# Patient Record
Sex: Male | Born: 1945 | Race: Black or African American | Hispanic: No | Marital: Single | Smoking: Never smoker
Health system: Southern US, Community
[De-identification: ages and names within clinical notes are randomized; demographics above are authoritative.]

## PROBLEM LIST (undated history)

## (undated) DIAGNOSIS — R1310 Dysphagia, unspecified: Secondary | ICD-10-CM

## (undated) DIAGNOSIS — N289 Disorder of kidney and ureter, unspecified: Secondary | ICD-10-CM

## (undated) DIAGNOSIS — F209 Schizophrenia, unspecified: Secondary | ICD-10-CM

## (undated) DIAGNOSIS — H269 Unspecified cataract: Secondary | ICD-10-CM

## (undated) DIAGNOSIS — J189 Pneumonia, unspecified organism: Secondary | ICD-10-CM

## (undated) DIAGNOSIS — R001 Bradycardia, unspecified: Secondary | ICD-10-CM

## (undated) DIAGNOSIS — G8250 Quadriplegia, unspecified: Secondary | ICD-10-CM

## (undated) DIAGNOSIS — I10 Essential (primary) hypertension: Secondary | ICD-10-CM

## (undated) HISTORY — PX: OTHER SURGICAL HISTORY: SHX169

---

## 2001-03-15 ENCOUNTER — Ambulatory Visit (HOSPITAL_COMMUNITY): Admission: RE | Admit: 2001-03-15 | Discharge: 2001-03-15 | Payer: Self-pay | Admitting: Internal Medicine

## 2001-03-15 ENCOUNTER — Encounter: Payer: Self-pay | Admitting: Internal Medicine

## 2008-05-09 ENCOUNTER — Ambulatory Visit (HOSPITAL_COMMUNITY): Admission: RE | Admit: 2008-05-09 | Discharge: 2008-05-09 | Payer: Self-pay | Admitting: Gastroenterology

## 2008-05-09 ENCOUNTER — Ambulatory Visit: Payer: Self-pay | Admitting: Gastroenterology

## 2011-02-02 NOTE — Op Note (Signed)
NAME:  COMPTON, BRIGANCE NO.:  [id number]   MEDICAL RECORD NO.:  [id number]          PATIENT TYPE:  AMB   LOCATION:  DAY                           FACILITY:  [facility]   PHYSICIAN:  Kassie Mends, M.D.      DATE OF BIRTH:  1946/02/15   DATE OF PROCEDURE:  05/09/2008  DATE OF DISCHARGE:                               OPERATIVE REPORT   REFERRING PHYSICIAN:  Tesfaye D. Felecia Shelling, MD.   PROCEDURE:  Colonoscopy.   INDICATION FOR EXAM:  Dean Oliver is a 65 year old male who presents for  average risk colon cancer screening.   FINDINGS:  Extremely tortuous colon requiring multiple changes in  position and pressure to achieve successful intubation of the cecum.  Otherwise no polyps, masses, inflammatory changes, diverticula, or AVMs  seen.  Normal retroflexed view of the rectum.   RECOMMENDATIONS:  1. Screening colonoscopy in 10 years.  2. He should follow a high-fiber diet.  He is given a handout on high-      fiber diet.   MEDICATIONS:  1. Demerol 75 mg IV.  2. Versed 4 mg IV.   PROCEDURE TECHNIQUE:  Physical exam was performed.  Informed consent was  obtained from the patient.  I explained the benefits, risks, and  alternatives to the procedure.  The patient was connected to the monitor  and placed in the left lateral position.  Continuous oxygen was provided  by nasal cannula, and IV medicine administered through an indwelling  cannula.  After administration of sedation and rectal exam, the  patient's rectum was intubated  and the scope was advanced under direct visualization to the cecum.  The  scope was removed slowly by carefully examining the color, texture,  anatomy, and integrity of the mucosa on the way out.  The patient was  recovered in endoscopy and discharged home in satisfactory condition.      Kassie Mends, M.D.  Electronically Signed     SM/MEDQ  D:  05/09/2008  T:  05/09/2008  Job:  [id number]   cc:   Tesfaye D. Felecia Shelling, MD  Fax: [phone]

## 2015-03-31 DIAGNOSIS — F209 Schizophrenia, unspecified: Secondary | ICD-10-CM | POA: Diagnosis not present

## 2015-03-31 DIAGNOSIS — M549 Dorsalgia, unspecified: Secondary | ICD-10-CM | POA: Diagnosis not present

## 2015-03-31 DIAGNOSIS — R569 Unspecified convulsions: Secondary | ICD-10-CM | POA: Diagnosis not present

## 2015-03-31 DIAGNOSIS — I10 Essential (primary) hypertension: Secondary | ICD-10-CM | POA: Diagnosis not present

## 2015-04-08 DIAGNOSIS — H4011X3 Primary open-angle glaucoma, severe stage: Secondary | ICD-10-CM | POA: Diagnosis not present

## 2015-04-08 DIAGNOSIS — H2523 Age-related cataract, morgagnian type, bilateral: Secondary | ICD-10-CM | POA: Diagnosis not present

## 2015-06-30 DIAGNOSIS — Z23 Encounter for immunization: Secondary | ICD-10-CM | POA: Diagnosis not present

## 2015-06-30 DIAGNOSIS — F209 Schizophrenia, unspecified: Secondary | ICD-10-CM | POA: Diagnosis not present

## 2015-06-30 DIAGNOSIS — E785 Hyperlipidemia, unspecified: Secondary | ICD-10-CM | POA: Diagnosis not present

## 2015-06-30 DIAGNOSIS — I10 Essential (primary) hypertension: Secondary | ICD-10-CM | POA: Diagnosis not present

## 2015-06-30 DIAGNOSIS — R569 Unspecified convulsions: Secondary | ICD-10-CM | POA: Diagnosis not present

## 2015-07-15 DIAGNOSIS — H401133 Primary open-angle glaucoma, bilateral, severe stage: Secondary | ICD-10-CM | POA: Diagnosis not present

## 2015-10-07 DIAGNOSIS — I10 Essential (primary) hypertension: Secondary | ICD-10-CM | POA: Diagnosis not present

## 2015-10-07 DIAGNOSIS — F209 Schizophrenia, unspecified: Secondary | ICD-10-CM | POA: Diagnosis not present

## 2015-10-07 DIAGNOSIS — R569 Unspecified convulsions: Secondary | ICD-10-CM | POA: Diagnosis not present

## 2016-01-19 DIAGNOSIS — R634 Abnormal weight loss: Secondary | ICD-10-CM | POA: Diagnosis not present

## 2016-01-19 DIAGNOSIS — F209 Schizophrenia, unspecified: Secondary | ICD-10-CM | POA: Diagnosis not present

## 2016-01-19 DIAGNOSIS — E785 Hyperlipidemia, unspecified: Secondary | ICD-10-CM | POA: Diagnosis not present

## 2016-01-19 DIAGNOSIS — Z0000 Encounter for general adult medical examination without abnormal findings: Secondary | ICD-10-CM | POA: Diagnosis not present

## 2016-01-19 DIAGNOSIS — R569 Unspecified convulsions: Secondary | ICD-10-CM | POA: Diagnosis not present

## 2016-01-19 DIAGNOSIS — I10 Essential (primary) hypertension: Secondary | ICD-10-CM | POA: Diagnosis not present

## 2016-02-03 DIAGNOSIS — H401133 Primary open-angle glaucoma, bilateral, severe stage: Secondary | ICD-10-CM | POA: Diagnosis not present

## 2016-04-22 DIAGNOSIS — F209 Schizophrenia, unspecified: Secondary | ICD-10-CM | POA: Diagnosis not present

## 2016-04-22 DIAGNOSIS — R569 Unspecified convulsions: Secondary | ICD-10-CM | POA: Diagnosis not present

## 2016-04-22 DIAGNOSIS — I10 Essential (primary) hypertension: Secondary | ICD-10-CM | POA: Diagnosis not present

## 2016-05-11 DIAGNOSIS — H401133 Primary open-angle glaucoma, bilateral, severe stage: Secondary | ICD-10-CM | POA: Diagnosis not present

## 2016-08-04 DIAGNOSIS — Z23 Encounter for immunization: Secondary | ICD-10-CM | POA: Diagnosis not present

## 2016-08-10 DIAGNOSIS — H401133 Primary open-angle glaucoma, bilateral, severe stage: Secondary | ICD-10-CM | POA: Diagnosis not present

## 2016-08-16 DIAGNOSIS — F209 Schizophrenia, unspecified: Secondary | ICD-10-CM | POA: Diagnosis not present

## 2016-08-16 DIAGNOSIS — I10 Essential (primary) hypertension: Secondary | ICD-10-CM | POA: Diagnosis not present

## 2016-08-16 DIAGNOSIS — R569 Unspecified convulsions: Secondary | ICD-10-CM | POA: Diagnosis not present

## 2016-09-22 DIAGNOSIS — H2513 Age-related nuclear cataract, bilateral: Secondary | ICD-10-CM | POA: Diagnosis not present

## 2016-09-22 DIAGNOSIS — H401133 Primary open-angle glaucoma, bilateral, severe stage: Secondary | ICD-10-CM | POA: Diagnosis not present

## 2016-10-14 DIAGNOSIS — H2513 Age-related nuclear cataract, bilateral: Secondary | ICD-10-CM | POA: Diagnosis not present

## 2016-10-14 DIAGNOSIS — H401133 Primary open-angle glaucoma, bilateral, severe stage: Secondary | ICD-10-CM | POA: Diagnosis not present

## 2016-10-25 DIAGNOSIS — F209 Schizophrenia, unspecified: Secondary | ICD-10-CM | POA: Diagnosis not present

## 2016-10-25 DIAGNOSIS — I10 Essential (primary) hypertension: Secondary | ICD-10-CM | POA: Diagnosis not present

## 2016-10-25 DIAGNOSIS — R569 Unspecified convulsions: Secondary | ICD-10-CM | POA: Diagnosis not present

## 2016-11-11 DIAGNOSIS — H2511 Age-related nuclear cataract, right eye: Secondary | ICD-10-CM | POA: Diagnosis not present

## 2016-11-11 DIAGNOSIS — H401113 Primary open-angle glaucoma, right eye, severe stage: Secondary | ICD-10-CM | POA: Diagnosis not present

## 2017-01-20 DIAGNOSIS — F70 Mild intellectual disabilities: Secondary | ICD-10-CM | POA: Diagnosis not present

## 2017-01-20 DIAGNOSIS — F99 Mental disorder, not otherwise specified: Secondary | ICD-10-CM | POA: Diagnosis not present

## 2017-01-21 DIAGNOSIS — R739 Hyperglycemia, unspecified: Secondary | ICD-10-CM | POA: Diagnosis not present

## 2017-01-21 DIAGNOSIS — I10 Essential (primary) hypertension: Secondary | ICD-10-CM | POA: Diagnosis not present

## 2017-01-21 DIAGNOSIS — Z0000 Encounter for general adult medical examination without abnormal findings: Secondary | ICD-10-CM | POA: Diagnosis not present

## 2017-01-21 DIAGNOSIS — R569 Unspecified convulsions: Secondary | ICD-10-CM | POA: Diagnosis not present

## 2017-01-21 DIAGNOSIS — F0390 Unspecified dementia without behavioral disturbance: Secondary | ICD-10-CM | POA: Diagnosis not present

## 2017-01-21 DIAGNOSIS — F209 Schizophrenia, unspecified: Secondary | ICD-10-CM | POA: Diagnosis not present

## 2017-01-21 DIAGNOSIS — E785 Hyperlipidemia, unspecified: Secondary | ICD-10-CM | POA: Diagnosis not present

## 2017-05-12 DIAGNOSIS — F70 Mild intellectual disabilities: Secondary | ICD-10-CM | POA: Diagnosis not present

## 2017-06-08 DIAGNOSIS — Z961 Presence of intraocular lens: Secondary | ICD-10-CM | POA: Diagnosis not present

## 2017-06-08 DIAGNOSIS — H401133 Primary open-angle glaucoma, bilateral, severe stage: Secondary | ICD-10-CM | POA: Diagnosis not present

## 2017-06-08 DIAGNOSIS — H2512 Age-related nuclear cataract, left eye: Secondary | ICD-10-CM | POA: Diagnosis not present

## 2017-06-10 DIAGNOSIS — G40901 Epilepsy, unspecified, not intractable, with status epilepticus: Secondary | ICD-10-CM | POA: Diagnosis not present

## 2017-06-10 DIAGNOSIS — I10 Essential (primary) hypertension: Secondary | ICD-10-CM | POA: Diagnosis not present

## 2017-06-10 DIAGNOSIS — Z23 Encounter for immunization: Secondary | ICD-10-CM | POA: Diagnosis not present

## 2017-06-10 DIAGNOSIS — F209 Schizophrenia, unspecified: Secondary | ICD-10-CM | POA: Diagnosis not present

## 2017-06-23 DIAGNOSIS — H401133 Primary open-angle glaucoma, bilateral, severe stage: Secondary | ICD-10-CM | POA: Diagnosis not present

## 2017-06-30 DIAGNOSIS — H2512 Age-related nuclear cataract, left eye: Secondary | ICD-10-CM | POA: Diagnosis not present

## 2017-07-28 DIAGNOSIS — F70 Mild intellectual disabilities: Secondary | ICD-10-CM | POA: Diagnosis not present

## 2017-08-12 ENCOUNTER — Emergency Department (HOSPITAL_COMMUNITY): Payer: Medicare Other

## 2017-08-12 ENCOUNTER — Encounter (HOSPITAL_COMMUNITY)
Admission: EM | Disposition: A | Discharge status: Rehabilitation Facility | Payer: Self-pay | Source: Home / Self Care | Attending: Internal Medicine

## 2017-08-12 ENCOUNTER — Encounter (HOSPITAL_COMMUNITY): Payer: Self-pay | Admitting: Emergency Medicine

## 2017-08-12 ENCOUNTER — Inpatient Hospital Stay (HOSPITAL_COMMUNITY)
Admission: EM | Admit: 2017-08-12 | Discharge: 2017-08-17 | DRG: 052 | Disposition: A | Discharge status: Rehabilitation Facility | Payer: Medicare Other | Attending: Internal Medicine | Admitting: Internal Medicine

## 2017-08-12 ENCOUNTER — Inpatient Hospital Stay (HOSPITAL_COMMUNITY): Payer: Medicare Other

## 2017-08-12 DIAGNOSIS — I10 Essential (primary) hypertension: Secondary | ICD-10-CM | POA: Diagnosis not present

## 2017-08-12 DIAGNOSIS — M25552 Pain in left hip: Secondary | ICD-10-CM | POA: Diagnosis not present

## 2017-08-12 DIAGNOSIS — G8250 Quadriplegia, unspecified: Secondary | ICD-10-CM | POA: Diagnosis present

## 2017-08-12 DIAGNOSIS — R001 Bradycardia, unspecified: Secondary | ICD-10-CM | POA: Diagnosis not present

## 2017-08-12 DIAGNOSIS — M25512 Pain in left shoulder: Secondary | ICD-10-CM | POA: Diagnosis not present

## 2017-08-12 DIAGNOSIS — D62 Acute posthemorrhagic anemia: Secondary | ICD-10-CM

## 2017-08-12 DIAGNOSIS — H269 Unspecified cataract: Secondary | ICD-10-CM | POA: Diagnosis present

## 2017-08-12 DIAGNOSIS — N319 Neuromuscular dysfunction of bladder, unspecified: Secondary | ICD-10-CM | POA: Diagnosis present

## 2017-08-12 DIAGNOSIS — S14122A Central cord syndrome at C2 level of cervical spinal cord, initial encounter: Secondary | ICD-10-CM | POA: Diagnosis not present

## 2017-08-12 DIAGNOSIS — E871 Hypo-osmolality and hyponatremia: Secondary | ICD-10-CM | POA: Diagnosis not present

## 2017-08-12 DIAGNOSIS — R279 Unspecified lack of coordination: Secondary | ICD-10-CM | POA: Diagnosis not present

## 2017-08-12 DIAGNOSIS — I959 Hypotension, unspecified: Secondary | ICD-10-CM | POA: Diagnosis not present

## 2017-08-12 DIAGNOSIS — M4802 Spinal stenosis, cervical region: Secondary | ICD-10-CM | POA: Diagnosis present

## 2017-08-12 DIAGNOSIS — Z7401 Bed confinement status: Secondary | ICD-10-CM | POA: Diagnosis not present

## 2017-08-12 DIAGNOSIS — R531 Weakness: Secondary | ICD-10-CM | POA: Diagnosis not present

## 2017-08-12 DIAGNOSIS — M5000 Cervical disc disorder with myelopathy, unspecified cervical region: Secondary | ICD-10-CM | POA: Diagnosis present

## 2017-08-12 DIAGNOSIS — R404 Transient alteration of awareness: Secondary | ICD-10-CM | POA: Diagnosis not present

## 2017-08-12 DIAGNOSIS — F209 Schizophrenia, unspecified: Secondary | ICD-10-CM | POA: Insufficient documentation

## 2017-08-12 DIAGNOSIS — R339 Retention of urine, unspecified: Secondary | ICD-10-CM | POA: Diagnosis not present

## 2017-08-12 DIAGNOSIS — F203 Undifferentiated schizophrenia: Secondary | ICD-10-CM | POA: Diagnosis not present

## 2017-08-12 DIAGNOSIS — W19XXXA Unspecified fall, initial encounter: Secondary | ICD-10-CM | POA: Diagnosis present

## 2017-08-12 DIAGNOSIS — R55 Syncope and collapse: Secondary | ICD-10-CM

## 2017-08-12 DIAGNOSIS — R1310 Dysphagia, unspecified: Secondary | ICD-10-CM

## 2017-08-12 HISTORY — DX: Schizophrenia, unspecified: F20.9

## 2017-08-12 HISTORY — DX: Unspecified cataract: H26.9

## 2017-08-12 HISTORY — DX: Essential (primary) hypertension: I10

## 2017-08-12 LAB — I-STAT TROPONIN, ED: Troponin i, poc: 0 ng/mL (ref 0.00–0.08)

## 2017-08-12 LAB — CBC WITH DIFFERENTIAL/PLATELET
Basophils Absolute: 0 10*3/uL (ref 0.0–0.1)
Basophils Relative: 0 %
EOS ABS: 0 10*3/uL (ref 0.0–0.7)
EOS PCT: 1 %
HCT: 35 % — ABNORMAL LOW (ref 39.0–52.0)
HEMOGLOBIN: 10.8 g/dL — AB (ref 13.0–17.0)
LYMPHS ABS: 1.5 10*3/uL (ref 0.7–4.0)
Lymphocytes Relative: 37 %
MCH: 27.8 pg (ref 26.0–34.0)
MCHC: 30.9 g/dL (ref 30.0–36.0)
MCV: 90 fL (ref 78.0–100.0)
MONO ABS: 0.3 10*3/uL (ref 0.1–1.0)
MONOS PCT: 7 %
NEUTROS PCT: 55 %
Neutro Abs: 2.3 10*3/uL (ref 1.7–7.7)
Platelets: 220 10*3/uL (ref 150–400)
RBC: 3.89 MIL/uL — ABNORMAL LOW (ref 4.22–5.81)
RDW: 13.2 % (ref 11.5–15.5)
WBC: 4.1 10*3/uL (ref 4.0–10.5)

## 2017-08-12 LAB — COMPREHENSIVE METABOLIC PANEL
ALK PHOS: 73 U/L (ref 38–126)
ALT: 13 U/L — AB (ref 17–63)
AST: 20 U/L (ref 15–41)
Albumin: 3.8 g/dL (ref 3.5–5.0)
Anion gap: 5 (ref 5–15)
BUN: 17 mg/dL (ref 6–20)
CALCIUM: 8.9 mg/dL (ref 8.9–10.3)
CO2: 21 mmol/L — AB (ref 22–32)
Chloride: 111 mmol/L (ref 101–111)
Creatinine, Ser: 1.07 mg/dL (ref 0.61–1.24)
GFR calc Af Amer: >60 mL/min (ref >60)
GFR calc non Af Amer: >60 mL/min (ref >60)
GLUCOSE: 116 mg/dL — AB (ref 65–99)
Potassium: 3.3 mmol/L — ABNORMAL LOW (ref 3.5–5.1)
SODIUM: 137 mmol/L (ref 135–145)
Total Bilirubin: 0.6 mg/dL (ref 0.3–1.2)
Total Protein: 6.7 g/dL (ref 6.5–8.1)

## 2017-08-12 LAB — TROPONIN I: Troponin I: <0.03 ng/mL (ref <0.03)

## 2017-08-12 LAB — I-STAT CHEM 8, ED
BUN: 15 mg/dL (ref 6–20)
CALCIUM ION: 1.04 mmol/L — AB (ref 1.15–1.40)
CHLORIDE: 113 mmol/L — AB (ref 101–111)
Creatinine, Ser: 1 mg/dL (ref 0.61–1.24)
GLUCOSE: 125 mg/dL — AB (ref 65–99)
HCT: 29 % — ABNORMAL LOW (ref 39.0–52.0)
Hemoglobin: 9.9 g/dL — ABNORMAL LOW (ref 13.0–17.0)
Potassium: 3.8 mmol/L (ref 3.5–5.1)
Sodium: 139 mmol/L (ref 135–145)
TCO2: 17 mmol/L — ABNORMAL LOW (ref 22–32)

## 2017-08-12 LAB — I-STAT CG4 LACTIC ACID, ED: LACTIC ACID, VENOUS: 1.42 mmol/L (ref 0.5–1.9)

## 2017-08-12 LAB — CORTISOL: CORTISOL PLASMA: 25.5 ug/dL

## 2017-08-12 LAB — CBC
HCT: 33.1 % — ABNORMAL LOW (ref 39.0–52.0)
Hemoglobin: 10.6 g/dL — ABNORMAL LOW (ref 13.0–17.0)
MCH: 27.6 pg (ref 26.0–34.0)
MCHC: 32 g/dL (ref 30.0–36.0)
MCV: 86.2 fL (ref 78.0–100.0)
PLATELETS: 224 10*3/uL (ref 150–400)
RBC: 3.84 MIL/uL — ABNORMAL LOW (ref 4.22–5.81)
RDW: 13 % (ref 11.5–15.5)
WBC: 7.1 10*3/uL (ref 4.0–10.5)

## 2017-08-12 LAB — TSH: TSH: 4.926 u[IU]/mL — AB (ref 0.350–4.500)

## 2017-08-12 LAB — CREATININE, SERUM
CREATININE: 0.94 mg/dL (ref 0.61–1.24)
GFR calc Af Amer: >60 mL/min (ref >60)
GFR calc non Af Amer: >60 mL/min (ref >60)

## 2017-08-12 LAB — MRSA PCR SCREENING: MRSA by PCR: NEGATIVE

## 2017-08-12 SURGERY — TEMPORARY PACEMAKER
Anesthesia: LOCAL

## 2017-08-12 MED ORDER — NOREPINEPHRINE BITARTRATE 1 MG/ML IV SOLN
0.0000 ug/min | INTRAVENOUS | Status: DC
  Administered 2017-08-12 (×2): 9 ug/min via INTRAVENOUS
  Administered 2017-08-13: 10 ug/min via INTRAVENOUS
  Filled 2017-08-12 (×2): qty 4

## 2017-08-12 MED ORDER — HEPARIN SODIUM (PORCINE) 5000 UNIT/ML IJ SOLN
5000.0000 [IU] | Freq: Three times a day (TID) | INTRAMUSCULAR | Status: DC
  Administered 2017-08-12 – 2017-08-17 (×15): 5000 [IU] via SUBCUTANEOUS
  Filled 2017-08-12 (×15): qty 1

## 2017-08-12 MED ORDER — ONDANSETRON HCL 4 MG/2ML IJ SOLN: 4.0000 mg | Freq: Four times a day (QID) | INTRAMUSCULAR | Status: DC | PRN

## 2017-08-12 MED ORDER — ASPIRIN 81 MG PO CHEW
324.0000 mg | CHEWABLE_TABLET | ORAL | Status: AC
  Administered 2017-08-12: 324 mg via ORAL
  Filled 2017-08-12: qty 4

## 2017-08-12 MED ORDER — ASPIRIN EC 81 MG PO TBEC
81.0000 mg | DELAYED_RELEASE_TABLET | Freq: Every day | ORAL | Status: DC
  Administered 2017-08-13 – 2017-08-17 (×5): 81 mg via ORAL
  Filled 2017-08-12 (×5): qty 1

## 2017-08-12 MED ORDER — ATROPINE SULFATE 1 MG/ML IJ SOLN
INTRAMUSCULAR | Status: AC | PRN
  Administered 2017-08-12: 1 mg via INTRAVENOUS

## 2017-08-12 MED ORDER — NITROGLYCERIN 0.4 MG SL SUBL: 0.4000 mg | SUBLINGUAL_TABLET | SUBLINGUAL | Status: DC | PRN

## 2017-08-12 MED ORDER — ATROPINE SULFATE 1 MG/10ML IJ SOSY
PREFILLED_SYRINGE | INTRAMUSCULAR | Status: AC
  Administered 2017-08-12: 1 mg
  Filled 2017-08-12: qty 10

## 2017-08-12 MED ORDER — ACETAMINOPHEN 325 MG PO TABS
650.0000 mg | ORAL_TABLET | ORAL | Status: DC | PRN
  Administered 2017-08-12 – 2017-08-17 (×6): 650 mg via ORAL
  Filled 2017-08-12 (×6): qty 2

## 2017-08-12 MED ORDER — ALPRAZOLAM 0.25 MG PO TABS: 0.2500 mg | ORAL_TABLET | Freq: Two times a day (BID) | ORAL | Status: DC | PRN

## 2017-08-12 MED ORDER — ZOLPIDEM TARTRATE 5 MG PO TABS: 5.0000 mg | ORAL_TABLET | Freq: Every evening | ORAL | Status: DC | PRN

## 2017-08-12 MED ORDER — ASPIRIN 300 MG RE SUPP: 300.0000 mg | RECTAL | Status: AC

## 2017-08-12 MED ORDER — SODIUM CHLORIDE 0.9 % IV BOLUS (SEPSIS)
1000.0000 mL | Freq: Once | INTRAVENOUS | Status: AC
  Administered 2017-08-12: 1000 mL via INTRAVENOUS

## 2017-08-12 NOTE — Progress Notes (Signed)
   08/12/17 1800  Clinical Encounter Type  Visited With Family  Visit Type Initial  Consult/Referral To Chaplain  Spiritual Encounters  Spiritual Needs Prayer  Stress Factors  Patient Stress Factors None identified  Family Stress Factors Health changes  Chaplain met the family in the ED as they were making their way back to trauma C prior to PT being moved.  Chaplain spoke with doctor and informed family there presence in the room was ok.  Chaplain prayed for the PT and Family

## 2017-08-12 NOTE — ED Notes (Signed)
Levophed started at 10mcg/min

## 2017-08-12 NOTE — ED Notes (Signed)
Levophed rate changed from 49mcg/min to 81mcg/min per Dr Harrington Challenger at bedside

## 2017-08-12 NOTE — ED Notes (Signed)
Pt unable to sign for self at this time.

## 2017-08-12 NOTE — ED Notes (Signed)
ICU RN Evelena Peat riding with pt to [facility].  Good capture maintained.

## 2017-08-12 NOTE — ED Notes (Signed)
Pacing stopped with Barrett, Rhonda at bedside to obtain intrinsic EKG; HR maintained at 73 bpm; pt remains alert and oriented; EKG obtained

## 2017-08-12 NOTE — H&P (Signed)
Cardiology Consultation:   Patient ID: Dean Oliver; [id number]; 1945-09-27   Admit date: 08/12/2017 Date of Consult: 08/12/2017  Primary Care Provider: Dr Legrand Rams Primary Cardiologist: New Primary Electrophysiologist:  New   Patient Profile:   Dean Oliver is a 71 y.o. male with a hx of schizophrenia, HTN, cataracts, who is being seen today for the evaluation of bradycardia at the request of Dr .Gilford Raid.  History of Present Illness:   Dean. Oliver is a 71 yo who lives in a group home  Over the past couple days he says he has been dizzy  Passed out  Details not clear  Patient is a difficult historian  He deneis CP  No N/V  Occasional loose stool  Slightly feverish at times  Family member is here  She says that pt got picked up yesterday at about 10 am for Thanksgiving dinner  Stayed until about 10 pm  One family member says he wasn't himself    Past Medical History:  Diagnosis Date  . Cataracts, bilateral   . HTN (hypertension)   . Schizophrenia [facility])     Past Surgical History:  Procedure Laterality Date  . None       Prior to Admission medications   Not on File    Inpatient Medications: Scheduled Meds:  Continuous Infusions:  PRN Meds:   Allergies:   No Known Allergies  Social History:   Social History   Socioeconomic History  . Marital status: Single    Spouse name: Not on file  . Number of children: Not on file  . Years of education: Not on file  . Highest education level: Not on file  Social Needs  . Financial resource strain: Not on file  . Food insecurity - worry: Not on file  . Food insecurity - inability: Not on file  . Transportation needs - medical: Not on file  . Transportation needs - non-medical: Not on file  Occupational History  . Occupation: Disabled  Tobacco Use  . Smoking status: Unknown If Ever Smoked  Substance and Sexual Activity  . Alcohol use: Not on file  . Drug use: Not on file  . Sexual activity: Not on file  Other  Topics Concern  . Not on file  Social History Narrative   Dean Dean Oliver (uncle) is his POA.    Pt lives in [facility], [phone].    Family History:   No family history on file. Family Status:  Family Status  Relation Name Status  . Mother  Deceased  . Father  Deceased  . PGM  Deceased  . PGF  Deceased    ROS:  Please see the history of present illness.  All other ROS reviewed and negative.     Physical Exam/Data:   Vitals:   08/12/17 1748 08/12/17 1752 08/12/17 1755 08/12/17 1800  BP: 101/66 110/68 105/67 98/67  Pulse: 72 71 73 74  Resp: 18 16 16 14   SpO2: 100% 100% 100% 99%    Intake/Output Summary (Last 24 hours) at 08/12/2017 1810 Last data filed at 08/12/2017 1623 Gross per 24 hour  Intake 1000 ml  Output -  Net 1000 ml   There were no vitals filed for this visit. There is no height or weight on file to calculate BMI.   BP 110/70  Pulse 72  SR   General:  Well nourished, well developed, in no acute distress HEENT: normal  Adentulous  Lymph: no adenopathy Neck: no  JVD  No bruits   Endocrine:  No thryomegaly Vascular: No carotid bruits; 4/4 extremity pulses 2+, without bruits  Cardiac:  normal S1, S2; RRR; no murmur  Lungs:  clear to auscultation bilaterally, no wheezing, rhonchi or rales  Abd: soft, nontender, no hepatomegaly  Ext: no edema  Feet warm   Musculoskeletal:  No deformities, BUE and BLE strength normal and equal Skin: warm and dry  Neuro:  CNs 2-12 intact, no focal abnormalities noted Psych:  Normal affect   EKG:  The EKG was personally reviewed and demonstrates:  S Initial EKG  SB 38 bpm  PR interval 200 msec  QRS 102 msec  Q waves V1, Second EKG (on Levophed)  SR 73 bpm   Telemetry:  Telemetry was personally reviewed and demonstrates:  SR       Laboratory Data:  Chemistry Recent Labs  Lab 08/12/17 1547 08/12/17 1621  NA 137 139  K 3.3* 3.8  CL 111 113*  CO2 21*  --   GLUCOSE 116* 125*  BUN 17 15   CREATININE 1.07 1.00  CALCIUM 8.9  --   GFRNONAA >60  --   GFRAA >60  --   ANIONGAP 5  --     Total Protein  Date Value Ref Range Status  08/12/2017 6.7 6.5 - 8.1 g/dL Final   Albumin  Date Value Ref Range Status  08/12/2017 3.8 3.5 - 5.0 g/dL Final   AST  Date Value Ref Range Status  08/12/2017 20 15 - 41 U/L Final   ALT  Date Value Ref Range Status  08/12/2017 13 (L) 17 - 63 U/L Final   Alkaline Phosphatase  Date Value Ref Range Status  08/12/2017 73 38 - 126 U/L Final   Total Bilirubin  Date Value Ref Range Status  08/12/2017 0.6 0.3 - 1.2 mg/dL Final   Hematology Recent Labs  Lab 08/12/17 1547 08/12/17 1621  WBC 4.1  --   RBC 3.89*  --   HGB 10.8* 9.9*  HCT 35.0* 29.0*  MCV 90.0  --   MCH 27.8  --   MCHC 30.9  --   RDW 13.2  --   PLT 220  --    Cardiac Enzymes Recent Labs  Lab 08/12/17 1547  TROPONINI <0.03    Recent Labs  Lab 08/12/17 1619  TROPIPOC 0.00    BNPNo results for input(s): BNP, PROBNP in the last 168 hours.  DDimer No results for input(s): DDIMER in the last 168 hours. TSH:  Lab Results  Component Value Date   TSH 4.926 (H) 08/12/2017   Lipids:No results found for: CHOL, HDL, LDLCALC, LDLDIRECT, TRIG, CHOLHDL HgbA1c:No results found for: HGBA1C  Radiology/Studies:  Dg Chest Portable 1 View  Result Date: 08/12/2017 CLINICAL DATA:  Hypotension. EXAM: PORTABLE CHEST 1 VIEW COMPARISON:  None. FINDINGS: Mild cardiomegaly is noted. No pneumothorax or pleural effusion is noted. Both lungs are clear. The visualized skeletal structures are unremarkable. IMPRESSION: No acute cardiopulmonary abnormality seen. Electronically Signed   By: Marijo Conception, M.D.   On: 08/12/2017 16:44    Assessment and Plan:   1.  Bradycardia  Pt with narrow complex QRS  Upper normal PR interval  Hypotension appears out of proportion to initial HR  AND hypotension and HR improved with Levophed  (would expect some lowering of HR, not an  increase) COnfusing    No history of any drug use   Called facility  They denied any mixup   UA pending  Will check blood cults  Check cortisol   TSH minimally elevated  Will plan to admit to ICU   Continue current IV pressors and try to wean  If bradycardia returns with hypotension consider PPM   2  Hypotension  As above    3  Hx hypertension  Hold meds    4  Hx schizophrenia  Review meds    Active Problems:   Symptomatic bradycardia     For questions or updates, please contact [facility] Please consult [email or web address] for contact info under Cardiology/STEMI.   Signed, Dorris Carnes, MD  08/12/2017 6:10 PM

## 2017-08-12 NOTE — ED Notes (Signed)
Attempted to locate next of kin to notify with no success.  Pt is alert and questionably oriented at this time.  Sometimes pt answers questions correctly and sometimes not.  Pt is very difficult to understand at times.

## 2017-08-12 NOTE — ED Notes (Signed)
[facility] has no available truck.  RN and ems will transport pt ED to ED.

## 2017-08-12 NOTE — ED Triage Notes (Signed)
Patient from [facility]. Per ems patient is lethargic, synus brady, hypotension. [facility] states that patient had syncopal episode prior to ems arrival.

## 2017-08-12 NOTE — ED Provider Notes (Signed)
[facility] EMERGENCY DEPARTMENT Provider Note   CSN: [id number] Arrival date & time:        History   Chief Complaint Chief Complaint  Patient presents with  . Bradycardia  . Hypotension    HPI Dean Oliver is a 71 y.o. male.  Pt presents to the ED today from [facility].  The pt had a syncopal episode pta.  When EMS arrived, his BP was in the 50s and his HR was in the 30s.  EMS started an IV and came straight here.  The facility is close, so no other interventions were done.  The pt is unable to give any hx.      History reviewed. No pertinent past medical history.  There are no active problems to display for this patient.   History reviewed. No pertinent surgical history.     Home Medications    Prior to Admission medications   Not on File    Family History No family history on file.  Social History Social History   Tobacco Use  . Smoking status: Unknown If Ever Smoked  Substance Use Topics  . Alcohol use: Not on file  . Drug use: Not on file     Allergies   Patient has no known allergies.   Review of Systems Review of Systems  All other systems reviewed and are negative.    Physical Exam Updated Vital Signs BP 110/69   Pulse 72   Resp 19   SpO2 (!) 86%   Physical Exam  Constitutional: He appears well-developed. He appears distressed.  HENT:  Head: Normocephalic and atraumatic.  Right Ear: External ear normal.  Left Ear: External ear normal.  Nose: Nose normal.  Mouth/Throat: Oropharynx is clear and moist.  Eyes: Conjunctivae and EOM are normal. Pupils are equal, round, and reactive to light.  Neck: Normal range of motion. Neck supple.  Cardiovascular: Normal heart sounds. Bradycardia present.  Pulmonary/Chest: Effort normal and breath sounds normal.  Abdominal: Soft. Bowel sounds are normal.  Musculoskeletal: Normal range of motion.  Neurological:  Pt is awake, but somewhat lethargic.  Skin: Skin is warm.  Nursing  note and vitals reviewed.    ED Treatments / Results  Labs (all labs ordered are listed, but only abnormal results are displayed) Labs Reviewed  COMPREHENSIVE METABOLIC PANEL - Abnormal; Notable for the following components:      Result Value   Potassium 3.3 (*)    CO2 21 (*)    Glucose, Bld 116 (*)    ALT 13 (*)    All other components within normal limits  CBC WITH DIFFERENTIAL/PLATELET - Abnormal; Notable for the following components:   RBC 3.89 (*)    Hemoglobin 10.8 (*)    HCT 35.0 (*)    All other components within normal limits  TSH - Abnormal; Notable for the following components:   TSH 4.926 (*)    All other components within normal limits  I-STAT CHEM 8, ED - Abnormal; Notable for the following components:   Chloride 113 (*)    Glucose, Bld 125 (*)    Calcium, Ion 1.04 (*)    TCO2 17 (*)    Hemoglobin 9.9 (*)    HCT 29.0 (*)    All other components within normal limits  TROPONIN I  URINALYSIS, ROUTINE W REFLEX MICROSCOPIC  I-STAT CG4 LACTIC ACID, ED  I-STAT TROPONIN, ED    EKG  EKG Interpretation  Date/Time:  Friday August 12 2017 15:37:52 EST  Ventricular Rate:  38 PR Interval:    QRS Duration: 102 QT Interval:  606 QTC Calculation: 482 R Axis:   47 Text Interpretation:  Sinus bradycardia Borderline ST elevation, anterior leads Borderline prolonged QT interval Confirmed by Isla Pence [phone]) on 08/12/2017 4:01:49 PM       Radiology Dg Chest Portable 1 View  Result Date: 08/12/2017 CLINICAL DATA:  Hypotension. EXAM: PORTABLE CHEST 1 VIEW COMPARISON:  None. FINDINGS: Mild cardiomegaly is noted. No pneumothorax or pleural effusion is noted. Both lungs are clear. The visualized skeletal structures are unremarkable. IMPRESSION: No acute cardiopulmonary abnormality seen. Electronically Signed   By: Marijo Conception, M.D.   On: 08/12/2017 16:44    Procedures Procedures (including critical care time)  Medications Ordered in ED Medications    atropine 1 MG/10ML injection (1 mg  Given 08/12/17 1539)  sodium chloride 0.9 % bolus 1,000 mL (0 mLs Intravenous Stopped 08/12/17 1623)  atropine injection (1 mg Intravenous Given 08/12/17 1534)     Initial Impression / Assessment and Plan / ED Course  I have reviewed the triage vital signs and the nursing notes.  Pertinent labs & imaging results that were available during my care of the patient were reviewed by me and considered in my medical decision making (see chart for details).  Pt does not take any medications that lower BP or HR.    Pt immediately given 1 amp atropine without any change in HR or BP.  Pt placed on transdermal pacer with good capture verified by radial pulse.  BP is slowly coming up.  The pt is becoming more alert.  Unfortunately, there is no way to get a pacemaker here at [facility].  Pt d/w Dr. Harrington Challenger (cardiology) at [facility].  She accepted him for transfer.  I attempted to put a bed request in for patient, but the computer would not allow it.  Another provider also tried, and the same message appeared to her.  Due to the need for patient to get to [facility], she was d/w Dr. Eulis Foster in the ED who accepted him for transfer there until he can be seen by the cardiologist and pacemaker placed.  CRITICAL CARE Performed by: Isla Pence   Total critical care time: 30 minutes  Critical care time was exclusive of separately billable procedures and treating other patients.  Critical care was necessary to treat or prevent imminent or life-threatening deterioration.  Critical care was time spent personally by me on the following activities: development of treatment plan with patient and/or surrogate as well as nursing, discussions with consultants, evaluation of patient's response to treatment, examination of patient, obtaining history from patient or surrogate, ordering and performing treatments and interventions, ordering and review of laboratory studies, ordering and review of  radiographic studies, pulse oximetry and re-evaluation of patient's condition.  Final Clinical Impressions(s) / ED Diagnoses   Final diagnoses:  Sinus bradycardia  Symptomatic bradycardia  Hypotension, unspecified hypotension type  Syncope, unspecified syncope type    ED Discharge Orders    None       Isla Pence, MD 08/12/17 1650

## 2017-08-12 NOTE — ED Notes (Signed)
External pacer applied with capture obtained at 68mA.  Pt tolerating well.  Verified by radial pulse.

## 2017-08-12 NOTE — Progress Notes (Signed)
On assessment, RN noted pt has unequal pupils, the R is larger (4-25mm) and irregularly shaped, non-reactive, L is 2-89mm and reacts appropriately to light, pt does have noted history of cataracts bilaterally. Pt is also complaining of left hip and shoulder/arm soreness and states he has fallen twice in the past two days, family at bedside confirms this. Pt has pronounced weakness, bilaterally, but weaker on the left side, see flowsheets for assessment. Dr. Lamona Curl paged and made aware of findings, orders for xrays at this time. Per MD, think weakness is fall and HR related, so no CT orders at this time. Will continue to monitor closely.

## 2017-08-12 NOTE — ED Notes (Signed)
Radial pulse remains present and strong.

## 2017-08-12 NOTE — ED Notes (Addendum)
Pt arrived to [facility] ED via [place] with [facility] ED RN being ex paced at 70 bpm and capture at 76 mA; pt alert and oriented; 911 was called for dizziness and possible syncopal episode at nursing home; pt found to be bradycardic with HR in 30s and hypotensive; EMS began pacing and [facility] ED started patient on Levophed (currently at 20mcg/min); cardiology PA at bedside

## 2017-08-13 ENCOUNTER — Encounter (HOSPITAL_COMMUNITY): Payer: Self-pay | Admitting: Neurological Surgery

## 2017-08-13 ENCOUNTER — Inpatient Hospital Stay (HOSPITAL_COMMUNITY): Payer: Medicare Other

## 2017-08-13 DIAGNOSIS — G8250 Quadriplegia, unspecified: Secondary | ICD-10-CM | POA: Diagnosis not present

## 2017-08-13 DIAGNOSIS — R29818 Other symptoms and signs involving the nervous system: Secondary | ICD-10-CM | POA: Diagnosis not present

## 2017-08-13 DIAGNOSIS — R001 Bradycardia, unspecified: Secondary | ICD-10-CM | POA: Diagnosis not present

## 2017-08-13 DIAGNOSIS — I959 Hypotension, unspecified: Secondary | ICD-10-CM

## 2017-08-13 DIAGNOSIS — S14129A Central cord syndrome at unspecified level of cervical spinal cord, initial encounter: Secondary | ICD-10-CM | POA: Diagnosis not present

## 2017-08-13 DIAGNOSIS — E871 Hypo-osmolality and hyponatremia: Secondary | ICD-10-CM | POA: Diagnosis not present

## 2017-08-13 DIAGNOSIS — S0990XA Unspecified injury of head, initial encounter: Secondary | ICD-10-CM | POA: Diagnosis not present

## 2017-08-13 DIAGNOSIS — D62 Acute posthemorrhagic anemia: Secondary | ICD-10-CM | POA: Diagnosis not present

## 2017-08-13 DIAGNOSIS — M5000 Cervical disc disorder with myelopathy, unspecified cervical region: Secondary | ICD-10-CM | POA: Diagnosis not present

## 2017-08-13 DIAGNOSIS — S14109A Unspecified injury at unspecified level of cervical spinal cord, initial encounter: Secondary | ICD-10-CM | POA: Diagnosis not present

## 2017-08-13 DIAGNOSIS — S199XXA Unspecified injury of neck, initial encounter: Secondary | ICD-10-CM | POA: Diagnosis not present

## 2017-08-13 DIAGNOSIS — S14122A Central cord syndrome at C2 level of cervical spinal cord, initial encounter: Secondary | ICD-10-CM | POA: Diagnosis not present

## 2017-08-13 LAB — URINALYSIS, ROUTINE W REFLEX MICROSCOPIC
BACTERIA UA: NONE SEEN
Bilirubin Urine: NEGATIVE
GLUCOSE, UA: NEGATIVE mg/dL
Hgb urine dipstick: NEGATIVE
Ketones, ur: NEGATIVE mg/dL
Leukocytes, UA: NEGATIVE
Nitrite: NEGATIVE
PH: 5 (ref 5.0–8.0)
PROTEIN: NEGATIVE mg/dL
SQUAMOUS EPITHELIAL / LPF: NONE SEEN
Specific Gravity, Urine: 1.014 (ref 1.005–1.030)

## 2017-08-13 LAB — CBC
HEMATOCRIT: 32.4 % — AB (ref 39.0–52.0)
HEMOGLOBIN: 10.7 g/dL — AB (ref 13.0–17.0)
MCH: 28.2 pg (ref 26.0–34.0)
MCHC: 33 g/dL (ref 30.0–36.0)
MCV: 85.5 fL (ref 78.0–100.0)
Platelets: 205 10*3/uL (ref 150–400)
RBC: 3.79 MIL/uL — AB (ref 4.22–5.81)
RDW: 13.2 % (ref 11.5–15.5)
WBC: 6.4 10*3/uL (ref 4.0–10.5)

## 2017-08-13 LAB — BASIC METABOLIC PANEL
Anion gap: 6 (ref 5–15)
BUN: 12 mg/dL (ref 6–20)
CALCIUM: 8.1 mg/dL — AB (ref 8.9–10.3)
CO2: 18 mmol/L — ABNORMAL LOW (ref 22–32)
Chloride: 113 mmol/L — ABNORMAL HIGH (ref 101–111)
Creatinine, Ser: 0.76 mg/dL (ref 0.61–1.24)
GFR calc Af Amer: >60 mL/min (ref >60)
GLUCOSE: 112 mg/dL — AB (ref 65–99)
Potassium: 3.1 mmol/L — ABNORMAL LOW (ref 3.5–5.1)
Sodium: 137 mmol/L (ref 135–145)

## 2017-08-13 LAB — PROTIME-INR
INR: 1.27
Prothrombin Time: 15.7 seconds — ABNORMAL HIGH (ref 11.4–15.2)

## 2017-08-13 LAB — TROPONIN I: Troponin I: <0.03 ng/mL (ref <0.03)

## 2017-08-13 LAB — APTT: aPTT: 34 seconds (ref 24–36)

## 2017-08-13 LAB — GLUCOSE, CAPILLARY: GLUCOSE-CAPILLARY: 115 mg/dL — AB (ref 65–99)

## 2017-08-13 LAB — ECHOCARDIOGRAM COMPLETE

## 2017-08-13 MED ORDER — DEXAMETHASONE 4 MG PO TABS
4.0000 mg | ORAL_TABLET | Freq: Four times a day (QID) | ORAL | Status: DC
  Filled 2017-08-13: qty 1

## 2017-08-13 MED ORDER — SODIUM CHLORIDE 0.9 % IV BOLUS (SEPSIS)
1000.0000 mL | INTRAVENOUS | Status: AC
  Administered 2017-08-13: 1000 mL via INTRAVENOUS

## 2017-08-13 MED ORDER — DEXAMETHASONE SODIUM PHOSPHATE 10 MG/ML IJ SOLN
10.0000 mg | Freq: Once | INTRAMUSCULAR | Status: AC
  Administered 2017-08-13: 10 mg via INTRAVENOUS
  Filled 2017-08-13: qty 1

## 2017-08-13 MED ORDER — SODIUM CHLORIDE 0.9 % IV SOLN
INTRAVENOUS | Status: DC
  Administered 2017-08-13 – 2017-08-17 (×5): via INTRAVENOUS

## 2017-08-13 MED ORDER — NOREPINEPHRINE BITARTRATE 1 MG/ML IV SOLN
0.0000 ug/min | INTRAVENOUS | Status: DC
  Administered 2017-08-13: 9 ug/min via INTRAVENOUS
  Filled 2017-08-13 (×2): qty 16

## 2017-08-13 MED ORDER — POTASSIUM CHLORIDE CRYS ER 20 MEQ PO TBCR
40.0000 meq | EXTENDED_RELEASE_TABLET | Freq: Two times a day (BID) | ORAL | Status: DC
  Administered 2017-08-13 – 2017-08-17 (×9): 40 meq via ORAL
  Filled 2017-08-13 (×9): qty 2

## 2017-08-13 NOTE — Progress Notes (Addendum)
S/O: No further improvement of arm or leg strength. CT c-spine completed.  BP 91/60   Pulse (!) 54   Temp 98.3 F (36.8 C)   Resp 18   SpO2 98%   Mental Status: Awake and alert. Answers questions and follows commands.  Cranial Nerves: No facial droop. Mildly hypophonic speech. Head at [place]. Mild dysarthria in context of missing dentition.Weak shoulder shrug at 4-/5 bilaterally.  Motor: RUE: Flaccid tone. 2/5 low amplitude adduction at shoulder. 0/5 shoulder abduction and biceps. Trace triceps. Wrist extension/flexion 0/5. Finger flexors/extensors/intrinsics 0/5.  LUE: Flaccid tone. 2/5 low amplitude adduction at shoulder. 0/5 shoulder abduction. Trace biceps and triceps. Wrist extension/flexion 0/5. Finger flexors/extensors/intrinsics 0/5.  RLE: 0/5 hip flexion, 3/5 knee extension, 0/5 ankle plantar/dorsiflexion LLE: 0/5 hip flexion, 3/5 knee extension, 0/5 ankle plantar/dorsiflexion Sensory: Absent FT sensation to forearms bilaterally; trace FT sensation to hands bilaterally. Pain sensation intact to forearms bilaterally. Absent proprioception to arms bilaterally. Intact FT/pressure to lower extremities bilaterally.  Deep Tendon Reflexes:  Trace right biceps, 0 right brachioradialis. 0 right biceps and brachioradialis. 1+ patellae bilaterally. 0 achlles bilaterally.   CT cervical spine:  -No CT evidence of acute fracture malalignment of the cervical spine. -Pronounced posterior disc osteophyte complex with central posterior disc protrusion at the C2-C3 level (level of cord injury on prior MR) contacts the ventral thecal sac/cord, contributing to acquired stenosis at this level. -Posterior disc osteophyte complex at additional levels of C3-C4, C4-C5, C5-C6 with associated central/ paracentral disc protrusion, as above, also resulting in acquired stenosis. -Mild edema in the retropharyngeal region corresponds to findings on prior MR, favored to represent soft tissue injury in the  absence of fracture. -Carotid vascular disease.  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 MRI reveals acute C2-3 spinal cord contusion 1. On follow up interview, patient answers affirmatively that weakness began acutely after the fall and that it was not present prior to the fall; this is reaffirmed after asking the same questions again after a delay. He was using arms/hands normally at home on Thursday at Thanksgiving dinner per family.  2. Neurosurgery does not feel that he is a candidate for early surgical intervention. Per their note, risks of steroid treatment outweigh potential benefits.  3. Exam findings most consistent with a central cord syndrome 4. Cervical collar 5. PT/OT as tolerated 6. Frequent neuro checks 7. Avoid hypotension.  8. Neurogenic bladder. Continue Foley.   Electronically signed: Dr. Kerney Elbe

## 2017-08-13 NOTE — Progress Notes (Signed)
Patient has had no urine output since admission to [facility] around 1800. Bladder scan shows >700cc urine retained. Pt has no "urge to go." Text page to Dr. Lamona Curl, orders received. Will implement and continue to monitor.

## 2017-08-13 NOTE — Progress Notes (Addendum)
Subjective:  Nurse indicates inability to move arms this am. Has had CT/MRI neurology Seeing cardiac status stable no symptoms and HR/BP ok  Objective:  Vitals:   08/13/17 1015 08/13/17 1030 08/13/17 1045 08/13/17 1100  BP: (!) 116/58 (!) 105/53 (!) 103/55   Pulse: (!) 52 (!) 50 (!) 52 (!) 53  Resp: 15 13 13 15   Temp:      TempSrc:      SpO2: 99% 99% 99% 98%    Intake/Output from previous day:  Intake/Output Summary (Last 24 hours) at 08/13/2017 1155 Last data filed at 08/13/2017 1000 Gross per 24 hour  Intake 2406.1 ml  Output 1000 ml  Net 1406.1 ml    Physical Exam: Affect appropriate Thin black male  HEENT: normal Neck supple with no adenopathy JVP normal no bruits no thyromegaly Lungs clear with no wheezing and good diaphragmatic motion Heart:  S1/S2 no murmur, no rub, gallop or click PMI normal Abdomen: benighn, BS positve, no tenderness, no AAA no bruit.  No HSM or HJR Distal pulses intact with no bruits No edema Neuro non-focal Skin warm and dry Motor deficits in UE;s sensory ok    Lab Results: Basic Metabolic Panel: Recent Labs    08/12/17 1547 08/12/17 1621 08/12/17 1920 08/13/17 0824  NA 137 139  --  137  K 3.3* 3.8  --  3.1*  CL 111 113*  --  113*  CO2 21*  --   --  18*  GLUCOSE 116* 125*  --  112*  BUN 17 15  --  12  CREATININE 1.07 1.00 0.94 0.76  CALCIUM 8.9  --   --  8.1*   Liver Function Tests: Recent Labs    08/12/17 1547  AST 20  ALT 13*  ALKPHOS 73  BILITOT 0.6  PROT 6.7  ALBUMIN 3.8   No results for input(s): LIPASE, AMYLASE in the last 72 hours. CBC: Recent Labs    08/12/17 1547  08/12/17 1920 08/13/17 0824  WBC 4.1  --  7.1 6.4  NEUTROABS 2.3  --   --   --   HGB 10.8*   < > 10.6* 10.7*  HCT 35.0*   < > 33.1* 32.4*  MCV 90.0  --  86.2 85.5  PLT 220  --  224 205   < > = values in this interval not displayed.   Cardiac Enzymes: Recent Labs    08/12/17 1920 08/13/17 0547 08/13/17 0824  TROPONINI <0.03  <0.03 <0.03   BNP: Invalid input(s): POCBNP D-Dimer: No results for input(s): DDIMER in the last 72 hours. Hemoglobin A1C: No results for input(s): HGBA1C in the last 72 hours. Fasting Lipid Panel: No results for input(s): CHOL, HDL, LDLCALC, TRIG, CHOLHDL, LDLDIRECT in the last 72 hours. Thyroid Function Tests: Recent Labs    08/12/17 1547  TSH 4.926*   Anemia Panel: No results for input(s): VITAMINB12, FOLATE, FERRITIN, TIBC, IRON, RETICCTPCT in the last 72 hours.  Imaging: Mr Brain Wo Contrast  Result Date: 08/13/2017 CLINICAL DATA:  Weakness in all 4 extremities. Symptomatic bradycardia with hypotension. Syncope. Recent falls. EXAM: MRI HEAD WITHOUT CONTRAST MRI CERVICAL SPINE WITHOUT CONTRAST TECHNIQUE: Multiplanar, multiecho pulse sequences of the brain and surrounding structures, and cervical spine, to include the craniocervical junction and cervicothoracic junction, were obtained without intravenous contrast. COMPARISON:  Head CT 08/13/2017 FINDINGS: MRI HEAD FINDINGS At the request of the stroke neurologist, only axial and coronal diffusion and axial FLAIR imaging was performed. There is no  restricted diffusion to indicate acute infarct. The ventricles and sulci are normal. Small T2 hyperintense focus in the left lentiform nucleus is without evidence of significant associated gliosis on FLAIR imaging, favoring a dilated perivascular space over chronic lacunar infarct. No significant cerebral white matter disease is seen for age. No intracranial mass effect or extra-axial fluid collection is identified. MRI CERVICAL SPINE FINDINGS The study is mildly motion degraded. Alignment: Trace retrolisthesis of C4 on C5. Vertebrae: There is abnormal marrow edema throughout much of the C4 and C5 vertebral bodies as well as along the C6 superior endplate. There is mild anterior wedging of the C5 greater than C6 vertebral bodies, and there is also mild central depression of the C4 inferior  endplate. There is some T2/STIR signal hyperintensity in the C5-6 disc space which may be traumatic or degenerative, without endplate erosion to specifically suggest infection. Cord: Extensive T2 hyperintensity involving nearly the entire cross-section of the cord at C2-3. Milder T2 hyperintensity is suspected in the cord at C3-4. There is increased trace diffusion signal in the cord at C2-3, however this is favored to reflect T2 shine through rather than acute infarct given the lack of reduced ADC and the configuration/extensiveness of the cord T2 signal abnormality without preferential gray matter involvement. Posterior Fossa, vertebral arteries, paraspinal tissues: Grossly preserved vertebral artery flow voids. Mild prevertebral edema/fluid from C2-C4. No significant edema suggestive of of posterior ligamentous injury. No discrete anterior or posterior longitudinal ligament disruption is identified. 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 C2-3: Small right central disc protrusion and infolding of the ligamentum flavum result in moderate spinal stenosis with mild cord flattening and mild-to-moderate bilateral neural foraminal stenosis. C3-4: Disc bulging, ankle vertebral spurring, and infolding of the ligamentum flavum result in moderate spinal stenosis and moderate right and mild left neural foraminal stenosis. C4-5: Disc bulging, uncovertebral spurring, and mild infolding of the ligamentum flavum result in moderate spinal stenosis and severe right and mild-to-moderate left neural foraminal stenosis. C5-6: Mild disc bulging and uncovertebral spurring result in moderate to severe right and moderate left neural foraminal stenosis and borderline to mild spinal stenosis. C6-7: Small central disc protrusion without spinal stenosis. Mild bilateral neural foraminal narrowing due to uncovertebral spurring. C7-T1:  Negative. IMPRESSION: 1. Spinal cord edema at C2-3, suspicious  for traumatic contusion in the setting of recent falls and congenital and acquired spinal stenosis at this level. This is not felt to be consistent with infarct, although there could be a component of underlying chronic myelopathy secondary to degenerative stenosis. 2. Mild prevertebral edema from C2-C4 also likely related to recent trauma. Vertebral marrow edema from C4-C6 with mild vertebral body wedging at these levels may also be reflective of acute fractures versus degenerative edema. Cervical spine CT is recommended to further assess for fracture. 3. Moderate congenital and acquired spinal stenosis from C2-3 to C4-5. Moderate to severe multilevel neural foraminal stenosis as above. These results were called by telephone at the time of interpretation on 08/13/2017 at 9:23 am to Dr. Cheral Marker, who verbally acknowledged these results. Electronically Signed   By: Logan Bores M.D.   On: 08/13/2017 09:33   Mr Cervical Spine Wo Contrast  Result Date: 08/13/2017 CLINICAL DATA:  Weakness in all 4 extremities. Symptomatic bradycardia with hypotension. Syncope. Recent falls. EXAM: MRI HEAD WITHOUT CONTRAST MRI CERVICAL SPINE WITHOUT CONTRAST TECHNIQUE: Multiplanar, multiecho pulse sequences of the brain and surrounding structures, and cervical spine, to include the craniocervical junction and cervicothoracic  junction, were obtained without intravenous contrast. COMPARISON:  Head CT 08/13/2017 FINDINGS: MRI HEAD FINDINGS At the request of the stroke neurologist, only axial and coronal diffusion and axial FLAIR imaging was performed. There is no restricted diffusion to indicate acute infarct. The ventricles and sulci are normal. Small T2 hyperintense focus in the left lentiform nucleus is without evidence of significant associated gliosis on FLAIR imaging, favoring a dilated perivascular space over chronic lacunar infarct. No significant cerebral white matter disease is seen for age. No intracranial mass effect or  extra-axial fluid collection is identified. MRI CERVICAL SPINE FINDINGS The study is mildly motion degraded. Alignment: Trace retrolisthesis of C4 on C5. Vertebrae: There is abnormal marrow edema throughout much of the C4 and C5 vertebral bodies as well as along the C6 superior endplate. There is mild anterior wedging of the C5 greater than C6 vertebral bodies, and there is also mild central depression of the C4 inferior endplate. There is some T2/STIR signal hyperintensity in the C5-6 disc space which may be traumatic or degenerative, without endplate erosion to specifically suggest infection. Cord: Extensive T2 hyperintensity involving nearly the entire cross-section of the cord at C2-3. Milder T2 hyperintensity is suspected in the cord at C3-4. There is increased trace diffusion signal in the cord at C2-3, however this is favored to reflect T2 shine through rather than acute infarct given the lack of reduced ADC and the configuration/extensiveness of the cord T2 signal abnormality without preferential gray matter involvement. Posterior Fossa, vertebral arteries, paraspinal tissues: Grossly preserved vertebral artery flow voids. Mild prevertebral edema/fluid from C2-C4. No significant edema suggestive of of posterior ligamentous injury. No discrete anterior or posterior longitudinal ligament disruption is identified. 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 C2-3: Small right central disc protrusion and infolding of the ligamentum flavum result in moderate spinal stenosis with mild cord flattening and mild-to-moderate bilateral neural foraminal stenosis. C3-4: Disc bulging, ankle vertebral spurring, and infolding of the ligamentum flavum result in moderate spinal stenosis and moderate right and mild left neural foraminal stenosis. C4-5: Disc bulging, uncovertebral spurring, and mild infolding of the ligamentum flavum result in moderate spinal stenosis and severe right and  mild-to-moderate left neural foraminal stenosis. C5-6: Mild disc bulging and uncovertebral spurring result in moderate to severe right and moderate left neural foraminal stenosis and borderline to mild spinal stenosis. C6-7: Small central disc protrusion without spinal stenosis. Mild bilateral neural foraminal narrowing due to uncovertebral spurring. C7-T1:  Negative. IMPRESSION: 1. Spinal cord edema at C2-3, suspicious for traumatic contusion in the setting of recent falls and congenital and acquired spinal stenosis at this level. This is not felt to be consistent with infarct, although there could be a component of underlying chronic myelopathy secondary to degenerative stenosis. 2. Mild prevertebral edema from C2-C4 also likely related to recent trauma. Vertebral marrow edema from C4-C6 with mild vertebral body wedging at these levels may also be reflective of acute fractures versus degenerative edema. Cervical spine CT is recommended to further assess for fracture. 3. Moderate congenital and acquired spinal stenosis from C2-3 to C4-5. Moderate to severe multilevel neural foraminal stenosis as above. These results were called by telephone at the time of interpretation on 08/13/2017 at 9:23 am to Dr. Cheral Marker, who verbally acknowledged these results. Electronically Signed   By: Logan Bores M.D.   On: 08/13/2017 09:33   Dg Chest Portable 1 View  Result Date: 08/12/2017 CLINICAL DATA:  Hypotension. EXAM: PORTABLE CHEST 1 VIEW COMPARISON:  None. FINDINGS: Mild cardiomegaly is noted. No pneumothorax or pleural effusion is noted. Both lungs are clear. The visualized skeletal structures are unremarkable. IMPRESSION: No acute cardiopulmonary abnormality seen. Electronically Signed   By: Marijo Conception, M.D.   On: 08/12/2017 16:44   Dg Shoulder Left Port  Result Date: 08/12/2017 CLINICAL DATA:  71 year old male with multiple recent falls. Left shoulder and hip pain and limited range of motion. EXAM: LEFT  SHOULDER - 1 VIEW COMPARISON:  None. FINDINGS: There is no evidence of fracture or dislocation. There is no evidence of arthropathy or other focal bone abnormality. Soft tissues are unremarkable. IMPRESSION: Negative. Electronically Signed   By: Kristopher Oppenheim M.D.   On: 08/12/2017 20:37   Dg Hip Port Unilat With Pelvis 1v Left  Result Date: 08/12/2017 CLINICAL DATA:  71 year old male with multiple falls recently. Pain to left shoulder and hip with limited range of motion. EXAM: DG HIP (WITH OR WITHOUT PELVIS) 1V PORT LEFT COMPARISON:  None. FINDINGS: There is no evidence of hip fracture or dislocation. There is no evidence of arthropathy or other focal bone abnormality. IMPRESSION: No definite radiographic evidence of acute fracture or dislocation. Electronically Signed   By: Kristopher Oppenheim M.D.   On: 08/12/2017 20:36   Ct Head Code Stroke Wo Contrast`  Result Date: 08/13/2017 CLINICAL DATA:  Code stroke. Bilateral upper and lower extremity weakness. EXAM: CT HEAD WITHOUT CONTRAST TECHNIQUE: Contiguous axial images were obtained from the base of the skull through the vertex without intravenous contrast. COMPARISON:  None. FINDINGS: Brain: There is an 8 mm focus of mild hypoattenuation in the left ventrolateral pons (series 3, images 8 and 9). There is no evidence of acute cortically based supratentorial infarct, intracranial hemorrhage, mass, midline shift, or extra-axial fluid collection. The ventricles and sulci are normal. Vascular: Calcified atherosclerosis at the skullbase. No hyperdense vessel. Skull: No fracture or focal osseous lesion. Sinuses/Orbits: Visualized paranasal sinuses are clear. Minimal chronic appearing left mastoid air cell opacification. Unremarkable orbits. Other: None. ASPECTS [facility] Stroke Program Early CT Score) Not scored due to non-localizing symptoms. IMPRESSION: 1. 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 2. No evidence of acute supratentorial infarct or  hemorrhage. Results sent via text page to Dr. Lorraine Lax on 08/13/2017 at 6:52 a.m. Electronically Signed   By: Logan Bores M.D.   On: 08/13/2017 06:53    Cardiac Studies:  ECG: SR rate 73 ICLBBB    Telemetry: sinus bradycardia rates low 50's no heart block or long pauses   Echo:   Medications:   . aspirin EC  81 mg Oral Daily  . dexamethasone  10 mg Intravenous Once  . dexamethasone  4 mg Oral Q6H  . heparin  5,000 Units Subcutaneous Q8H     . norepinephrine (LEVOPHED) Adult infusion 2 mcg/min (08/13/17 1000)    Assessment/Plan:  Bradycardia:  Asymptomatic no heart block no indication for pacer Baseline ECG ok  Neuro:  Has been seen by neurosurgery decadron given not clear if this is a surgical Issue but ok to proceed if needed from cardiac perspective  Hypotension : improved on low dose levophed echo pending hydrate and replete K  Jenkins Rouge 08/13/2017, 11:55 AM

## 2017-08-13 NOTE — Progress Notes (Signed)
At 0550 RN went to give tylenol and noted patient was unable to move either arm. Upon further assessment, pt not responding to painful stimulus on BUEs. BLE weakness is worse as well, but pt is able to raise both legs with some resistance against gravity. Pt remains A&Ox4, see detailed neuro assessment in flowsheets. Code Stroke called, pt transported to CT and then to MRI, see neurologists note. Patients guardian, [name] called and updated on pt condition and new plan of care.

## 2017-08-13 NOTE — Progress Notes (Signed)
Foley catheter inserted for spinal cord injury per MD verbal order, peri care provided prior to insertion and after along with foley care.  Rowe Pavy, RN

## 2017-08-13 NOTE — Progress Notes (Signed)
  Echocardiogram 2D Echocardiogram has been performed.  Dean Oliver M 08/13/2017, 12:50 PM

## 2017-08-13 NOTE — Plan of Care (Signed)
  Not Progressing Elimination: Will not experience complications related to urinary retention 08/13/2017 0214 - Not Progressing by Alonna Buckler, RN Note Patient required in and out straight cath this evening for retention.

## 2017-08-13 NOTE — Consult Note (Signed)
Reason for Consult: Cord injury Referring Physician: [name] is an 71 y.o. male.   HPI:  71 year old gentleman with a history of schizophrenia who was admitted with multiple recent falls and attention and bradycardia, started on levo fed. Details of the history are difficult to obtain, and patient cannot give me full details. Patient was documented in the history and physical to have normal and equal strength in the upper and lower extremities. Sometime this morning the nurses noted him to have significant weakness of the extremities. Code stroke was initiated an MRI of the brain and cervical spine was ordered. MRI of the cervical spine shows signal change in the spinal cord at C2-3. Patient complains of weakness in the arms but no numbness or tingling.  Past Medical History:  Diagnosis Date  . Cataracts, bilateral   . HTN (hypertension)   . Schizophrenia [facility])     Past Surgical History:  Procedure Laterality Date  . None      No Known Allergies  Social History   Tobacco Use  . Smoking status: Unknown If Ever Smoked  Substance Use Topics  . Alcohol use: Not on file    History reviewed. No pertinent family history.   Review of Systems  Positive ROS: Unable to get full detailed review of systems  All other systems have been reviewed and were otherwise negative with the exception of those mentioned in the HPI and as above.  Objective: Vital signs in last 24 hours: Temp:  [97.9 F (36.6 C)-100.9 F (38.3 C)] 98.9 F (37.2 C) (11/24 0900) Pulse Rate:  [46-78] 50 (11/24 1030) Resp:  [9-29] 13 (11/24 1030) BP: (65-152)/(40-80) 105/53 (11/24 1030) SpO2:  [86 %-100 %] 99 % (11/24 1030)  General Appearance: Alert, cooperative, no distress, appears stated age Head: Normocephalic, without obvious abnormality, atraumatic, edentulous Eyes: PERRL, conjunctiva/corneas clear, EOM's intact   Neck: Supple Lungs:  respirations unlabored Extremities: Extremities normal,  atraumatic, no cyanosis or edema  NEUROLOGIC:   Mental status: A&O x4, no aphasia, good attention span, Memory and fund of knowledge are difficult to assess Motor Exam - he can shrug his shoulders but has no movement in the upper extremities otherwise, he can move his feet and can partially draw his legs up with his hip flexors Sensory Exam - grossly normal as best I can tell Reflexes: Areflexic, no Hoffmann Coordination - unable to test Gait - unable to test Balance - unable to test Cranial Nerves: I: smell Not tested  II: visual acuity  OS: na    OD: na  II: visual fields Full to confrontation  II: pupils Equal, round, reactive to light  III,VII: ptosis None  III,IV,VI: extraocular muscles  Full ROM  V: mastication Normal  V: facial light touch sensation  Normal  V,VII: corneal reflex  Present  VII: facial muscle function - upper  Normal  VII: facial muscle function - lower Normal  VIII: hearing Not tested  IX: soft palate elevation  Normal  IX,X: gag reflex Present  XI: trapezius strength  5/5  XI: sternocleidomastoid strength 5/5  XI: neck flexion strength  5/5  XII: tongue strength  Normal    Data Review Lab Results  Component Value Date   WBC 6.4 08/13/2017   HGB 10.7 (L) 08/13/2017   HCT 32.4 (L) 08/13/2017   MCV 85.5 08/13/2017   PLT 205 08/13/2017   Lab Results  Component Value Date   NA 137 08/13/2017   K 3.1 (L) 08/13/2017  CL 113 (H) 08/13/2017   CO2 18 (L) 08/13/2017   BUN 12 08/13/2017   CREATININE 0.76 08/13/2017   GLUCOSE 112 (H) 08/13/2017   Lab Results  Component Value Date   INR 1.27 08/13/2017    Radiology: Mr Brain Wo Contrast  Result Date: 08/13/2017 CLINICAL DATA:  Weakness in all 4 extremities. Symptomatic bradycardia with hypotension. Syncope. Recent falls. EXAM: MRI HEAD WITHOUT CONTRAST MRI CERVICAL SPINE WITHOUT CONTRAST TECHNIQUE: Multiplanar, multiecho pulse sequences of the brain and surrounding structures, and cervical spine,  to include the craniocervical junction and cervicothoracic junction, were obtained without intravenous contrast. COMPARISON:  Head CT 08/13/2017 FINDINGS: MRI HEAD FINDINGS At the request of the stroke neurologist, only axial and coronal diffusion and axial FLAIR imaging was performed. There is no restricted diffusion to indicate acute infarct. The ventricles and sulci are normal. Small T2 hyperintense focus in the left lentiform nucleus is without evidence of significant associated gliosis on FLAIR imaging, favoring a dilated perivascular space over chronic lacunar infarct. No significant cerebral white matter disease is seen for age. No intracranial mass effect or extra-axial fluid collection is identified. MRI CERVICAL SPINE FINDINGS The study is mildly motion degraded. Alignment: Trace retrolisthesis of C4 on C5. Vertebrae: There is abnormal marrow edema throughout much of the C4 and C5 vertebral bodies as well as along the C6 superior endplate. There is mild anterior wedging of the C5 greater than C6 vertebral bodies, and there is also mild central depression of the C4 inferior endplate. There is some T2/STIR signal hyperintensity in the C5-6 disc space which may be traumatic or degenerative, without endplate erosion to specifically suggest infection. Cord: Extensive T2 hyperintensity involving nearly the entire cross-section of the cord at C2-3. Milder T2 hyperintensity is suspected in the cord at C3-4. There is increased trace diffusion signal in the cord at C2-3, however this is favored to reflect T2 shine through rather than acute infarct given the lack of reduced ADC and the configuration/extensiveness of the cord T2 signal abnormality without preferential gray matter involvement. Posterior Fossa, vertebral arteries, paraspinal tissues: Grossly preserved vertebral artery flow voids. Mild prevertebral edema/fluid from C2-C4. No significant edema suggestive of of posterior ligamentous injury. No discrete  anterior or posterior longitudinal ligament disruption is identified. 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 C2-3: Small right central disc protrusion and infolding of the ligamentum flavum result in moderate spinal stenosis with mild cord flattening and mild-to-moderate bilateral neural foraminal stenosis. C3-4: Disc bulging, ankle vertebral spurring, and infolding of the ligamentum flavum result in moderate spinal stenosis and moderate right and mild left neural foraminal stenosis. C4-5: Disc bulging, uncovertebral spurring, and mild infolding of the ligamentum flavum result in moderate spinal stenosis and severe right and mild-to-moderate left neural foraminal stenosis. C5-6: Mild disc bulging and uncovertebral spurring result in moderate to severe right and moderate left neural foraminal stenosis and borderline to mild spinal stenosis. C6-7: Small central disc protrusion without spinal stenosis. Mild bilateral neural foraminal narrowing due to uncovertebral spurring. C7-T1:  Negative. IMPRESSION: 1. Spinal cord edema at C2-3, suspicious for traumatic contusion in the setting of recent falls and congenital and acquired spinal stenosis at this level. This is not felt to be consistent with infarct, although there could be a component of underlying chronic myelopathy secondary to degenerative stenosis. 2. Mild prevertebral edema from C2-C4 also likely related to recent trauma. Vertebral marrow edema from C4-C6 with mild vertebral body wedging at these levels may also  be reflective of acute fractures versus degenerative edema. Cervical spine CT is recommended to further assess for fracture. 3. Moderate congenital and acquired spinal stenosis from C2-3 to C4-5. Moderate to severe multilevel neural foraminal stenosis as above. These results were called by telephone at the time of interpretation on 08/13/2017 at 9:23 am to Dr. Cheral Marker, who verbally acknowledged these results.  Electronically Signed   By: Logan Bores M.D.   On: 08/13/2017 09:33   Mr Cervical Spine Wo Contrast  Result Date: 08/13/2017 CLINICAL DATA:  Weakness in all 4 extremities. Symptomatic bradycardia with hypotension. Syncope. Recent falls. EXAM: MRI HEAD WITHOUT CONTRAST MRI CERVICAL SPINE WITHOUT CONTRAST TECHNIQUE: Multiplanar, multiecho pulse sequences of the brain and surrounding structures, and cervical spine, to include the craniocervical junction and cervicothoracic junction, were obtained without intravenous contrast. COMPARISON:  Head CT 08/13/2017 FINDINGS: MRI HEAD FINDINGS At the request of the stroke neurologist, only axial and coronal diffusion and axial FLAIR imaging was performed. There is no restricted diffusion to indicate acute infarct. The ventricles and sulci are normal. Small T2 hyperintense focus in the left lentiform nucleus is without evidence of significant associated gliosis on FLAIR imaging, favoring a dilated perivascular space over chronic lacunar infarct. No significant cerebral white matter disease is seen for age. No intracranial mass effect or extra-axial fluid collection is identified. MRI CERVICAL SPINE FINDINGS The study is mildly motion degraded. Alignment: Trace retrolisthesis of C4 on C5. Vertebrae: There is abnormal marrow edema throughout much of the C4 and C5 vertebral bodies as well as along the C6 superior endplate. There is mild anterior wedging of the C5 greater than C6 vertebral bodies, and there is also mild central depression of the C4 inferior endplate. There is some T2/STIR signal hyperintensity in the C5-6 disc space which may be traumatic or degenerative, without endplate erosion to specifically suggest infection. Cord: Extensive T2 hyperintensity involving nearly the entire cross-section of the cord at C2-3. Milder T2 hyperintensity is suspected in the cord at C3-4. There is increased trace diffusion signal in the cord at C2-3, however this is favored to  reflect T2 shine through rather than acute infarct given the lack of reduced ADC and the configuration/extensiveness of the cord T2 signal abnormality without preferential gray matter involvement. Posterior Fossa, vertebral arteries, paraspinal tissues: Grossly preserved vertebral artery flow voids. Mild prevertebral edema/fluid from C2-C4. No significant edema suggestive of of posterior ligamentous injury. No discrete anterior or posterior longitudinal ligament disruption is identified. 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 C2-3: Small right central disc protrusion and infolding of the ligamentum flavum result in moderate spinal stenosis with mild cord flattening and mild-to-moderate bilateral neural foraminal stenosis. C3-4: Disc bulging, ankle vertebral spurring, and infolding of the ligamentum flavum result in moderate spinal stenosis and moderate right and mild left neural foraminal stenosis. C4-5: Disc bulging, uncovertebral spurring, and mild infolding of the ligamentum flavum result in moderate spinal stenosis and severe right and mild-to-moderate left neural foraminal stenosis. C5-6: Mild disc bulging and uncovertebral spurring result in moderate to severe right and moderate left neural foraminal stenosis and borderline to mild spinal stenosis. C6-7: Small central disc protrusion without spinal stenosis. Mild bilateral neural foraminal narrowing due to uncovertebral spurring. C7-T1:  Negative. IMPRESSION: 1. Spinal cord edema at C2-3, suspicious for traumatic contusion in the setting of recent falls and congenital and acquired spinal stenosis at this level. This is not felt to be consistent with infarct, although there could be a  component of underlying chronic myelopathy secondary to degenerative stenosis. 2. Mild prevertebral edema from C2-C4 also likely related to recent trauma. Vertebral marrow edema from C4-C6 with mild vertebral body wedging at these levels  may also be reflective of acute fractures versus degenerative edema. Cervical spine CT is recommended to further assess for fracture. 3. Moderate congenital and acquired spinal stenosis from C2-3 to C4-5. Moderate to severe multilevel neural foraminal stenosis as above. These results were called by telephone at the time of interpretation on 08/13/2017 at 9:23 am to Dr. Cheral Marker, who verbally acknowledged these results. Electronically Signed   By: Logan Bores M.D.   On: 08/13/2017 09:33   Dg Chest Portable 1 View  Result Date: 08/12/2017 CLINICAL DATA:  Hypotension. EXAM: PORTABLE CHEST 1 VIEW COMPARISON:  None. FINDINGS: Mild cardiomegaly is noted. No pneumothorax or pleural effusion is noted. Both lungs are clear. The visualized skeletal structures are unremarkable. IMPRESSION: No acute cardiopulmonary abnormality seen. Electronically Signed   By: Marijo Conception, M.D.   On: 08/12/2017 16:44   Dg Shoulder Left Port  Result Date: 08/12/2017 CLINICAL DATA:  71 year old male with multiple recent falls. Left shoulder and hip pain and limited range of motion. EXAM: LEFT SHOULDER - 1 VIEW COMPARISON:  None. FINDINGS: There is no evidence of fracture or dislocation. There is no evidence of arthropathy or other focal bone abnormality. Soft tissues are unremarkable. IMPRESSION: Negative. Electronically Signed   By: Kristopher Oppenheim M.D.   On: 08/12/2017 20:37   Dg Hip Port Unilat With Pelvis 1v Left  Result Date: 08/12/2017 CLINICAL DATA:  71 year old male with multiple falls recently. Pain to left shoulder and hip with limited range of motion. EXAM: DG HIP (WITH OR WITHOUT PELVIS) 1V PORT LEFT COMPARISON:  None. FINDINGS: There is no evidence of hip fracture or dislocation. There is no evidence of arthropathy or other focal bone abnormality. IMPRESSION: No definite radiographic evidence of acute fracture or dislocation. Electronically Signed   By: Kristopher Oppenheim M.D.   On: 08/12/2017 20:36   Ct Head Code  Stroke Wo Contrast`  Result Date: 08/13/2017 CLINICAL DATA:  Code stroke. Bilateral upper and lower extremity weakness. EXAM: CT HEAD WITHOUT CONTRAST TECHNIQUE: Contiguous axial images were obtained from the base of the skull through the vertex without intravenous contrast. COMPARISON:  None. FINDINGS: Brain: There is an 8 mm focus of mild hypoattenuation in the left ventrolateral pons (series 3, images 8 and 9). There is no evidence of acute cortically based supratentorial infarct, intracranial hemorrhage, mass, midline shift, or extra-axial fluid collection. The ventricles and sulci are normal. Vascular: Calcified atherosclerosis at the skullbase. No hyperdense vessel. Skull: No fracture or focal osseous lesion. Sinuses/Orbits: Visualized paranasal sinuses are clear. Minimal chronic appearing left mastoid air cell opacification. Unremarkable orbits. Other: None. ASPECTS [facility] Stroke Program Early CT Score) Not scored due to non-localizing symptoms. IMPRESSION: 1. 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 2. No evidence of acute supratentorial infarct or hemorrhage. Results sent via text page to Dr. Lorraine Lax on 08/13/2017 at 6:52 a.m. Electronically Signed   By: Logan Bores M.D.   On: 08/13/2017 06:53     Assessment/Plan: 71 year old gentleman with acute weakness and signal change in his cord at C2-3. Given the imaging findings, I would suspect he has a central cord syndrome from moderate spinal stenosis and a fall, however I would expect him to have acute weakness at the time of the fall and not develop this in a delayed fashion.  Given his hypotension and use of Levophed I  guess it is possible he could have a spinal cord infarct leading to acute weakness. 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  He has no ongoing cord compression and only moderate spinal stenosis. I would not recommend early surgical  intervention. He may or may not need surgical intervention in the future. If he had ongoing severe stenosis then I would recommend a delayed cervical decompression in the future, but this is not the case here. Obviously, if this represents a spinal cord infarct then no surgery is indicated.  I would not recommend Solu-Medrol protocol as most experts agree now that the risks outweigh the potential benefits, especially given the fact that we do not know that this was a traumatic situation and this could represent cord infarct.  I would recommend a cervical collar to be worn when out of bed. We will do this just in case this was a traumatic injury. CT scan is ordered.  Physical and occupational therapy will be needed.   Dean Oliver S 08/13/2017 10:40 AM

## 2017-08-13 NOTE — Consult Note (Addendum)
Requesting Physician: Dr.     Laurel Dimmer Complaint:   History obtained from:   Patient and Chart     HPI:                                                                                                                                       Dean Oliver is a 71 y.o. male African-American with past medical history of cataracts, hypertension, schizophrenia admitted for symptomatic bradycardia resulting in hypotension. Is admitted in the ICU and on pressors.  According to his nurse the patient seemed weak in all 4 extremities when she first assessed him at 7 PM last night. 6 AM he seems significantly worse and was no longer able to lift his arms against gravity. Stroke alert was called.  On reviewing his records, patient was last seen by his family at 62 PM although one family member said he was not himself. Yesterday afternoon he had a syncopal event at his group home and when EMS arrived the patients heart rate was in the 30s and blood pressure in the 50 systolic. Neurological assessment in the ER documented ( at 15.59) that he was lethargic, there was no mention of his upper and lower extremity strength in the notes.  8 pm nurse notes that he is weak bilateral arms, also he was found to urinary retention   Date last known well: 11.23.18 Time last known well: Around 2-3 pm  tPA Given: outside window Baseline MRS 2   Past Medical History:  Diagnosis Date  . Cataracts, bilateral   . HTN (hypertension)   . Schizophrenia [facility])     Past Surgical History:  Procedure Laterality Date  . None      No family history on file. Social History:  has no tobacco, alcohol, and drug history on file.  Allergies: No Known Allergies  Medications:                                                                                                                        I reviewed home medications   ROS:  14 systems reviewed and negative except above    Examination:                                                                                                      General: Appears well-developed and well-nourished.  Psych: Affect appropriate to situation Eyes: No scleral injection HENT: No OP obstrucion Head: Normocephalic.  Cardiovascular: Normal rate and regular rhythm.  Respiratory: Effort normal and breath sounds normal to anterior ascultation GI: Soft.  No distension. There is no tenderness.  Skin: WDI   Neurological Examination Mental Status: Alert, oriented, thought content appropriate.  Speech fluent without evidence of aphasia.  Able to follow 3 step commands without difficulty. Cranial Nerves: II : Pupils R 4-5 mm, irregular, L 2-3 mm, VF: Impaired in all 4 quadrants  VAunable to finger count in both eyes, VF impaired  III,IV, VI: ptosis not present, extra-ocular motions intact bilaterally, pupils equal, round, reactive to light and accommodation V,VII: smile symmetric, facial light touch sensation normal bilaterally VIII: hearing normal bilaterally IX,X: uvula rises symmetrically XI: bilateral shoulder shrug XII: midline tongue extension Motor: Right : Upper extremity   2/5    Left:     Upper extremity   2/5  Lower extremity   3/5     Lower extremity   3/5 Tone and bulk:normal tone throughout; no atrophy noted Sensory: Reduced sensation to pain, sensation in both arms and legs, face spared.  Proprioception, vibration appears to be intact. Deep Tendon Reflexes: 2+ and symmetric throughout Plantars: Right: downgoing   Left: downgoing Cerebellar: normal finger-to-nose, normal rapid alternating movements and normal heel-to-shin test Gait: normal gait and station     Lab Results: Basic Metabolic Panel: Recent Labs  Lab 08/12/17 1547 08/12/17 1621 08/12/17 1920  NA 137 139  --   K 3.3* 3.8  --   CL 111 113*  --   CO2 21*  --   --    GLUCOSE 116* 125*  --   BUN 17 15  --   CREATININE 1.07 1.00 0.94  CALCIUM 8.9  --   --     CBC: Recent Labs  Lab 08/12/17 1547 08/12/17 1621 08/12/17 1920  WBC 4.1  --  7.1  NEUTROABS 2.3  --   --   HGB 10.8* 9.9* 10.6*  HCT 35.0* 29.0* 33.1*  MCV 90.0  --  86.2  PLT 220  --  224    Coagulation Studies: No results for input(s): LABPROT, INR in the last 72 hours.  Imaging: Dg Chest Portable 1 View  Result Date: 08/12/2017 CLINICAL DATA:  Hypotension. EXAM: PORTABLE CHEST 1 VIEW COMPARISON:  None. FINDINGS: Mild cardiomegaly is noted. No pneumothorax or pleural effusion is noted. Both lungs are clear. The visualized skeletal structures are unremarkable. IMPRESSION: No acute cardiopulmonary abnormality seen. Electronically Signed   By: Marijo Conception, M.D.   On: 08/12/2017 16:44   Dg Shoulder Left Port  Result Date: 08/12/2017 CLINICAL DATA:  71 year old male with multiple recent falls. Left shoulder and hip pain and limited range of motion. EXAM: LEFT  SHOULDER - 1 VIEW COMPARISON:  None. FINDINGS: There is no evidence of fracture or dislocation. There is no evidence of arthropathy or other focal bone abnormality. Soft tissues are unremarkable. IMPRESSION: Negative. Electronically Signed   By: Kristopher Oppenheim M.D.   On: 08/12/2017 20:37   Dg Hip Port Unilat With Pelvis 1v Left  Result Date: 08/12/2017 CLINICAL DATA:  71 year old male with multiple falls recently. Pain to left shoulder and hip with limited range of motion. EXAM: DG HIP (WITH OR WITHOUT PELVIS) 1V PORT LEFT COMPARISON:  None. FINDINGS: There is no evidence of hip fracture or dislocation. There is no evidence of arthropathy or other focal bone abnormality. IMPRESSION: No definite radiographic evidence of acute fracture or dislocation. Electronically Signed   By: Kristopher Oppenheim M.D.   On: 08/12/2017 20:36   Ct Head Code Stroke Wo Contrast`  Result Date: 08/13/2017 CLINICAL DATA:  Code stroke. Bilateral upper and  lower extremity weakness. EXAM: CT HEAD WITHOUT CONTRAST TECHNIQUE: Contiguous axial images were obtained from the base of the skull through the vertex without intravenous contrast. COMPARISON:  None. FINDINGS: Brain: There is an 8 mm focus of mild hypoattenuation in the left ventrolateral pons (series 3, images 8 and 9). There is no evidence of acute cortically based supratentorial infarct, intracranial hemorrhage, mass, midline shift, or extra-axial fluid collection. The ventricles and sulci are normal. Vascular: Calcified atherosclerosis at the skullbase. No hyperdense vessel. Skull: No fracture or focal osseous lesion. Sinuses/Orbits: Visualized paranasal sinuses are clear. Minimal chronic appearing left mastoid air cell opacification. Unremarkable orbits. Other: None. ASPECTS [facility] Stroke Program Early CT Score) Not scored due to non-localizing symptoms. IMPRESSION: 1. 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 2. No evidence of acute supratentorial infarct or hemorrhage. Results sent via text page to Dr. Lorraine Lax on 08/13/2017 at 6:52 a.m. Electronically Signed   By: Logan Bores M.D.   On: 08/13/2017 06:53     ASSESSMENT AND PLAN   Quadriparesis  Acute to subacute in onset in the setting of hypotension is concerning for medullary vs spinal cord infarct. Also the patient has had 2 falls, and therefore traumatic injury to the spinal cord needs to be ruled out.  Other differentials include myelitis though less likely given patient is afebrile.  Plan CT head - negative for hemorrhage  MRI Brain, C spine with DWI  IV fluid Goal Bp > 140/90    Chimamanda Siegfried [facility] Pager Number [phone]

## 2017-08-13 NOTE — Progress Notes (Signed)
Neurosurgery has seen the patient. They suspect that he has a central cord syndrome from moderate spinal stenosis and a fall, however the patient would have been expected to have acute weakness at the time of the fall and not develop this in a delayed fashion. Given his hypotension and use of Levophed it is also felt possible that he could have a spinal cord infarct leading to acute weakness. 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  Neurosurgery does not feel that he is a candidate for early surgical intervention. Per their note, opinion of Neurosurgery is that risks of steroid protocol would outweigh potential benefits. Dexamethasone has therefore been discontinued. Will monitor to see if the single 10 mg dose of Dexamethasone that the patient received results in improvement. If so, will call Neurosurgery to discuss further. Neurosurgery has also recommended a cervical collar when the patient is OOB.   A/R: 1. Frequent neuro checks 2. Avoid hypotension.  3. CT of neck has been ordered.   Electronically signed: Dr. Kerney Elbe

## 2017-08-13 NOTE — Code Documentation (Signed)
Responded to Code Stroke called at [place].  Pt with progessive weakness t/o night, now with inabilty to move B UE.  [id number], [id number], CBG-115.  Dr Aroor to bedside, cancelled code stroke but wanted CT head.  Pt transported to CT and then to MRI for c-spine scan.

## 2017-08-13 NOTE — Progress Notes (Signed)
Family at bedside, updated by Dr. Cheral Marker.  Rowe Pavy, RN

## 2017-08-14 DIAGNOSIS — S14129A Central cord syndrome at unspecified level of cervical spinal cord, initial encounter: Secondary | ICD-10-CM | POA: Diagnosis not present

## 2017-08-14 DIAGNOSIS — R001 Bradycardia, unspecified: Secondary | ICD-10-CM | POA: Diagnosis not present

## 2017-08-14 DIAGNOSIS — S14109A Unspecified injury at unspecified level of cervical spinal cord, initial encounter: Secondary | ICD-10-CM | POA: Diagnosis not present

## 2017-08-14 DIAGNOSIS — G8250 Quadriplegia, unspecified: Secondary | ICD-10-CM | POA: Diagnosis not present

## 2017-08-14 DIAGNOSIS — D62 Acute posthemorrhagic anemia: Secondary | ICD-10-CM | POA: Diagnosis not present

## 2017-08-14 DIAGNOSIS — S14122A Central cord syndrome at C2 level of cervical spinal cord, initial encounter: Secondary | ICD-10-CM | POA: Diagnosis not present

## 2017-08-14 DIAGNOSIS — M5000 Cervical disc disorder with myelopathy, unspecified cervical region: Secondary | ICD-10-CM | POA: Diagnosis not present

## 2017-08-14 DIAGNOSIS — E871 Hypo-osmolality and hyponatremia: Secondary | ICD-10-CM | POA: Diagnosis not present

## 2017-08-14 LAB — BASIC METABOLIC PANEL
Anion gap: 4 — ABNORMAL LOW (ref 5–15)
BUN: 12 mg/dL (ref 6–20)
CHLORIDE: 112 mmol/L — AB (ref 101–111)
CO2: 20 mmol/L — ABNORMAL LOW (ref 22–32)
Calcium: 8.3 mg/dL — ABNORMAL LOW (ref 8.9–10.3)
Creatinine, Ser: 0.71 mg/dL (ref 0.61–1.24)
GFR calc non Af Amer: >60 mL/min (ref >60)
Glucose, Bld: 103 mg/dL — ABNORMAL HIGH (ref 65–99)
POTASSIUM: 3.8 mmol/L (ref 3.5–5.1)
SODIUM: 136 mmol/L (ref 135–145)

## 2017-08-14 MED ORDER — MUSCLE RUB 10-15 % EX CREA
TOPICAL_CREAM | CUTANEOUS | Status: DC | PRN
  Administered 2017-08-14 – 2017-08-16 (×3): via TOPICAL
  Filled 2017-08-14: qty 85

## 2017-08-14 NOTE — Progress Notes (Signed)
At this time no further neurological recommendations. AS per Neurosurgery "do not recommend early surgical intervention as he does not have ongoing cord compression and therefore early surgery likely will not change the neurologic outcome, Neurosurgery will see him in 2 weeks with repeat MRI. " No further input from neurology.  S/O  Etta Quill PA-C [facility] [phone]  M-F  (8:30 am- 4 PM)  08/14/2017, 8:49 AM   ,

## 2017-08-14 NOTE — Progress Notes (Signed)
Patient ID: Dean Oliver, male   DOB: 01/26/1946, 71 y.o.   MRN: [id number] Subjective: Patient reports some neck soreness and arm soreness  Objective: Vital signs in last 24 hours: Temp:  [98.3 F (36.8 C)-98.9 F (37.2 C)] 98.8 F (37.1 C) (11/25 0319) Pulse Rate:  [39-67] 44 (11/25 0700) Resp:  [9-19] 13 (11/25 0700) BP: (91-152)/(52-69) 141/65 (11/25 0700) SpO2:  [96 %-100 %] 100 % (11/25 0700)  Intake/Output from previous day: 11/24 0701 - 11/25 0700 In: 2348.1 [P.O.:480; I.V.:1868.1] Out: 945 [Urine:945] Intake/Output this shift: No intake/output data recorded.  He is awake and alert, he is edentulous and his speech is difficult to discern, he has no movement of the upper extremities except for shoulder shrug, he does have some movement in the lower extremities with some hip flexion and ankle movement, he reports gross sensation in the upper and lower extremities  Lab Results: Lab Results  Component Value Date   WBC 6.4 08/13/2017   HGB 10.7 (L) 08/13/2017   HCT 32.4 (L) 08/13/2017   MCV 85.5 08/13/2017   PLT 205 08/13/2017   Lab Results  Component Value Date   INR 1.27 08/13/2017   BMET Lab Results  Component Value Date   NA 136 08/14/2017   K 3.8 08/14/2017   CL 112 (H) 08/14/2017   CO2 20 (L) 08/14/2017   GLUCOSE 103 (H) 08/14/2017   BUN 12 08/14/2017   CREATININE 0.71 08/14/2017   CALCIUM 8.3 (L) 08/14/2017    Studies/Results: Ct Cervical Spine Wo Contrast  Result Date: 08/13/2017 CLINICAL DATA:  71 year old male with a history of fall and cervical cord edema on MRI EXAM: CT CERVICAL SPINE WITHOUT CONTRAST TECHNIQUE: Multidetector CT imaging of the cervical spine was performed without intravenous contrast. Multiplanar CT image reconstructions were also generated. COMPARISON:  None. FINDINGS: Alignment: Cervical vertebral bodies maintain alignment with no subluxation, retrolisthesis, anterolisthesis. Skull base and vertebrae: Craniocervical junction  aligned. No skullbase fracture identified. Similar configuration of vertebral bodies compared to the MR with mild body height loss of C4 and C5. No fracture line identified. Soft tissues and spinal canal: No hyperdense material within the spinal canal. Calcifications of the bilateral carotid system. No focal fluid collection. Questionable trace low-density edema in the retropharyngeal soft tissues, corresponds to findings on prior MRI. Disc levels: C2-C3: Posterior disc osteophyte complex with central posterior disc protrusion appearing to contact the ventral aspect of the cord. Uncovertebral joint disease contributes to minimal bilateral neural foraminal narrowing. C3-C4: Posterior disc osteophyte complex with right eccentric disc bulge/protrusion appears to contact the ventral thecal sac. Bilateral uncovertebral joint disease contributes to minimal neural foraminal narrowing. C4-C5: Posterior disc osteophyte complex with central disc bulge/protrusion appears to contact the ventral thecal sac/cord. Uncovertebral joint disease contributes to minimal bilateral neural foraminal narrowing. C5-C6: Posterior disc osteophyte complex with left eccentric disc bulge/protrusion appears to contact the left aspect of the thecal sac. No significant neural foraminal narrowing. C6-C7: Mild degenerative disc disease with posterior disc osteophyte complex and no significant compression on the ventral thecal sac. Upper chest: Unremarkable Other: None IMPRESSION: No CT evidence of acute fracture malalignment of the cervical spine. Pronounced posterior disc osteophyte complex with central posterior disc protrusion at the C2-C3 level (level of cord injury on prior MR) contacts the ventral thecal sac/cord, contributing to acquired stenosis at this level. Posterior disc osteophyte complex at additional levels of C3-C4, C4-C5, C5-C6 with associated central/ paracentral disc protrusion, as above, also resulting in acquired stenosis. Mild  edema in the retropharyngeal region corresponds to findings on prior MR, favored to represent soft tissue injury in the absence of fracture. Carotid vascular disease. Electronically Signed   By: Corrie Mckusick D.O.   On: 08/13/2017 12:22   Mr Brain Wo Contrast  Result Date: 08/13/2017 CLINICAL DATA:  Weakness in all 4 extremities. Symptomatic bradycardia with hypotension. Syncope. Recent falls. EXAM: MRI HEAD WITHOUT CONTRAST MRI CERVICAL SPINE WITHOUT CONTRAST TECHNIQUE: Multiplanar, multiecho pulse sequences of the brain and surrounding structures, and cervical spine, to include the craniocervical junction and cervicothoracic junction, were obtained without intravenous contrast. COMPARISON:  Head CT 08/13/2017 FINDINGS: MRI HEAD FINDINGS At the request of the stroke neurologist, only axial and coronal diffusion and axial FLAIR imaging was performed. There is no restricted diffusion to indicate acute infarct. The ventricles and sulci are normal. Small T2 hyperintense focus in the left lentiform nucleus is without evidence of significant associated gliosis on FLAIR imaging, favoring a dilated perivascular space over chronic lacunar infarct. No significant cerebral white matter disease is seen for age. No intracranial mass effect or extra-axial fluid collection is identified. MRI CERVICAL SPINE FINDINGS The study is mildly motion degraded. Alignment: Trace retrolisthesis of C4 on C5. Vertebrae: There is abnormal marrow edema throughout much of the C4 and C5 vertebral bodies as well as along the C6 superior endplate. There is mild anterior wedging of the C5 greater than C6 vertebral bodies, and there is also mild central depression of the C4 inferior endplate. There is some T2/STIR signal hyperintensity in the C5-6 disc space which may be traumatic or degenerative, without endplate erosion to specifically suggest infection. Cord: Extensive T2 hyperintensity involving nearly the entire cross-section of the cord at  C2-3. Milder T2 hyperintensity is suspected in the cord at C3-4. There is increased trace diffusion signal in the cord at C2-3, however this is favored to reflect T2 shine through rather than acute infarct given the lack of reduced ADC and the configuration/extensiveness of the cord T2 signal abnormality without preferential gray matter involvement. Posterior Fossa, vertebral arteries, paraspinal tissues: Grossly preserved vertebral artery flow voids. Mild prevertebral edema/fluid from C2-C4. No significant edema suggestive of of posterior ligamentous injury. No discrete anterior or posterior longitudinal ligament disruption is identified. 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 C2-3: Small right central disc protrusion and infolding of the ligamentum flavum result in moderate spinal stenosis with mild cord flattening and mild-to-moderate bilateral neural foraminal stenosis. C3-4: Disc bulging, ankle vertebral spurring, and infolding of the ligamentum flavum result in moderate spinal stenosis and moderate right and mild left neural foraminal stenosis. C4-5: Disc bulging, uncovertebral spurring, and mild infolding of the ligamentum flavum result in moderate spinal stenosis and severe right and mild-to-moderate left neural foraminal stenosis. C5-6: Mild disc bulging and uncovertebral spurring result in moderate to severe right and moderate left neural foraminal stenosis and borderline to mild spinal stenosis. C6-7: Small central disc protrusion without spinal stenosis. Mild bilateral neural foraminal narrowing due to uncovertebral spurring. C7-T1:  Negative. IMPRESSION: 1. Spinal cord edema at C2-3, suspicious for traumatic contusion in the setting of recent falls and congenital and acquired spinal stenosis at this level. This is not felt to be consistent with infarct, although there could be a component of underlying chronic myelopathy secondary to degenerative stenosis. 2.  Mild prevertebral edema from C2-C4 also likely related to recent trauma. Vertebral marrow edema from C4-C6 with mild vertebral body wedging at these levels may also be reflective  of acute fractures versus degenerative edema. Cervical spine CT is recommended to further assess for fracture. 3. Moderate congenital and acquired spinal stenosis from C2-3 to C4-5. Moderate to severe multilevel neural foraminal stenosis as above. These results were called by telephone at the time of interpretation on 08/13/2017 at 9:23 am to Dr. Cheral Marker, who verbally acknowledged these results. Electronically Signed   By: Logan Bores M.D.   On: 08/13/2017 09:33   Mr Cervical Spine Wo Contrast  Result Date: 08/13/2017 CLINICAL DATA:  Weakness in all 4 extremities. Symptomatic bradycardia with hypotension. Syncope. Recent falls. EXAM: MRI HEAD WITHOUT CONTRAST MRI CERVICAL SPINE WITHOUT CONTRAST TECHNIQUE: Multiplanar, multiecho pulse sequences of the brain and surrounding structures, and cervical spine, to include the craniocervical junction and cervicothoracic junction, were obtained without intravenous contrast. COMPARISON:  Head CT 08/13/2017 FINDINGS: MRI HEAD FINDINGS At the request of the stroke neurologist, only axial and coronal diffusion and axial FLAIR imaging was performed. There is no restricted diffusion to indicate acute infarct. The ventricles and sulci are normal. Small T2 hyperintense focus in the left lentiform nucleus is without evidence of significant associated gliosis on FLAIR imaging, favoring a dilated perivascular space over chronic lacunar infarct. No significant cerebral white matter disease is seen for age. No intracranial mass effect or extra-axial fluid collection is identified. MRI CERVICAL SPINE FINDINGS The study is mildly motion degraded. Alignment: Trace retrolisthesis of C4 on C5. Vertebrae: There is abnormal marrow edema throughout much of the C4 and C5 vertebral bodies as well as along the C6  superior endplate. There is mild anterior wedging of the C5 greater than C6 vertebral bodies, and there is also mild central depression of the C4 inferior endplate. There is some T2/STIR signal hyperintensity in the C5-6 disc space which may be traumatic or degenerative, without endplate erosion to specifically suggest infection. Cord: Extensive T2 hyperintensity involving nearly the entire cross-section of the cord at C2-3. Milder T2 hyperintensity is suspected in the cord at C3-4. There is increased trace diffusion signal in the cord at C2-3, however this is favored to reflect T2 shine through rather than acute infarct given the lack of reduced ADC and the configuration/extensiveness of the cord T2 signal abnormality without preferential gray matter involvement. Posterior Fossa, vertebral arteries, paraspinal tissues: Grossly preserved vertebral artery flow voids. Mild prevertebral edema/fluid from C2-C4. No significant edema suggestive of of posterior ligamentous injury. No discrete anterior or posterior longitudinal ligament disruption is identified. 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 C2-3: Small right central disc protrusion and infolding of the ligamentum flavum result in moderate spinal stenosis with mild cord flattening and mild-to-moderate bilateral neural foraminal stenosis. C3-4: Disc bulging, ankle vertebral spurring, and infolding of the ligamentum flavum result in moderate spinal stenosis and moderate right and mild left neural foraminal stenosis. C4-5: Disc bulging, uncovertebral spurring, and mild infolding of the ligamentum flavum result in moderate spinal stenosis and severe right and mild-to-moderate left neural foraminal stenosis. C5-6: Mild disc bulging and uncovertebral spurring result in moderate to severe right and moderate left neural foraminal stenosis and borderline to mild spinal stenosis. C6-7: Small central disc protrusion without spinal  stenosis. Mild bilateral neural foraminal narrowing due to uncovertebral spurring. C7-T1:  Negative. IMPRESSION: 1. Spinal cord edema at C2-3, suspicious for traumatic contusion in the setting of recent falls and congenital and acquired spinal stenosis at this level. This is not felt to be consistent with infarct, although there could be a component of  underlying chronic myelopathy secondary to degenerative stenosis. 2. Mild prevertebral edema from C2-C4 also likely related to recent trauma. Vertebral marrow edema from C4-C6 with mild vertebral body wedging at these levels may also be reflective of acute fractures versus degenerative edema. Cervical spine CT is recommended to further assess for fracture. 3. Moderate congenital and acquired spinal stenosis from C2-3 to C4-5. Moderate to severe multilevel neural foraminal stenosis as above. These results were called by telephone at the time of interpretation on 08/13/2017 at 9:23 am to Dr. Cheral Marker, who verbally acknowledged these results. Electronically Signed   By: Logan Bores M.D.   On: 08/13/2017 09:33   Dg Chest Portable 1 View  Result Date: 08/12/2017 CLINICAL DATA:  Hypotension. EXAM: PORTABLE CHEST 1 VIEW COMPARISON:  None. FINDINGS: Mild cardiomegaly is noted. No pneumothorax or pleural effusion is noted. Both lungs are clear. The visualized skeletal structures are unremarkable. IMPRESSION: No acute cardiopulmonary abnormality seen. Electronically Signed   By: Marijo Conception, M.D.   On: 08/12/2017 16:44   Dg Shoulder Left Port  Result Date: 08/12/2017 CLINICAL DATA:  71 year old male with multiple recent falls. Left shoulder and hip pain and limited range of motion. EXAM: LEFT SHOULDER - 1 VIEW COMPARISON:  None. FINDINGS: There is no evidence of fracture or dislocation. There is no evidence of arthropathy or other focal bone abnormality. Soft tissues are unremarkable. IMPRESSION: Negative. Electronically Signed   By: Kristopher Oppenheim M.D.   On:  08/12/2017 20:37   Dg Hip Port Unilat With Pelvis 1v Left  Result Date: 08/12/2017 CLINICAL DATA:  71 year old male with multiple falls recently. Pain to left shoulder and hip with limited range of motion. EXAM: DG HIP (WITH OR WITHOUT PELVIS) 1V PORT LEFT COMPARISON:  None. FINDINGS: There is no evidence of hip fracture or dislocation. There is no evidence of arthropathy or other focal bone abnormality. IMPRESSION: No definite radiographic evidence of acute fracture or dislocation. Electronically Signed   By: Kristopher Oppenheim M.D.   On: 08/12/2017 20:36   Ct Head Code Stroke Wo Contrast`  Result Date: 08/13/2017 CLINICAL DATA:  Code stroke. Bilateral upper and lower extremity weakness. EXAM: CT HEAD WITHOUT CONTRAST TECHNIQUE: Contiguous axial images were obtained from the base of the skull through the vertex without intravenous contrast. COMPARISON:  None. FINDINGS: Brain: There is an 8 mm focus of mild hypoattenuation in the left ventrolateral pons (series 3, images 8 and 9). There is no evidence of acute cortically based supratentorial infarct, intracranial hemorrhage, mass, midline shift, or extra-axial fluid collection. The ventricles and sulci are normal. Vascular: Calcified atherosclerosis at the skullbase. No hyperdense vessel. Skull: No fracture or focal osseous lesion. Sinuses/Orbits: Visualized paranasal sinuses are clear. Minimal chronic appearing left mastoid air cell opacification. Unremarkable orbits. Other: None. ASPECTS [facility] Stroke Program Early CT Score) Not scored due to non-localizing symptoms. IMPRESSION: 1. 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 2. No evidence of acute supratentorial infarct or hemorrhage. Results sent via text page to Dr. Lorraine Lax on 08/13/2017 at 6:52 a.m. Electronically Signed   By: Logan Bores M.D.   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 Of the spinal cord injuries, this one carries the best  prognosis for some functional recovery, but he is fairly densely plegic in the upper extremities  He will need therapy and rehabilitation/placement.   I do not recommend early surgical intervention as he does not have ongoing cord compression and therefore  early surgery likely will not change the neurologic outcome, and I would like to see him back in the office 2 weeks after discharge from the hospital. I will probably repeat the MRI of the cervical spine prior to considering any type of surgical intervention, and we would have to consider the risks and benefits of the surgery at that time based on the imaging and his prognosis for functional recovery.   LOS: 2 days    Aleera Gilcrease S 08/14/2017, 8:24 AM

## 2017-08-14 NOTE — Progress Notes (Signed)
Subjective:  Hips hurt can move lesgs but not arms   Objective:  Vitals:   08/14/17 0400 08/14/17 0500 08/14/17 0600 08/14/17 0700  BP: 130/60 126/61 136/69 (!) 141/65  Pulse: (!) 43 (!) 51 (!) 43 (!) 44  Resp: 16 15 15 13   Temp:      TempSrc:      SpO2: 99% 99% 100% 100%    Intake/Output from previous day:  Intake/Output Summary (Last 24 hours) at 08/14/2017 0800 Last data filed at 08/14/2017 0700 Gross per 24 hour  Intake 2348.13 ml  Output 945 ml  Net 1403.13 ml    Physical Exam: Schizo affective  Thin black male  HEENT: normal Neck supple with no adenopathy JVP normal no bruits no thyromegaly Lungs clear with no wheezing and good diaphragmatic motion Heart:  S1/S2 no murmur, no rub, gallop or click PMI normal Abdomen: benighn, BS positve, no tenderness, no AAA no bruit.  No HSM or HJR Distal pulses intact with no bruits No edema Neuro flaccid upper extremities foley for neurogenic bladder  Skin warm and dry No muscular weakness    Lab Results: Basic Metabolic Panel: Recent Labs    08/13/17 0824 08/14/17 0649  NA 137 136  K 3.1* 3.8  CL 113* 112*  CO2 18* 20*  GLUCOSE 112* 103*  BUN 12 12  CREATININE 0.76 0.71  CALCIUM 8.1* 8.3*   Liver Function Tests: Recent Labs    08/12/17 1547  AST 20  ALT 13*  ALKPHOS 73  BILITOT 0.6  PROT 6.7  ALBUMIN 3.8   No results for input(s): LIPASE, AMYLASE in the last 72 hours. CBC: Recent Labs    08/12/17 1547  08/12/17 1920 08/13/17 0824  WBC 4.1  --  7.1 6.4  NEUTROABS 2.3  --   --   --   HGB 10.8*   < > 10.6* 10.7*  HCT 35.0*   < > 33.1* 32.4*  MCV 90.0  --  86.2 85.5  PLT 220  --  224 205   < > = values in this interval not displayed.   Cardiac Enzymes: Recent Labs    08/12/17 1920 08/13/17 0547 08/13/17 0824  TROPONINI <0.03 <0.03 <0.03   BNP: Invalid input(s): POCBNP D-Dimer: No results for input(s): DDIMER in the last 72 hours. Hemoglobin A1C: No results for input(s):  HGBA1C in the last 72 hours. Fasting Lipid Panel: No results for input(s): CHOL, HDL, LDLCALC, TRIG, CHOLHDL, LDLDIRECT in the last 72 hours. Thyroid Function Tests: Recent Labs    08/12/17 1547  TSH 4.926*   Anemia Panel: No results for input(s): VITAMINB12, FOLATE, FERRITIN, TIBC, IRON, RETICCTPCT in the last 72 hours.  Imaging: Ct Cervical Spine Wo Contrast  Result Date: 08/13/2017 CLINICAL DATA:  71 year old male with a history of fall and cervical cord edema on MRI EXAM: CT CERVICAL SPINE WITHOUT CONTRAST TECHNIQUE: Multidetector CT imaging of the cervical spine was performed without intravenous contrast. Multiplanar CT image reconstructions were also generated. COMPARISON:  None. FINDINGS: Alignment: Cervical vertebral bodies maintain alignment with no subluxation, retrolisthesis, anterolisthesis. Skull base and vertebrae: Craniocervical junction aligned. No skullbase fracture identified. Similar configuration of vertebral bodies compared to the MR with mild body height loss of C4 and C5. No fracture line identified. Soft tissues and spinal canal: No hyperdense material within the spinal canal. Calcifications of the bilateral carotid system. No focal fluid collection. Questionable trace low-density edema in the retropharyngeal soft tissues, corresponds to findings on prior MRI. Disc  levels: C2-C3: Posterior disc osteophyte complex with central posterior disc protrusion appearing to contact the ventral aspect of the cord. Uncovertebral joint disease contributes to minimal bilateral neural foraminal narrowing. C3-C4: Posterior disc osteophyte complex with right eccentric disc bulge/protrusion appears to contact the ventral thecal sac. Bilateral uncovertebral joint disease contributes to minimal neural foraminal narrowing. C4-C5: Posterior disc osteophyte complex with central disc bulge/protrusion appears to contact the ventral thecal sac/cord. Uncovertebral joint disease contributes to minimal  bilateral neural foraminal narrowing. C5-C6: Posterior disc osteophyte complex with left eccentric disc bulge/protrusion appears to contact the left aspect of the thecal sac. No significant neural foraminal narrowing. C6-C7: Mild degenerative disc disease with posterior disc osteophyte complex and no significant compression on the ventral thecal sac. Upper chest: Unremarkable Other: None IMPRESSION: No CT evidence of acute fracture malalignment of the cervical spine. Pronounced posterior disc osteophyte complex with central posterior disc protrusion at the C2-C3 level (level of cord injury on prior MR) contacts the ventral thecal sac/cord, contributing to acquired stenosis at this level. Posterior disc osteophyte complex at additional levels of C3-C4, C4-C5, C5-C6 with associated central/ paracentral disc protrusion, as above, also resulting in acquired stenosis. Mild edema in the retropharyngeal region corresponds to findings on prior MR, favored to represent soft tissue injury in the absence of fracture. Carotid vascular disease. Electronically Signed   By: Corrie Mckusick D.O.   On: 08/13/2017 12:22   Mr Brain Wo Contrast  Result Date: 08/13/2017 CLINICAL DATA:  Weakness in all 4 extremities. Symptomatic bradycardia with hypotension. Syncope. Recent falls. EXAM: MRI HEAD WITHOUT CONTRAST MRI CERVICAL SPINE WITHOUT CONTRAST TECHNIQUE: Multiplanar, multiecho pulse sequences of the brain and surrounding structures, and cervical spine, to include the craniocervical junction and cervicothoracic junction, were obtained without intravenous contrast. COMPARISON:  Head CT 08/13/2017 FINDINGS: MRI HEAD FINDINGS At the request of the stroke neurologist, only axial and coronal diffusion and axial FLAIR imaging was performed. There is no restricted diffusion to indicate acute infarct. The ventricles and sulci are normal. Small T2 hyperintense focus in the left lentiform nucleus is without evidence of significant associated  gliosis on FLAIR imaging, favoring a dilated perivascular space over chronic lacunar infarct. No significant cerebral white matter disease is seen for age. No intracranial mass effect or extra-axial fluid collection is identified. MRI CERVICAL SPINE FINDINGS The study is mildly motion degraded. Alignment: Trace retrolisthesis of C4 on C5. Vertebrae: There is abnormal marrow edema throughout much of the C4 and C5 vertebral bodies as well as along the C6 superior endplate. There is mild anterior wedging of the C5 greater than C6 vertebral bodies, and there is also mild central depression of the C4 inferior endplate. There is some T2/STIR signal hyperintensity in the C5-6 disc space which may be traumatic or degenerative, without endplate erosion to specifically suggest infection. Cord: Extensive T2 hyperintensity involving nearly the entire cross-section of the cord at C2-3. Milder T2 hyperintensity is suspected in the cord at C3-4. There is increased trace diffusion signal in the cord at C2-3, however this is favored to reflect T2 shine through rather than acute infarct given the lack of reduced ADC and the configuration/extensiveness of the cord T2 signal abnormality without preferential gray matter involvement. Posterior Fossa, vertebral arteries, paraspinal tissues: Grossly preserved vertebral artery flow voids. Mild prevertebral edema/fluid from C2-C4. No significant edema suggestive of of posterior ligamentous injury. No discrete anterior or posterior longitudinal ligament disruption is identified. 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  C2-3: Small right central disc protrusion and infolding of the ligamentum flavum result in moderate spinal stenosis with mild cord flattening and mild-to-moderate bilateral neural foraminal stenosis. C3-4: Disc bulging, ankle vertebral spurring, and infolding of the ligamentum flavum result in moderate spinal stenosis and moderate right  and mild left neural foraminal stenosis. C4-5: Disc bulging, uncovertebral spurring, and mild infolding of the ligamentum flavum result in moderate spinal stenosis and severe right and mild-to-moderate left neural foraminal stenosis. C5-6: Mild disc bulging and uncovertebral spurring result in moderate to severe right and moderate left neural foraminal stenosis and borderline to mild spinal stenosis. C6-7: Small central disc protrusion without spinal stenosis. Mild bilateral neural foraminal narrowing due to uncovertebral spurring. C7-T1:  Negative. IMPRESSION: 1. Spinal cord edema at C2-3, suspicious for traumatic contusion in the setting of recent falls and congenital and acquired spinal stenosis at this level. This is not felt to be consistent with infarct, although there could be a component of underlying chronic myelopathy secondary to degenerative stenosis. 2. Mild prevertebral edema from C2-C4 also likely related to recent trauma. Vertebral marrow edema from C4-C6 with mild vertebral body wedging at these levels may also be reflective of acute fractures versus degenerative edema. Cervical spine CT is recommended to further assess for fracture. 3. Moderate congenital and acquired spinal stenosis from C2-3 to C4-5. Moderate to severe multilevel neural foraminal stenosis as above. These results were called by telephone at the time of interpretation on 08/13/2017 at 9:23 am to Dr. Cheral Marker, who verbally acknowledged these results. Electronically Signed   By: Logan Bores M.D.   On: 08/13/2017 09:33   Mr Cervical Spine Wo Contrast  Result Date: 08/13/2017 CLINICAL DATA:  Weakness in all 4 extremities. Symptomatic bradycardia with hypotension. Syncope. Recent falls. EXAM: MRI HEAD WITHOUT CONTRAST MRI CERVICAL SPINE WITHOUT CONTRAST TECHNIQUE: Multiplanar, multiecho pulse sequences of the brain and surrounding structures, and cervical spine, to include the craniocervical junction and cervicothoracic junction,  were obtained without intravenous contrast. COMPARISON:  Head CT 08/13/2017 FINDINGS: MRI HEAD FINDINGS At the request of the stroke neurologist, only axial and coronal diffusion and axial FLAIR imaging was performed. There is no restricted diffusion to indicate acute infarct. The ventricles and sulci are normal. Small T2 hyperintense focus in the left lentiform nucleus is without evidence of significant associated gliosis on FLAIR imaging, favoring a dilated perivascular space over chronic lacunar infarct. No significant cerebral white matter disease is seen for age. No intracranial mass effect or extra-axial fluid collection is identified. MRI CERVICAL SPINE FINDINGS The study is mildly motion degraded. Alignment: Trace retrolisthesis of C4 on C5. Vertebrae: There is abnormal marrow edema throughout much of the C4 and C5 vertebral bodies as well as along the C6 superior endplate. There is mild anterior wedging of the C5 greater than C6 vertebral bodies, and there is also mild central depression of the C4 inferior endplate. There is some T2/STIR signal hyperintensity in the C5-6 disc space which may be traumatic or degenerative, without endplate erosion to specifically suggest infection. Cord: Extensive T2 hyperintensity involving nearly the entire cross-section of the cord at C2-3. Milder T2 hyperintensity is suspected in the cord at C3-4. There is increased trace diffusion signal in the cord at C2-3, however this is favored to reflect T2 shine through rather than acute infarct given the lack of reduced ADC and the configuration/extensiveness of the cord T2 signal abnormality without preferential gray matter involvement. Posterior Fossa, vertebral arteries, paraspinal tissues: Grossly preserved vertebral artery flow voids.  Mild prevertebral edema/fluid from C2-C4. No significant edema suggestive of of posterior ligamentous injury. No discrete anterior or posterior longitudinal ligament disruption is identified.  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 C2-3: Small right central disc protrusion and infolding of the ligamentum flavum result in moderate spinal stenosis with mild cord flattening and mild-to-moderate bilateral neural foraminal stenosis. C3-4: Disc bulging, ankle vertebral spurring, and infolding of the ligamentum flavum result in moderate spinal stenosis and moderate right and mild left neural foraminal stenosis. C4-5: Disc bulging, uncovertebral spurring, and mild infolding of the ligamentum flavum result in moderate spinal stenosis and severe right and mild-to-moderate left neural foraminal stenosis. C5-6: Mild disc bulging and uncovertebral spurring result in moderate to severe right and moderate left neural foraminal stenosis and borderline to mild spinal stenosis. C6-7: Small central disc protrusion without spinal stenosis. Mild bilateral neural foraminal narrowing due to uncovertebral spurring. C7-T1:  Negative. IMPRESSION: 1. Spinal cord edema at C2-3, suspicious for traumatic contusion in the setting of recent falls and congenital and acquired spinal stenosis at this level. This is not felt to be consistent with infarct, although there could be a component of underlying chronic myelopathy secondary to degenerative stenosis. 2. Mild prevertebral edema from C2-C4 also likely related to recent trauma. Vertebral marrow edema from C4-C6 with mild vertebral body wedging at these levels may also be reflective of acute fractures versus degenerative edema. Cervical spine CT is recommended to further assess for fracture. 3. Moderate congenital and acquired spinal stenosis from C2-3 to C4-5. Moderate to severe multilevel neural foraminal stenosis as above. These results were called by telephone at the time of interpretation on 08/13/2017 at 9:23 am to Dr. Cheral Marker, who verbally acknowledged these results. Electronically Signed   By: Logan Bores M.D.   On: 08/13/2017 09:33     Dg Chest Portable 1 View  Result Date: 08/12/2017 CLINICAL DATA:  Hypotension. EXAM: PORTABLE CHEST 1 VIEW COMPARISON:  None. FINDINGS: Mild cardiomegaly is noted. No pneumothorax or pleural effusion is noted. Both lungs are clear. The visualized skeletal structures are unremarkable. IMPRESSION: No acute cardiopulmonary abnormality seen. Electronically Signed   By: Marijo Conception, M.D.   On: 08/12/2017 16:44   Dg Shoulder Left Port  Result Date: 08/12/2017 CLINICAL DATA:  71 year old male with multiple recent falls. Left shoulder and hip pain and limited range of motion. EXAM: LEFT SHOULDER - 1 VIEW COMPARISON:  None. FINDINGS: There is no evidence of fracture or dislocation. There is no evidence of arthropathy or other focal bone abnormality. Soft tissues are unremarkable. IMPRESSION: Negative. Electronically Signed   By: Kristopher Oppenheim M.D.   On: 08/12/2017 20:37   Dg Hip Port Unilat With Pelvis 1v Left  Result Date: 08/12/2017 CLINICAL DATA:  71 year old male with multiple falls recently. Pain to left shoulder and hip with limited range of motion. EXAM: DG HIP (WITH OR WITHOUT PELVIS) 1V PORT LEFT COMPARISON:  None. FINDINGS: There is no evidence of hip fracture or dislocation. There is no evidence of arthropathy or other focal bone abnormality. IMPRESSION: No definite radiographic evidence of acute fracture or dislocation. Electronically Signed   By: Kristopher Oppenheim M.D.   On: 08/12/2017 20:36   Ct Head Code Stroke Wo Contrast`  Result Date: 08/13/2017 CLINICAL DATA:  Code stroke. Bilateral upper and lower extremity weakness. EXAM: CT HEAD WITHOUT CONTRAST TECHNIQUE: Contiguous axial images were obtained from the base of the skull through the vertex without intravenous contrast. COMPARISON:  None.  FINDINGS: Brain: There is an 8 mm focus of mild hypoattenuation in the left ventrolateral pons (series 3, images 8 and 9). There is no evidence of acute cortically based supratentorial infarct,  intracranial hemorrhage, mass, midline shift, or extra-axial fluid collection. The ventricles and sulci are normal. Vascular: Calcified atherosclerosis at the skullbase. No hyperdense vessel. Skull: No fracture or focal osseous lesion. Sinuses/Orbits: Visualized paranasal sinuses are clear. Minimal chronic appearing left mastoid air cell opacification. Unremarkable orbits. Other: None. ASPECTS [facility] Stroke Program Early CT Score) Not scored due to non-localizing symptoms. IMPRESSION: 1. 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 2. No evidence of acute supratentorial infarct or hemorrhage. Results sent via text page to Dr. Lorraine Lax on 08/13/2017 at 6:52 a.m. Electronically Signed   By: Logan Bores M.D.   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 aspirin EC  81 mg Oral Daily  . heparin  5,000 Units Subcutaneous Q8H  . potassium chloride  40 mEq Oral BID     . sodium chloride 100 mL/hr at 08/13/17 1800  . 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

## 2017-08-14 NOTE — Progress Notes (Signed)
PT Cancellation Note  Patient Details Name: Dean Oliver MRN: [id number] DOB: 1945-11-08   Cancelled Treatment:    Reason Eval/Treat Not Completed: Patient not medically ready.   Pt with active bedrest orders. Will await increase in activity orders prior to initiating PT evaluation. Thank you for this referral!     Despina Pole 08/14/2017, 9:41 AM  Carita Pian. Sanjuana Kava, [place] Pager [phone]

## 2017-08-14 NOTE — Progress Notes (Signed)
OT Cancellation Note  Patient Details Name: Dean Oliver MRN: [id number] DOB: 04/08/1946   Cancelled Treatment:    Reason Eval/Treat Not Completed: Patient not medically ready. Pt with active bedrest orders. Will await increase in activity orders prior to initiating OT evaluation. Thank you for this referral!  Norman Herrlich, MS OTR/L  Pager: [place] A Ruston Fedora 08/14/2017, 6:48 AM

## 2017-08-15 DIAGNOSIS — G8220 Paraplegia, unspecified: Secondary | ICD-10-CM | POA: Diagnosis not present

## 2017-08-15 DIAGNOSIS — R001 Bradycardia, unspecified: Secondary | ICD-10-CM | POA: Diagnosis not present

## 2017-08-15 DIAGNOSIS — G8250 Quadriplegia, unspecified: Secondary | ICD-10-CM | POA: Diagnosis not present

## 2017-08-15 DIAGNOSIS — G903 Multi-system degeneration of the autonomic nervous system: Secondary | ICD-10-CM | POA: Diagnosis not present

## 2017-08-15 DIAGNOSIS — M5000 Cervical disc disorder with myelopathy, unspecified cervical region: Secondary | ICD-10-CM | POA: Diagnosis not present

## 2017-08-15 DIAGNOSIS — E871 Hypo-osmolality and hyponatremia: Secondary | ICD-10-CM | POA: Diagnosis not present

## 2017-08-15 DIAGNOSIS — D62 Acute posthemorrhagic anemia: Secondary | ICD-10-CM | POA: Diagnosis not present

## 2017-08-15 DIAGNOSIS — S14122A Central cord syndrome at C2 level of cervical spinal cord, initial encounter: Secondary | ICD-10-CM | POA: Diagnosis not present

## 2017-08-15 NOTE — Evaluation (Signed)
Physical Therapy Evaluation Patient Details Name: Dean Oliver MRN: [id number] DOB: March 28, 1946 Today's Date: 08/15/2017   History of Present Illness  Pt is a 71 y.o. male admitted on 08/12/17 with c/o increased weakness after a fall; pt resides at a group home with baseline cognitive impairments and is a difficult historian. Cervical CT showed no acute fx; MRI showed acute C2-3 spinal cord contusion. Neurology suspects central cord syndrome; neurosurgery determined pt is not a candidate for early surgical intervention. Pertinent PMH includes HTN, bilateral cataracts, schizophrenia.     Clinical Impression  Pt presents with increased weakness, decreased activity tolerance, and an overall decrease in functional mobility secondary to above. PTA, pt lives at group home; pt poor historian with baseline of cognitive impairment, but reports indep with ambulation and ADLs. Today, pt required max-totalA+2 for mobility. Able to sit EOB with intermittent min guard, requiring cues for trunk activation. Feel pt would benefit from continued acute PT services to maximize functional mobility and independence prior to d/c with CIR-level therapies specifically focused on SCI rehab.     Follow Up Recommendations CIR;Other (comment)(SCI rehab)    Equipment Recommendations  Other (comment)(TBD)    Recommendations for Other Services       Precautions / Restrictions Precautions Precautions: Fall;Cervical Required Braces or Orthoses: Cervical Brace Cervical Brace: Hard collar;Other (comment)(Ok to apply in sitting) Restrictions Weight Bearing Restrictions: No      Mobility  Bed Mobility Overal bed mobility: Needs Assistance Bed Mobility: Rolling;Supine to Sit;Sit to Supine Rolling: +2 for physical assistance;Max assist   Supine to sit: Total assist;+2 for physical assistance Sit to supine: Total assist;+2 for physical assistance   General bed mobility comments: MaxA to roll as pt able to assist with  some hip flexion; totalA for sit<>supine  Transfers Overall transfer level: Needs assistance Equipment used: None Transfers: Sit to/from Stand Sit to Stand: Total assist;+2 physical assistance         General transfer comment: Unable to achieve fully upright standing with totalA (+2); pt able to initiate some hip extension (grossly 2/5)  Ambulation/Gait                Stairs            Wheelchair Mobility    Modified Rankin (Stroke Patients Only)       Balance Overall balance assessment: Needs assistance Sitting-balance support: No upper extremity supported;Feet supported Sitting balance-Leahy Scale: Fair Sitting balance - Comments: Pt able to maintain balance for ~10sec at a time with cues for anterior/lateral lean; other times requiring maxA for balance (easily fatigued) Postural control: Posterior lean;Left lateral lean   Standing balance-Leahy Scale: Zero                               Pertinent Vitals/Pain Pain Assessment: Faces Faces Pain Scale: Hurts little more Pain Location: posterior neck, shoulders (R>L) Pain Descriptors / Indicators: Discomfort;Sore Pain Intervention(s): Monitored during session;Repositioned    Home Living Family/patient expects to be discharged to:: Group home                 Additional Comments: Pt poor historian; reports from group home, does not work    Prior Function Level of Independence: Needs assistance   Gait / Transfers Assistance Needed: Indep with ambulation  ADL's / Homemaking Assistance Needed: Indep for bathing and dressing; sounds like group home provides meals        Hand Dominance  Extremity/Trunk Assessment   Upper Extremity Assessment Upper Extremity Assessment: Defer to OT evaluation;RUE deficits/detail;LUE deficits/detail    Lower Extremity Assessment Lower Extremity Assessment: RLE deficits/detail;LLE deficits/detail RLE Deficits / Details: R hip flex grossly  2/5, knee ext 3/5, knee flex 3/5, ankle DF/PF 0/5; able to localize light touch sensation, but reports it feels numb RLE Sensation: decreased light touch RLE Coordination: decreased fine motor;decreased gross motor LLE Deficits / Details: L hip flex grossly 2/5, knee ext 3/5, knee flex 3/5, ankle DF/PF 0/5; able to localize light touch sensation, but reports it feels numb LLE Sensation: decreased light touch LLE Coordination: decreased fine motor;decreased gross motor    Cervical / Trunk Assessment Cervical / Trunk Assessment: Kyphotic(Increased lateral deviation of C-spine towards R-side (significant neck/scap tightness))  Communication   Communication: Expressive difficulties  Cognition Arousal/Alertness: Awake/alert Behavior During Therapy: WFL for tasks assessed/performed Overall Cognitive Status: History of cognitive impairments - at baseline                                 General Comments: Appropriately interactive with PT/OT. Able to answer questions regarding PLOF, but unsure of accuracy. Able to follow one step commands with intermittent cues. Increased difficulty with sensation testing, simply stating "feels good" when asked about BUE/LE sensation and asked to elaborate      General Comments General comments (skin integrity, edema, etc.): BP: rest 126/67, sitting 136/76, sitting ~5 min 143/77, return to supine 143/79. Pt c/o slight dizziness with position changes    Exercises     Assessment/Plan    PT Assessment Patient needs continued PT services  PT Problem List Decreased strength;Decreased range of motion;Decreased activity tolerance;Decreased balance;Decreased mobility;Decreased cognition;Decreased knowledge of use of DME;Impaired sensation;Decreased skin integrity       PT Treatment Interventions DME instruction;Gait training;Stair training;Functional mobility training;Therapeutic activities;Therapeutic exercise;Balance training;Patient/family  education;Wheelchair mobility training    PT Goals (Current goals can be found in the Care Plan section)  Acute Rehab PT Goals Patient Stated Goal: None stated PT Goal Formulation: With patient Time For Goal Achievement: 08/29/17    Frequency Min 3X/week   Barriers to discharge        Co-evaluation               AM-PAC PT "6 Clicks" Daily Activity  Outcome Measure Difficulty turning over in bed (including adjusting bedclothes, sheets and blankets)?: Unable Difficulty moving from lying on back to sitting on the side of the bed? : Unable Difficulty sitting down on and standing up from a chair with arms (e.g., wheelchair, bedside commode, etc,.)?: Unable Help needed moving to and from a bed to chair (including a wheelchair)?: Total Help needed walking in hospital room?: Total Help needed climbing 3-5 steps with a railing? : Total 6 Click Score: 6    End of Session Equipment Utilized During Treatment: Gait belt Activity Tolerance: Patient tolerated treatment well Patient left: in bed;with call bell/phone within reach;Other (comment)(pt unable to use call bell; RN aware) Nurse Communication: Mobility status PT Visit Diagnosis: Other abnormalities of gait and mobility (R26.89);Other symptoms and signs involving the nervous system (R29.898)    Time: 1191-4782 PT Time Calculation (min) (ACUTE ONLY): 40 min   Charges:   PT Evaluation $PT Eval Moderate Complexity: 1 Mod PT Treatments $Therapeutic Activity: 8-22 mins   PT G Codes:       [name], PT, DPT Acute Rehab Services  Pager: [phone]  Ellison Hughs  L Shoua Ulloa 08/15/2017, 3:57 PM

## 2017-08-15 NOTE — Plan of Care (Signed)
  Activity: Risk for activity intolerance will decrease 08/15/2017 1523 - Progressing by Jenne Campus, RN  Physical therapy working with pt today

## 2017-08-15 NOTE — Progress Notes (Addendum)
Progress Note  Patient Name: Dean Oliver Date of Encounter: 08/15/2017  Primary Cardiologist: Dr Johnsie Cancel  Subjective   Has some pain in the legs.   Inpatient Medications    Scheduled Meds: . aspirin EC  81 mg Oral Daily  . heparin  5,000 Units Subcutaneous Q8H  . potassium chloride  40 mEq Oral BID   Continuous Infusions: . sodium chloride 100 mL/hr at 08/14/17 1400  . norepinephrine (LEVOPHED) Adult infusion Stopped (08/13/17 1200)   PRN Meds: acetaminophen, ALPRAZolam, MUSCLE RUB, nitroGLYCERIN, ondansetron (ZOFRAN) IV, zolpidem   Vital Signs    Vitals:   08/15/17 0400 08/15/17 0700 08/15/17 0754 08/15/17 0800  BP: 140/67 (!) 145/74  (!) 148/73  Pulse: (!) 45 (!) 51  (!) 52  Resp: 10 19  14   Temp: 99 F (37.2 C)  99.5 F (37.5 C)   TempSrc: Oral  Oral   SpO2: 99% 99%  99%    Intake/Output Summary (Last 24 hours) at 08/15/2017 0916 Last data filed at 08/15/2017 0848 Gross per 24 hour  Intake 3760 ml  Output 650 ml  Net 3110 ml   There were no vitals filed for this visit.  Telemetry    SB to SR - Personally Reviewed  Physical Exam   GEN: No acute distress.  AAO x 3 Neck: No JVD Cardiac: RRR, no murmurs, rubs, or gallops.  Respiratory: Clear to auscultation bilaterally. GI: Soft, nontender, non-distended  MS: No edema; No deformity. Neuro:  unable to move arms, minimally moving legs Psych: Normal affect   Labs    Chemistry Recent Labs  Lab 08/12/17 1547 08/12/17 1621 08/12/17 1920 08/13/17 0824 08/14/17 0649  NA 137 139  --  137 136  K 3.3* 3.8  --  3.1* 3.8  CL 111 113*  --  113* 112*  CO2 21*  --   --  18* 20*  GLUCOSE 116* 125*  --  112* 103*  BUN 17 15  --  12 12  CREATININE 1.07 1.00 0.94 0.76 0.71  CALCIUM 8.9  --   --  8.1* 8.3*  PROT 6.7  --   --   --   --   ALBUMIN 3.8  --   --   --   --   AST 20  --   --   --   --   ALT 13*  --   --   --   --   ALKPHOS 73  --   --   --   --   BILITOT 0.6  --   --   --   --   GFRNONAA  >60  --  >60 >60 >60  GFRAA >60  --  >60 >60 >60  ANIONGAP 5  --   --  6 4*    Hematology Recent Labs  Lab 08/12/17 1547 08/12/17 1621 08/12/17 1920 08/13/17 0824  WBC 4.1  --  7.1 6.4  RBC 3.89*  --  3.84* 3.79*  HGB 10.8* 9.9* 10.6* 10.7*  HCT 35.0* 29.0* 33.1* 32.4*  MCV 90.0  --  86.2 85.5  MCH 27.8  --  27.6 28.2  MCHC 30.9  --  32.0 33.0  RDW 13.2  --  13.0 13.2  PLT 220  --  224 205   Cardiac Enzymes Recent Labs  Lab 08/12/17 1547 08/12/17 1920 08/13/17 0547 08/13/17 0824  TROPONINI <0.03 <0.03 <0.03 <0.03    Recent Labs  Lab 08/12/17 1619  TROPIPOC 0.00  BNPNo results for input(s): BNP, PROBNP in the last 168 hours.   DDimer No results for input(s): DDIMER in the last 168 hours.   Radiology    Ct Cervical Spine Wo Contrast  Result Date: 08/13/2017 CLINICAL DATA:  71 year old male with a history of fall and cervical cord edema on MRI EXAM: CT CERVICAL SPINE WITHOUT CONTRAST TECHNIQUE: Multidetector CT imaging of the cervical spine was performed without intravenous contrast. Multiplanar CT image reconstructions were also generated. COMPARISON:  None. FINDINGS: Alignment: Cervical vertebral bodies maintain alignment with no subluxation, retrolisthesis, anterolisthesis. Skull base and vertebrae: Craniocervical junction aligned. No skullbase fracture identified. Similar configuration of vertebral bodies compared to the MR with mild body height loss of C4 and C5. No fracture line identified. Soft tissues and spinal canal: No hyperdense material within the spinal canal. Calcifications of the bilateral carotid system. No focal fluid collection. Questionable trace low-density edema in the retropharyngeal soft tissues, corresponds to findings on prior MRI. Disc levels: C2-C3: Posterior disc osteophyte complex with central posterior disc protrusion appearing to contact the ventral aspect of the cord. Uncovertebral joint disease contributes to minimal bilateral neural  foraminal narrowing. C3-C4: Posterior disc osteophyte complex with right eccentric disc bulge/protrusion appears to contact the ventral thecal sac. Bilateral uncovertebral joint disease contributes to minimal neural foraminal narrowing. C4-C5: Posterior disc osteophyte complex with central disc bulge/protrusion appears to contact the ventral thecal sac/cord. Uncovertebral joint disease contributes to minimal bilateral neural foraminal narrowing. C5-C6: Posterior disc osteophyte complex with left eccentric disc bulge/protrusion appears to contact the left aspect of the thecal sac. No significant neural foraminal narrowing. C6-C7: Mild degenerative disc disease with posterior disc osteophyte complex and no significant compression on the ventral thecal sac. Upper chest: Unremarkable Other: None IMPRESSION: No CT evidence of acute fracture malalignment of the cervical spine. Pronounced posterior disc osteophyte complex with central posterior disc protrusion at the C2-C3 level (level of cord injury on prior MR) contacts the ventral thecal sac/cord, contributing to acquired stenosis at this level. Posterior disc osteophyte complex at additional levels of C3-C4, C4-C5, C5-C6 with associated central/ paracentral disc protrusion, as above, also resulting in acquired stenosis. Mild edema in the retropharyngeal region corresponds to findings on prior MR, favored to represent soft tissue injury in the absence of fracture. Carotid vascular disease. Electronically Signed   By: Corrie Mckusick D.O.   On: 08/13/2017 12:22    Cardiac Studies   TTE: 08/13/2017 - Left ventricle: The cavity size was normal. Wall thickness was at   the upper limits of normal. Systolic function was normal. The   estimated ejection fraction was in the range of 55% to 60%. Wall   motion was normal; there were no regional wall motion   abnormalities. Left ventricular diastolic function parameters   were normal. - Aortic valve: There was trivial  regurgitation. - Pulmonary arteries: PA peak pressure: 38 mm Hg (S).  Patient Profile     71 y.o. 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 No indication for pacer  Normal LVEF, no regional wall motion abnormalities. Neuro:  Has been seen by neurosurgery decadron given once but apparently no further Notes indicate "not a candidate for early surgery " for traumatic chord syndrome but UE;s Still flacid Plan per neuro and neurosurgery. Hypotension : resolved with hydration levophed off doubt contribution to neuro syndrome, now mildly hypertensive. Levophed held since  yesterday. Start physical therapy.  The patient has no cardiac etiology of his problems, we will transfer to medicine.   For questions or updates, please contact [place] Please consult [email or web address] for contact info under Cardiology/STEMI.      Signed, Ena Dawley, MD  08/15/2017, 9:16 AM

## 2017-08-15 NOTE — Progress Notes (Signed)
OT Cancellation Note  Patient Details Name: KEIANDRE CYGAN MRN: [id number] DOB: 1946-05-17   Cancelled Treatment:    Reason Eval/Treat Not Completed: Patient not medically ready. Pt with active bedrest orders. Discussed with RN and awaiting clearance from MD today prior to initiating activity. Will check back as appropriate.   Norman Herrlich, MS OTR/L  Pager: [phone]   Norman Herrlich 08/15/2017, 7:58 AM

## 2017-08-15 NOTE — Progress Notes (Signed)
PT Cancellation Note  Patient Details Name: Dean Oliver MRN: [id number] DOB: 10-15-1945   Cancelled Treatment:    Reason Eval/Treat Not Completed: Patient not medically ready. Pt with active bedrest orders. Per RN, waiting until neuro rounds this morning before taking bedrest orders off. Will follow-up for PT evaluation when medically appropriate.  [name], PT, DPT Acute Rehab Services  Pager: [place] 08/15/2017, 7:58 AM

## 2017-08-15 NOTE — Evaluation (Signed)
Occupational Therapy Evaluation Patient Details Name: Dean Oliver MRN: [id number] DOB: 24-Sep-1945 Today's Date: 08/15/2017    History of Present Illness Pt is a 71 y.o. male admitted on 08/12/17 with c/o increased weakness after a fall; pt resides at a group home with baseline cognitive impairments and is a difficult historian. Cervical CT showed no acute fx; MRI showed acute C2-3 spinal cord contusion. Neurology suspects central cord syndrome; neurosurgery determined pt is not a candidate for early surgical intervention. Pertinent PMH includes HTN, bilateral cataracts, schizophrenia.    Clinical Impression   PTA, pt was primarily independent with basic ADL and functional mobility. He lives in a group home and reports that he has assistance with his meals and supervision for tub transfers at times. Pt currently requires overall total assistance for ADL participation. Noted trace tricep strength and dull light touch sensation in B UE this session. He was able to tolerate activity seated at EOB with up to max assist for balance and stable vital signs. Pt eager and willing to participate. Feel pt would benefit from spinal cord injury specialized intensive rehabilitation post-acute D/C.     Follow Up Recommendations  CIR(Spinal cord injury rehabilitation)    Equipment Recommendations  Other (comment)(TBD at next venue)    Recommendations for Other Services       Precautions / Restrictions Precautions Precautions: Fall;Cervical Required Braces or Orthoses: Cervical Brace Cervical Brace: Hard collar;Other (comment)(OK to apply in sitting) Restrictions Weight Bearing Restrictions: No      Mobility Bed Mobility Overal bed mobility: Needs Assistance Bed Mobility: Rolling;Supine to Sit;Sit to Supine Rolling: +2 for physical assistance;Max assist   Supine to sit: Total assist;+2 for physical assistance Sit to supine: Total assist;+2 for physical assistance   General bed mobility  comments: MaxA to roll as pt able to assist with some hip flexion; totalA for sit<>supine  Transfers Overall transfer level: Needs assistance Equipment used: None Transfers: Sit to/from Stand Sit to Stand: Total assist;+2 physical assistance         General transfer comment: Unable to achieve fully upright standing with totalA (+2); pt able to initiate some hip extension (grossly 2/5)    Balance Overall balance assessment: Needs assistance Sitting-balance support: No upper extremity supported;Feet supported Sitting balance-Leahy Scale: Fair Sitting balance - Comments: Pt able to maintain balance for ~10sec at a time with cues for anterior/lateral lean; other times requiring maxA for balance (easily fatigued) Postural control: Posterior lean;Left lateral lean Standing balance support: Bilateral upper extremity supported Standing balance-Leahy Scale: Zero                             ADL either performed or assessed with clinical judgement   ADL Overall ADL's : Needs assistance/impaired                                       General ADL Comments: Total assistance at this time.      Vision Patient Visual Report: No change from baseline Vision Assessment?: Yes Eye Alignment: Impaired (comment)(B eyes laterally rotated) Ocular Range of Motion: Within Functional Limits Alignment/Gaze Preference: Head turned(to the R) Tracking/Visual Pursuits: Decreased smoothness of vertical tracking;Decreased smoothness of horizontal tracking Additional Comments: Difficulty tracking likely due to cognitive deficits.      Perception     Praxis      Pertinent Vitals/Pain Pain Assessment:  Faces Faces Pain Scale: Hurts little more Pain Location: posterior neck, shoulders (R>L) Pain Descriptors / Indicators: Discomfort;Sore Pain Intervention(s): Monitored during session;Repositioned     Hand Dominance     Extremity/Trunk Assessment Upper Extremity  Assessment Upper Extremity Assessment: RUE deficits/detail;LUE deficits/detail RUE Deficits / Details: Significant trap tightness with limited GH and scapulothoracic movement with passive shoulder AROM in flexion and abduction (abduction more limited than flexion). Trace triceps activation and otherwise 0/5 strength. Sensation dull but pt able to discriminate between locations.  LUE Deficits / Details: Trace triceps strength but otherwise 0/5 strength. Dull light touch sensation but able to discriminate location being touched.    Lower Extremity Assessment Lower Extremity Assessment: Defer to PT evaluation RLE Deficits / Details: R hip flex grossly 2/5, knee ext 3/5, knee flex 3/5, ankle DF/PF 0/5; able to localize light touch sensation, but reports it feels numb RLE Sensation: decreased light touch RLE Coordination: decreased fine motor;decreased gross motor LLE Deficits / Details: L hip flex grossly 2/5, knee ext 3/5, knee flex 3/5, ankle DF/PF 0/5; able to localize light touch sensation, but reports it feels numb LLE Sensation: decreased light touch LLE Coordination: decreased fine motor;decreased gross motor   Cervical / Trunk Assessment Cervical / Trunk Assessment: Kyphotic   Communication Communication Communication: Expressive difficulties   Cognition Arousal/Alertness: Awake/alert Behavior During Therapy: WFL for tasks assessed/performed Overall Cognitive Status: History of cognitive impairments - at baseline                                 General Comments: Appropriately interactive with PT/OT. Able to answer questions regarding PLOF, but unsure of accuracy. Able to follow one step commands with intermittent cues. Increased difficulty with sensation testing, simply stating "feels good" when asked about BUE/LE sensation and asked to elaborate. Poor ability to follow commands with visual tracking and initially difficulty reporting location of therapist's touch mixing up  upper and lower extremities but corrected with minimal cues.    General Comments  BP: rest 126/67, sitting 136/76, sitting ~5 min 143/77, return to supine 143/79. Pt c/o slight dizziness with position changes    Exercises     Shoulder Instructions      Home Living Family/patient expects to be discharged to:: Group home                                 Additional Comments: Pt with decreased ability to report; reports from group home, does not work      Prior Functioning/Environment Level of Independence: Needs assistance  Gait / Transfers Assistance Needed: Independent with ambulation ADL's / Homemaking Assistance Needed: Independent for bathing and dressing; sounds like group home provides meals            OT Problem List: Decreased strength;Decreased activity tolerance;Impaired balance (sitting and/or standing);Decreased safety awareness;Decreased knowledge of use of DME or AE;Decreased knowledge of precautions;Decreased range of motion;Decreased cognition;Impaired UE functional use      OT Treatment/Interventions: Self-care/ADL training;Therapeutic exercise;Energy conservation;DME and/or AE instruction;Therapeutic activities;Cognitive remediation/compensation;Patient/family education;Balance training;Visual/perceptual remediation/compensation    OT Goals(Current goals can be found in the care plan section) Acute Rehab OT Goals Patient Stated Goal: None stated OT Goal Formulation: With patient Time For Goal Achievement: 08/29/17 Potential to Achieve Goals: Good ADL Goals Pt/caregiver will Perform Home Exercise Program: Increased strength;Increased ROM;With Supervision;With written HEP provided;Both right and left upper  extremity(PROM/AAROM) Additional ADL Goal #1: Pt will complete bed mobility with overall max assist in preparation for ADL seated at EOB. Additional ADL Goal #2: Pt will successfully utilize tricep activation to press soft touch call button under  elbow to notify nursing of his needs. Additional ADL Goal #3: Pt will sit at EOB with overall mod assist for ADL participation.  OT Frequency: Min 3X/week   Barriers to D/C:            Co-evaluation              AM-PAC PT "6 Clicks" Daily Activity     Outcome Measure Help from another person eating meals?: Total Help from another person taking care of personal grooming?: Total Help from another person toileting, which includes using toliet, bedpan, or urinal?: Total Help from another person bathing (including washing, rinsing, drying)?: Total Help from another person to put on and taking off regular upper body clothing?: Total Help from another person to put on and taking off regular lower body clothing?: Total 6 Click Score: 6   End of Session Equipment Utilized During Treatment: Gait belt;Rolling walker Nurse Communication: Mobility status  Activity Tolerance: Patient tolerated treatment well Patient left: in bed;with call bell/phone within reach  OT Visit Diagnosis: Muscle weakness (generalized) (M62.81);Other abnormalities of gait and mobility (R26.89);Other symptoms and signs involving cognitive function                Time: 4166-0630 OT Time Calculation (min): 40 min Charges:  OT General Charges $OT Visit: 1 Visit OT Evaluation $OT Eval Moderate Complexity: 1 Mod G-Codes:     Norman Herrlich, MS OTR/L  Pager: [place] A Rodrecus Belsky 08/15/2017, 5:29 PM

## 2017-08-15 NOTE — Care Management Note (Addendum)
Case Management Note  Patient Details  Name: Dean Oliver MRN: [id number] Date of Birth: 1945/10/14  Subjective/Objective:  From [place] [phone].  Dean Oliver phone is [phone]- she is the Scientist, physiological for the [employer].  Dean Oliver is his legal [place], [phone]. Patient has given NCM permission to speak with [facility].  NCM called and could not leave message, vm full.    Patient presents with central cord syndrome, he has bradycardia, no indication for pacer, per Neurosurgery not a candidate for early surgery- he does not have ongoing cord compreession.  His UE is flaccid.  Per pt eval rec CIR.    11/27 [place], BSN - NCM spoke with [name], informed her that pt eval rec CIR and that will be the goal for patient.  Informed her we will keep her updated on progress.                  Action/Plan: NCM will follow for dc needs.   Expected Discharge Date:                  Expected Discharge Plan:  Group Home  In-House Referral:  Clinical Social Work  Discharge planning Services  CM Consult  Post Acute Care Choice:    Choice offered to:     DME Arranged:    DME Agency:     HH Arranged:    HH Agency:     Status of Service:  In process, will continue to follow  If discussed at Long Length of Stay Meetings, dates discussed:    Additional Comments:  Zenon Mayo, RN 08/15/2017, 4:29 PM

## 2017-08-16 DIAGNOSIS — S14129A Central cord syndrome at unspecified level of cervical spinal cord, initial encounter: Secondary | ICD-10-CM

## 2017-08-16 DIAGNOSIS — G8250 Quadriplegia, unspecified: Secondary | ICD-10-CM | POA: Diagnosis not present

## 2017-08-16 DIAGNOSIS — R001 Bradycardia, unspecified: Secondary | ICD-10-CM

## 2017-08-16 DIAGNOSIS — D62 Acute posthemorrhagic anemia: Secondary | ICD-10-CM

## 2017-08-16 DIAGNOSIS — E871 Hypo-osmolality and hyponatremia: Secondary | ICD-10-CM | POA: Diagnosis not present

## 2017-08-16 DIAGNOSIS — I10 Essential (primary) hypertension: Secondary | ICD-10-CM

## 2017-08-16 DIAGNOSIS — I959 Hypotension, unspecified: Secondary | ICD-10-CM

## 2017-08-16 DIAGNOSIS — W19XXXA Unspecified fall, initial encounter: Secondary | ICD-10-CM

## 2017-08-16 DIAGNOSIS — F203 Undifferentiated schizophrenia: Secondary | ICD-10-CM | POA: Diagnosis not present

## 2017-08-16 DIAGNOSIS — S14122A Central cord syndrome at C2 level of cervical spinal cord, initial encounter: Secondary | ICD-10-CM | POA: Diagnosis not present

## 2017-08-16 DIAGNOSIS — M5000 Cervical disc disorder with myelopathy, unspecified cervical region: Secondary | ICD-10-CM

## 2017-08-16 DIAGNOSIS — W19XXXD Unspecified fall, subsequent encounter: Secondary | ICD-10-CM | POA: Diagnosis not present

## 2017-08-16 DIAGNOSIS — N319 Neuromuscular dysfunction of bladder, unspecified: Secondary | ICD-10-CM

## 2017-08-16 DIAGNOSIS — F209 Schizophrenia, unspecified: Secondary | ICD-10-CM

## 2017-08-16 DIAGNOSIS — S14109A Unspecified injury at unspecified level of cervical spinal cord, initial encounter: Secondary | ICD-10-CM | POA: Diagnosis not present

## 2017-08-16 LAB — BASIC METABOLIC PANEL
ANION GAP: 5 (ref 5–15)
BUN: 8 mg/dL (ref 6–20)
CALCIUM: 8.1 mg/dL — AB (ref 8.9–10.3)
CO2: 23 mmol/L (ref 22–32)
Chloride: 105 mmol/L (ref 101–111)
Creatinine, Ser: 0.6 mg/dL — ABNORMAL LOW (ref 0.61–1.24)
GFR calc Af Amer: >60 mL/min (ref >60)
Glucose, Bld: 108 mg/dL — ABNORMAL HIGH (ref 65–99)
POTASSIUM: 4.4 mmol/L (ref 3.5–5.1)
SODIUM: 133 mmol/L — AB (ref 135–145)

## 2017-08-16 LAB — MAGNESIUM: MAGNESIUM: 1.6 mg/dL — AB (ref 1.7–2.4)

## 2017-08-16 MED ORDER — HYDROCODONE-ACETAMINOPHEN 5-325 MG PO TABS
1.0000 | ORAL_TABLET | Freq: Four times a day (QID) | ORAL | Status: DC | PRN
  Administered 2017-08-16 – 2017-08-17 (×4): 2 via ORAL
  Filled 2017-08-16 (×4): qty 2

## 2017-08-16 MED ORDER — TIZANIDINE HCL 4 MG PO TABS
4.0000 mg | ORAL_TABLET | Freq: Four times a day (QID) | ORAL | Status: DC | PRN
  Administered 2017-08-16: 4 mg via ORAL
  Filled 2017-08-16 (×3): qty 1

## 2017-08-16 MED FILL — Medication: Qty: 1 | Status: AC

## 2017-08-16 NOTE — Progress Notes (Signed)
Patient ID: Dean Oliver, male   DOB: 1945-12-18, 71 y.o.   MRN: [id number] Subjective: Patient reports pain in neck and arms  Objective: Vital signs in last 24 hours: Temp:  [97.7 F (36.5 C)-99.6 F (37.6 C)] 98.6 F (37 C) (11/27 0744) Pulse Rate:  [42-65] 49 (11/27 0744) Resp:  [10-21] 14 (11/27 0744) BP: (121-165)/(59-84) 157/70 (11/27 0744) SpO2:  [97 %-100 %] 98 % (11/27 0744)  Intake/Output from previous day: 11/26 0701 - 11/27 0700 In: 3140 [P.O.:740; I.V.:2400] Out: 2450 [Urine:2450] Intake/Output this shift: No intake/output data recorded.  Awake alert, no change neuro, LEs are anti gravity in bed, arms are plegic, he reports gross sensation in arms  Lab Results: Lab Results  Component Value Date   WBC 6.4 08/13/2017   HGB 10.7 (L) 08/13/2017   HCT 32.4 (L) 08/13/2017   MCV 85.5 08/13/2017   PLT 205 08/13/2017   Lab Results  Component Value Date   INR 1.27 08/13/2017   BMET Lab Results  Component Value Date   NA 136 08/14/2017   K 3.8 08/14/2017   CL 112 (H) 08/14/2017   CO2 20 (L) 08/14/2017   GLUCOSE 103 (H) 08/14/2017   BUN 12 08/14/2017   CREATININE 0.71 08/14/2017   CALCIUM 8.3 (L) 08/14/2017    Studies/Results: No results found.  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 CIR consulted.  I saw mention of steroids in the referring provider's note - solumedrol protocol is no longer the standard of care in SCI as the risks were proven to outweigh the benefits pts were receiving. In this particular case, since there was no mention of arm weakness (other than L shoulder pain with reduced mvmt felt secondary to the fall I assume) in any of the initial providers' notes and it would be almost impossible to imagine he fell and injured his cord and then became acutely weak several hrs later, it is difficult to be sure this is a  traumatic injury and there was consideration of spinal cord infarct as the etiology. Such a delayed presentation of acute spinal cord injury would not be considered typical, yet his clinical syndrome is certainly most c/w a traumatic central cord syndrome and not with cord infarct, and he has edema in the soft tissues. This is really only important in clinical decision making regarding surgical intervention and its timing.  Timing of surgery for decompression after central cord injury has long been a matter of debate among neurosurgeons, with older literature favoring delayed surgery (>30 days to allow cord edema to resolve prior to surgical manipulation) and newer literature suggesting early surgery (<48 hrs if cord is compressed) is safe. Again, here we have no on going cord compression on imaging and therefore I felt early surgery carried more risk than benefit since it would not relieve the cord of on going pressure and would serve to only stabilize the spine to reduce the risk of re-injury in the event he fell again at some point.  Hope all that helps. CIR consult entered. Pain meds and muscle relaxants ordered.   LOS: 4 days    Onesimo Lingard S 08/16/2017, 8:06 AM

## 2017-08-16 NOTE — Consult Note (Signed)
Physical Medicine and Rehabilitation Consult Reason for Consult: Decreased functional mobility Referring Physician: [facility]   HPI: Dean Oliver is a 71 y.o. right handed male with history of hypertension, schizophrenia. Per chart review patient resides in a group home with baseline cognitive impairments. Presented 08/12/2017 after a fall with complaints of weakness in the arms. CT of the head showed artifact versus subcentimeter infarct in the left pons. MRI cervical spine showed spinal cord edema at C2-3 as well as moderate spinal stenosis/central cord syndrome. Limited MRI brain reviewed, relatively unremarkable. Neurosurgery consulted not a surgical candidate. Maintained in an Aspen cervical collar when out of bed. Bouts of hypotension as well as bradycardia required Levophed as well as IV hydration. Cardiology service is consulted. Echocardiogram with ejection fraction of 60% no wall motion abnormalities. Bradycardia felt to be chronic no AV block noted. Cardiology services signed off 08/15/2017 no further recommendations. Subcutaneous heparin for DVT prophylaxis. Currently on dysphagia #2 thin liquid diet. Physical therapy evaluation completed with recommendations of physical medicine rehabilitation consult.  Review of Systems  Constitutional: Negative for chills and fever.  HENT: Negative for hearing loss.   Eyes: Negative for blurred vision and double vision.  Respiratory: Negative for cough and shortness of breath.   Cardiovascular: Positive for leg swelling. Negative for chest pain and palpitations.  Genitourinary: Positive for urgency. Negative for dysuria and hematuria.  Musculoskeletal: Positive for myalgias.  Skin: Negative for rash.  Neurological: Positive for sensory change and focal weakness. Negative for seizures.  Psychiatric/Behavioral:       Schizophrenia  All other systems reviewed and are negative.  Past Medical History:  Diagnosis Date  . Cataracts, bilateral     . HTN (hypertension)   . Schizophrenia [facility])    Past Surgical History:  Procedure Laterality Date  . None     No pertinent family history of trauma. Social History:  Denies tobacco, alcohol, and illicit drug history. Allergies: No Known Allergies Medications Prior to Admission  Medication Sig Dispense Refill  . acetaminophen (TYLENOL) 325 MG tablet Take 650 mg by mouth every 6 (six) hours as needed for headache (pain).    Marland Kitchen acetaZOLAMIDE (DIAMOX) 250 MG tablet Take 500 mg by mouth daily.    . benztropine (COGENTIN) 1 MG tablet Take 1 mg by mouth daily.    Marland Kitchen docusate sodium (COLACE) 100 MG capsule Take 200 mg by mouth at bedtime.    . dorzolamide-timolol (COSOPT) 22.3-6.8 MG/ML ophthalmic solution Place 1 drop into both eyes daily.    Marland Kitchen doxazosin (CARDURA) 2 MG tablet Take 1 mg by mouth daily.    Marland Kitchen latanoprost (XALATAN) 0.005 % ophthalmic solution Place 1 drop into both eyes at bedtime.    Marland Kitchen lisinopril-hydrochlorothiazide (PRINZIDE,ZESTORETIC) 20-12.5 MG tablet Take 1 tablet by mouth daily.    . Multiple Vitamins-Minerals (CERTAVITE SENIOR/ANTIOXIDANT) TABS Take 1 tablet by mouth daily.    . risperidone (RISPERDAL) 4 MG tablet Take 4 mg by mouth 2 (two) times daily.    . Tafluprost (ZIOPTAN) 0.0015 % SOLN Place 1 drop into both eyes daily.      Home: Home Living Family/patient expects to be discharged to:: Group home Additional Comments: Pt with decreased ability to report; reports from group home, does not work  Functional History: Prior Function Level of Independence: Needs assistance Gait / Transfers Assistance Needed: Independent with ambulation ADL's / Homemaking Assistance Needed: Independent for bathing and dressing; sounds like group home provides meals Functional Status:  Mobility: Bed  Mobility Overal bed mobility: Needs Assistance Bed Mobility: Rolling, Supine to Sit, Sit to Supine Rolling: +2 for physical assistance, Max assist Supine to sit: Total assist, +2 for  physical assistance Sit to supine: Total assist, +2 for physical assistance General bed mobility comments: MaxA to roll as pt able to assist with some hip flexion; totalA for sit<>supine Transfers Overall transfer level: Needs assistance Equipment used: None Transfers: Sit to/from Stand Sit to Stand: Total assist, +2 physical assistance General transfer comment: Unable to achieve fully upright standing with totalA (+2); pt able to initiate some hip extension (grossly 2/5)      ADL: ADL Overall ADL's : Needs assistance/impaired General ADL Comments: Total assistance at this time.   Cognition: Cognition Overall Cognitive Status: History of cognitive impairments - at baseline Orientation Level: Oriented X4 Cognition Arousal/Alertness: Awake/alert Behavior During Therapy: WFL for tasks assessed/performed Overall Cognitive Status: History of cognitive impairments - at baseline General Comments: Appropriately interactive with PT/OT. Able to answer questions regarding PLOF, but unsure of accuracy. Able to follow one step commands with intermittent cues. Increased difficulty with sensation testing, simply stating "feels good" when asked about BUE/LE sensation and asked to elaborate. Poor ability to follow commands with visual tracking and initially difficulty reporting location of therapist's touch mixing up upper and lower extremities but corrected with minimal cues.   Blood pressure (!) 145/72, pulse (!) 52, temperature 98.6 F (37 C), temperature source Oral, resp. rate 14, SpO2 98 %. Physical Exam  Vitals reviewed. Constitutional: He appears well-developed.  Frail  HENT:  Head: Normocephalic and atraumatic.  Eyes: Right eye exhibits no discharge. Left eye exhibits no discharge.  Right gaze preference with limited movement to left  Cardiovascular: Normal rate and regular rhythm.  Respiratory: Effort normal and breath sounds normal. No respiratory distress.  GI: Soft. Bowel sounds  are normal. He exhibits no distension.  Musculoskeletal: He exhibits no edema or tenderness.  Neurological: He is alert.  Patient provides his name and age.  Limited medical historian A&Ox3 Right lean Motor: B/l UE 0/5 proximal to distal B/l LE: HF 2/5, distally 0/5   Skin: Skin is warm and dry.  Psychiatric: His speech is slurred. He is slowed.    No results found for this or any previous visit (from the past 24 hour(s)). No results found.  Assessment/Plan: Diagnosis: Cervical myelopathy with disc herniation Labs and images independently reviewed.  Records reviewed and summated above.  1. Does the need for close, 24 hr/day medical supervision in concert with the patient's rehab needs make it unreasonable for this patient to be served in a less intensive setting? Yes  2. Co-Morbidities requiring supervision/potential complications: HTN (monitor and provide prns in accordance with increased physical exertion and pain), schizophrenia (monitor mood), hypotension (monitor with increased mobility), bradycardia (see previous), dysphagia (advance diet as tolerated), hyponatremia (cont to monitor, treat if necessary), ABLA (transfuse if necessary to ensure appropriate perfusion for increased activity tolerance) 3. Due to bladder management, bowel management, safety, skin/wound care, disease management, medication administration, pain management and patient education, does the patient require 24 hr/day rehab nursing? Yes 4. Does the patient require coordinated care of a physician, rehab nurse, PT (1-2 hrs/day, 5 days/week), OT (1-2 hrs/day, 5 days/week) and SLP (1-2 hrs/day, 5 days/week) to address physical and functional deficits in the context of the above medical diagnosis(es)? Yes Addressing deficits in the following areas: balance, endurance, locomotion, strength, transferring, bowel/bladder control, bathing, dressing, feeding, grooming, toileting, cognition, speech, language, swallowing and  psychosocial support 5. Can the patient actively participate in an intensive therapy program of at least 3 hrs of therapy per day at least 5 days per week? Potentially 6. The potential for patient to make measurable gains while on inpatient rehab is excellent and good 7. Anticipated functional outcomes upon discharge from inpatient rehab are mod assist and max assist  with PT, mod assist and max assist with OT, min assist and mod assist with SLP. 8. Estimated rehab length of stay to reach the above functional goals is: 30-35 days. 9. Anticipated D/C setting: SNF 10. Anticipated post D/C treatments: SNF 11. Overall Rehab/Functional Prognosis: good and fair  RECOMMENDATIONS: This patient's condition is appropriate for continued rehabilitative care in the following setting: CIR to decrease burden of care. Patient has agreed to participate in recommended program. Yes Note that insurance prior authorization may be required for reimbursement for recommended care.  Comment: Rehab Admissions Coordinator to follow up.  Delice Lesch, MD, ABPMR Lavon Paganini Angiulli, PA-C 08/16/2017

## 2017-08-16 NOTE — Progress Notes (Signed)
Report called to [facility]. Patient being transferred to 4E 10. Family present and aware of transfer.

## 2017-08-16 NOTE — Progress Notes (Signed)
Inpatient Rehabilitation  Please see consult by Dr. Posey Pronto for full details.  I have called patient's legal guardian to discuss post acute rehab options and await a return call.  Plan to update the team as I know.  Call if questions.   Carmelia Roller., CCC/SLP Admission Coordinator  [place]  Cell [phone]

## 2017-08-16 NOTE — Progress Notes (Addendum)
PROGRESS NOTE    Dean Oliver  NTI:[id number] DOB: [birth year] DOA: 08/12/2017 PCP: Patient, No Pcp Per   Brief Narrative:  71 y.o. BM  PMHx Schizophrenia, HTN, cataracts, who is being seen today for the evaluation of bradycardia at the request of Dr .Gilford Raid.  Over the past couple days he says he has been dizzy  Passed out  Details not clear  Patient is a difficult historian  He deneis CP  No N/V  Occasional loose stool  Slightly feverish at times  Family member is here  She says that pt got picked up yesterday at about 10 am for Thanksgiving dinner  Stayed until about 10 pm  One family member says he wasn't himself      Subjective: 11/27 A/O 4, negative CP, negative SOB, negative abdominal pain, negative N/V.   Assessment & Plan:   Active Problems:   Symptomatic bradycardia  Central cord syndrome -acute onset of weakness after a fall on Friday. - MRI reveals acute C2-3 spinal cord contusion -Received one dose steroids however per EMR neurosurgery felt risk of continued steroid treatment outweighed benefits.  -Per neurosurgery He will need therapy and rehabilitation/placement.    I do not recommend early surgical intervention as he does not have ongoing cord compression and therefore early surgery likely will not change the neurologic outcome, and I would like to see him back in the office 2 weeks after discharge from the hospital. I will probably repeat the MRI of the cervical spine prior to considering any type of surgical intervention, and we would have to consider the risks and benefits of the surgery at that time based on the imaging and his prognosis for functional recovery. -Awaiting recommendations from CIR  Neurogenic bladder -Continue Foley  Bradycardia:  -seems like this may be chronic no AV block, HR lowest in 50'. No indication for pacer  Normal LVEF, no regional wall motion abnormalities. Hypotension  -Resolved off levophed   Undifferentiated  Schizophrenia      DVT prophylaxis: Subcutaneous heparin Code Status: Full Family Communication: None Disposition Plan: CIR?   Consultants:  Neurosurgery Neurology Cardiology    Procedures/Significant Events:     I have personally reviewed and interpreted all radiology studies and my findings are as above.  VENTILATOR SETTINGS:    Cultures   Antimicrobials:    Devices    LINES / TUBES:      Continuous Infusions: . sodium chloride 100 mL/hr at 08/16/17 0216  . norepinephrine (LEVOPHED) Adult infusion Stopped (08/13/17 1200)     Objective: Vitals:   08/16/17 0000 08/16/17 0100 08/16/17 0200 08/16/17 0300  BP: (!) 154/79 (!) 161/76 (!) 147/73 (!) 165/78  Pulse: (!) 48 (!) 51 (!) 51 (!) 45  Resp: 10 14 14 10   Temp:    99.2 F (37.3 C)  TempSrc:    Oral  SpO2: 98% 98% 99% 99%    Intake/Output Summary (Last 24 hours) at 08/16/2017 0724 Last data filed at 08/16/2017 0300 Gross per 24 hour  Intake 2740 ml  Output 1725 ml  Net 1015 ml   There were no vitals filed for this visit.  Examination:  General:  A/O 4, No acute respiratory distress Neck:  Negative scars, masses, positive torticollis, positive pain to palpation cervical area, negative JVD Lungs: Clear to auscultation bilaterally without wheezes or crackles Cardiovascular: Regular rate and rhythm without murmur gallop or rub normal S1 and S2 Abdomen: negative abdominal pain, nondistended, positive soft, bowel sounds, no rebound, no ascites,  no appreciable mass Extremities: No significant cyanosis, clubbing, or edema bilateral lower extremities Skin: Negative rashes, lesions, ulcers Psychiatric:  Negative depression, negative anxiety, negative fatigue, negative mania  Central nervous system:  Cranial nerves II through XII intact, tongue/uvula midline, bilateral upper extremity strength 0/5 bilateral lower extremity strength 2/5. Bilateral lower extremity unable to dorsiflex or plantarflex.  sensation intact throughout, negative dysarthria, negative expressive aphasia, negative receptive aphasia.  .     Data Reviewed: Care during the described time interval was provided by me .  I have reviewed this patient's available data, including medical history, events of note, physical examination, and all test results as part of my evaluation.   CBC: Recent Labs  Lab 08/12/17 1547 08/12/17 1621 08/12/17 1920 08/13/17 0824  WBC 4.1  --  7.1 6.4  NEUTROABS 2.3  --   --   --   HGB 10.8* 9.9* 10.6* 10.7*  HCT 35.0* 29.0* 33.1* 32.4*  MCV 90.0  --  86.2 85.5  PLT 220  --  224 161   Basic Metabolic Panel: Recent Labs  Lab 08/12/17 1547 08/12/17 1621 08/12/17 1920 08/13/17 0824 08/14/17 0649  NA 137 139  --  137 136  K 3.3* 3.8  --  3.1* 3.8  CL 111 113*  --  113* 112*  CO2 21*  --   --  18* 20*  GLUCOSE 116* 125*  --  112* 103*  BUN 17 15  --  12 12  CREATININE 1.07 1.00 0.94 0.76 0.71  CALCIUM 8.9  --   --  8.1* 8.3*   GFR: CrCl cannot be calculated (Unknown ideal weight.). Liver Function Tests: Recent Labs  Lab 08/12/17 1547  AST 20  ALT 13*  ALKPHOS 73  BILITOT 0.6  PROT 6.7  ALBUMIN 3.8   No results for input(s): LIPASE, AMYLASE in the last 168 hours. No results for input(s): AMMONIA in the last 168 hours. Coagulation Profile: Recent Labs  Lab 08/13/17 0824  INR 1.27   Cardiac Enzymes: Recent Labs  Lab 08/12/17 1547 08/12/17 1920 08/13/17 0547 08/13/17 0824  TROPONINI <0.03 <0.03 <0.03 <0.03   BNP (last 3 results) No results for input(s): PROBNP in the last 8760 hours. HbA1C: No results for input(s): HGBA1C in the last 72 hours. CBG: Recent Labs  Lab 08/13/17 0612  GLUCAP 115*   Lipid Profile: No results for input(s): CHOL, HDL, LDLCALC, TRIG, CHOLHDL, LDLDIRECT in the last 72 hours. Thyroid Function Tests: No results for input(s): TSH, T4TOTAL, FREET4, T3FREE, THYROIDAB in the last 72 hours. Anemia Panel: No results for input(s):  VITAMINB12, FOLATE, FERRITIN, TIBC, IRON, RETICCTPCT in the last 72 hours. Urine analysis:    Component Value Date/Time   COLORURINE YELLOW 08/13/2017 0022   APPEARANCEUR CLEAR 08/13/2017 0022   LABSPEC 1.014 08/13/2017 0022   PHURINE 5.0 08/13/2017 0022   GLUCOSEU NEGATIVE 08/13/2017 0022   HGBUR NEGATIVE 08/13/2017 0022   BILIRUBINUR NEGATIVE 08/13/2017 0022   KETONESUR NEGATIVE 08/13/2017 0022   PROTEINUR NEGATIVE 08/13/2017 0022   NITRITE NEGATIVE 08/13/2017 0022   LEUKOCYTESUR NEGATIVE 08/13/2017 0022   Sepsis Labs: @LABRCNTIP (procalcitonin:4,lacticidven:4)  ) Recent Results (from the past 240 hour(s))  MRSA PCR Screening     Status: None   Collection Time: 08/12/17  6:31 PM  Result Value Ref Range Status   MRSA by PCR NEGATIVE NEGATIVE Final    Comment:        The GeneXpert MRSA Assay (FDA approved for NASAL specimens only), is one component of  a comprehensive MRSA colonization surveillance program. It is not intended to diagnose MRSA infection nor to guide or monitor treatment for MRSA infections.   Culture, blood (Routine X 2) w Reflex to ID Panel     Status: None (Preliminary result)   Collection Time: 08/12/17  6:59 PM  Result Value Ref Range Status   Specimen Description BLOOD BLOOD LEFT ARM  Final   Special Requests IN PEDIATRIC BOTTLE Blood Culture adequate volume  Final   Culture NO GROWTH 3 DAYS  Final   Report Status PENDING  Incomplete         Radiology Studies: No results found.      Scheduled Meds: . aspirin EC  81 mg Oral Daily  . heparin  5,000 Units Subcutaneous Q8H  . potassium chloride  40 mEq Oral BID   Continuous Infusions: . sodium chloride 100 mL/hr at 08/16/17 0216  . norepinephrine (LEVOPHED) Adult infusion Stopped (08/13/17 1200)     LOS: 4 days    Time spent: 40 minutes    Dean Oliver, Geraldo Docker, MD [facility] Pager [phone]   If 7PM-7AM, please contact night-coverage [email or web address] Password  [facility] 08/16/2017, 7:24 AM

## 2017-08-17 ENCOUNTER — Inpatient Hospital Stay (HOSPITAL_COMMUNITY)
Admission: RE | Admit: 2017-08-17 | Discharge: 2017-09-07 | DRG: 945 | Disposition: A | Discharge status: Skilled Nursing Facility | Payer: Medicare Other | Source: Intra-hospital | Attending: Physical Medicine & Rehabilitation | Admitting: Physical Medicine & Rehabilitation

## 2017-08-17 DIAGNOSIS — S14122D Central cord syndrome at C2 level of cervical spinal cord, subsequent encounter: Principal | ICD-10-CM

## 2017-08-17 DIAGNOSIS — I10 Essential (primary) hypertension: Secondary | ICD-10-CM | POA: Diagnosis present

## 2017-08-17 DIAGNOSIS — E871 Hypo-osmolality and hyponatremia: Secondary | ICD-10-CM | POA: Diagnosis present

## 2017-08-17 DIAGNOSIS — A499 Bacterial infection, unspecified: Secondary | ICD-10-CM | POA: Diagnosis not present

## 2017-08-17 DIAGNOSIS — B964 Proteus (mirabilis) (morganii) as the cause of diseases classified elsewhere: Secondary | ICD-10-CM | POA: Diagnosis present

## 2017-08-17 DIAGNOSIS — R001 Bradycardia, unspecified: Secondary | ICD-10-CM | POA: Diagnosis present

## 2017-08-17 DIAGNOSIS — B952 Enterococcus as the cause of diseases classified elsewhere: Secondary | ICD-10-CM | POA: Diagnosis present

## 2017-08-17 DIAGNOSIS — G8252 Quadriplegia, C1-C4 incomplete: Secondary | ICD-10-CM | POA: Diagnosis present

## 2017-08-17 DIAGNOSIS — F419 Anxiety disorder, unspecified: Secondary | ICD-10-CM | POA: Diagnosis present

## 2017-08-17 DIAGNOSIS — M5001 Cervical disc disorder with myelopathy,  high cervical region: Secondary | ICD-10-CM | POA: Diagnosis present

## 2017-08-17 DIAGNOSIS — D62 Acute posthemorrhagic anemia: Secondary | ICD-10-CM | POA: Diagnosis not present

## 2017-08-17 DIAGNOSIS — W19XXXD Unspecified fall, subsequent encounter: Secondary | ICD-10-CM | POA: Diagnosis present

## 2017-08-17 DIAGNOSIS — N390 Urinary tract infection, site not specified: Secondary | ICD-10-CM | POA: Diagnosis not present

## 2017-08-17 DIAGNOSIS — G8250 Quadriplegia, unspecified: Secondary | ICD-10-CM | POA: Diagnosis not present

## 2017-08-17 DIAGNOSIS — F209 Schizophrenia, unspecified: Secondary | ICD-10-CM | POA: Diagnosis present

## 2017-08-17 DIAGNOSIS — Y738 Miscellaneous gastroenterology and urology devices associated with adverse incidents, not elsewhere classified: Secondary | ICD-10-CM | POA: Diagnosis not present

## 2017-08-17 DIAGNOSIS — N319 Neuromuscular dysfunction of bladder, unspecified: Secondary | ICD-10-CM

## 2017-08-17 DIAGNOSIS — S14129D Central cord syndrome at unspecified level of cervical spinal cord, subsequent encounter: Secondary | ICD-10-CM | POA: Diagnosis not present

## 2017-08-17 DIAGNOSIS — K592 Neurogenic bowel, not elsewhere classified: Secondary | ICD-10-CM

## 2017-08-17 DIAGNOSIS — M4802 Spinal stenosis, cervical region: Secondary | ICD-10-CM | POA: Diagnosis present

## 2017-08-17 DIAGNOSIS — S14123D Central cord syndrome at C3 level of cervical spinal cord, subsequent encounter: Secondary | ICD-10-CM | POA: Diagnosis not present

## 2017-08-17 DIAGNOSIS — S14129A Central cord syndrome at unspecified level of cervical spinal cord, initial encounter: Secondary | ICD-10-CM | POA: Diagnosis not present

## 2017-08-17 DIAGNOSIS — R1310 Dysphagia, unspecified: Secondary | ICD-10-CM | POA: Diagnosis not present

## 2017-08-17 DIAGNOSIS — S142XXA Injury of nerve root of cervical spine, initial encounter: Secondary | ICD-10-CM | POA: Diagnosis not present

## 2017-08-17 DIAGNOSIS — S14129S Central cord syndrome at unspecified level of cervical spinal cord, sequela: Secondary | ICD-10-CM

## 2017-08-17 DIAGNOSIS — N472 Paraphimosis: Secondary | ICD-10-CM | POA: Diagnosis present

## 2017-08-17 DIAGNOSIS — Z7982 Long term (current) use of aspirin: Secondary | ICD-10-CM | POA: Diagnosis not present

## 2017-08-17 DIAGNOSIS — I959 Hypotension, unspecified: Secondary | ICD-10-CM | POA: Diagnosis not present

## 2017-08-17 DIAGNOSIS — M7989 Other specified soft tissue disorders: Secondary | ICD-10-CM | POA: Diagnosis not present

## 2017-08-17 DIAGNOSIS — M5000 Cervical disc disorder with myelopathy, unspecified cervical region: Secondary | ICD-10-CM | POA: Diagnosis not present

## 2017-08-17 DIAGNOSIS — S14122A Central cord syndrome at C2 level of cervical spinal cord, initial encounter: Secondary | ICD-10-CM | POA: Diagnosis not present

## 2017-08-17 DIAGNOSIS — F203 Undifferentiated schizophrenia: Secondary | ICD-10-CM | POA: Diagnosis not present

## 2017-08-17 DIAGNOSIS — T83091A Other mechanical complication of indwelling urethral catheter, initial encounter: Secondary | ICD-10-CM | POA: Diagnosis not present

## 2017-08-17 LAB — CBC
HCT: 31.5 % — ABNORMAL LOW (ref 39.0–52.0)
Hemoglobin: 10.3 g/dL — ABNORMAL LOW (ref 13.0–17.0)
MCH: 27.5 pg (ref 26.0–34.0)
MCHC: 32.7 g/dL (ref 30.0–36.0)
MCV: 84 fL (ref 78.0–100.0)
Platelets: 173 10*3/uL (ref 150–400)
RBC: 3.75 MIL/uL — ABNORMAL LOW (ref 4.22–5.81)
RDW: 12.5 % (ref 11.5–15.5)
WBC: 5.7 10*3/uL (ref 4.0–10.5)

## 2017-08-17 LAB — CREATININE, SERUM
CREATININE: 0.6 mg/dL — AB (ref 0.61–1.24)
GFR calc Af Amer: >60 mL/min (ref >60)

## 2017-08-17 LAB — CULTURE, BLOOD (ROUTINE X 2)
CULTURE: NO GROWTH
SPECIAL REQUESTS: ADEQUATE

## 2017-08-17 MED ORDER — HYDRALAZINE HCL 20 MG/ML IJ SOLN
10.0000 mg | Freq: Four times a day (QID) | INTRAMUSCULAR | Status: DC | PRN
  Administered 2017-08-17: 10 mg via INTRAVENOUS
  Filled 2017-08-17: qty 1

## 2017-08-17 MED ORDER — ONDANSETRON HCL 4 MG/2ML IJ SOLN: 4.0000 mg | Freq: Four times a day (QID) | INTRAMUSCULAR | Status: DC | PRN

## 2017-08-17 MED ORDER — POTASSIUM CHLORIDE CRYS ER 20 MEQ PO TBCR: 40.0000 meq | EXTENDED_RELEASE_TABLET | Freq: Two times a day (BID) | ORAL | 0 refills | Status: DC

## 2017-08-17 MED ORDER — TIZANIDINE HCL 4 MG PO TABS
4.0000 mg | ORAL_TABLET | Freq: Four times a day (QID) | ORAL | Status: DC | PRN
  Administered 2017-08-17 – 2017-08-19 (×2): 4 mg via ORAL
  Filled 2017-08-17 (×2): qty 1

## 2017-08-17 MED ORDER — NITROGLYCERIN 0.4 MG SL SUBL
0.4000 mg | SUBLINGUAL_TABLET | SUBLINGUAL | Status: DC | PRN
Start: 2017-08-17 — End: 2017-09-07

## 2017-08-17 MED ORDER — MUSCLE RUB 10-15 % EX CREA: 1.0000 "application " | TOPICAL_CREAM | CUTANEOUS | 0 refills | Status: DC | PRN

## 2017-08-17 MED ORDER — ONDANSETRON HCL 4 MG PO TABS: 4.0000 mg | ORAL_TABLET | Freq: Four times a day (QID) | ORAL | Status: DC | PRN

## 2017-08-17 MED ORDER — ZOLPIDEM TARTRATE 5 MG PO TABS: 5.0000 mg | ORAL_TABLET | Freq: Every evening | ORAL | 0 refills | Status: DC | PRN

## 2017-08-17 MED ORDER — ACETAMINOPHEN 325 MG PO TABS: 650.0000 mg | ORAL_TABLET | ORAL | 0 refills | Status: DC | PRN

## 2017-08-17 MED ORDER — ALPRAZOLAM 0.25 MG PO TABS: 0.2500 mg | ORAL_TABLET | Freq: Two times a day (BID) | ORAL | 0 refills | Status: DC | PRN

## 2017-08-17 MED ORDER — MUSCLE RUB 10-15 % EX CREA
TOPICAL_CREAM | CUTANEOUS | Status: DC | PRN
  Administered 2017-08-19 (×2): 1 via TOPICAL
  Administered 2017-08-19: 13:00:00 via TOPICAL
  Administered 2017-08-20: 1 via TOPICAL
  Administered 2017-08-21 – 2017-08-30 (×3): via TOPICAL
  Filled 2017-08-17 (×2): qty 85

## 2017-08-17 MED ORDER — HYDROCODONE-ACETAMINOPHEN 5-325 MG PO TABS
1.0000 | ORAL_TABLET | Freq: Four times a day (QID) | ORAL | Status: DC | PRN
  Administered 2017-08-17 – 2017-08-20 (×7): 1 via ORAL
  Filled 2017-08-17 (×7): qty 1

## 2017-08-17 MED ORDER — SORBITOL 70 % SOLN
30.0000 mL | Freq: Every day | Status: DC | PRN
  Administered 2017-08-20 – 2017-09-02 (×3): 30 mL via ORAL
  Filled 2017-08-17 (×3): qty 30

## 2017-08-17 MED ORDER — TIZANIDINE HCL 4 MG PO TABS: 4.0000 mg | ORAL_TABLET | Freq: Four times a day (QID) | ORAL | 0 refills | Status: DC | PRN

## 2017-08-17 MED ORDER — HEPARIN SODIUM (PORCINE) 5000 UNIT/ML IJ SOLN: 5000.0000 [IU] | Freq: Three times a day (TID) | INTRAMUSCULAR | Status: DC

## 2017-08-17 MED ORDER — HYDROCODONE-ACETAMINOPHEN 5-325 MG PO TABS: 1.0000 | ORAL_TABLET | Freq: Four times a day (QID) | ORAL | 0 refills | Status: DC | PRN

## 2017-08-17 MED ORDER — ASPIRIN 81 MG PO TBEC: 81.0000 mg | DELAYED_RELEASE_TABLET | Freq: Every day | ORAL | 0 refills | Status: DC

## 2017-08-17 MED ORDER — ASPIRIN EC 81 MG PO TBEC
81.0000 mg | DELAYED_RELEASE_TABLET | Freq: Every day | ORAL | Status: DC
  Administered 2017-08-18 – 2017-09-07 (×21): 81 mg via ORAL
  Filled 2017-08-17 (×21): qty 1

## 2017-08-17 MED ORDER — ALPRAZOLAM 0.25 MG PO TABS
0.2500 mg | ORAL_TABLET | Freq: Two times a day (BID) | ORAL | Status: DC | PRN
  Administered 2017-08-18 – 2017-08-27 (×3): 0.25 mg via ORAL
  Filled 2017-08-17 (×3): qty 1

## 2017-08-17 MED ORDER — HEPARIN SODIUM (PORCINE) 5000 UNIT/ML IJ SOLN
5000.0000 [IU] | Freq: Three times a day (TID) | INTRAMUSCULAR | Status: DC
  Administered 2017-08-17 – 2017-09-07 (×62): 5000 [IU] via SUBCUTANEOUS
  Filled 2017-08-17 (×63): qty 1

## 2017-08-17 MED ORDER — ACETAMINOPHEN 325 MG PO TABS
650.0000 mg | ORAL_TABLET | ORAL | Status: DC | PRN
  Administered 2017-08-22 – 2017-09-06 (×13): 650 mg via ORAL
  Filled 2017-08-17 (×14): qty 2

## 2017-08-17 NOTE — Progress Notes (Addendum)
Occupational Therapy Treatment Patient Details Name: Dean Oliver MRN: [id number] DOB: 1945/10/16 Today's Date: 08/17/2017    History of present illness Pt is a 71 y.o. male admitted on 08/12/17 with c/o increased weakness after a fall; pt resides at a group home with baseline cognitive impairments and is a difficult historian. Cervical CT showed no acute fx; MRI showed acute C2-3 spinal cord contusion. Neurology suspects central cord syndrome; neurosurgery determined pt is not a candidate for early surgical intervention. Pertinent PMH includes HTN, bilateral cataracts, schizophrenia.    OT comments  Pt demonstrating progress toward OT goals. He was able to tolerate participating in activities seated at EOB in preparation for ADL participation. Pt continues to demonstrate trace tricep activation of his R arm but unable to detect contraction on the L this session and noted compensatory upper trap hiking with movement. Pt demonstrating improved core stability and was able to sit with min guard assistance for up to 20-30 seconds at a time but required up to mod assist at times. Continue to recommend CIR level therapies post-acute D/C to maximize functional independence.     Follow Up Recommendations  CIR    Equipment Recommendations  Other (comment)(TBD at next venue)    Recommendations for Other Services      Precautions / Restrictions Precautions Precautions: Fall;Cervical Required Braces or Orthoses: Cervical Brace Cervical Brace: Hard collar;Other (comment)(OK to apply in sitting) Restrictions Weight Bearing Restrictions: No       Mobility Bed Mobility Overal bed mobility: Needs Assistance Bed Mobility: Rolling;Sidelying to Sit;Sit to Sidelying Rolling: +2 for physical assistance;Mod assist Sidelying to sit: +2 for physical assistance;Total assist     Sit to sidelying: Total assist;+2 for safety/equipment General bed mobility comments: Pt with improved core strength to  assist/initiate with bed mobility this date.   Transfers                 General transfer comment: Not attempted this session    Balance Overall balance assessment: Needs assistance Sitting-balance support: No upper extremity supported;Feet supported Sitting balance-Leahy Scale: Fair Sitting balance - Comments: Pt able to maintain balance for 20-30 seconds at a time with cues.                                    ADL either performed or assessed with clinical judgement   ADL Overall ADL's : Needs assistance/impaired                                       General ADL Comments: Total assistance at this time.      Vision   Additional Comments: Discussed with caregiver who reports pt with low vision (R worse than L) at baseline. Pt reports that he can see therapist's face but not details. Per caregiver, pt had cataract surgery but this was not successful.    Perception     Praxis      Cognition Arousal/Alertness: Awake/alert Behavior During Therapy: WFL for tasks assessed/performed Overall Cognitive Status: History of cognitive impairments - at baseline                                 General Comments: Interacting but focused on laying back down this session.  Exercises Exercises: Other exercises Other Exercises Other Exercises: Facilitated PROM in all planes for B UE.  Other Exercises: facilitated weight bearing through B elbows with lateral leans  Other Exercises: Facilitated improved core activation in preparation for ADL to return to midline positioning   Shoulder Instructions       General Comments VSS    Pertinent Vitals/ Pain       Pain Assessment: Faces Faces Pain Scale: Hurts little more Pain Location: posterior neck, shoulders (R>L) Pain Descriptors / Indicators: Discomfort;Sore Pain Intervention(s): Monitored during session;Repositioned  Home Living                                           Prior Functioning/Environment              Frequency  Min 3X/week        Progress Toward Goals  OT Goals(current goals can now be found in the care plan section)  Progress towards OT goals: Progressing toward goals  Acute Rehab OT Goals Patient Stated Goal: None stated OT Goal Formulation: With patient Time For Goal Achievement: 08/29/17 Potential to Achieve Goals: Good  Plan Discharge plan remains appropriate    Co-evaluation    PT/OT/SLP Co-Evaluation/Treatment: Yes Reason for Co-Treatment: For patient/therapist safety;Complexity of the patient's impairments (multi-system involvement)   OT goals addressed during session: ADL's and self-care;Strengthening/ROM      AM-PAC PT "6 Clicks" Daily Activity     Outcome Measure   Help from another person eating meals?: Total Help from another person taking care of personal grooming?: Total Help from another person toileting, which includes using toliet, bedpan, or urinal?: Total Help from another person bathing (including washing, rinsing, drying)?: Total Help from another person to put on and taking off regular upper body clothing?: Total Help from another person to put on and taking off regular lower body clothing?: Total 6 Click Score: 6    End of Session    OT Visit Diagnosis: Muscle weakness (generalized) (M62.81);Other abnormalities of gait and mobility (R26.89);Other symptoms and signs involving cognitive function   Activity Tolerance Patient tolerated treatment well   Patient Left in bed;with call bell/phone within reach(has soft touch but unsure if pt able to utilize at this time)   Nurse Communication Mobility status        Time: 5009-3818 OT Time Calculation (min): 30 min  Charges: OT General Charges $OT Visit: 1 Visit OT Treatments $Self Care/Home Management : 8-22 mins  Norman Herrlich, MS OTR/L  Pager: [place] 08/17/2017, 2:24 PM

## 2017-08-17 NOTE — Progress Notes (Signed)
Pt arrived to room 4W08; follows commands, oriented to hospital, "I fell."  VSS, brady at 56. Oriented to room and rehab process, will need reinforcement. C/O neck pain, 1 Norco effective.

## 2017-08-17 NOTE — Progress Notes (Signed)
Inpatient Rehabilitation  Received acute medical clearance and will proceed with admitting patient today.  Updated team; call if questions.   Carmelia Roller., CCC/SLP Admission Coordinator  [place]  Cell [phone]

## 2017-08-17 NOTE — Care Management Note (Signed)
Case Management Note Original Note Created Zenon Mayo, RN 08/15/2017, 4:29 PM  Patient Details  Name: Dean Oliver MRN: [id number] Date of Birth: 10-27-1945  Subjective/Objective:  From [place] [phone].  [name] phone is [phone]- she is the Scientist, physiological for the [place].  [name] is his legal [place], [phone]. Patient has given NCM permission to speak with [facility].  NCM called and could not leave message, vm full.    Patient presents with central cord syndrome, he has bradycardia, no indication for pacer, per Neurosurgery not a candidate for early surgery- he does not have ongoing cord compreession.  His UE is flaccid.  Per pt eval rec CIR.    11/27 [place], BSN - NCM spoke with [name], informed her that pt eval rec CIR and that will be the goal for patient.  Informed her we will keep her updated on progress.                  Action/Plan: NCM will follow for dc needs.   Expected Discharge Date:  08/17/17               Expected Discharge Plan:  [place]  In-House Referral:  Clinical Social Work  Discharge planning Services  CM Consult  Post Acute Care Choice:  NA Choice offered to:  NA  DME Arranged:    DME Agency:     HH Arranged:    [place] Agency:     Status of Service:  Completed, signed off  If discussed at [employer], dates discussed:    Discharge Disposition: IP rehab (CIR)  Additional Comments:  08/17/17- 1400- [name] RN, CM-- per Lenna Sciara with CIR- pt's guardian agreeable to CIR- and then if needed SNF after CIR - pt is stable for d/c today and has been cleared by MD to d/c to CIR later today- per Melissa bed is available and CIR will admit today.   Dahlia [place], RN 08/17/2017, 2:01 PM [phone]

## 2017-08-17 NOTE — Progress Notes (Signed)
Inpatient Rehabilitation  I met with patient and legal guardian, Pam to discuss team's recommendation for IP Rehab to reduce burden of care prior to discharge to the next level of care. They are in agreement plan and pending acute medical clearance will proceed with IP Rehab admission later today.  Discussed with nurse case manager.  Call if questions.   Carmelia Roller., CCC/SLP Admission Coordinator  [place]  Cell [phone]

## 2017-08-17 NOTE — H&P (Signed)
Physical Medicine and Rehabilitation Admission H&P    Chief Complaint  Patient presents with  . Bradycardia  . Hypotension  : HPI: Dean Oliver is a 71 y.o. right handed male with history of hypertension, schizophrenia. Per chart review patient resides in a group home with baseline cognitive impairments. Presented 08/12/2017 after a fall with complaints of weakness in the arms and legs. CT of the head showed artifact versus subcentimeter infarct in the left pons. MRI cervical spine showed spinal cord edema at C2-3 as well as moderate spinal stenosis/central cord syndrome. Limited MRI brain reviewed, relatively unremarkable. Neurosurgery consulted not a surgical candidate. Maintained in an Aspen cervical collar when out of bed. Bouts of hypotension as well as bradycardia required Levophed as well as IV hydration. Cardiology service was consulted. Troponin negative. Echocardiogram with ejection fraction of 60% no wall motion abnormalities. Bradycardia felt to be chronic no AV block noted. Cardiology services signed off 08/15/2017 no further recommendations. Subcutaneous heparin for DVT prophylaxis. Currently on dysphagia #2 thin liquid diet. Physical and occupational therapy evaluations completed with recommendations of physical medicine rehabilitation consult. Patient was admitted for a comprehensive rehabilitation program    Review of Systems  Constitutional: Negative for chills and fever.  HENT: Negative for hearing loss.   Eyes: Negative for blurred vision and double vision.  Respiratory: Negative for cough and shortness of breath.   Cardiovascular: Negative for chest pain and palpitations.  Gastrointestinal: Positive for constipation. Negative for nausea and vomiting.  Genitourinary: Positive for urgency.  Musculoskeletal: Positive for myalgias.  Skin: Negative for rash.  Neurological: Positive for sensory change and focal weakness. Negative for seizures.  Psychiatric/Behavioral:      Schizophrenia, cognitive impairments  All other systems reviewed and are negative.  Past Medical History:  Diagnosis Date  . Cataracts, bilateral   . HTN (hypertension)   . Schizophrenia [facility])    Past Surgical History:  Procedure Laterality Date  . None     History reviewed. No pertinent family history. Social History:  has no tobacco, alcohol, and drug history on file. Allergies: No Known Allergies Medications Prior to Admission  Medication Sig Dispense Refill  . acetaminophen (TYLENOL) 325 MG tablet Take 650 mg by mouth every 6 (six) hours as needed for headache (pain).    Marland Kitchen acetaZOLAMIDE (DIAMOX) 250 MG tablet Take 500 mg by mouth daily.    . benztropine (COGENTIN) 1 MG tablet Take 1 mg by mouth daily.    Marland Kitchen docusate sodium (COLACE) 100 MG capsule Take 200 mg by mouth at bedtime.    . dorzolamide-timolol (COSOPT) 22.3-6.8 MG/ML ophthalmic solution Place 1 drop into both eyes daily.    Marland Kitchen doxazosin (CARDURA) 2 MG tablet Take 1 mg by mouth daily.    Marland Kitchen latanoprost (XALATAN) 0.005 % ophthalmic solution Place 1 drop into both eyes at bedtime.    Marland Kitchen lisinopril-hydrochlorothiazide (PRINZIDE,ZESTORETIC) 20-12.5 MG tablet Take 1 tablet by mouth daily.    . Multiple Vitamins-Minerals (CERTAVITE SENIOR/ANTIOXIDANT) TABS Take 1 tablet by mouth daily.    . risperidone (RISPERDAL) 4 MG tablet Take 4 mg by mouth 2 (two) times daily.    . Tafluprost (ZIOPTAN) 0.0015 % SOLN Place 1 drop into both eyes daily.      Drug Regimen Review Drug regimen was reviewed and remains appropriate with no significant issues identified  Home: Home Living Family/patient expects to be discharged to:: Group home Additional Comments: Pt with decreased ability to report; reports from group home, does not work  Functional History: Prior Function Level of Independence: Needs assistance Gait / Transfers Assistance Needed: Independent with ambulation ADL's / Homemaking Assistance Needed: Independent for bathing  and dressing; sounds like group home provides meals  Functional Status:  Mobility: Bed Mobility Overal bed mobility: Needs Assistance Bed Mobility: Rolling, Sidelying to Sit, Sit to Sidelying Rolling: +2 for physical assistance, Mod assist Sidelying to sit: +2 for physical assistance, Total assist Supine to sit: Total assist, +2 for physical assistance Sit to supine: Total assist, +2 for physical assistance Sit to sidelying: Total assist, +2 for safety/equipment General bed mobility comments: Pt with improved core strength to assist/initiate with bed mobility this date. Pt assisting with hip/knee flexion. Transfers Overall transfer level: Needs assistance Equipment used: None Transfers: Sit to/from Stand Sit to Stand: Total assist, +2 physical assistance General transfer comment: Not attempted this session      ADL: ADL Overall ADL's : Needs assistance/impaired General ADL Comments: Total assistance at this time.   Cognition: Cognition Overall Cognitive Status: History of cognitive impairments - at baseline Orientation Level: Oriented X4 Cognition Arousal/Alertness: Awake/alert Behavior During Therapy: WFL for tasks assessed/performed Overall Cognitive Status: History of cognitive impairments - at baseline General Comments: Interacting but focused on laying back down this session.   Physical Exam: Blood pressure (!) 100/54, pulse (!) 110, temperature (!) 100.6 F (38.1 C), temperature source Rectal, resp. rate 11, SpO2 98 %. Physical Exam  Vitals reviewed. Constitutional: He appears well-developed.  Frail  HENT:  Head: Normocephalic and atraumatic.  Eyes: Right eye exhibits no discharge. Left eye exhibits no discharge. No scleral icterus.  Neck: Normal range of motion. Neck supple. No thyromegaly present.  Cardiovascular: Normal rate.  Bradycardia  Respiratory: Effort normal and breath sounds normal. No respiratory distress.  GI: Soft. Bowel sounds are normal. He  exhibits no distension.  Musculoskeletal: He exhibits no edema or tenderness.  Neurological: He is alert.  Patient provides his name and age.  Limited medical historian A&Ox3 Right lean Motor: B/l UE 0/5 proximal to distal B/l LE: HF 3-/5, KE 2+/5, distally 0/5  Skin: Skin is warm and dry.  Psychiatric: His behavior is normal. His affect is blunt.    Results for orders placed or performed during the hospital encounter of 08/12/17 (from the past 48 hour(s))  Basic metabolic panel     Status: Abnormal   Collection Time: 08/16/17  8:17 AM  Result Value Ref Range   Sodium 133 (L) 135 - 145 mmol/L   Potassium 4.4 3.5 - 5.1 mmol/L   Chloride 105 101 - 111 mmol/L   CO2 23 22 - 32 mmol/L   Glucose, Bld 108 (H) 65 - 99 mg/dL   BUN 8 6 - 20 mg/dL   Creatinine, Ser 0.60 (L) 0.61 - 1.24 mg/dL   Calcium 8.1 (L) 8.9 - 10.3 mg/dL   GFR calc non Af Amer >60 >60 mL/min   GFR calc Af Amer >60 >60 mL/min    Comment: (NOTE) The eGFR has been calculated using the CKD EPI equation. This calculation has not been validated in all clinical situations. eGFR's persistently <60 mL/min signify possible Chronic Kidney Disease.    Anion gap 5 5 - 15  Magnesium     Status: Abnormal   Collection Time: 08/16/17  8:17 AM  Result Value Ref Range   Magnesium 1.6 (L) 1.7 - 2.4 mg/dL   No results found.     Medical Problem List and Plan: 1.  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  DVT Prophylaxis/Anticoagulation: Subcutaneous heparin. Monitor for any bleeding episodes. Check vascular study 3. Pain Management: Hydrocodone as needed, Zanaflex 4 mg every 6 hours as needed 4. Mood/schizophrenia: Xanax 0.25 mg twice daily as needed 5. Neuropsych: This patient is capable of making decisions on his own behalf. 6. Skin/Wound Care: Routine skin checks 7. Fluids/Electrolytes/Nutrition: Routine I&O's with follow-up chemistries 8. Hypotension with  bradycardia with history of HTN. Bradycardia felt to be chronic. Follow-up cardiology services no further planned workup. Echocardiogram unremarkable. Troponin negative. Monitor with increased mobility 9. Hyponatremia. Follow-up chemistries 10. Dysphagia. Dysphagia #2 thin liquids. Follow-up speech therapy   Post Admission Physician Evaluation: 1. Preadmission assessment reviewed and changes made below. 2. Functional deficits secondary  to central cord injury/myelopathy with herniation. 3. Patient is admitted to receive collaborative, interdisciplinary care between the physiatrist, rehab nursing staff, and therapy team. 4. Patient's level of medical complexity and substantial therapy needs in context of that medical necessity cannot be provided at a lesser intensity of care such as a SNF. 5. Patient has experienced substantial functional loss from his/her baseline which was documented above under the "Functional History" and "Functional Status" headings.  Judging by the patient's diagnosis, physical exam, and functional history, the patient has potential for functional progress which will result in measurable gains while on inpatient rehab.  These gains will be of substantial and practical use upon discharge  in facilitating mobility and self-care at the household level. 41. Physiatrist will provide 24 hour management of medical needs as well as oversight of the therapy plan/treatment and provide guidance as appropriate regarding the interaction of the two. 7. 24 hour rehab nursing will assist with bladder management, bowel management, safety, skin/wound care, disease management, medication administration, pain management and patient education  and help integrate therapy concepts, techniques,education, etc. 8. PT will assess and treat for/with: Lower extremity strength, range of motion, stamina, balance, functional mobility, safety, adaptive techniques and equipment, woundcare, coping skills, pain  control, education.   Goals are: Mod A. 9. OT will assess and treat for/with: ADL's, functional mobility, safety, upper extremity strength, adaptive techniques and equipment, wound mgt, ego support, and community reintegration.   Goals are: Max A. Therapy may proceed with showering this patient. 10. SLP will assess and treat for/with: swallowing.  Goals are: Mod I. Therapy may proceed with showering this patient. 11. Case Management and Social Worker will assess and treat for psychological issues and discharge planning. 12. Team conference will be held weekly to assess progress toward goals and to determine barriers to discharge. 13. Patient will receive at least 3 hours of therapy per day at least 5 days per week. 14. ELOS: 30-35 days.       15. Prognosis:  good and fair  Delice Lesch, MD, ABPMR Lavon Paganini Angiulli, PA-C 08/17/2017

## 2017-08-17 NOTE — PMR Pre-admission (Signed)
PMR Admission Coordinator Pre-Admission Assessment  Patient: Dean Oliver is an 71 y.o., male MRN: [id number] DOB: Feb 15, 1946 Height:   Weight:                Insurance Information HMO:     PPO:      PCP:      IPA:      80/20:      OTHER:  PRIMARY: Medicaid       Policy#: [id number]      Subscriber: Self CM Name:       Phone#:      Fax#:  Pre-Cert#: Coverage Code: [id number]      Employer:  Benefits:  Phone #: [phone]     Name: Verified via an automated system  Eff. Date: Eligible as of 08/16/17     Deduct:       Out of Pocket Max:       Life Max:  CIR: Covered per Medicaid guidelines       SNF:  Outpatient:      Co-Pay:  Home Health:       Co-Pay:  DME:      Co-Pay:  Providers:   SECONDARY: Medicare B      Policy#: [id number]      Subscriber: Self CM Name:       Phone#:      Fax#:  Pre-Cert#:       Employer:  Benefits:  Phone #: Verified online     Name: [employer] Eff. Date: 11/19/14     Deduct:       Out of Pocket Max:       Life Max:  CIR:       SNF:  Outpatient:      Co-Pay:  Home Health:       Co-Pay:  DME:      Co-Pay:   Medicaid Application Date:       Case Manager:  Disability Application Date:       Case Worker:   Emergency Contact Information Contact Information    Name Relation Home Work Mobile   Dean Oliver  [phone]     Dean Oliver [phone]  [phone]   Dean Oliver Relative [phone]       Current Medical History  Patient Admitting Diagnosis: Cervical myelopathy with disc herniation  History of Present Illness: Dean Oliver Oliver 71 y.o.right handed malewith history of hypertension, schizophrenia. Per chart review patient resides in [facility] group home with baseline cognitive impairments. Presented 08/12/2017 after Oliver fall with complaints of weakness in the arms and legs. CT of the head showed artifact versus subcentimeter infarct in the left pons. MRI cervical spine showed spinal cord edema at C2-3 as well as  moderate spinal stenosis/central cord syndrome.Limited MRI brain reviewed, relatively unremarkable.Neurosurgery consulted not Oliver surgical candidate. Maintained in an Aspen cervical collarwhen out of bed. Bouts of hypotension as well as bradycardia required Levophed as well as IV hydration. Cardiology service was consulted. Troponin negative. Echocardiogram with ejection fraction of 60% no wall motion abnormalities. Bradycardia felt to be chronic no AV block noted. Cardiology services signed off 08/15/2017 no further recommendations. Subcutaneous heparin for DVT prophylaxis. Currently on dysphagia 2 textures and thin liquid diet. Physical and occupational therapy evaluations completed with recommendations of physical medicine rehabilitation consult. Patient was admitted for Oliver comprehensive rehabilitation program 08/17/17.  Past Medical History  Past Medical History:  Diagnosis Date  . Cataracts, bilateral   . HTN (hypertension)   .  Schizophrenia ([place])    Family History  family history is not on file.  Prior Rehab/Hospitalizations:  Has the patient had major surgery during 100 days prior to admission? No  Current Medications   Current Facility-Administered Medications:  .  0.9 %  sodium chloride infusion, , Intravenous, Continuous, Dean Hector, MD, Last Rate: 100 mL/hr at 08/17/17 0725 .  acetaminophen (TYLENOL) tablet 650 mg, 650 mg, Oral, Q4H PRN, Dean Records, MD, 650 mg at 08/17/17 0806 .  ALPRAZolam (XANAX) tablet 0.25 mg, 0.25 mg, Oral, BID PRN, Dean Records, MD .  aspirin EC tablet 81 mg, 81 mg, Oral, Daily, Dean Records, MD, 81 mg at 08/17/17 0816 .  heparin injection 5,000 Units, 5,000 Units, Subcutaneous, Q8H, Dean Records, MD, 5,000 Units at 08/17/17 0446 .  hydrALAZINE (APRESOLINE) injection 10 mg, 10 mg, Intravenous, Q6H PRN, Bodenheimer, Dean Oliver, 10 mg at 08/17/17 0446 .  HYDROcodone-acetaminophen (NORCO/VICODIN) 5-325 MG per tablet 1-2 tablet, 1-2 tablet, Oral,  Q6H PRN, Dean Moore, MD, 2 tablet at 08/17/17 0342 .  MUSCLE RUB CREA, , Topical, PRN, Dean Hector, MD .  nitroGLYCERIN (NITROSTAT) SL tablet 0.4 mg, 0.4 mg, Sublingual, Q5 Min x 3 PRN, Dean Records, MD .  ondansetron [facility]) injection 4 mg, 4 mg, Intravenous, Q6H PRN, Dean Records, MD .  potassium chloride SA (K-DUR,KLOR-CON) CR tablet 40 mEq, 40 mEq, Oral, BID, Dean Hector, MD, 40 mEq at 08/17/17 0815 .  tiZANidine (ZANAFLEX) tablet 4 mg, 4 mg, Oral, Q6H PRN, Dean Moore, MD, 4 mg at 08/16/17 0916 .  zolpidem (AMBIEN) tablet 5 mg, 5 mg, Oral, QHS PRN, Dean Records, MD  Patients Current Diet: DIET DYS 2 Room service appropriate? Yes; Fluid consistency: Thin  Precautions / Restrictions Precautions Precautions: Fall, Cervical Cervical Brace: Hard collar, Other (comment)(OK to apply in sitting) Restrictions Weight Bearing Restrictions: No   Has the patient had 2 or more falls or Oliver fall with injury in the past year?Yes  Prior Activity Level Community (5-7x/wk): Prior to admission patient lived in Oliver group home, but was fully independent with basic self care tasks and enjoyed going on outings.  He has family near by that also visit and enjoys joining them for the holidays.      Home Assistive Devices / Equipment    Prior Device Use: Indicate devices/aids used by the patient prior to current illness, exacerbation or injury? None of the above  Prior Functional Level Prior Function Level of Independence: Needs assistance Gait / Transfers Assistance Needed: Independent with ambulation ADL's / Homemaking Assistance Needed: Independent for bathing and dressing; sounds like group home provides meals  Self Care: Did the patient need help bathing, dressing, using the toilet or eating? Independent  Indoor Mobility: Did the patient need assistance with walking from room to room (with or without device)? Independent  Stairs: Did the patient need assistance with internal or  external stairs (with or without device)? Independent  Functional Cognition: Did the patient need help planning regular tasks such as shopping or remembering to take medications? Dependent  Current Functional Level Cognition  Overall Cognitive Status: History of cognitive impairments - at baseline Orientation Level: Oriented X4 General Comments: Interacting but focused on laying back down this session.     Extremity Assessment (includes Sensation/Coordination)  Upper Extremity Assessment: RUE deficits/detail, LUE deficits/detail RUE Deficits / Details: Significant trap tightness with limited GH and scapulothoracic movement with passive shoulder AROM in flexion  and abduction (abduction more limited than flexion). Trace triceps activation and otherwise 0/5 strength. Sensation dull but pt able to discriminate between locations.  LUE Deficits / Details: Trace triceps strength but otherwise 0/5 strength. Dull light touch sensation but able to discriminate location being touched.   Lower Extremity Assessment: Defer to PT evaluation RLE Deficits / Details: R hip flex grossly 2/5, knee ext 3/5, knee flex 3/5, ankle DF/PF 0/5; able to localize light touch sensation, but reports it feels numb RLE Sensation: decreased light touch RLE Coordination: decreased fine motor, decreased gross motor LLE Deficits / Details: L hip flex grossly 2/5, knee ext 3/5, knee flex 3/5, ankle DF/PF 0/5; able to localize light touch sensation, but reports it feels numb LLE Sensation: decreased light touch LLE Coordination: decreased fine motor, decreased gross motor    ADLs  Overall ADL's : Needs assistance/impaired General ADL Comments: Total assistance at this time.     Mobility  Overal bed mobility: Needs Assistance Bed Mobility: Rolling, Supine to Sit, Sit to Supine Rolling: +2 for physical assistance, Max assist Supine to sit: Total assist, +2 for physical assistance Sit to supine: Total assist, +2 for physical  assistance General bed mobility comments: MaxA to roll as pt able to assist with some hip flexion; totalA for sit<>supine    Transfers  Overall transfer level: Needs assistance Equipment used: None Transfers: Sit to/from Stand Sit to Stand: Total assist, +2 physical assistance General transfer comment: Unable to achieve fully upright standing with totalA (+2); pt able to initiate some hip extension (grossly 2/5)    Ambulation / Gait / Stairs / Wheelchair Mobility       Posture / Balance Dynamic Sitting Balance Sitting balance - Comments: Pt able to maintain balance for ~10sec at Oliver time with cues for anterior/lateral lean; other times requiring maxA for balance (easily fatigued) Balance Overall balance assessment: Needs assistance Sitting-balance support: No upper extremity supported, Feet supported Sitting balance-Leahy Scale: Fair Sitting balance - Comments: Pt able to maintain balance for ~10sec at Oliver time with cues for anterior/lateral lean; other times requiring maxA for balance (easily fatigued) Postural control: Posterior lean, Left lateral lean Standing balance support: Bilateral upper extremity supported Standing balance-Leahy Scale: Zero    Special needs/care consideration BiPAP/CPAP: No CPM: No Continuous Drip IV: No Dialysis: No         Life Vest: No Oxygen: No Special Bed: No Trach Size: No Wound Vac (area): No       Skin: WDL                               Bowel mgmt: Incontinent, last BM 08/17/17 Bladder mgmt: Incontinent, catheter due to spinal issues Diabetic mgmt: No     Previous [place] Name: [place] Additional Comments: Pt with decreased ability to report; reports from group home, does not work  Discharge Living Setting Plans for Discharge Living Setting: Other (Comment)(Likely SNF after reducing burden of care)  Social/Family/Support Systems Patient Roles: Other (Comment)(Family member ) Contact Information: Cytogeneticist, 2nd cousin: [name] Anticipated Caregiver: SNF after reducing [place] of care  Anticipated Caregiver's Contact Information: Pam home:[phone] [phone] Ability/Limitations of Caregiver: I have called group home director to clarify level of assist they cae provide, but voicemail was full and I was unable to leave Oliver message Discharge Plan Discussed with Primary Caregiver: Yes Is Caregiver In Agreement with Plan?: Yes Does Caregiver/Family have  Issues with Lodging/Transportation while Pt is in Rehab?: No  Goals/Additional Needs Patient/Family Goal for Rehab: PT/OT: Mod-Max Oliver; SLP: Mod I  Expected length of stay: ~30 days  Cultural Considerations: None Dietary Needs: Dys.2 textures and thin liquids  Equipment Needs: TBD Pt/Family Agrees to Admission and willing to participate: Yes Program Orientation Provided & Reviewed with Pt/Caregiver Including Roles  & Responsibilities: Yes Additional Information Needs: Clarify level of assist group home can provide with [name] [phone] Information Needs to be Provided By: CSW  Barriers to Discharge: Insurance for SNF coverage, Neurogenic Bowel & Bladder(SNF after reducing the burden of care )  Decrease burden of Care through IP rehab admission: Decrease number of caregivers and Bowel and bladder program  Possible need for SNF placement upon discharge: Yes, and family would prefer [facility] SNF in [place]; however, aware that if no beds are available or not accepting patient's they will need to expand the search.  Patient Condition: This patient's medical and functional status has changed since the consult dated 08/16/17 in which the Rehabilitation Physician determined and documented that the patient was potentially appropriate for intensive rehabilitative care in an inpatient rehabilitation facility. Issues have been addressed and update has been discussed with Dr. Posey Pronto and patient now appropriate for inpatient rehabilitation. Will  admit to inpatient rehab today.   Preadmission Screen Completed By:  Gunnar Fusi, 08/17/2017 1:47 PM ______________________________________________________________________   Discussed status with Dr. Posey Pronto on 08/17/17 at 1405 and received telephone approval for admission today.  Admission Coordinator:  Gunnar Fusi, time 1405/Date 08/17/17

## 2017-08-17 NOTE — Discharge Summary (Signed)
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 - MRI reveals acute C2-3 spinal cord contusion -Received one dose steroids however per EMR neurosurgery felt risk of continued steroid treatment outweighed benefits.  -Per neurosurgery He will need therapy and rehabilitation/placement. do not recommend early surgical intervention as he does not have ongoing cord compression and therefore early surgery likely will not change the neurologic outcome. -Neurosurgery would like patient to follow-up 2 weeks post discharge from hospital at which time will most likely repeat MRI of the C-spine prior to considering surgical intervention.   Neurogenic bladder -continue Foley   Bradycardia:  -per cardiology most likely chronic, negative AV block. -No indicated for pacer. 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  History of present illness:  71 y.o. BM  PMHx Schizophrenia, HTN, cataracts, who is being seen today for the evaluation of bradycardia at the request of Dr .Gilford Raid.   Over the past couple days he says he has been dizzy  Passed out  Details not clear  Patient is a difficult historian  He deneis CP  No N/V  Occasional loose stool   Slightly feverish at times  Family member is here  She says that pt got picked up yesterday at about 10 am for Thanksgiving dinner  Stayed until about 10 pm  One family member says he wasn't himself During his hospitalization patient was diagnosed with central cord syndrome.evaluated by neurosurgerywho stated currently nonoperative. Conservative management withphysical therapy preferred treatment. Possible future neurosurgery once patient has recovered Bradycardia evaluated by cardiology resolved once medication adjusted Stable for discharge   Procedures: 11/23 PCXR:-negative acute findings 11/23 DG ZDG:[id number] acute fracture/dislocation 11/23 DG left shoulder: Negativeacute fracture 11/24 CT head WO contrast:-Artifact versus subcentimeter infarct in the left pons. -negative evidence of acute supratentorial infarct or hemorrhage. 11/24 MRI brain WO contrast:-Spinal cord edema at C2-3, suspicious for traumatic contusion in the setting of recent falls and congenital and acquired spinal stenosis at this level.  - Mild prevertebral edema from C2-C4 also likely related to recent trauma. Vertebral marrow edema from C4-C6 with mild vertebral body wedging at these levels may also be reflective of acute fractures versus degenerative edema. Cervical spine CT is recommended to further assess for fracture. -Moderate congenital and acquired spinal stenosis from C2-3 to C4-5. Moderate to severe multilevel neural foraminal stenosis as above. 11/24 CT C-spine WO contrast:No CT evidence of acute fracture malalignment of the cervical spine.   Pronounced posterior disc osteophyte complex with central posterior disc protrusion at the C2-C3 level (level of cord injury on prior MR) contacts the ventral thecal sac/cord, contributing to acquired stenosis at this level.   Posterior disc osteophyte complex at additional levels of C3-C4, C4-C5, C5-C6 with associated central/ paracentral disc protrusion, as above,  also resulting in  acquired stenosis.   Mild edema in the retropharyngeal region corresponds to findings on prior MR, favored to represent soft tissue injury in the absence of fracture.     Consultations: Neurosurgery Neurology Cardiology     Discharge Exam: Vitals:   08/17/17 0440 08/17/17 0455 08/17/17 0724 08/17/17 1216  BP: (!) 177/83 (!) 168/79  (!) 100/54  Pulse: (!) 54 (!) 50  (!) 110  Resp: 17 11    Temp:   (!) 100.6 F (38.1 C)   TempSrc:   Rectal   SpO2: 98% 98%      General:  A/O 4, No acute respiratory distress Neck:  Negative scars, masses, positive torticollis, positive pain to palpation cervical area, negative JVD Lungs: Clear to auscultation bilaterally without wheezes or crackles Cardiovascular: Regular rate and rhythm without murmur gallop or rub normal S1 and S2    Discharge Instructions   Allergies as of 08/17/2017   No Known Allergies     Medication List    STOP taking these medications   acetaZOLAMIDE 250 MG tablet Commonly known as:  DIAMOX   benztropine 1 MG tablet Commonly known as:  COGENTIN   docusate sodium 100 MG capsule Commonly known as:  COLACE   doxazosin 2 MG tablet Commonly known as:  CARDURA   lisinopril-hydrochlorothiazide 20-12.5 MG tablet Commonly known as:  PRINZIDE,ZESTORETIC   ZIOPTAN 0.0015 % Soln Generic drug:  Tafluprost     TAKE these medications   acetaminophen 325 MG tablet Commonly known as:  TYLENOL Take 2 tablets (650 mg total) by mouth every 4 (four) hours as needed for headache or mild pain. What changed:    when to take this  reasons to take this   ALPRAZolam 0.25 MG tablet Commonly known as:  XANAX Take 1 tablet (0.25 mg total) by mouth 2 (two) times daily as needed for anxiety.   aspirin 81 MG EC tablet Take 1 tablet (81 mg total) by mouth daily. Start taking on:  96/29/5284   CERTAVITE SENIOR/ANTIOXIDANT Tabs Take 1 tablet by mouth daily.   dorzolamide-timolol 22.3-6.8 MG/ML  ophthalmic solution Commonly known as:  COSOPT Place 1 drop into both eyes daily.   HYDROcodone-acetaminophen 5-325 MG tablet Commonly known as:  NORCO/VICODIN Take 1-2 tablets by mouth every 6 (six) hours as needed for moderate pain.   latanoprost 0.005 % ophthalmic solution Commonly known as:  XALATAN Place 1 drop into both eyes at bedtime.   MUSCLE RUB 10-15 % Crea Apply 1 application topically as needed for muscle pain.   potassium chloride SA 20 MEQ tablet Commonly known as:  K-DUR,KLOR-CON Take 2 tablets (40 mEq total) by mouth 2 (two) times daily.   risperidone 4 MG tablet Commonly known as:  RISPERDAL Take 4 mg by mouth 2 (two) times daily.   tiZANidine 4 MG tablet Commonly known as:  ZANAFLEX Take 1 tablet (4 mg total) by mouth every 6 (six) hours as needed for muscle spasms.   zolpidem 5 MG tablet Commonly known as:  AMBIEN Take 1 tablet (5 mg total) by mouth at bedtime as needed for sleep.      No Known Allergies    The results of significant diagnostics from this hospitalization (including imaging, microbiology, ancillary and laboratory) are listed below for reference.    Significant Diagnostic Studies: Ct Cervical Spine Wo Contrast  Result Date: 08/13/2017 CLINICAL DATA:  71 year old male with a history of fall and cervical cord edema on MRI EXAM: CT CERVICAL SPINE WITHOUT CONTRAST TECHNIQUE: Multidetector  CT imaging of the cervical spine was performed without intravenous contrast. Multiplanar CT image reconstructions were also generated. COMPARISON:  None. FINDINGS: Alignment: Cervical vertebral bodies maintain alignment with no subluxation, retrolisthesis, anterolisthesis. Skull base and vertebrae: Craniocervical junction aligned. No skullbase fracture identified. Similar configuration of vertebral bodies compared to the MR with mild body height loss of C4 and C5. No fracture line identified. Soft tissues and spinal canal: No hyperdense material within the  spinal canal. Calcifications of the bilateral carotid system. No focal fluid collection. Questionable trace low-density edema in the retropharyngeal soft tissues, corresponds to findings on prior MRI. Disc levels: C2-C3: Posterior disc osteophyte complex with central posterior disc protrusion appearing to contact the ventral aspect of the cord. Uncovertebral joint disease contributes to minimal bilateral neural foraminal narrowing. C3-C4: Posterior disc osteophyte complex with right eccentric disc bulge/protrusion appears to contact the ventral thecal sac. Bilateral uncovertebral joint disease contributes to minimal neural foraminal narrowing. C4-C5: Posterior disc osteophyte complex with central disc bulge/protrusion appears to contact the ventral thecal sac/cord. Uncovertebral joint disease contributes to minimal bilateral neural foraminal narrowing. C5-C6: Posterior disc osteophyte complex with left eccentric disc bulge/protrusion appears to contact the left aspect of the thecal sac. No significant neural foraminal narrowing. C6-C7: Mild degenerative disc disease with posterior disc osteophyte complex and no significant compression on the ventral thecal sac. Upper chest: Unremarkable Other: None IMPRESSION: No CT evidence of acute fracture malalignment of the cervical spine. Pronounced posterior disc osteophyte complex with central posterior disc protrusion at the C2-C3 level (level of cord injury on prior MR) contacts the ventral thecal sac/cord, contributing to acquired stenosis at this level. Posterior disc osteophyte complex at additional levels of C3-C4, C4-C5, C5-C6 with associated central/ paracentral disc protrusion, as above, also resulting in acquired stenosis. Mild edema in the retropharyngeal region corresponds to findings on prior MR, favored to represent soft tissue injury in the absence of fracture. Carotid vascular disease. Electronically Signed   By: Corrie Mckusick D.O.   On: 08/13/2017 12:22    Mr Brain Wo Contrast  Result Date: 08/13/2017 CLINICAL DATA:  Weakness in all 4 extremities. Symptomatic bradycardia with hypotension. Syncope. Recent falls. EXAM: MRI HEAD WITHOUT CONTRAST MRI CERVICAL SPINE WITHOUT CONTRAST TECHNIQUE: Multiplanar, multiecho pulse sequences of the brain and surrounding structures, and cervical spine, to include the craniocervical junction and cervicothoracic junction, were obtained without intravenous contrast. COMPARISON:  Head CT 08/13/2017 FINDINGS: MRI HEAD FINDINGS At the request of the stroke neurologist, only axial and coronal diffusion and axial FLAIR imaging was performed. There is no restricted diffusion to indicate acute infarct. The ventricles and sulci are normal. Small T2 hyperintense focus in the left lentiform nucleus is without evidence of significant associated gliosis on FLAIR imaging, favoring a dilated perivascular space over chronic lacunar infarct. No significant cerebral white matter disease is seen for age. No intracranial mass effect or extra-axial fluid collection is identified. MRI CERVICAL SPINE FINDINGS The study is mildly motion degraded. Alignment: Trace retrolisthesis of C4 on C5. Vertebrae: There is abnormal marrow edema throughout much of the C4 and C5 vertebral bodies as well as along the C6 superior endplate. There is mild anterior wedging of the C5 greater than C6 vertebral bodies, and there is also mild central depression of the C4 inferior endplate. There is some T2/STIR signal hyperintensity in the C5-6 disc space which may be traumatic or degenerative, without endplate erosion to specifically suggest infection. Cord: Extensive T2 hyperintensity involving nearly the entire cross-section of the cord  at C2-3. Milder T2 hyperintensity is suspected in the cord at C3-4. There is increased trace diffusion signal in the cord at C2-3, however this is favored to reflect T2 shine through rather than acute infarct given the lack of reduced ADC  and the configuration/extensiveness of the cord T2 signal abnormality without preferential gray matter involvement. Posterior Fossa, vertebral arteries, paraspinal tissues: Grossly preserved vertebral artery flow voids. Mild prevertebral edema/fluid from C2-C4. No significant edema suggestive of of posterior ligamentous injury. No discrete anterior or posterior longitudinal ligament disruption is identified. 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 C2-3: Small right central disc protrusion and infolding of the ligamentum flavum result in moderate spinal stenosis with mild cord flattening and mild-to-moderate bilateral neural foraminal stenosis. C3-4: Disc bulging, ankle vertebral spurring, and infolding of the ligamentum flavum result in moderate spinal stenosis and moderate right and mild left neural foraminal stenosis. C4-5: Disc bulging, uncovertebral spurring, and mild infolding of the ligamentum flavum result in moderate spinal stenosis and severe right and mild-to-moderate left neural foraminal stenosis. C5-6: Mild disc bulging and uncovertebral spurring result in moderate to severe right and moderate left neural foraminal stenosis and borderline to mild spinal stenosis. C6-7: Small central disc protrusion without spinal stenosis. Mild bilateral neural foraminal narrowing due to uncovertebral spurring. C7-T1:  Negative. IMPRESSION: 1. Spinal cord edema at C2-3, suspicious for traumatic contusion in the setting of recent falls and congenital and acquired spinal stenosis at this level. This is not felt to be consistent with infarct, although there could be a component of underlying chronic myelopathy secondary to degenerative stenosis. 2. Mild prevertebral edema from C2-C4 also likely related to recent trauma. Vertebral marrow edema from C4-C6 with mild vertebral body wedging at these levels may also be reflective of acute fractures versus degenerative edema. Cervical  spine CT is recommended to further assess for fracture. 3. Moderate congenital and acquired spinal stenosis from C2-3 to C4-5. Moderate to severe multilevel neural foraminal stenosis as above. These results were called by telephone at the time of interpretation on 08/13/2017 at 9:23 am to Dr. Cheral Marker, who verbally acknowledged these results. Electronically Signed   By: Logan Bores M.D.   On: 08/13/2017 09:33   Mr Cervical Spine Wo Contrast  Result Date: 08/13/2017 CLINICAL DATA:  Weakness in all 4 extremities. Symptomatic bradycardia with hypotension. Syncope. Recent falls. EXAM: MRI HEAD WITHOUT CONTRAST MRI CERVICAL SPINE WITHOUT CONTRAST TECHNIQUE: Multiplanar, multiecho pulse sequences of the brain and surrounding structures, and cervical spine, to include the craniocervical junction and cervicothoracic junction, were obtained without intravenous contrast. COMPARISON:  Head CT 08/13/2017 FINDINGS: MRI HEAD FINDINGS At the request of the stroke neurologist, only axial and coronal diffusion and axial FLAIR imaging was performed. There is no restricted diffusion to indicate acute infarct. The ventricles and sulci are normal. Small T2 hyperintense focus in the left lentiform nucleus is without evidence of significant associated gliosis on FLAIR imaging, favoring a dilated perivascular space over chronic lacunar infarct. No significant cerebral white matter disease is seen for age. No intracranial mass effect or extra-axial fluid collection is identified. MRI CERVICAL SPINE FINDINGS The study is mildly motion degraded. Alignment: Trace retrolisthesis of C4 on C5. Vertebrae: There is abnormal marrow edema throughout much of the C4 and C5 vertebral bodies as well as along the C6 superior endplate. There is mild anterior wedging of the C5 greater than C6 vertebral bodies, and there is also mild central depression of the C4 inferior endplate.  There is some T2/STIR signal hyperintensity in the C5-6 disc space which  may be traumatic or degenerative, without endplate erosion to specifically suggest infection. Cord: Extensive T2 hyperintensity involving nearly the entire cross-section of the cord at C2-3. Milder T2 hyperintensity is suspected in the cord at C3-4. There is increased trace diffusion signal in the cord at C2-3, however this is favored to reflect T2 shine through rather than acute infarct given the lack of reduced ADC and the configuration/extensiveness of the cord T2 signal abnormality without preferential gray matter involvement. Posterior Fossa, vertebral arteries, paraspinal tissues: Grossly preserved vertebral artery flow voids. Mild prevertebral edema/fluid from C2-C4. No significant edema suggestive of of posterior ligamentous injury. No discrete anterior or posterior longitudinal ligament disruption is identified. 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 C2-3: Small right central disc protrusion and infolding of the ligamentum flavum result in moderate spinal stenosis with mild cord flattening and mild-to-moderate bilateral neural foraminal stenosis. C3-4: Disc bulging, ankle vertebral spurring, and infolding of the ligamentum flavum result in moderate spinal stenosis and moderate right and mild left neural foraminal stenosis. C4-5: Disc bulging, uncovertebral spurring, and mild infolding of the ligamentum flavum result in moderate spinal stenosis and severe right and mild-to-moderate left neural foraminal stenosis. C5-6: Mild disc bulging and uncovertebral spurring result in moderate to severe right and moderate left neural foraminal stenosis and borderline to mild spinal stenosis. C6-7: Small central disc protrusion without spinal stenosis. Mild bilateral neural foraminal narrowing due to uncovertebral spurring. C7-T1:  Negative. IMPRESSION: 1. Spinal cord edema at C2-3, suspicious for traumatic contusion in the setting of recent falls and congenital and acquired  spinal stenosis at this level. This is not felt to be consistent with infarct, although there could be a component of underlying chronic myelopathy secondary to degenerative stenosis. 2. Mild prevertebral edema from C2-C4 also likely related to recent trauma. Vertebral marrow edema from C4-C6 with mild vertebral body wedging at these levels may also be reflective of acute fractures versus degenerative edema. Cervical spine CT is recommended to further assess for fracture. 3. Moderate congenital and acquired spinal stenosis from C2-3 to C4-5. Moderate to severe multilevel neural foraminal stenosis as above. These results were called by telephone at the time of interpretation on 08/13/2017 at 9:23 am to Dr. Cheral Marker, who verbally acknowledged these results. Electronically Signed   By: Logan Bores M.D.   On: 08/13/2017 09:33   Dg Chest Portable 1 View  Result Date: 08/12/2017 CLINICAL DATA:  Hypotension. EXAM: PORTABLE CHEST 1 VIEW COMPARISON:  None. FINDINGS: Mild cardiomegaly is noted. No pneumothorax or pleural effusion is noted. Both lungs are clear. The visualized skeletal structures are unremarkable. IMPRESSION: No acute cardiopulmonary abnormality seen. Electronically Signed   By: Marijo Conception, M.D.   On: 08/12/2017 16:44   Dg Shoulder Left Port  Result Date: 08/12/2017 CLINICAL DATA:  71 year old male with multiple recent falls. Left shoulder and hip pain and limited range of motion. EXAM: LEFT SHOULDER - 1 VIEW COMPARISON:  None. FINDINGS: There is no evidence of fracture or dislocation. There is no evidence of arthropathy or other focal bone abnormality. Soft tissues are unremarkable. IMPRESSION: Negative. Electronically Signed   By: Kristopher Oppenheim M.D.   On: 08/12/2017 20:37   Dg Hip Port Unilat With Pelvis 1v Left  Result Date: 08/12/2017 CLINICAL DATA:  72 year old male with multiple falls recently. Pain to left shoulder and hip with limited range of motion. EXAM: DG HIP (WITH  OR WITHOUT  PELVIS) 1V PORT LEFT COMPARISON:  None. FINDINGS: There is no evidence of hip fracture or dislocation. There is no evidence of arthropathy or other focal bone abnormality. IMPRESSION: No definite radiographic evidence of acute fracture or dislocation. Electronically Signed   By: Kristopher Oppenheim M.D.   On: 08/12/2017 20:36   Ct Head Code Stroke Wo Contrast`  Result Date: 08/13/2017 CLINICAL DATA:  Code stroke. Bilateral upper and lower extremity weakness. EXAM: CT HEAD WITHOUT CONTRAST TECHNIQUE: Contiguous axial images were obtained from the base of the skull through the vertex without intravenous contrast. COMPARISON:  None. FINDINGS: Brain: There is an 8 mm focus of mild hypoattenuation in the left ventrolateral pons (series 3, images 8 and 9). There is no evidence of acute cortically based supratentorial infarct, intracranial hemorrhage, mass, midline shift, or extra-axial fluid collection. The ventricles and sulci are normal. Vascular: Calcified atherosclerosis at the skullbase. No hyperdense vessel. Skull: No fracture or focal osseous lesion. Sinuses/Orbits: Visualized paranasal sinuses are clear. Minimal chronic appearing left mastoid air cell opacification. Unremarkable orbits. Other: None. ASPECTS [facility] Stroke Program Early CT Score) Not scored due to non-localizing symptoms. IMPRESSION: 1. 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 2. No evidence of acute supratentorial infarct or hemorrhage. Results sent via text page to Dr. Lorraine Lax on 08/13/2017 at 6:52 a.m. Electronically Signed   By: Logan Bores M.D.   On: 08/13/2017 06:53    Microbiology: Recent Results (from the past 240 hour(s))  MRSA PCR Screening     Status: None   Collection Time: 08/12/17  6:31 PM  Result Value Ref Range Status   MRSA by PCR NEGATIVE NEGATIVE Final    Comment:        The GeneXpert MRSA Assay (FDA approved for NASAL specimens only), is one component of a comprehensive MRSA colonization surveillance  program. It is not intended to diagnose MRSA infection nor to guide or monitor treatment for MRSA infections.   Culture, blood (Routine X 2) w Reflex to ID Panel     Status: None (Preliminary result)   Collection Time: 08/12/17  6:59 PM  Result Value Ref Range Status   Specimen Description BLOOD BLOOD LEFT ARM  Final   Special Requests IN PEDIATRIC BOTTLE Blood Culture adequate volume  Final   Culture NO GROWTH 4 DAYS  Final   Report Status PENDING  Incomplete     Labs: Basic Metabolic Panel: Recent Labs  Lab 08/12/17 1547 08/12/17 1621 08/12/17 1920 08/13/17 0824 08/14/17 0649 08/16/17 0817  NA 137 139  --  137 136 133*  K 3.3* 3.8  --  3.1* 3.8 4.4  CL 111 113*  --  113* 112* 105  CO2 21*  --   --  18* 20* 23  GLUCOSE 116* 125*  --  112* 103* 108*  BUN 17 15  --  12 12 8   CREATININE 1.07 1.00 0.94 0.76 0.71 0.60*  CALCIUM 8.9  --   --  8.1* 8.3* 8.1*  MG  --   --   --   --   --  1.6*   Liver Function Tests: Recent Labs  Lab 08/12/17 1547  AST 20  ALT 13*  ALKPHOS 73  BILITOT 0.6  PROT 6.7  ALBUMIN 3.8   No results for input(s): LIPASE, AMYLASE in the last 168 hours. No results for input(s): AMMONIA in the last 168 hours. CBC: Recent Labs  Lab 08/12/17 1547 08/12/17 1621 08/12/17 1920 08/13/17 0824  WBC  4.1  --  7.1 6.4  NEUTROABS 2.3  --   --   --   HGB 10.8* 9.9* 10.6* 10.7*  HCT 35.0* 29.0* 33.1* 32.4*  MCV 90.0  --  86.2 85.5  PLT 220  --  224 205   Cardiac Enzymes: Recent Labs  Lab 08/12/17 1547 08/12/17 1920 08/13/17 0547 08/13/17 0824  TROPONINI <0.03 <0.03 <0.03 <0.03   BNP: BNP (last 3 results) No results for input(s): BNP in the last 8760 hours.  ProBNP (last 3 results) No results for input(s): PROBNP in the last 8760 hours.  CBG: Recent Labs  Lab 08/13/17 0612  GLUCAP 115*       Signed:  Dia Crawford, MD [facility] [phone] pager

## 2017-08-17 NOTE — Progress Notes (Signed)
Physical Therapy Treatment Patient Details Name: Dean Oliver MRN: [id number] DOB: Sep 13, 1946 Today's Date: 08/17/2017    History of Present Illness Pt is a 71 y.o. male admitted on 08/12/17 with c/o increased weakness after a fall; pt resides at a group home with baseline cognitive impairments and is a difficult historian. Cervical CT showed no acute fx; MRI showed acute C2-3 spinal cord contusion. Neurology suspects central cord syndrome; neurosurgery determined pt is not a candidate for early surgical intervention. Pertinent PMH includes HTN, bilateral cataracts, schizophrenia.     PT Comments    Pt making steady progress. Continue to recommend CIR for further rehab.   Follow Up Recommendations  CIR     Equipment Recommendations  Other (comment)(To be determined in next venue)    Recommendations for Other Services       Precautions / Restrictions Precautions Precautions: Fall;Cervical Required Braces or Orthoses: Cervical Brace Cervical Brace: Hard collar;Other (comment)(OK to apply in sitting) Restrictions Weight Bearing Restrictions: No    Mobility  Bed Mobility Overal bed mobility: Needs Assistance Bed Mobility: Rolling;Sidelying to Sit;Sit to Sidelying Rolling: +2 for physical assistance;Mod assist Sidelying to sit: +2 for physical assistance;Total assist     Sit to sidelying: Total assist;+2 for safety/equipment General bed mobility comments: Pt with improved core strength to assist/initiate with bed mobility this date. Pt assisting with hip/knee flexion.  Transfers                 General transfer comment: Not attempted this session  Ambulation/Gait                 Stairs            Wheelchair Mobility    Modified Rankin (Stroke Patients Only)       Balance Overall balance assessment: Needs assistance Sitting-balance support: No upper extremity supported;Feet supported Sitting balance-Leahy Scale: Poor Sitting balance -  Comments: Pt sat EOB x 15 minutes primarily with min assist. Pt able to sit with min guard for 20-30 seconds several times. Occasional mod assist when pt's trunk too far out of base of support to correct. Had pt prop on elbow to each side. Required total assist to return to upright. Postural control: Posterior lean;Left lateral lean;Other (comment)(anterior lean)                                  Cognition Arousal/Alertness: Awake/alert Behavior During Therapy: WFL for tasks assessed/performed Overall Cognitive Status: History of cognitive impairments - at baseline                                 General Comments: Interacting but focused on laying back down this session.       Exercises Other Exercises Other Exercises: Facilitated PROM in all planes for B UE.  Other Exercises: facilitated weight bearing through B elbows with lateral leans  Other Exercises: Facilitated improved core activation in preparation for ADL to return to midline positioning    General Comments General comments (skin integrity, edema, etc.): VSS      Pertinent Vitals/Pain Pain Assessment: Faces Faces Pain Scale: Hurts little more Pain Location: posterior neck, shoulders (R>L) Pain Descriptors / Indicators: Discomfort;Sore Pain Intervention(s): Monitored during session;Repositioned    Home Living  Prior Function            PT Goals (current goals can now be found in the care plan section) Acute Rehab PT Goals Patient Stated Goal: None stated Progress towards PT goals: Progressing toward goals    Frequency    Min 3X/week      PT Plan Current plan remains appropriate    Co-evaluation PT/OT/SLP Co-Evaluation/Treatment: Yes Reason for Co-Treatment: Complexity of the patient's impairments (multi-system involvement);For patient/therapist safety PT goals addressed during session: Mobility/safety with mobility;Balance OT goals addressed  during session: ADL's and self-care;Strengthening/ROM      AM-PAC PT "6 Clicks" Daily Activity  Outcome Measure  Difficulty turning over in bed (including adjusting bedclothes, sheets and blankets)?: Unable Difficulty moving from lying on back to sitting on the side of the bed? : Unable Difficulty sitting down on and standing up from a chair with arms (e.g., wheelchair, bedside commode, etc,.)?: Unable Help needed moving to and from a bed to chair (including a wheelchair)?: Total Help needed walking in hospital room?: Total Help needed climbing 3-5 steps with a railing? : Total 6 Click Score: 6    End of Session   Activity Tolerance: Patient tolerated treatment well Patient left: in bed;Other (comment)(Pt unable to use call bell. Nurse aware) Nurse Communication: Mobility status;Need for lift equipment PT Visit Diagnosis: Other abnormalities of gait and mobility (R26.89);Other symptoms and signs involving the nervous system (R29.898)     Time: 1610-9604 PT Time Calculation (min) (ACUTE ONLY): 29 min  Charges:  $Therapeutic Activity: 8-22 mins                    G Codes:       [facility] PT [place] 08/17/2017, 2:36 PM

## 2017-08-17 NOTE — Care Management Important Message (Signed)
Important Message  Patient Details  Name: Dean Oliver MRN: [id number] Date of Birth: March 18, 1946   Medicare Important Message Given:  Yes    Nathen May 08/17/2017, 12:28 PM

## 2017-08-18 ENCOUNTER — Encounter (HOSPITAL_COMMUNITY): Payer: Medicare Other

## 2017-08-18 ENCOUNTER — Inpatient Hospital Stay (HOSPITAL_COMMUNITY): Payer: Medicare Other | Admitting: Occupational Therapy

## 2017-08-18 ENCOUNTER — Inpatient Hospital Stay (HOSPITAL_COMMUNITY): Payer: Medicare Other | Admitting: Speech Pathology

## 2017-08-18 ENCOUNTER — Inpatient Hospital Stay (HOSPITAL_COMMUNITY): Payer: Medicare Other | Admitting: Physical Therapy

## 2017-08-18 DIAGNOSIS — A499 Bacterial infection, unspecified: Secondary | ICD-10-CM

## 2017-08-18 DIAGNOSIS — N319 Neuromuscular dysfunction of bladder, unspecified: Secondary | ICD-10-CM

## 2017-08-18 DIAGNOSIS — K592 Neurogenic bowel, not elsewhere classified: Secondary | ICD-10-CM

## 2017-08-18 DIAGNOSIS — S14129S Central cord syndrome at unspecified level of cervical spinal cord, sequela: Secondary | ICD-10-CM

## 2017-08-18 DIAGNOSIS — N390 Urinary tract infection, site not specified: Secondary | ICD-10-CM

## 2017-08-18 DIAGNOSIS — E871 Hypo-osmolality and hyponatremia: Secondary | ICD-10-CM

## 2017-08-18 LAB — CBC WITH DIFFERENTIAL/PLATELET
BASOS ABS: 0 10*3/uL (ref 0.0–0.1)
Basophils Relative: 0 %
Eosinophils Absolute: 0 10*3/uL (ref 0.0–0.7)
Eosinophils Relative: 0 %
HEMATOCRIT: 36 % — AB (ref 39.0–52.0)
Hemoglobin: 11.7 g/dL — ABNORMAL LOW (ref 13.0–17.0)
LYMPHS PCT: 12 %
Lymphs Abs: 1.1 10*3/uL (ref 0.7–4.0)
MCH: 27.1 pg (ref 26.0–34.0)
MCHC: 32.5 g/dL (ref 30.0–36.0)
MCV: 83.3 fL (ref 78.0–100.0)
Monocytes Absolute: 0.5 10*3/uL (ref 0.1–1.0)
Monocytes Relative: 6 %
NEUTROS ABS: 7.1 10*3/uL (ref 1.7–7.7)
Neutrophils Relative %: 82 %
PLATELETS: 226 10*3/uL (ref 150–400)
RBC: 4.32 MIL/uL (ref 4.22–5.81)
RDW: 12.4 % (ref 11.5–15.5)
WBC: 8.7 10*3/uL (ref 4.0–10.5)

## 2017-08-18 LAB — COMPREHENSIVE METABOLIC PANEL
ALBUMIN: 2.8 g/dL — AB (ref 3.5–5.0)
ALT: 28 U/L (ref 17–63)
AST: 39 U/L (ref 15–41)
Alkaline Phosphatase: 66 U/L (ref 38–126)
Anion gap: 8 (ref 5–15)
BILIRUBIN TOTAL: 1 mg/dL (ref 0.3–1.2)
BUN: 22 mg/dL — AB (ref 6–20)
CHLORIDE: 101 mmol/L (ref 101–111)
CO2: 20 mmol/L — ABNORMAL LOW (ref 22–32)
CREATININE: 1.09 mg/dL (ref 0.61–1.24)
Calcium: 8.4 mg/dL — ABNORMAL LOW (ref 8.9–10.3)
GFR calc Af Amer: >60 mL/min (ref >60)
GLUCOSE: 122 mg/dL — AB (ref 65–99)
Potassium: 5 mmol/L (ref 3.5–5.1)
Sodium: 129 mmol/L — ABNORMAL LOW (ref 135–145)
TOTAL PROTEIN: 5.8 g/dL — AB (ref 6.5–8.1)

## 2017-08-18 LAB — URINALYSIS, COMPLETE (UACMP) WITH MICROSCOPIC
Bilirubin Urine: NEGATIVE
Glucose, UA: NEGATIVE mg/dL
KETONES UR: NEGATIVE mg/dL
NITRITE: NEGATIVE
PH: 9 — AB (ref 5.0–8.0)
Protein, ur: 100 mg/dL — AB
SPECIFIC GRAVITY, URINE: 1.014 (ref 1.005–1.030)

## 2017-08-18 MED ORDER — CIPROFLOXACIN HCL 250 MG PO TABS
250.0000 mg | ORAL_TABLET | Freq: Two times a day (BID) | ORAL | Status: DC
  Administered 2017-08-18 – 2017-08-20 (×4): 250 mg via ORAL
  Filled 2017-08-18 (×4): qty 1

## 2017-08-18 NOTE — Progress Notes (Signed)
Bladder scan for greater than 999, penis, scrotum, and bil lower ext are edematous.  Removed foley, foley not draining, removed foley, and I/o cath with pressure relief noted by pt. Urine orange with green slime, sediment, and malodorous. Urine sent to lab. Foley not placed d/t being removed tomorrow. Notified PA

## 2017-08-18 NOTE — Evaluation (Signed)
Speech Language Pathology Assessment and Plan  Patient Details  Name: Dean Oliver MRN: [id number] Date of Birth: [birth year]  SLP Diagnosis: Dysphagia  Rehab Potential: Good ELOS: 1-2 additional sessions     Today's Date: 08/18/2017 SLP Individual Time: 8563-1497 SLP Individual Time Calculation (min): 55 min   Problem List:  Patient Active Problem List   Diagnosis Date Noted  . Neurogenic bladder 08/18/2017  . Neurogenic bowel 08/18/2017  . Bacterial UTI 08/18/2017  . Central cord syndrome ([place]) 08/17/2017  . Dysphagia   . Fall   . Hypotension   . Sinus bradycardia   . HNP (herniated nucleus pulposus) with myelopathy, cervical   . Benign essential HTN   . Hyponatremia   . Acute blood loss anemia   . Symptomatic bradycardia 08/12/2017  . Schizophrenia [facility])    Past Medical History:  Past Medical History:  Diagnosis Date  . Cataracts, bilateral   . HTN (hypertension)   . Schizophrenia [facility])    Past Surgical History:  Past Surgical History:  Procedure Laterality Date  . None      Assessment / Plan / Recommendation Clinical Impression   Dean Oliver [facility] a 71 y.o.right handed malewith history of hypertension, schizophrenia. Per chart review patient resides in a group home with baseline cognitive impairments. Presented 08/12/2017 after a fall with complaints of weakness in the arms and legs. CT of the head showed artifact versus subcentimeter infarct in the left pons. MRI cervical spine showed spinal cord edema at C2-3 as well as moderate spinal stenosis/central cord syndrome.Limited MRI brain reviewed, relatively unremarkable.Neurosurgery consulted not a surgical candidate. Maintained in an Aspen cervical collarwhen out of bed. Currently on dysphagia #2 thin liquid diet. Physical and occupational therapy evaluations completed with recommendations of physical medicine rehabilitation consult. Patient was admitted for a comprehensive rehabilitation program.  SLP  evaluation completed on 08/15/2017 with the following results:  Pt presents with what appears to be baseline dysphagia and can be attributed to absent dentition and dyskinesia.  Pt's oral phase is atypical and results in pocketing of solids which pt had to orally expectorate; however, suspect that with increased time pt would have been able to swallow solids and that pt likely had some extent of pocketing at baseline.  Pt reports it took him a "long time" to eat prior to admission.  Pt reports that he ate "anything" at his group home; as a result, will advance pt's diet to regular textures and thin liquids to allow pt greater variety in meal selection.  SLP will follow up for 1-2 additional sessions to assess for toleration prior to discharge from [place].     Skilled Therapeutic Interventions          Bedside swallow evaluation completed with results and recommendations reviewed with patient.  Pt needed min assist verbal cues to monitor and correct pocketing of solids.  No overt s/s of aspiration were evident with solids or liquids.  Pt left in bed with bed alarm set and call bell within reach.     SLP Assessment  Patient will need skilled Speech Lanaguage Pathology Services during CIR admission    Recommendations  SLP Diet Recommendations: Age appropriate regular solids;Thin Liquid Administration via: Cup;Straw Medication Administration: Whole meds with liquid Supervision: Full supervision/cueing for compensatory strategies;Trained caregiver to feed patient Compensations: Slow rate;Small sips/bites;Follow solids with liquid;Lingual sweep for clearance of pocketing Postural Changes and/or Swallow Maneuvers: Seated upright 90 degrees;Upright 30-60 min after meal Oral Care Recommendations: Oral care BID Patient  destination: [place] (SNF) Follow up Recommendations: None Equipment Recommended: None recommended by SLP    SLP Frequency 1 to 3 out of 7 days   SLP Duration  SLP  Intensity  SLP Treatment/Interventions 1-2 additional sessions   Minumum of 1-2 x/day, 30 to 90 minutes  Dysphagia/aspiration precaution training    Pain Pain Assessment Pain Assessment: No/denies pain  Prior Functioning    Function:  Eating Eating   Modified Consistency Diet: Yes Eating Assist Level: Helper feeds patient;Helper checks for pocketed food           Cognition Comprehension Comprehension assist level: Follows basic conversation/direction with extra time/assistive device  Expression   Expression assist level: Expresses basic 90% of the time/requires cueing < 10% of the time.  Social Interaction Social Interaction assist level: Interacts appropriately 90% of the time - Needs monitoring or encouragement for participation or interaction.  Problem Solving Problem solving assist level: Solves basic 25 - 49% of the time - needs direction more than half the time to initiate, plan or complete simple activities  Memory Memory assist level: Recognizes or recalls 50 - 74% of the time/requires cueing 25 - 49% of the time   Short Term Goals: Week 1: SLP Short Term Goal 1 (Week 1): STG=LTG due to ELOS for ST follow up   Refer to Care Plan for Long Term Goals  Recommendations for other services: None   Discharge Criteria: Patient will be discharged from SLP if patient refuses treatment 3 consecutive times without medical reason, if treatment goals not met, if there is a change in medical status, if patient makes no progress towards goals or if patient is discharged from hospital.  The above assessment, treatment plan, treatment alternatives and goals were discussed and mutually agreed upon: by patient  Emilio Math 08/18/2017, 12:30 PM

## 2017-08-18 NOTE — Evaluation (Signed)
Physical Therapy Assessment and Plan  Patient Details  Name: Dean Oliver MRN: [id number] Date of Birth: 09-14-1946  PT Diagnosis: Abnormal posture, Cognitive deficits, Coordination disorder, Difficulty walking, Impaired cognition, Impaired sensation, Muscle spasms, Muscle weakness, Paralysis and Quadriplegia Rehab Potential: Fair ELOS: 26-28 days   Today's Date: 08/18/2017 PT Individual Time: 1000-1100 PT Individual Time Calculation (min): 60 min    Problem List:  Patient Active Problem List   Diagnosis Date Noted  . Neurogenic bladder 08/18/2017  . Neurogenic bowel 08/18/2017  . Bacterial UTI 08/18/2017  . Central cord syndrome ([place]) 08/17/2017  . Dysphagia   . Fall   . Hypotension   . Sinus bradycardia   . HNP (herniated nucleus pulposus) with myelopathy, cervical   . Benign essential HTN   . Hyponatremia   . Acute blood loss anemia   . Symptomatic bradycardia 08/12/2017  . Schizophrenia [facility])     Past Medical History:  Past Medical History:  Diagnosis Date  . Cataracts, bilateral   . HTN (hypertension)   . Schizophrenia [facility])    Past Surgical History:  Past Surgical History:  Procedure Laterality Date  . None      Assessment & Plan Clinical Impression: [name] a 72 y.o.right handed malewith history of hypertension, schizophrenia. Per chart review patient resides in a group home with baseline cognitive impairments. Presented 08/12/2017 after a fall with complaints of weakness in the arms and legs. CT of the head showed artifact versus subcentimeter infarct in the left pons. MRI cervical spine showed spinal cord edema at C2-3 as well as moderate spinal stenosis/central cord syndrome.Limited MRI brain reviewed, relatively unremarkable.Neurosurgery consulted not a surgical candidate. Maintained in an Aspen cervical collarwhen out of bed. Bouts of hypotension as well as bradycardia required Levophed as well as IV hydration. Cardiology service was  consulted. Troponin negative. Echocardiogram with ejection fraction of 60% no wall motion abnormalities. Bradycardia felt to be chronic no AV block noted. Cardiology services signed off 08/15/2017 no further recommendations. Subcutaneous heparin for DVT prophylaxis. Currently on dysphagia #2 thin liquid diet. Physical and occupational therapy evaluations completed with recommendations of physical medicine rehabilitation consult. Patient was admitted for a comprehensive rehabilitation program. Patient transferred to CIR on 08/17/2017 .   Patient currently requires total with mobility secondary to muscle weakness, muscle joint tightness and muscle paralysis, decreased cardiorespiratoy endurance, impaired timing and sequencing, abnormal tone, unbalanced muscle activation and decreased coordination, decreased initiation, decreased attention and delayed processing and decreased sitting balance, decreased standing balance, decreased postural control, decreased balance strategies and quadriparesis.  Prior to hospitalization, patient was modified independent  with mobility and lived with Other (Comment)(group home) in a [place] home.  Home access is   .  Patient will benefit from skilled PT intervention to maximize safe functional mobility, minimize fall risk and decrease caregiver burden for planned discharge to SNF.  Anticipate patient will benefit from PT at SNF at discharge.  PT - End of Session Activity Tolerance: Tolerates 10 - 20 min activity with multiple rests Endurance Deficit: Yes Endurance Deficit Description: fatigues quickly with mobility activities PT Assessment Rehab Potential (ACUTE/IP ONLY): Fair PT Barriers to Discharge: Decreased caregiver support;Neurogenic Bowel & Bladder;Lack of/limited family support PT Patient demonstrates impairments in the following area(s): Balance;Sensory;Behavior;Endurance;Motor;Pain;Perception;Safety;Skin Integrity PT Transfers Functional Problem(s): Bed  Mobility;Bed to Chair PT Plan PT Intensity: Minimum of 1-2 x/day ,45 to 90 minutes PT Frequency: 5 out of 7 days PT Duration Estimated Length of Stay: 26-28 days PT Treatment/Interventions:  Ambulation/gait training;Discharge planning;Functional mobility training;Therapeutic Activities;Wheelchair propulsion/positioning;Skin care/wound management;Therapeutic Exercise;Neuromuscular re-education;Disease management/prevention;Balance/vestibular training;DME/adaptive equipment instruction;Cognitive remediation/compensation;Pain management;Splinting/orthotics;UE/LE Strength taining/ROM;UE/LE Coordination activities;Patient/family education PT Transfers Anticipated Outcome(s): modA PT Locomotion Anticipated Outcome(s): dependent in w/c PT Recommendation Follow Up Recommendations: Skilled nursing facility Patient destination: [place] (SNF) Equipment Recommended: To be determined  Skilled Therapeutic Intervention Pt received seated in bed, c/o pain in neck but does not rate; agreeable to treatment. PT initial evaluation performed and completed with assist as described below; pt limited by quadriparesis and pain, limited trunk control. Educated pt in rehab process, goals, estimated LOS. Returned to bed at end of session; remained supine with all needs in reach at completion of session.   PT Evaluation Precautions/Restrictions Precautions Precautions: Fall;Cervical Required Braces or Orthoses: Cervical Brace Cervical Brace: Hard collar;Other (comment)(okay to don in sitting) Restrictions Weight Bearing Restrictions: No General Chart Reviewed: Yes Response to Previous Treatment: Patient reporting fatigue but able to participate. Family/Caregiver Present: No  Pain  c/o pain in neck, does not rate Home Living/Prior Functioning Home Living Available Help at Discharge: Available 24 hours/day Type of Home: Group Home  Lives With: Other (Comment)(group home) Vision/Perception   Vision - Assessment Alignment/Gaze Preference: Head turned(head turned to the right)  Cognition Overall Cognitive Status: History of cognitive impairments - at baseline Sensation Sensation Light Touch: Impaired Detail Light Touch Impaired Details: Impaired LUE;Impaired RUE Stereognosis: Not tested Hot/Cold: Not tested Proprioception: Impaired Detail Proprioception Impaired Details: Impaired RUE;Impaired LUE Coordination Gross Motor Movements are Fluid and Coordinated: No Fine Motor Movements are Fluid and Coordinated: No Coordination and Movement Description: significantly impaired Finger Nose Finger Test: unable to complete Motor  Motor Motor: Tetraplegia;Abnormal tone;Abnormal postural alignment and control  Mobility Bed Mobility Bed Mobility: Rolling Right;Rolling Left Rolling Right: 1: +1 Total assist Rolling Right Details: Verbal cues for technique;Tactile cues for weight shifting;Tactile cues for placement Rolling Left: 1: +1 Total assist Rolling Left Details: Tactile cues for placement;Tactile cues for weight shifting;Verbal cues for precautions/safety;Verbal cues for technique Left Sidelying to Sit: 1: +1 Total assist;1: +2 Total assist Left Sidelying to Sit: Patient Percentage: 0% Left Sidelying to Sit Details: Manual facilitation for weight shifting;Tactile cues for weight shifting;Tactile cues for sequencing;Tactile cues for placement;Tactile cues for posture Transfers Transfers: Yes Sit to Stand: 1: +2 Total assist("three muskateers") Sit to Stand Details: Tactile cues for sequencing;Tactile cues for weight shifting;Tactile cues for posture;Tactile cues for initiation Lateral/Scoot Transfers: 2: Max assist;1: +2 Total assist;With armrests removed;With slide board;From elevated surface Lateral/Scoot Transfer Details: Verbal cues for technique;Verbal cues for precautions/safety;Tactile cues for weight shifting;Tactile cues for placement;Tactile cues for  sequencing;Tactile cues for initiation;Manual facilitation for weight shifting Locomotion  Ambulation Ambulation: No Gait Gait: No Stairs / Additional Locomotion Stairs: No Wheelchair Mobility Wheelchair Mobility: No  Trunk/Postural Assessment  Cervical Assessment Cervical Assessment: Within Functional Limits(neck rotated to the right in bed/ forward flexion ) Thoracic Assessment Thoracic Assessment: (forward flexion ) Lumbar Assessment Lumbar Assessment: (posterior pelvic tilt) Postural Control Postural Control: Deficits on evaluation Righting Reactions: absent  Balance Dynamic Sitting Balance Sitting balance - Comments: Pt sat EOB x 15 minutes primarily with min assist. Pt able to sit with min guard for 20-30 seconds several times. Donned shirt with min A for balance. Extremity Assessment  RUE Assessment RUE Assessment: Exceptions to [facility] RUE AROM (degrees) Overall AROM Right Upper Extremity: Within functional limits for tasks performed RUE Strength RUE Overall Strength Comments: trace movement with shoulder elevation and in tricep extension RUE Tone RUE Tone:  Modified Ashworth Modified Ashworth Scale for Grading Hypertonia RUE: More marked increase in muscle tone through most of the ROM, but affected part(s) easily moved LUE Assessment LUE Assessment: Exceptions to [facility] LUE PROM (degrees) Overall PROM Left Upper Extremity: Within functional limits for tasks assessed LUE Strength LUE Overall Strength Comments: Trace movement in shoulder elevation LUE Tone LUE Tone: Modified Ashworth Modified Ashworth Scale for Grading Hypertonia LUE: More marked increase in muscle tone through most of the ROM, but affected part(s) easily moved RLE Assessment RLE Assessment: Exceptions to [facility] RLE Strength RLE Overall Strength: Deficits Right Hip Flexion: 1/5 Right Hip Extension: 2/5 Right Knee Extension: 2/5 Right Ankle Dorsiflexion: 0/5 Right Ankle Plantar Flexion: 2/5 RLE Tone RLE  Tone: Moderate;Hypertonic RLE Tone Comments: extensor tone LLE Assessment LLE Assessment: Exceptions to [facility] LLE Strength LLE Overall Strength: Deficits Left Hip Flexion: 0/5 Left Hip Extension: 2/5 Left Knee Extension: 2/5 Left Ankle Dorsiflexion: 0/5 Left Ankle Plantar Flexion: 2/5 LLE Tone LLE Tone: Moderate;Hypertonic LLE Tone Comments: extensor tone   See Function Navigator for Current Functional Status.   Refer to Care Plan for Long Term Goals  Recommendations for other services: None   Discharge Criteria: Patient will be discharged from PT if patient refuses treatment 3 consecutive times without medical reason, if treatment goals not met, if there is a change in medical status, if patient makes no progress towards goals or if patient is discharged from hospital.  The above assessment, treatment plan, treatment alternatives and goals were discussed and mutually agreed upon: by patient  Luberta Mutter 08/18/2017, 12:03 PM

## 2017-08-18 NOTE — Progress Notes (Signed)
Foley has been clipped off for 2.5 hours. Will scan bladder.

## 2017-08-18 NOTE — Progress Notes (Signed)
Occupational Therapy Note  Patient Details  Name: Dean Oliver MRN: [id number] Date of Birth: [birth year]  Today's Date: 08/18/2017 OT Missed Time: 74 Minutes Missed Time Reason: Patient fatigue;Patient unwilling/refused to participate without medical reason  Upon entering the room, RN and NT exiting and recently removed foley catheter. Pt refused OT intervention secondary to feeling unwell and states, " I had therapy this morning." OT provided education regarding therapy ongoing for scheduled time and pt continues to refuse OT intervention at this time. Bed alarm activated and call bell within reach upon exiting the room.    Gypsy Decant 08/18/2017, 3:36 PM

## 2017-08-18 NOTE — Progress Notes (Signed)
[facility]     PROGRESS NOTE    Subjective/Complaints: Pt alert in bed. Complaining about foley. Wants out. Causing some irritation  ROS: Limited due to cognitive/behavioral   Objective: Vital Signs: Blood pressure 138/76, pulse 72, temperature 99.8 F (37.7 C), temperature source Oral, resp. rate 18, SpO2 95 %. No results found. Recent Labs    08/17/17 1640  WBC 5.7  HGB 10.3*  HCT 31.5*  PLT 173   Recent Labs    08/16/17 0817 08/17/17 1640  NA 133*  --   K 4.4  --   CL 105  --   GLUCOSE 108*  --   BUN 8  --   CREATININE 0.60* 0.60*  CALCIUM 8.1*  --    CBG (last 3)  No results for input(s): GLUCAP in the last 72 hours.  Wt Readings from Last 3 Encounters:  No data found for Wt    Physical Exam:  Head: Normocephalic and atraumatic.  Eyes: Right eye exhibits no discharge. Left eye exhibits no discharge. No scleral icterus.  Neck: Normal range of motion. Neck supple. No thyromegaly present.  Oral: edentulous Cardiovascular: brady, no murmur Respiratory: CTA Bilaterally without wheezes or rales. Normal effort  GI: Soft. Bowel sounds are normal. He exhibits no distension.  Musculoskeletal: He exhibits no edema or tenderness.  Neurological: He is alert.  Patient provides his name and age.  Severely dysarthric A&Ox3 Right lean Motor: B/l UE 0/5 proximal to distal B/l LE: HF 2 to 2+/5, KE 2+/5, distally 0/5  Skin: Skin is hot, a few chronic changes Uro: foley in place, urine cloudy Psychiatric: His behavior is normal. His affect is blunt. anxious    Assessment/Plan: 1. 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 Physiatrist is providing close team supervision and 24 hour management of active medical problems listed below. Physiatrist and rehab team continue to assess barriers to  discharge/monitor patient progress toward functional and medical goals.  Function:  Bathing Bathing position      Bathing parts      Bathing assist        Upper Body Dressing/Undressing Upper body dressing                    Upper body assist        Lower Body Dressing/Undressing Lower body dressing                                  Lower body assist        Toileting Toileting          Toileting assist     Transfers Chair/bed transfer             Locomotion Ambulation           Wheelchair          Cognition Comprehension Comprehension assist level: Understands basic 75 - 89% of the time/ requires cueing 10 - 24% of the time  Expression Expression assist level: Expresses basic 75 - 89% of the time/requires cueing 10 - 24% of the time. Needs helper to occlude trach/needs to repeat words.  Social Interaction Social Interaction assist level: Interacts appropriately 75 - 89% of the time - Needs redirection for appropriate language or to initiate interaction.  Problem Solving Problem solving assist  level: Solves basic 25 - 49% of the time - needs direction more than half the time to initiate, plan or complete simple activities  Memory     Medical Problem List and Plan: 1.  Decreased functional mobility/quadriparesis secondary to central cord injury/myelopathy with herniation   -beginning therapies today 2.  DVT Prophylaxis/Anticoagulation: Subcutaneous heparin. Monitor for any bleeding episodes. Pending vascular study 3. Pain Management: Hydrocodone as needed, Zanaflex 4 mg every 6 hours as needed 4. Mood/schizophrenia: Xanax 0.25 mg twice daily as needed 5. Neuropsych: This patient is capable of making decisions on his own behalf. 6. Skin/Wound Care: Routine skin checks 7. Fluids/Electrolytes/Nutrition: encourage PO    -I personally reviewed the patient's labs today.   8. Hypotension with bradycardia with history of HTN. Bradycardia felt  to be chronic. Follow-up cardiology services no further planned workup. Echocardiogram unremarkable. Troponin negative. Monitor with increased mobility 9. Hyponatremia. Follow-up chemistries 10. Dysphagia. Dysphagia #2 thin liquids. Follow-up speech therapy 11. Low grade temp: urine suspicious. Also complains of foley irritation    -check ua/ucx.   -remove foley.      LOS (Days) 1 A [place] T, MD 08/18/2017 8:15 AM

## 2017-08-18 NOTE — Progress Notes (Signed)
Physical Medicine and Rehabilitation Consult Reason for Consult: Decreased functional mobility Referring Physician: [facility]   HPI: Dean Oliver is a 71 y.o. right handed male with history of hypertension, schizophrenia. Per chart review patient resides in a group home with baseline cognitive impairments. Presented 08/12/2017 after a fall with complaints of weakness in the arms. CT of the head showed artifact versus subcentimeter infarct in the left pons. MRI cervical spine showed spinal cord edema at C2-3 as well as moderate spinal stenosis/central cord syndrome. Limited MRI brain reviewed, relatively unremarkable. Neurosurgery consulted not a surgical candidate. Maintained in an Aspen cervical collar when out of bed. Bouts of hypotension as well as bradycardia required Levophed as well as IV hydration. Cardiology service is consulted. Echocardiogram with ejection fraction of 60% no wall motion abnormalities. Bradycardia felt to be chronic no AV block noted. Cardiology services signed off 08/15/2017 no further recommendations. Subcutaneous heparin for DVT prophylaxis. Currently on dysphagia #2 thin liquid diet. Physical therapy evaluation completed with recommendations of physical medicine rehabilitation consult.  Review of Systems  Constitutional: Negative for chills and fever.  HENT: Negative for hearing loss.   Eyes: Negative for blurred vision and double vision.  Respiratory: Negative for cough and shortness of breath.   Cardiovascular: Positive for leg swelling. Negative for chest pain and palpitations.  Genitourinary: Positive for urgency. Negative for dysuria and hematuria.  Musculoskeletal: Positive for myalgias.  Skin: Negative for rash.  Neurological: Positive for sensory change and focal weakness. Negative for seizures.  Psychiatric/Behavioral:       Schizophrenia  All other systems reviewed and are negative.      Past Medical History:  Diagnosis Date  . Cataracts, bilateral     . HTN (hypertension)   . Schizophrenia [facility])         Past Surgical History:  Procedure Laterality Date  . None     No pertinent family history of trauma. Social History:  Denies tobacco, alcohol, and illicit drug history. Allergies: No Known Allergies       Medications Prior to Admission  Medication Sig Dispense Refill  . acetaminophen (TYLENOL) 325 MG tablet Take 650 mg by mouth every 6 (six) hours as needed for headache (pain).    Marland Kitchen acetaZOLAMIDE (DIAMOX) 250 MG tablet Take 500 mg by mouth daily.    . benztropine (COGENTIN) 1 MG tablet Take 1 mg by mouth daily.    Marland Kitchen docusate sodium (COLACE) 100 MG capsule Take 200 mg by mouth at bedtime.    . dorzolamide-timolol (COSOPT) 22.3-6.8 MG/ML ophthalmic solution Place 1 drop into both eyes daily.    Marland Kitchen doxazosin (CARDURA) 2 MG tablet Take 1 mg by mouth daily.    Marland Kitchen latanoprost (XALATAN) 0.005 % ophthalmic solution Place 1 drop into both eyes at bedtime.    Marland Kitchen lisinopril-hydrochlorothiazide (PRINZIDE,ZESTORETIC) 20-12.5 MG tablet Take 1 tablet by mouth daily.    . Multiple Vitamins-Minerals (CERTAVITE SENIOR/ANTIOXIDANT) TABS Take 1 tablet by mouth daily.    . risperidone (RISPERDAL) 4 MG tablet Take 4 mg by mouth 2 (two) times daily.    . Tafluprost (ZIOPTAN) 0.0015 % SOLN Place 1 drop into both eyes daily.      Home: Home Living Family/patient expects to be discharged to:: Group home Additional Comments: Pt with decreased ability to report; reports from group home, does not work  Functional History: Prior Function Level of Independence: Needs assistance Gait / Transfers Assistance Needed: Independent with ambulation ADL's / Homemaking Assistance Needed: Independent for bathing and dressing;  sounds like group home provides meals Functional Status:  Mobility: Bed Mobility Overal bed mobility: Needs Assistance Bed Mobility: Rolling, Supine to Sit, Sit to Supine Rolling: +2 for physical assistance, Max  assist Supine to sit: Total assist, +2 for physical assistance Sit to supine: Total assist, +2 for physical assistance General bed mobility comments: MaxA to roll as pt able to assist with some hip flexion; totalA for sit<>supine Transfers Overall transfer level: Needs assistance Equipment used: None Transfers: Sit to/from Stand Sit to Stand: Total assist, +2 physical assistance General transfer comment: Unable to achieve fully upright standing with totalA (+2); pt able to initiate some hip extension (grossly 2/5)  ADL: ADL Overall ADL's : Needs assistance/impaired General ADL Comments: Total assistance at this time.   Cognition: Cognition Overall Cognitive Status: History of cognitive impairments - at baseline Orientation Level: Oriented X4 Cognition Arousal/Alertness: Awake/alert Behavior During Therapy: WFL for tasks assessed/performed Overall Cognitive Status: History of cognitive impairments - at baseline General Comments: Appropriately interactive with PT/OT. Able to answer questions regarding PLOF, but unsure of accuracy. Able to follow one step commands with intermittent cues. Increased difficulty with sensation testing, simply stating "feels good" when asked about BUE/LE sensation and asked to elaborate. Poor ability to follow commands with visual tracking and initially difficulty reporting location of therapist's touch mixing up upper and lower extremities but corrected with minimal cues.   Blood pressure (!) 145/72, pulse (!) 52, temperature 98.6 F (37 C), temperature source Oral, resp. rate 14, SpO2 98 %. Physical Exam  Vitals reviewed. Constitutional: He appears well-developed.  Frail  HENT:  Head: Normocephalic and atraumatic.  Eyes: Right eye exhibits no discharge. Left eye exhibits no discharge.  Right gaze preference with limited movement to left  Cardiovascular: Normal rate and regular rhythm.  Respiratory: Effort normal and breath sounds normal. No  respiratory distress.  GI: Soft. Bowel sounds are normal. He exhibits no distension.  Musculoskeletal: He exhibits no edema or tenderness.  Neurological: He is alert.  Patient provides his name and age.  Limited medical historian A&Ox3 Right lean Motor: B/l UE 0/5 proximal to distal B/l LE: HF 2/5, distally 0/5   Skin: Skin is warm and dry.  Psychiatric: His speech is slurred. He is slowed.   Assessment/Plan: Diagnosis: Cervical myelopathy with disc herniation Labs and images independently reviewed.  Records reviewed and summated above.  1. Does the need for close, 24 hr/day medical supervision in concert with the patient's rehab needs make it unreasonable for this patient to be served in a less intensive setting? Yes  2. Co-Morbidities requiring supervision/potential complications: HTN (monitor and provide prns in accordance with increased physical exertion and pain), schizophrenia (monitor mood), hypotension (monitor with increased mobility), bradycardia (see previous), dysphagia (advance diet as tolerated), hyponatremia (cont to monitor, treat if necessary), ABLA (transfuse if necessary to ensure appropriate perfusion for increased activity tolerance) 3. Due to bladder management, bowel management, safety, skin/wound care, disease management, medication administration, pain management and patient education, does the patient require 24 hr/day rehab nursing? Yes 4. Does the patient require coordinated care of a physician, rehab nurse, PT (1-2 hrs/day, 5 days/week), OT (1-2 hrs/day, 5 days/week) and SLP (1-2 hrs/day, 5 days/week) to address physical and functional deficits in the context of the above medical diagnosis(es)? Yes Addressing deficits in the following areas: balance, endurance, locomotion, strength, transferring, bowel/bladder control, bathing, dressing, feeding, grooming, toileting, cognition, speech, language, swallowing and psychosocial support 5. Can the patient actively  participate in an intensive therapy  program of at least 3 hrs of therapy per day at least 5 days per week? Potentially 6. The potential for patient to make measurable gains while on inpatient rehab is excellent and good 7. Anticipated functional outcomes upon discharge from inpatient rehab are mod assist and max assist  with PT, mod assist and max assist with OT, min assist and mod assist with SLP. 8. Estimated rehab length of stay to reach the above functional goals is: 30-35 days. 9. Anticipated D/C setting: SNF 10. Anticipated post D/C treatments: SNF 11. Overall Rehab/Functional Prognosis: good and fair  RECOMMENDATIONS: This patient's condition is appropriate for continued rehabilitative care in the following setting: CIR to decrease burden of care. Patient has agreed to participate in recommended program. Yes Note that insurance prior authorization may be required for reimbursement for recommended care.  Comment: Rehab Admissions Coordinator to follow up.  Delice Lesch, MD, ABPMR Lavon Paganini Angiulli, PA-C 08/16/2017          Revision History                        Routing History

## 2017-08-18 NOTE — Progress Notes (Signed)
PMR Admission Coordinator Pre-Admission Assessment  Patient: Dean Oliver is an 71 y.o., male MRN: [id number] DOB: [birth year] Height:   Weight:                                                                                                                                                    Insurance Information HMO:     PPO:      PCP:      IPA:      80/20:      OTHER:  PRIMARY: Medicaid       Policy#: [id number]      Subscriber: Self CM Name:       Phone#:      Fax#:  Pre-Cert#: Coverage Code: [id number]      Employer:  Benefits:  Phone #: [phone]     Name: Verified via an automated system  Eff. Date: Eligible as of 08/16/17     Deduct:       Out of Pocket Max:       Life Max:  CIR: Covered per Medicaid guidelines       SNF:  Outpatient:      Co-Pay:  Home Health:       Co-Pay:  DME:      Co-Pay:  Providers:   SECONDARY: Medicare B      Policy#: [id number]      Subscriber: Self CM Name:       Phone#:      Fax#:  Pre-Cert#:       Employer:  Benefits:  Phone #: Verified online     Name: [employer] One Eff. Date: 11/19/14     Deduct:       Out of Pocket Max:       Life Max:  CIR:       SNF:  Outpatient:      Co-Pay:  Home Health:       Co-Pay:  DME:      Co-Pay:   Medicaid Application Date:       Case Manager:  Disability Application Date:       Case Worker:   Emergency Contact Information        Contact Information    Name Relation Home Work Mobile   REYNOLDS,NATHANIEL  [phone]     [place], [place] [phone]  [phone]   Robley Fries Relative [phone]       Current Medical History  Patient Admitting Diagnosis: Cervical myelopathy with disc herniation  History of Present Illness: Dean Oliver [facility] - Inpatient a 71 y.o.right handed malewith history of hypertension, schizophrenia. Per chart review patient resides in a group home with baseline cognitive impairments. Presented 08/12/2017 after a fall with complaints of weakness in the  armsand legs. CT of the head showed artifact versus subcentimeter infarct in the left pons. MRI cervical spine  showed spinal cord edema at C2-3 as well as moderate spinal stenosis/central cord syndrome.Limited MRI brain reviewed, relatively unremarkable.Neurosurgery consulted not a surgical candidate. Maintained in an Aspen cervical collarwhen out of bed. Bouts of hypotension as well as bradycardia required Levophed as well as IV hydration. Cardiology servicewasconsulted.Troponin negative.Echocardiogram with ejection fraction of 60% no wall motion abnormalities. Bradycardia felt to be chronic no AV block noted. Cardiology services signed off 08/15/2017 no further recommendations. Subcutaneous heparin for DVT prophylaxis. Currently on dysphagia 2 textures and thin liquid diet. Physicaland occupationaltherapy evaluationscompleted with recommendations of physical medicine rehabilitation consult.Patient was admitted for a comprehensive rehabilitation program 08/17/17.  Past Medical History      Past Medical History:  Diagnosis Date  . Cataracts, bilateral   . HTN (hypertension)   . Schizophrenia ([place])    Family History  family history is not on file.  Prior Rehab/Hospitalizations:  Has the patient had major surgery during 100 days prior to admission? No  Current Medications   Current Facility-Administered Medications:  .  0.9 %  sodium chloride infusion, , Intravenous, Continuous, Josue Hector, MD, Last Rate: 100 mL/hr at 08/17/17 0725 .  acetaminophen (TYLENOL) tablet 650 mg, 650 mg, Oral, Q4H PRN, Fay Records, MD, 650 mg at 08/17/17 0806 .  ALPRAZolam (XANAX) tablet 0.25 mg, 0.25 mg, Oral, BID PRN, Fay Records, MD .  aspirin EC tablet 81 mg, 81 mg, Oral, Daily, Fay Records, MD, 81 mg at 08/17/17 0816 .  heparin injection 5,000 Units, 5,000 Units, Subcutaneous, Q8H, Fay Records, MD, 5,000 Units at 08/17/17 0446 .  hydrALAZINE (APRESOLINE) injection 10 mg, 10 mg,  Intravenous, Q6H PRN, Bodenheimer, Charles A, NP, 10 mg at 08/17/17 0446 .  HYDROcodone-acetaminophen (NORCO/VICODIN) 5-325 MG per tablet 1-2 tablet, 1-2 tablet, Oral, Q6H PRN, Eustace Moore, MD, 2 tablet at 08/17/17 0342 .  MUSCLE RUB CREA, , Topical, PRN, Josue Hector, MD .  nitroGLYCERIN (NITROSTAT) SL tablet 0.4 mg, 0.4 mg, Sublingual, Q5 Min x 3 PRN, Fay Records, MD .  ondansetron [facility]) injection 4 mg, 4 mg, Intravenous, Q6H PRN, Fay Records, MD .  potassium chloride SA (K-DUR,KLOR-CON) CR tablet 40 mEq, 40 mEq, Oral, BID, Josue Hector, MD, 40 mEq at 08/17/17 0815 .  tiZANidine (ZANAFLEX) tablet 4 mg, 4 mg, Oral, Q6H PRN, Eustace Moore, MD, 4 mg at 08/16/17 0916 .  zolpidem (AMBIEN) tablet 5 mg, 5 mg, Oral, QHS PRN, Fay Records, MD  Patients Current Diet: DIET DYS 2 Room service appropriate? Yes; Fluid consistency: Thin  Precautions / Restrictions Precautions Precautions: Fall, Cervical Cervical Brace: Hard collar, Other (comment)(OK to apply in sitting) Restrictions Weight Bearing Restrictions: No   Has the patient had 2 or more falls or a fall with injury in the past year?Yes  Prior Activity Level Community (5-7x/wk): Prior to admission patient lived in a group home, but was fully independent with basic self care tasks and enjoyed going on outings.  He has family near by that also visit and enjoys joining them for the holidays.      Home Assistive Devices / Equipment  Prior Device Use: Indicate devices/aids used by the patient prior to current illness, exacerbation or injury? None of the above  Prior Functional Level Prior Function Level of Independence: Needs assistance Gait / Transfers Assistance Needed: Independent with ambulation ADL's / Homemaking Assistance Needed: Independent for bathing and dressing; sounds like group home provides meals  Self Care: Did the patient  need help bathing, dressing, using the toilet or eating? Independent  Indoor  Mobility: Did the patient need assistance with walking from room to room (with or without device)? Independent  Stairs: Did the patient need assistance with internal or external stairs (with or without device)? Independent  Functional Cognition: Did the patient need help planning regular tasks such as shopping or remembering to take medications? Dependent  Current Functional Level Cognition  Overall Cognitive Status: History of cognitive impairments - at baseline Orientation Level: Oriented X4 General Comments: Interacting but focused on laying back down this session.     Extremity Assessment (includes Sensation/Coordination)  Upper Extremity Assessment: RUE deficits/detail, LUE deficits/detail RUE Deficits / Details: Significant trap tightness with limited GH and scapulothoracic movement with passive shoulder AROM in flexion and abduction (abduction more limited than flexion). Trace triceps activation and otherwise 0/5 strength. Sensation dull but pt able to discriminate between locations.  LUE Deficits / Details: Trace triceps strength but otherwise 0/5 strength. Dull light touch sensation but able to discriminate location being touched.   Lower Extremity Assessment: Defer to PT evaluation RLE Deficits / Details: R hip flex grossly 2/5, knee ext 3/5, knee flex 3/5, ankle DF/PF 0/5; able to localize light touch sensation, but reports it feels numb RLE Sensation: decreased light touch RLE Coordination: decreased fine motor, decreased gross motor LLE Deficits / Details: L hip flex grossly 2/5, knee ext 3/5, knee flex 3/5, ankle DF/PF 0/5; able to localize light touch sensation, but reports it feels numb LLE Sensation: decreased light touch LLE Coordination: decreased fine motor, decreased gross motor    ADLs  Overall ADL's : Needs assistance/impaired General ADL Comments: Total assistance at this time.     Mobility  Overal bed mobility: Needs Assistance Bed Mobility: Rolling,  Supine to Sit, Sit to Supine Rolling: +2 for physical assistance, Max assist Supine to sit: Total assist, +2 for physical assistance Sit to supine: Total assist, +2 for physical assistance General bed mobility comments: MaxA to roll as pt able to assist with some hip flexion; totalA for sit<>supine    Transfers  Overall transfer level: Needs assistance Equipment used: None Transfers: Sit to/from Stand Sit to Stand: Total assist, +2 physical assistance General transfer comment: Unable to achieve fully upright standing with totalA (+2); pt able to initiate some hip extension (grossly 2/5)    Ambulation / Gait / Stairs / Wheelchair Mobility       Posture / Balance Dynamic Sitting Balance Sitting balance - Comments: Pt able to maintain balance for ~10sec at a time with cues for anterior/lateral lean; other times requiring maxA for balance (easily fatigued) Balance Overall balance assessment: Needs assistance Sitting-balance support: No upper extremity supported, Feet supported Sitting balance-Leahy Scale: Fair Sitting balance - Comments: Pt able to maintain balance for ~10sec at a time with cues for anterior/lateral lean; other times requiring maxA for balance (easily fatigued) Postural control: Posterior lean, Left lateral lean Standing balance support: Bilateral upper extremity supported Standing balance-Leahy Scale: Zero    Special needs/care consideration BiPAP/CPAP: No CPM: No Continuous Drip IV: No Dialysis: No         Life Vest: No Oxygen: No Special Bed: No Trach Size: No Wound Vac (area): No       Skin: WDL                               Bowel mgmt: Incontinent, last BM 08/17/17 Bladder mgmt: Incontinent,  catheter due to spinal issues Diabetic mgmt: No     Previous [place] Name: [place] Additional Comments: Pt with decreased ability to report; reports from group home, does not work  Discharge Living Setting Plans for  Discharge Living Setting: Other (Comment)(Likely SNF after reducing burden of care)  Social/Family/Support Systems Patient Roles: Other (Comment)(Family member ) Contact Information: Garment/textile technologist, 2nd cousin: [name] Anticipated Caregiver: SNF after reducing [place] of care  Anticipated Caregiver's Contact Information: Pam [phone] cell:[phone] Ability/Limitations of Caregiver: I have called group home director to clarify level of assist they cae provide, but voicemail was full and I was unable to leave a message Discharge Plan Discussed with Primary Caregiver: Yes Is Caregiver In Agreement with Plan?: Yes Does Caregiver/Family have Issues with Lodging/Transportation while Pt is in Rehab?: No  Goals/Additional Needs Patient/Family Goal for Rehab: PT/OT: Mod-Max A; SLP: Mod I  Expected length of stay: ~30 days  Cultural Considerations: None Dietary Needs: Dys.2 textures and thin liquids  Equipment Needs: TBD Pt/Family Agrees to Admission and willing to participate: Yes Program Orientation Provided & Reviewed with Pt/Caregiver Including Roles  & Responsibilities: Yes Additional Information Needs: Clarify level of assist group home can provide with [name] [phone] Information Needs to be Provided By: CSW  Barriers to Discharge: Insurance for SNF coverage, Neurogenic Bowel & Bladder(SNF after reducing the burden of care )  Decrease burden of Care through IP rehab admission: Decrease number of caregivers and Bowel and bladder program  Possible need for SNF placement upon discharge: Yes, and family would prefer a SNF in [place]; however, aware that if no beds are available or not accepting patient's they will need to expand the search.  Patient Condition: This patient's medical and functional status has changed since the consult dated 08/16/17 in which the Rehabilitation Physician determined and documented that the patient was potentially appropriate for intensive  rehabilitative care in an inpatient rehabilitation facility. Issues have been addressed and update has been discussed with Dr. Posey Pronto and patient now appropriate for inpatient rehabilitation. Will admit to inpatient rehab today.   Preadmission Screen Completed By:  Gunnar Fusi, 08/17/2017 1:47 PM ______________________________________________________________________   Discussed status with Dr. Posey Pronto on 08/17/17 at 1405 and received telephone approval for admission today.  Admission Coordinator:  Gunnar Fusi, time 1405/Date 08/17/17             Cosigned by: Jamse Arn, MD at 08/17/2017 2:14 PM  Revision History

## 2017-08-18 NOTE — Evaluation (Signed)
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 Neurogenic bladder 08/18/2017  . Neurogenic bowel 08/18/2017  . Bacterial UTI 08/18/2017  . Central cord syndrome ([place]) 08/17/2017  . Dysphagia   . Fall   . Hypotension   . Sinus bradycardia   . HNP (herniated nucleus pulposus) with myelopathy, cervical   . Benign essential HTN   . Hyponatremia   . Acute blood loss anemia   . Symptomatic bradycardia 08/12/2017  . Schizophrenia [facility])     Past Medical History:  Past Medical History:  Diagnosis Date  . Cataracts, bilateral   . HTN (hypertension)   . Schizophrenia [facility])    Past Surgical History:  Past Surgical History:  Procedure Laterality Date  . None      Assessment & Plan Clinical Impression: Patient is a 71 y.o. year old right handed malewith history of hypertension, schizophrenia. Per chart review patient resides in a group home with baseline cognitive impairments. Presented 08/12/2017 after a fall with complaints of weakness in the arms and legs. CT of the head showed artifact versus subcentimeter infarct in the left pons. MRI cervical spine showed spinal cord edema at C2-3 as well as moderate spinal stenosis/central cord syndrome.Limited MRI brain reviewed, relatively unremarkable.Neurosurgery consulted not a surgical candidate. Maintained in an Aspen cervical collarwhen out of bed. Bouts of hypotension as well as bradycardia required Levophed as well as IV hydration. Cardiology service was consulted. Troponin negative. Echocardiogram with ejection fraction of 60% no wall  motion abnormalities. Bradycardia felt to be chronic no AV block noted. Cardiology services signed off 08/15/2017 no further recommendations. Subcutaneous heparin for DVT prophylaxis. Currently on dysphagia #2 thin liquid diet.  Patient transferred to CIR on 08/17/2017 .    Patient currently requires total with basic self-care skills and basic mobility  secondary to muscle weakness, decreased cardiorespiratoy endurance, impaired timing and sequencing, abnormal tone, unbalanced muscle activation, decreased coordination and decreased motor planning, decreased problem solving and decreased safety awareness and decreased sitting balance, decreased standing balance, decreased postural control, decreased balance strategies and difficulty maintaining precautions .  Prior to hospitalization, patient could complete ADL with supervision.  Patient will benefit from skilled intervention to decrease level of assist with basic self-care skills and increase independence with basic self-care skills prior to discharge home with care partner.  Anticipate patient will require moderate physical assestance and f/u TBD based on d/c plan.  OT - End of Session Activity Tolerance: Tolerates 10 - 20 min activity with multiple rests OT Assessment Rehab Potential (ACUTE ONLY): Fair OT Barriers to Discharge: Decreased caregiver support OT Barriers to Discharge Comments: lives in group home OT Patient demonstrates impairments in the following area(s): Behavior;Balance;Edema;Endurance;Cognition;Motor;Skin Integrity;Safety;Sensory OT Basic ADL's Functional Problem(s): Grooming;Bathing;Dressing;Toileting;Eating OT Transfers Functional Problem(s): Toilet;Tub/Shower OT Additional Impairment(s): Fuctional Use of Upper Extremity OT Plan OT Intensity: Minimum of 1-2 x/day, 45 to 90 minutes OT Frequency: 5 out of 7 days OT Duration/Estimated Length of Stay: ~25-27 days OT Treatment/Interventions: Balance/vestibular  training;Discharge planning;Functional electrical stimulation;Pain management;Self Care/advanced ADL retraining;Therapeutic Activities;UE/LE Coordination activities;Cognitive remediation/compensation;Disease mangement/prevention;Functional mobility training;Patient/family education;Skin care/wound managment;Therapeutic Exercise;Community reintegration;DME/adaptive equipment instruction;Neuromuscular re-education;Psychosocial  support;Splinting/orthotics;UE/LE Strength taining/ROM;Wheelchair propulsion/positioning OT Self Feeding Anticipated Outcome(s): mod A OT Basic Self-Care Anticipated Outcome(s): mod A  OT Toileting Anticipated Outcome(s): max A OT Bathroom Transfers Anticipated Outcome(s): min A  OT Recommendation Patient destination: [place] (SNF) Follow Up Recommendations: Skilled nursing facility Equipment Recommended: To be determined   Skilled Therapeutic Intervention OT eval initiated with OT goals, purpose and role discussed with pt. SElf care training at bed level for bathing and dressing. Pt declined out of bed this afternoon due to fatigue and "not feeling well" but unable to fully describe. Pt required max A +2 for rolling to the right and left in the bed for clothing adjustments. Pt able to assist with lifting his LEs right>left when threading into pants. Pt came to EOB with max - total A. Pt able to maintain sitting balance with min guard to min A. Total a for donned shirt with trunk and neck flexion. Pt does not want to wear the c- collar in sitting but agreed to it in a short time. Pt returned to supine with total A+2. Shaved pt with total A in supine. Pt with trace movement in bilateral shoulder elevation and trace in right triceps. Left resting in bed. .   OT Evaluation Precautions/Restrictions  Precautions Precautions: Fall;Cervical Required Braces or Orthoses: Cervical Brace Cervical Brace: Hard collar;Other (comment)(okay to don in  sitting) Restrictions Weight Bearing Restrictions: No General Chart Reviewed: Yes Family/Caregiver Present: No Vital Signs Therapy Vitals Temp: (!) 100.6 F (38.1 C)(RN Notified) Temp Source: Oral Pulse Rate: 63 Resp: 18 BP: 134/67 Patient Position (if appropriate): Lying Oxygen Therapy SpO2: 98 % O2 Device: Not Delivered Pain Pain Assessment Pain Assessment: No/denies pain Home Living/Prior Functioning Home Living Available Help at Discharge: Available 24 hours/day Type of Home: Group Home  Lives With: Other (Comment)(group home) ADL ADL ADL Comments: see functional navigator Vision Baseline Vision/History: Cataracts Patient Visual Report: No change from baseline Alignment/Gaze Preference: Head turned(head turned to the right) Perception    impacted by sensation  Praxis   difficult to fully assess Cognition Overall Cognitive Status: History of cognitive impairments - at baseline Arousal/Alertness: Awake/alert Orientation Level: Person;Other(comment)(declined to answer questions) Memory: Impaired Immediate Memory Recall: (0/3) Memory Recall: (0/3) Awareness: Impaired Awareness Impairment: Intellectual impairment Problem Solving: Impaired Problem Solving Impairment: Functional basic Executive Function: (all impaired) Behaviors: Poor frustration tolerance Safety/Judgment: Impaired Sensation Sensation Light Touch: Impaired Detail Light Touch Impaired Details: Impaired LUE;Impaired RUE Stereognosis: Not tested Hot/Cold: Not tested Proprioception: Impaired Detail Proprioception Impaired Details: Impaired RUE;Impaired LUE Coordination Gross Motor Movements are Fluid and Coordinated: No Fine Motor Movements are Fluid and Coordinated: No Coordination and Movement Description: significantly impaired Finger Nose Finger Test: unable to complete Motor  Motor Motor: Tetraplegia;Abnormal tone;Abnormal postural alignment and control Mobility  Bed Mobility Bed  Mobility: Rolling Right;Rolling Left;Left Sidelying to Sit Rolling Right: 2: Max assist Rolling Left: 2: Max assist Left Sidelying to Sit: 1: +1 Total assist  Trunk/Postural Assessment  Cervical Assessment Cervical Assessment: Within Functional Limits(neck rotated to the right in bed/ forward flexion ) Thoracic Assessment Thoracic Assessment: (forward flexion ) Lumbar Assessment Lumbar Assessment: (posterior pelvic tilt) Postural Control Postural Control: Deficits on evaluation Righting Reactions: absent  Balance Dynamic Sitting Balance Sitting balance - Comments: Pt sat EOB x 15 minutes primarily with min assist. Pt able to sit with min guard for 20-30 seconds several times. Donned shirt with min A for balance. Extremity/Trunk Assessment RUE Assessment RUE Assessment: Exceptions to [facility] RUE AROM (degrees) Overall  AROM Right Upper Extremity: Within functional limits for tasks performed RUE Strength RUE Overall Strength Comments: trace movement with shoulder elevation and in tricep extension RUE Tone RUE Tone: Modified Ashworth Modified Ashworth Scale for Grading Hypertonia RUE: More marked increase in muscle tone through most of the ROM, but affected part(s) easily moved LUE Assessment LUE Assessment: Exceptions to [facility] LUE PROM (degrees) Overall PROM Left Upper Extremity: Within functional limits for tasks assessed LUE Strength LUE Overall Strength Comments: Trace movement in shoulder elevation LUE Tone LUE Tone: Modified Ashworth Modified Ashworth Scale for Grading Hypertonia LUE: More marked increase in muscle tone through most of the ROM, but affected part(s) easily moved   See Function Navigator for Current Functional Status.   Refer to Care Plan for Long Term Goals  Recommendations for other services: Neuropsych   Discharge Criteria: Patient will be discharged from OT if patient refuses treatment 3 consecutive times without medical reason, if treatment goals not met,  if there is a change in medical status, if patient makes no progress towards goals or if patient is discharged from hospital.  The above assessment, treatment plan, treatment alternatives and goals were discussed and mutually agreed upon: by patient  Nicoletta Ba 08/18/2017, 3:15 PM

## 2017-08-18 NOTE — Progress Notes (Signed)
Patient with low grade fever of 100.6 and RN reported cloudy, odorous urine. UA showed evidence of UTI. Will start patient on cipro empirically.

## 2017-08-19 ENCOUNTER — Encounter (HOSPITAL_COMMUNITY): Payer: Medicare Other

## 2017-08-19 ENCOUNTER — Inpatient Hospital Stay (HOSPITAL_COMMUNITY): Payer: Medicare Other | Admitting: Physical Therapy

## 2017-08-19 ENCOUNTER — Ambulatory Visit (HOSPITAL_COMMUNITY): Payer: Medicare Other | Admitting: Speech Pathology

## 2017-08-19 ENCOUNTER — Inpatient Hospital Stay (HOSPITAL_COMMUNITY): Payer: Medicare Other

## 2017-08-19 LAB — BASIC METABOLIC PANEL
ANION GAP: 4 — AB (ref 5–15)
BUN: 21 mg/dL — ABNORMAL HIGH (ref 6–20)
CALCIUM: 8.2 mg/dL — AB (ref 8.9–10.3)
CHLORIDE: 100 mmol/L — AB (ref 101–111)
CO2: 26 mmol/L (ref 22–32)
Creatinine, Ser: 0.71 mg/dL (ref 0.61–1.24)
GFR calc Af Amer: >60 mL/min (ref >60)
GFR calc non Af Amer: >60 mL/min (ref >60)
GLUCOSE: 131 mg/dL — AB (ref 65–99)
Potassium: 4 mmol/L (ref 3.5–5.1)
Sodium: 130 mmol/L — ABNORMAL LOW (ref 135–145)

## 2017-08-19 NOTE — Progress Notes (Signed)
Social Work  Social Work Assessment and Plan  Patient Details  Name: Dean Oliver MRN: [id number] Date of Birth: Jan 27, 1946  Today's Date: 08/19/2017  Problem List:  Patient Active Problem List   Diagnosis Date Noted  . Neurogenic bladder 08/18/2017  . Neurogenic bowel 08/18/2017  . Bacterial UTI 08/18/2017  . Central cord syndrome ([place]) 08/17/2017  . Dysphagia   . Fall   . Hypotension   . Sinus bradycardia   . HNP (herniated nucleus pulposus) with myelopathy, cervical   . Benign essential HTN   . Hyponatremia   . Acute blood loss anemia   . Symptomatic bradycardia 08/12/2017  . Schizophrenia [facility])    Past Medical History:  Past Medical History:  Diagnosis Date  . Cataracts, bilateral   . HTN (hypertension)   . Schizophrenia [facility])    Past Surgical History:  Past Surgical History:  Procedure Laterality Date  . None     Social History:  reports that  has never smoked. he has never used smokeless tobacco. He reports that he does not drink alcohol or use drugs.  Family / Support Systems Marital Status: Single Patient Roles: Other (Comment) Other Supports: cousin/ legal guardian, Dean Oliver @ ([phone] Anticipated Caregiver: SNF after reducing burden of care  Ability/Limitations of Caregiver: Group home not able to meet anticipated care needs. Caregiver Availability: Other (Comment)(Plan for SNF following CIR) Family Dynamics: Pt's cousin reports she "took over responsilibity" from her elderly parents in serving as pt's legal guardian.  She is supportive and checks on him regulary at the group home and had recently facilitated a move to another home as she had concerns about his care.    Social History Preferred language: English Religion:  Cultural Background: NA Education: unknown per cousin Read: No Write: No Employment Status: Disabled Freight forwarder Issues: None Guardian/Conservator: Dean Oliver is pt's legal guardian    Abuse/Neglect Abuse/Neglect Assessment Can Be Completed: Yes Physical Abuse: Denies Verbal Abuse: Denies Sexual Abuse: Denies Exploitation of patient/patient's resources: Denies Self-Neglect: Denies  Emotional Status Pt's affect, behavior adn adjustment status: Pt answers basic, personal questions.  Able to report he is in the hospital "because I fell".  Does not appear to be emotional distress by current situation.  Will monitor and provide support as indicated. Recent Psychosocial Issues: Moved into a new group home in July 2018 and guardian feels he has been much happier in the new home. Pyschiatric History: Schizophrenia;  mentally disabled since childhood. Substance Abuse History: NA  Patient / Family Perceptions, Expectations & Goals Pt/Family understanding of illness & functional limitations: Guardian with good, basic understanding of pt's injury, current functional limitations, anticipated care needs and plan for SNF following CIR. Premorbid pt/family roles/activities: Pt was physically independent PTA, however, did require daily home management assistance from group home staff. Anticipated changes in roles/activities/participation: Pt will now require much more physical assistacne which will require moving to a higher LOC/ SNF Pt/family expectations/goals: "I want him to have people keep working with him."  [employer]: Other (Comment)([facility]) Premorbid Home Care/DME Agencies: None Transportation available at discharge: yes  Discharge Planning Living Arrangements: [place]: Home care staff, Other relatives Type of Residence: Group home Insurance Resources: Medicaid (specify county), [employer] Financial Resources: SSD, SSI Financial Screen Referred: No Living Expenses: Other (Comment)(Group home under Medicaid) Money Management: Family Does the patient have any problems obtaining your medications?: No Home  Management: group home Patient/Family Preliminary Plans: Now planned  for pt to d/c to SNF Social Work Anticipated Follow Up Needs: SNF Expected length of stay: ~30 days   Clinical Impression Unfortunate gentleman who suffered a central cord injury during a fall at his group home.  Pleasant and answers very basic questions correctly.  Legal guardian/ cousin, Dean Oliver, is supportive and agreed with plan that pt will need to d/c to SNF due to extent of care needs.  Anticipating ~3-4 week stay to reduce burden of care.  Will follow for support and d/c planning assistance.  Dean Oliver 08/19/2017, 4:47 PM

## 2017-08-19 NOTE — Care Management Note (Signed)
[place] Individual Statement of Services  Patient Name:  Dean Oliver  Date:  08/19/2017  Welcome to the [place].  Our goal is to provide you with an individualized program based on your diagnosis and situation, designed to meet your specific needs.  With this comprehensive rehabilitation program, you will be expected to participate in at least 3 hours of rehabilitation therapies Monday-Friday, with modified therapy programming on the weekends.  Your rehabilitation program will include the following services:  Physical Therapy (PT), Occupational Therapy (OT), Speech Therapy (ST), 24 hour per day rehabilitation nursing, Therapeutic Recreaction (TR), Case Management (Social Worker), Rehabilitation Medicine, Nutrition Services and Pharmacy Services  Weekly team conferences will be held on Tuesdays to discuss your progress.  Your Social Worker will talk with you frequently to get your input and to update you on team discussions.  Team conferences with you and your family in attendance may also be held.  Expected length of stay: 3-4 week    Overall anticipated outcome: mod/ max assist w/c level  Depending on your progress and recovery, your program may change. Your Social Worker will coordinate services and will keep you informed of any changes. Your Social Worker's name and contact numbers are listed  below.  The following services may also be recommended but are not provided by the [place] will be made to provide these services after discharge if needed.  Arrangements include referral to agencies that provide these services.  Your insurance has been verified to be:  Medicaid and Medicare (B) Your primary doctor is:    Pertinent information will be shared with your doctor and  your insurance company.  Social Worker:  [place], [place] or ([phone]   Information discussed with and copy given to patient by: Lennart Pall, 08/19/2017, 4:48 PM

## 2017-08-19 NOTE — Progress Notes (Signed)
Speech Language Pathology Daily Session Note  Patient Details  Name: Dean Oliver MRN: [id number] Date of Birth: Jan 11, 1946  Today's Date: 08/19/2017 SLP Individual Time: 1335-1350 SLP Individual Time Calculation (min): 15 min  Short Term Goals: Week 1: SLP Short Term Goal 1 (Week 1): STG=LTG due to ELOS for ST follow up   Skilled Therapeutic Interventions:  Pt was seen for skilled ST targeting dysphagia goals.  Pt was asleep in bed upon therapist's arrival and needed minimalstimulation to awaken.  Once awakened pt needed lots of encouragement for participation in therapies.  Per report from RN and tech, pt had already eaten most of his lunch tray prior to therapist's arrival. Pt had ordered a cheeseburger but ate everything on his meal tray but the burger.  Rn reports that pt had difficulty chewing what little he ate of his burger which was the reason for him not finishing it.  Despite stating yesterday that he ate "whatever he wanted" at baseline, today he stated that he ate soft things like pinto beans.  As a result, therapist for now will downgrade pt to dys 3 diet and follow up for toleration.  Pt did awaken sufficiently to consume straw sips of thin liquids with no overt s/s of aspiration despite large, consecutive sips.  Pt was left in bed with bed alarm set and soft touch call bell within reach.  Continue per current plan of care.    Function:  Eating Eating   Modified Consistency Diet: Yes Eating Assist Level: Helper feeds patient           Cognition Comprehension Comprehension assist level: Understands basic 90% of the time/cues < 10% of the time  Expression   Expression assist level: Expresses basic 90% of the time/requires cueing < 10% of the time.  Social Interaction Social Interaction assist level: Interacts appropriately 90% of the time - Needs monitoring or encouragement for participation or interaction.  Problem Solving Problem solving assist level: Solves basic 25 -  49% of the time - needs direction more than half the time to initiate, plan or complete simple activities  Memory Memory assist level: Recognizes or recalls 50 - 74% of the time/requires cueing 25 - 49% of the time    Pain Pain Assessment Pain Assessment: No/denies pain   Therapy/Group: Individual Therapy  Dean Oliver, Selinda Orion 08/19/2017, 4:19 PM

## 2017-08-19 NOTE — Progress Notes (Addendum)
[place] PHYSICAL MEDICINE & REHABILITATION     PROGRESS NOTE    Subjective/Complaints: Still with low grade temp. Able to sleep last night. No focal complaints.   ROS: pt denies nausea, vomiting, diarrhea, cough, shortness of breath or chest pain   Objective: Vital Signs: Blood pressure (!) 135/52, pulse 68, temperature 99.6 F (37.6 C), temperature source Oral, resp. rate 18, height 5' 9.5" (1.765 m), weight 72.7 kg (160 lb 4.4 oz), SpO2 97 %. No results found. Recent Labs    08/17/17 1640 08/18/17 0757  WBC 5.7 8.7  HGB 10.3* 11.7*  HCT 31.5* 36.0*  PLT 173 226   Recent Labs    08/17/17 1640 08/18/17 0757  NA  --  129*  K  --  5.0  CL  --  101  GLUCOSE  --  122*  BUN  --  22*  CREATININE 0.60* 1.09  CALCIUM  --  8.4*   CBG (last 3)  No results for input(s): GLUCAP in the last 72 hours.  Wt Readings from Last 3 Encounters:  08/18/17 72.7 kg (160 lb 4.4 oz)    Physical Exam:  Head: Normocephalic and atraumatic.  Eyes: Right eye exhibits no discharge. Left eye exhibits no discharge. No scleral icterus.  Neck: Normal range of motion. Neck supple. No thyromegaly present.  Oral: edentulous Cardiovascular: bradycardic Respiratory:CTA Bilaterally without wheezes or rales. Normal effort  GI: Soft. Bowel sounds are normal. He exhibits no distension.  Musculoskeletal: He exhibits no edema or tenderness.  Neurological: He is alert.  Patient provides his name and age.  Severely dysarthric A&Ox3 Right lean Motor: B/l UE 0/5 proximal to distal B/l LE: HF 2 to 2+/5, KE 2+/5, distally 0/5 ---no changes Skin: Skin is hot, a few chronic changes Uro: foley in place, urine cloudy Psychiatric: His behavior is normal. His affect is blunt. anxious    Assessment/Plan: 1. 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 Physiatrist is  providing close team supervision and 24 hour management of active medical problems listed below. Physiatrist and rehab team continue to assess barriers to discharge/monitor patient progress toward functional and medical goals.  Function:  Bathing Bathing position   Position: Wheelchair/chair at sink  Bathing parts   Body parts bathed by helper: Right arm, Left arm, Chest, Abdomen, Front perineal area, Buttocks, Right upper leg, Left upper leg, Left lower leg, Back, Right lower leg  Bathing assist Assist Level: Touching or steadying assistance(Pt > 75%)      Upper Body Dressing/Undressing Upper body dressing   What is the patient wearing?: Pull over shirt/dress       Pull over shirt/dress - Perfomed by helper: Thread/unthread right sleeve, Thread/unthread left sleeve, Put head through opening, Pull shirt over trunk        Upper body assist Assist Level: Touching or steadying assistance(Pt > 75%)      Lower Body Dressing/Undressing Lower body dressing   What is the patient wearing?: Underwear, Pants, Non-skid slipper socks, Ted Hose   Underwear - Performed by helper: Thread/unthread left underwear leg, Thread/unthread right underwear leg, Pull underwear up/down   Pants- Performed by helper: Thread/unthread right pants leg, Thread/unthread left pants leg, Pull pants up/down   Non-skid slipper socks- Performed by helper: Don/doff right sock, Don/doff left sock               TED Hose - Performed by helper:  Don/doff right TED hose, Don/doff left TED hose  Lower body assist Assist for lower body dressing: Touching or steadying assistance (Pt > 75%)      Toileting Toileting Toileting activity did not occur: No continent bowel/bladder event        Toileting assist Assist level: (total)   Transfers Chair/bed transfer   Chair/bed transfer method: Lateral scoot Chair/bed transfer assist level: 2 helpers Chair/bed transfer assistive device: Sliding board      Locomotion Ambulation Ambulation activity did not occur: Safety/medical concerns         Wheelchair Wheelchair activity did not occur: Safety/medical concerns Type: Manual(quadriparesis)      Cognition Comprehension Comprehension assist level: Follows basic conversation/direction with extra time/assistive device  Expression Expression assist level: Expresses basic 90% of the time/requires cueing < 10% of the time.  Social Interaction Social Interaction assist level: Interacts appropriately 75 - 89% of the time - Needs redirection for appropriate language or to initiate interaction.  Problem Solving Problem solving assist level: Solves basic 25 - 49% of the time - needs direction more than half the time to initiate, plan or complete simple activities  Memory Memory assist level: Recognizes or recalls 50 - 74% of the time/requires cueing 25 - 49% of the time   Medical Problem List and Plan: 1.  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  DVT Prophylaxis/Anticoagulation: Subcutaneous heparin. Monitor for any bleeding episodes. Pending vascular study 3. Pain Management: Hydrocodone as needed, Zanaflex 4 mg every 6 hours as needed 4. Mood/schizophrenia: Xanax 0.25 mg twice daily as needed 5. Neuropsych: This patient is capable of making decisions on his own behalf. 6. Skin/Wound Care: Routine skin checks 7. Fluids/Electrolytes/Nutrition: encourage PO    -sodium down to 129 11/29.  Follow-up labs revealed improvement of sodium to 130.  Remaining labs are normal.  Follow-up sodium level on Monday 8. Hypotension with bradycardia with history of HTN. Bradycardia felt to be chronic. Follow-up cardiology services no further planned workup. Echocardiogram unremarkable. Troponin negative. Monitor with increased mobility 9. Hyponatremia. Follow-up  chemistries 10. Dysphagia. Dysphagia #2 thin liquids. Follow-up speech therapy 11. Low grade temp: urine suspicious. Also complains of foley irritation    -ua +, ucx pending    -empiric cipro started yesterday.   -remove foley.      LOS (Days) 2 A [place] T, MD 08/19/2017 9:26 AM

## 2017-08-19 NOTE — Progress Notes (Addendum)
Notified of low BP; pt recently given norco and zanaflex. Asymptomatic, arouses and answers questions. Will take po fluids when offered [facility] notified of findings. Monitor VS for now. [name]

## 2017-08-19 NOTE — IPOC Note (Signed)
Overall Plan of Care [facility] [place] [facility]) Patient Details Name: Dean Oliver MRN: [id number] DOB: [birth year]  Admitting Diagnosis: Central cord syndrome [facility])  Hospital Problems: Principal Problem:   Central cord syndrome [facility]) Active Problems:   Hyponatremia   Acute blood loss anemia   Neurogenic bladder   Neurogenic bowel   Bacterial UTI     Functional Problem List: Nursing Bladder, Bowel, Medication Management, Motor, Pain, Sensory, Endurance, Safety, Skin Integrity, Edema  PT Balance, Sensory, Behavior, Endurance, Motor, Pain, Perception, Safety, Skin Integrity  OT Behavior, Balance, Edema, Endurance, Cognition, Motor, Skin Integrity, Safety, Sensory  SLP Nutrition  TR         Basic ADL's: OT Grooming, Bathing, Dressing, Toileting, Eating     Advanced  ADL's: OT       Transfers: PT Bed Mobility, Bed to Chair  OT Toilet, Tub/Shower     Locomotion: PT       Additional Impairments: OT Fuctional Use of Upper Extremity  SLP Swallowing      TR      Anticipated Outcomes Item Anticipated Outcome  Self Feeding mod A  Swallowing  Supervision to clear pocketing (total assist likely needed for self feeding)   Basic self-care  mod A   Toileting  max A   Bathroom Transfers min A   Bowel/Bladder  Continent bowel and bladder, no retention, no s/s infection. Min. assist  Transfers  modA  Locomotion  dependent in w/c  Communication     Cognition     Pain  Managed at goal 2/10  Safety/Judgment  Increased safety awareness, no falls, injury this admisssion   Therapy Plan: PT Intensity: Minimum of 1-2 x/day ,45 to 90 minutes PT Frequency: 5 out of 7 days PT Duration Estimated Length of Stay: 26-28 days OT Intensity: Minimum of 1-2 x/day, 45 to 90 minutes OT Frequency: 5 out of 7 days OT Duration/Estimated Length of Stay: ~25-27 days SLP Intensity: Minumum of 1-2 x/day, 30 to 90 minutes SLP Frequency: 1 to 3 out of 7 days SLP Duration/Estimated Length of Stay: 1-2  additional sessions     Team Interventions: Nursing Interventions Patient/Family Education, Medication Management, Bowel Management, Psychosocial Support, Skin Care/Wound Management, Discharge Planning, Bladder Management, Pain Management  PT interventions Ambulation/gait training, Discharge planning, Functional mobility training, Therapeutic Activities, Wheelchair propulsion/positioning, Skin care/wound management, Therapeutic Exercise, Neuromuscular re-education, Disease management/prevention, Training and development officer, DME/adaptive equipment instruction, Cognitive remediation/compensation, Pain management, Splinting/orthotics, UE/LE Strength taining/ROM, UE/LE Coordination activities, Patient/family education  OT Interventions Balance/vestibular training, Discharge planning, Functional electrical stimulation, Pain management, Self Care/advanced ADL retraining, Therapeutic Activities, UE/LE Coordination activities, Cognitive remediation/compensation, Disease mangement/prevention, Functional mobility training, Patient/family education, Skin care/wound managment, Therapeutic Exercise, Community reintegration, DME/adaptive equipment instruction, Neuromuscular re-education, Psychosocial support, Splinting/orthotics, UE/LE Strength taining/ROM, Wheelchair propulsion/positioning  SLP Interventions Dysphagia/aspiration precaution training  TR Interventions    SW/CM Interventions Discharge Planning, Psychosocial Support, Patient/Family Education   Barriers to Discharge MD  Medical stability  Nursing Other (comments) From Group Home  PT Decreased caregiver support, Neurogenic Bowel & Bladder, Lack of/limited family support    OT Decreased caregiver support lives in group home  SLP      SW       Team Discharge Planning: Destination: PT-Skilled Nursing Facility (SNF) ,[place] [facility] (SNF) , [place] (SNF) Projected Follow-up: PT-Skilled nursing facility, OT-   Skilled nursing facility, SLP-None Projected Equipment Needs: PT-To be determined, OT- To be determined, SLP-None recommended by SLP Equipment Details: PT- , OT-  Patient/family involved in  discharge planning: PT- Patient,  OT-Patient, SLP-Patient  MD ELOS: 25-28 days Medical Rehab Prognosis:  Good Assessment: The patient has been admitted for CIR therapies with the diagnosis of central cord syndrome. The team will be addressing functional mobility, strength, stamina, balance, safety, adaptive techniques and equipment, self-care, bowel and bladder mgt, patient and caregiver education, neuromuscular reeducation, wheelchair assessment, spasticity management, pain control, ego support. Goals have been set at mod to max assist with basic self-care and ADLs and mod assist for basic transfers and wheelchair mobility.    Meredith Staggers, MD, FAAPMR      See Team Conference Notes for weekly updates to the plan of care

## 2017-08-19 NOTE — Progress Notes (Signed)
Physical Therapy Session Note  Patient Details  Name: Dean Oliver MRN: [id number] Date of Birth: October 30, 1945  Today's Date: 08/19/2017 PT Individual Time: 0800-0830 and 1100-1200 PT Individual Time Calculation (min): 30 min and 60 min (total 90 min)   Short Term Goals: Week 1:  PT Short Term Goal 1 (Week 1): Pt will perform rolling R/L with maxA +1 PT Short Term Goal 2 (Week 1): Pt will perform supine>sit maxA +1 PT Short Term Goal 3 (Week 1): Pt will perform lateral scoot transfer with maxA +1 PT Short Term Goal 4 (Week 1): Pt will demonstrate OOB tolerance 4 hours/day outside of therapy sessions  Skilled Therapeutic Interventions/Progress Updates: Tx 1: Pt received supine in bed, c/o pain in neck but does not rate, and agreeable to bed level treatment session. Performed BLE PROM to gastroc, soleus, hamstrings, hip IR/ER, glutes x1 min each. Educated pt on purpose of PROM to maintain muscle length for carryover into functional activities. Note decreased extensor tone compared to evaluation yesterday, and improved coordination with LE flexion/extension AAROM/resisted ROM into extension. Pt repositioned to faciliate neutral neck alignment; pt refused sidelying for position change and pressure relief. Discussed with OT, RN and MD regarding need for PRAFOs, resting hand splints, and air mattress vs turning schedule. Remained seated in bed, alarm intact and all needs in reach. Music utilized throughout session to increase compliance and involvement in session; self-selected "soul" music.  Tx 2: Pt received seated in bed, c/o pain as below but does not rate; agreeable to treatment. B TEDs donned totalA. Supine<>sit with totalA +2. Requires modA for static sitting balance on EOB, able to demonstrate minimal righting reactions with cueing with LOBs to R side. Transfer bed <>w/c and w/c <>mat table with transfer board and totalA, +2 to stabilize equipment. Sitting on edge of mat table, utilized mirror for  visual feedback, stability ball to facilitate thoracic extension and pec stretch. Performed PROM to B pecs, shoulder external rotation. Prior to transfer back to chair, pt able to demo static sitting balance x20 sec with min guard. Returned to bed as above; educated pt on new air mattress with rolling feature for pressure relief. Remained supine in bed at end of session, all needs in reach.      Therapy Documentation Precautions:  Precautions Precautions: Fall, Cervical Required Braces or Orthoses: Cervical Brace Cervical Brace: Hard collar, Other (comment)(okay to don in sitting) Restrictions Weight Bearing Restrictions: No Pain: Pain Assessment Faces Pain Scale: Hurts little more Pain Location: Neck Pain Orientation: Left Pain Descriptors / Indicators: Aching Patients Stated Pain Goal: 2 Pain Intervention(s): Medication (See eMAR);Repositioned   See Function Navigator for Current Functional Status.   Therapy/Group: Individual Therapy  Luberta Mutter 08/19/2017, 10:19 AM

## 2017-08-19 NOTE — Progress Notes (Signed)
Orthopedic Tech Progress Note Patient Details:  Dean Oliver 1945/11/30 [id number]  Patient ID: Dean Oliver, male   DOB: 03/07/1946, 71 y.o.   MRN: [id number]   [name] 08/19/2017, 10:42 AM Called in bio-tech brace order; spoke with [facility]

## 2017-08-19 NOTE — Progress Notes (Signed)
Occupational Therapy Session Note  Patient Details  Name: Dean Oliver MRN: [id number] Date of Birth: 01/03/1946  Today's Date: 08/19/2017 OT Individual Time: 0915-1001 OT Individual Time Calculation (min): 46 min  and Today's Date: 08/19/2017 OT Missed Time: 14 Minutes Missed Time Reason: Patient unwilling/refused to participate without medical reason;Patient fatigue   Short Term Goals: Week 1:  OT Short Term Goal 1 (Week 1): Pt will transfer to toilet/ BSC with mod A +1 with LRAD OT Short Term Goal 2 (Week 1): Pt will sitting EOB with supervision in prep for ADL task  OT Short Term Goal 3 (Week 1): Pt will perform one grooming task with max A with HOH A OT Short Term Goal 4 (Week 1): Pt will perform rolling right / left in bed with mod A for LB dressing  OT Short Term Goal 5 (Week 1): Pt will perform sit to stand in prep for ADL task with mod A +2  Skilled Therapeutic Interventions/Progress Updates:    1;1. Pt agreeable to OT session upon arrival willing to wash up and change clothes. Pt requires HOH TOTAL A for washing UB. Educated pt on directing care while working on improving functional status. Attempted to have pt verbalize sequencing bathing body parts to work on cognition, however pt unable to recall what was already bathed. Pt able to lift BLE into pant legs ~1 inch off of bed for OT to thread B feet into pants. Pt rolls with MAX A and Vc for sequencing as OT advances pants past hips. Pt able to bend knees to assist OT bringing BLE off of bed for supine<>EOB transitition with total A. Pt sits EOB with overall MOD A for ~4 min while OT dons shirt. Pt says, "I am weak this is hard." Pt refuses to don C collar and exclaims, "put me back to bed, im done." OT repositions in bed with pillows under B arms, soft touch call light by head and bed exit alarm on. Pt missed 14 min skilled OT 2/2 refusal to participate/fatigue.  Therapy Documentation Precautions:  Precautions Precautions:  Fall, Cervical Required Braces or Orthoses: Cervical Brace Cervical Brace: Hard collar, Other (comment)(okay to don in sitting) Restrictions Weight Bearing Restrictions: No General:   Vital Signs:  Pain: Pain Assessment Faces Pain Scale: Hurts little more Pain Location: Neck Pain Orientation: Left Pain Descriptors / Indicators: Aching Patients Stated Pain Goal: 2 Pain Intervention(s): Medication (See eMAR);Repositioned ADL:  See Function Navigator for Current Functional Status.   Therapy/Group: Individual Therapy  Tonny Branch 08/19/2017, 10:01 AM

## 2017-08-19 NOTE — Progress Notes (Signed)
VS rechecked per recommendation. PT is sleeping but easily arouses Denies pain or discomfort or lightheadedness at present Takes po fluids as offered Notified Alcide Goodness of status KTEDs on and rotating bed in place no other changes noted T=98.0 axillary no new orders received [name]

## 2017-08-20 ENCOUNTER — Inpatient Hospital Stay (HOSPITAL_COMMUNITY): Payer: Medicare Other | Admitting: Physical Therapy

## 2017-08-20 ENCOUNTER — Inpatient Hospital Stay (HOSPITAL_COMMUNITY): Payer: Medicare Other

## 2017-08-20 DIAGNOSIS — M7989 Other specified soft tissue disorders: Secondary | ICD-10-CM

## 2017-08-20 MED ORDER — CEPHALEXIN 250 MG PO CAPS
500.0000 mg | ORAL_CAPSULE | Freq: Two times a day (BID) | ORAL | Status: DC
  Administered 2017-08-20 – 2017-08-22 (×4): 500 mg via ORAL
  Filled 2017-08-20 (×4): qty 2

## 2017-08-20 NOTE — Progress Notes (Signed)
Bilateral lower extremity venous duplex has been completed. Negative for DVT.  08/20/17 1:27 PM Carlos Levering RVT

## 2017-08-20 NOTE — Progress Notes (Signed)
Dean Oliver is a 71 y.o. male 25-Dec-1945 [id number]  Subjective: No new complaints. No new problems. Slept well. Feeling OK.  Objective: Vital signs in last 24 hours: Temp:  [97.9 F (36.6 C)-98.8 F (37.1 C)] 97.9 F (36.6 C) (12/01 0253) Pulse Rate:  [44-91] 52 (12/01 0253) Resp:  [16-17] 16 (12/01 0253) BP: (85-142)/(44-61) 142/57 (12/01 0253) SpO2:  [96 %-100 %] 96 % (12/01 0253) Weight change:  Last BM Date: 08/17/17  Intake/Output from previous day: 11/30 0701 - 12/01 0700 In: 1320 [P.O.:1320] Out: 1775 [Urine:1775] Last cbgs: CBG (last 3)  No results for input(s): GLUCAP in the last 72 hours.   Physical Exam General: No apparent distress   HEENT: not dry Lungs: Normal effort. Lungs clear to auscultation, no crackles or wheezes. Cardiovascular: Regular rate and rhythm, no edema Abdomen: S/NT/ND; BS(+) Musculoskeletal:  unchanged Neurological: No new neurological deficits Wounds: N/A    Skin: clear  Aging changes Mental state: Alert, cooperative    Lab Results: BMET    Component Value Date/Time   NA 130 (L) 08/19/2017 1003   K 4.0 08/19/2017 1003   CL 100 (L) 08/19/2017 1003   CO2 26 08/19/2017 1003   GLUCOSE 131 (H) 08/19/2017 1003   BUN 21 (H) 08/19/2017 1003   CREATININE 0.71 08/19/2017 1003   CALCIUM 8.2 (L) 08/19/2017 1003   GFRNONAA >60 08/19/2017 1003   GFRAA >60 08/19/2017 1003   CBC    Component Value Date/Time   WBC 8.7 08/18/2017 0757   RBC 4.32 08/18/2017 0757   HGB 11.7 (L) 08/18/2017 0757   HCT 36.0 (L) 08/18/2017 0757   PLT 226 08/18/2017 0757   MCV 83.3 08/18/2017 0757   MCH 27.1 08/18/2017 0757   MCHC 32.5 08/18/2017 0757   RDW 12.4 08/18/2017 0757   LYMPHSABS 1.1 08/18/2017 0757   MONOABS 0.5 08/18/2017 0757   EOSABS 0.0 08/18/2017 0757   BASOSABS 0.0 08/18/2017 0757    Studies/Results: No results found.  Medications: I have reviewed the patient's current medications.  Assessment/Plan:   1. 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 CIR: PT/OT 2. Pain control: Norco prn 3. Schizophrenia/anxiety: Xanax prn 4. HTN. Off BP meds 5. Hyponatremia - monitor BMET 6. Dysphagia.  Dysphagia #2 thin liquids. Follow-up speech therapy     Length of stay, days: 3  Walker Kehr , MD 08/20/2017, 1:31 PM

## 2017-08-20 NOTE — Progress Notes (Signed)
Physical Therapy Session Note  Patient Details  Name: Dean Oliver MRN: [id number] Date of Birth: 1946-04-25  Today's Date: 08/20/2017 PT Individual Time: 1017-1100 PT Individual Time Calculation (min): 43 min   Short Term Goals: Week 1:  PT Short Term Goal 1 (Week 1): Pt will perform rolling R/L with maxA +1 PT Short Term Goal 2 (Week 1): Pt will perform supine>sit maxA +1 PT Short Term Goal 3 (Week 1): Pt will perform lateral scoot transfer with maxA +1 PT Short Term Goal 4 (Week 1): Pt will demonstrate OOB tolerance 4 hours/day outside of therapy sessions  Skilled Therapeutic Interventions/Progress Updates:   Pt supine upon arrival and agreeable to therapy, c/o neck pain but unable to describe further. Pt required maximal encouragement to get OOB, stating "I need to stay in bed to get well". Educated pt on importance and benefit from OOB activity. Transferred to EOB and donned c-collar w/ Total A, transferred to w/c via slide board Total A + 2 w/ verbal and manual cues for technique. Total A w/c transport to/from gym. Attempted gait w/ Max A +2, however pt unable to maintain upright and repeatedly stating "I can't walk", unable to take any steps w/o Total A. Pt agreeable to perform Kinetron for 5 min on lowest resistance - Mod A overall to facilitate reciprocal movement pattern. Pt remained frustrated and requesting to return to bed. Pt agreeable to spend time up in w/c if tilted and assisted to be made comfortable. Ended session in supine, call bell within reach and all needs met. Situated call bell between pt's knees and tested calling nurse w/ that technique success vs use of UEs.   Therapy Documentation Precautions:  Precautions Precautions: Fall, Cervical Required Braces or Orthoses: Cervical Brace Cervical Brace: Hard collar, Other (comment)(okay to don in sitting) Restrictions Weight Bearing Restrictions: No  See Function Navigator for Current Functional  Status.   Therapy/Group: Individual Therapy  Lanah Steines K Arnette 08/20/2017, 12:14 PM

## 2017-08-21 ENCOUNTER — Inpatient Hospital Stay (HOSPITAL_COMMUNITY): Payer: Medicare Other

## 2017-08-21 DIAGNOSIS — D62 Acute posthemorrhagic anemia: Secondary | ICD-10-CM

## 2017-08-21 NOTE — Progress Notes (Signed)
Occupational Therapy Session Note  Patient Details  Name: MARCELLIUS MONTAGNA MRN: [id number] Date of Birth: June 24, 1946  Today's Date: 08/21/2017 OT Individual Time: 8403-3533 OT Individual Time Calculation (min): 27 min    Short Term Goals: Week 1:  OT Short Term Goal 1 (Week 1): Pt will transfer to toilet/ BSC with mod A +1 with LRAD OT Short Term Goal 2 (Week 1): Pt will sitting EOB with supervision in prep for ADL task  OT Short Term Goal 3 (Week 1): Pt will perform one grooming task with max A with HOH A OT Short Term Goal 4 (Week 1): Pt will perform rolling right / left in bed with mod A for LB dressing  OT Short Term Goal 5 (Week 1): Pt will perform sit to stand in prep for ADL task with mod A +2  Skilled Therapeutic Interventions/Progress Updates:    1:1. Pt asleep upon arrival with multimodal cues to arouse. Pt agreeable to tx and c/o pain in arms but does not rate. Pt supine<>sitting EOB and C collar donned/doffed with total A. Pt sits EOB to work on postural control and AAROM of BUE. Pt with posterior lean despite manual facilitation forwards. Ball placed in pt lap as target for pt to touch his nose to ball. Pt able to shift weight back and forth with touching A, however with fatigue pt leans R. Throuhgout session pt requires MOD A overall for sitting balance EOB. With OT providing total HOH A pt performs 1x15 shoulder flexion/extension with hand pushing ball, horizontal ab/adduction towel glide on table and elbow flexion/extension to scratch nose. Pt able to count out reps while participating in exercise. Exited session with pt semi recline in bed with call light near head and all needs met.  Therapy Documentation Precautions:  Precautions Precautions: Fall, Cervical Required Braces or Orthoses: Cervical Brace Cervical Brace: Hard collar, Other (comment)(okay to don in sitting) Restrictions Weight Bearing Restrictions: No  See Function Navigator for Current Functional  Status.   Therapy/Group: Individual Therapy  Tonny Branch 08/21/2017, 5:09 PM

## 2017-08-21 NOTE — Progress Notes (Signed)
Occupational Therapy Session Note  Patient Details  Name: Dean Oliver MRN: [id number] Date of Birth: 12/09/1945  Today's Date: 08/21/2017 OT Individual Time: 1829-9371 OT Individual Time Calculation (min): 54 min    Short Term Goals: Week 1:  OT Short Term Goal 1 (Week 1): Pt will transfer to toilet/ BSC with mod A +1 with LRAD OT Short Term Goal 2 (Week 1): Pt will sitting EOB with supervision in prep for ADL task  OT Short Term Goal 3 (Week 1): Pt will perform one grooming task with max A with HOH A OT Short Term Goal 4 (Week 1): Pt will perform rolling right / left in bed with mod A for LB dressing  OT Short Term Goal 5 (Week 1): Pt will perform sit to stand in prep for ADL task with mod A +2  Skilled Therapeutic Interventions/Progress Updates:    1:1. Pain in neck "hurts a little bit". Repositioned with different pillow. Pt agreeable to eating breakfast. OT provides total A to set up tray, open packages, cut up food and feed patient providing HOH A of dominant RUE to self feed. Pt unable to recall items on tray frequently asking, "what do I got?" Once knowing options, pt able to direct care on what he wanted to eat next. OT provided small bites and sips rations for pt and educated pt when asking for bigger bite on risk of aspiration. OT provided PROM of BUE in all planes of motion to decrease risk of contracture and manage tone. Nearing end of session, pt states, "that's enough" and requests OT to reposition pt and end session. Exited session after repositioning pillows and soft touch call light near pt chin.  Therapy Documentation Precautions:  Precautions Precautions: Fall, Cervical Required Braces or Orthoses: Cervical Brace Cervical Brace: Hard collar, Other (comment)(okay to don in sitting) Restrictions Weight Bearing Restrictions: No  See Function Navigator for Current Functional Status.   Therapy/Group: Individual Therapy  Tonny Branch 08/21/2017, 6:54 AM

## 2017-08-21 NOTE — Progress Notes (Signed)
Physical Therapy Session Note  Patient Details  Name: Dean Oliver MRN: [id number] Date of Birth: 12/27/1945  Today's Date: 08/21/2017 PT Individual Time: 1000-1025, 1330-1415 PT Individual Time Calculation (min): 25 min , 45 min  and Today's Date: 08/21/2017 PT Missed Time: 35 Minutes Missed Time Reason: Patient unwilling to participate;Patient fatigue  Short Term Goals: Week 1:  PT Short Term Goal 1 (Week 1): Pt will perform rolling R/L with maxA +1 PT Short Term Goal 2 (Week 1): Pt will perform supine>sit maxA +1 PT Short Term Goal 3 (Week 1): Pt will perform lateral scoot transfer with maxA +1 PT Short Term Goal 4 (Week 1): Pt will demonstrate OOB tolerance 4 hours/day outside of therapy sessions  Skilled Therapeutic Interventions/Progress Updates:    Session 1: Pt supine in bed upon PT arrival, pt refusing to participate in therapy this session stating that his neck hurts and "I already did an hour of this today." Therapist educated the patient on the importance of OOB activity, pt continued to decline. Pt agreeable to PROM in the bed, performed BLE PROM to gastroc, soleus, hamstrings, hip IR/ER, glutes x1 min each. Pt requesting to lay on his R side, therapist assisted pt in rolling in order to lay on R side, total assist. Pt requesting muscle rub for neck pain, therapist applied and performed soft tissue release to cervical musculature. Pt left sidelying in bed at end of session, needs in reach.  Pt missed 35 minutes of skilled therapeutic treatment this session secondary to fatigue and unwillingness to participate.  Session 2: Pt supine in bed upon PT arrival, agreeable to therapy tx this session and reports pain in neck but does not rate. Pt continuously states "my neck is sore" throughout session. Pt transferred supine>sitting EOB with total assist, cervical collar donned total assist. Pt refusing to transfer to w/c at this time, stating he would like to stay in bed. Session focused  on seated balance while participating in cognitive tasks. In sitting, pt with increased posterior lean, verbal and manual facilitation for anterior lean and pt able to hold for about 10 seconds. Worked on sitting>sidelying on elbows x 2 each side, with total assist to move in/out of position, pt able to maintain position 15 sec with close supervision before losing balance posteriorly. In sitting EOB therapist provided manual facilitation for trunk extension and upright posture. While working on seated balance pt engaged in cognitive task to name foods and colors on bean bags, pt only able to correctly name 1/10. Pt stated he likes cards, therapist asked pt to name card games, pt only able to state 2. Pt transferred back to supine at end of session with total assist. Left supine in bed with pillows to promote positioning and needs in reach.      Therapy Documentation Precautions:  Precautions Precautions: Fall, Cervical Required Braces or Orthoses: Cervical Brace Cervical Brace: Hard collar, Other (comment)(okay to don in sitting) Restrictions Weight Bearing Restrictions: No   See Function Navigator for Current Functional Status.   Therapy/Group: Individual Therapy  Netta Corrigan, PT, DPT 08/21/2017, 7:58 AM

## 2017-08-21 NOTE — Progress Notes (Signed)
Dean Oliver is a 71 y.o. male October 08, 1945 [id number]  Subjective: No new complaints. No new problems. Slept well. Feeling OK.  Objective: Vital signs in last 24 hours: Temp:  [97.9 F (36.6 C)-98.9 F (37.2 C)] 98.9 F (37.2 C) (12/02 1459) Pulse Rate:  [52-60] 52 (12/02 1459) Resp:  [17-18] 17 (12/02 1459) BP: (118-162)/(53-70) 118/53 (12/02 1459) SpO2:  [99 %-100 %] 100 % (12/02 1459) Weight change:  Last BM Date: 08/17/17  Intake/Output from previous day: 12/01 0701 - 12/02 0700 In: 118 [P.O.:118] Out: 1375 [Urine:1375] Last cbgs: CBG (last 3)  No results for input(s): GLUCAP in the last 72 hours.   Physical Exam General: No apparent distress   HEENT: not dry Lungs: Normal effort. Lungs clear to auscultation, no crackles or wheezes. Cardiovascular: Regular rate and rhythm, no edema Abdomen: S/NT/ND; BS(+) Musculoskeletal:  unchanged Neurological: No new neurological deficits Wounds: N/A    Skin: clear  Aging changes Mental state: Alert, cooperative    Lab Results: BMET    Component Value Date/Time   NA 130 (L) 08/19/2017 1003   K 4.0 08/19/2017 1003   CL 100 (L) 08/19/2017 1003   CO2 26 08/19/2017 1003   GLUCOSE 131 (H) 08/19/2017 1003   BUN 21 (H) 08/19/2017 1003   CREATININE 0.71 08/19/2017 1003   CALCIUM 8.2 (L) 08/19/2017 1003   GFRNONAA >60 08/19/2017 1003   GFRAA >60 08/19/2017 1003   CBC    Component Value Date/Time   WBC 8.7 08/18/2017 0757   RBC 4.32 08/18/2017 0757   HGB 11.7 (L) 08/18/2017 0757   HCT 36.0 (L) 08/18/2017 0757   PLT 226 08/18/2017 0757   MCV 83.3 08/18/2017 0757   MCH 27.1 08/18/2017 0757   MCHC 32.5 08/18/2017 0757   RDW 12.4 08/18/2017 0757   LYMPHSABS 1.1 08/18/2017 0757   MONOABS 0.5 08/18/2017 0757   EOSABS 0.0 08/18/2017 0757   BASOSABS 0.0 08/18/2017 0757    Studies/Results: No results found.  Medications: I have reviewed the patient's current medications.  Assessment/Plan:   1.  Decreased  functional mobility and quadratus comparison is due to central cord injury/myelopathy with herniation.  Continue with physical therapy and occupational therapy. 2.  Pain control with Norco as needed.   3. Schizophrenia and anxiety.  Xanax as needed. 4.  Hypertension.  The patient is currently off his blood pressure medicines.   5. Hyponatremia.  We will monitor labs. 6.  Dysphagia.  He is on dysphagia #2 thin liquids.  Follow-up with speech therapy.        Length of stay, days: [place] , MD 08/21/2017, 9:02 PM

## 2017-08-22 ENCOUNTER — Encounter (HOSPITAL_COMMUNITY): Payer: Self-pay

## 2017-08-22 ENCOUNTER — Inpatient Hospital Stay (HOSPITAL_COMMUNITY): Payer: Medicare Other | Admitting: Physical Therapy

## 2017-08-22 ENCOUNTER — Inpatient Hospital Stay (HOSPITAL_COMMUNITY): Payer: Medicare Other | Admitting: Speech Pathology

## 2017-08-22 ENCOUNTER — Inpatient Hospital Stay (HOSPITAL_COMMUNITY): Payer: Medicare Other

## 2017-08-22 ENCOUNTER — Other Ambulatory Visit: Payer: Self-pay

## 2017-08-22 LAB — BASIC METABOLIC PANEL
Anion gap: 7 (ref 5–15)
BUN: 10 mg/dL (ref 6–20)
CALCIUM: 7.9 mg/dL — AB (ref 8.9–10.3)
CHLORIDE: 96 mmol/L — AB (ref 101–111)
CO2: 26 mmol/L (ref 22–32)
CREATININE: 0.65 mg/dL (ref 0.61–1.24)
GFR calc Af Amer: >60 mL/min (ref >60)
GFR calc non Af Amer: >60 mL/min (ref >60)
GLUCOSE: 149 mg/dL — AB (ref 65–99)
Potassium: 3.8 mmol/L (ref 3.5–5.1)
Sodium: 129 mmol/L — ABNORMAL LOW (ref 135–145)

## 2017-08-22 LAB — URINE CULTURE

## 2017-08-22 MED ORDER — AMOXICILLIN-POT CLAVULANATE 500-125 MG PO TABS
1.0000 | ORAL_TABLET | Freq: Two times a day (BID) | ORAL | Status: AC
Start: 2017-08-22 — End: 2017-08-29
  Administered 2017-08-22 – 2017-08-29 (×14): 500 mg via ORAL
  Filled 2017-08-22 (×14): qty 1

## 2017-08-22 NOTE — Progress Notes (Signed)
Occupational Therapy Session Note  Patient Details  Name: Dean Oliver MRN: [id number] Date of Birth: 10-15-1945  Today's Date: 08/22/2017 OT Individual Time: 1020-1100 OT Individual Time Calculation (min): 40 min    Short Term Goals: Week 1:  OT Short Term Goal 1 (Week 1): Pt will transfer to toilet/ BSC with mod A +1 with LRAD OT Short Term Goal 2 (Week 1): Pt will sitting EOB with supervision in prep for ADL task  OT Short Term Goal 3 (Week 1): Pt will perform one grooming task with max A with HOH A OT Short Term Goal 4 (Week 1): Pt will perform rolling right / left in bed with mod A for LB dressing  OT Short Term Goal 5 (Week 1): Pt will perform sit to stand in prep for ADL task with mod A +2  Skilled Therapeutic Interventions/Progress Updates:    Pt seen this session to focus on BUE ROM.  Pt has very limited shoulder PROM and pt reports that he has had tightness for many years.  Worked on slow progressive movements to increase shoulder extension and external range.  Elbow flexor stretching with a/arom of elbow extensors. Pt has trace activity in his triceps.  PROM to wrists and fingers.  Pt is unable to use his head or hands for soft call light. Place light on foot pedal and had pt practice moving his foot to press call light 3x.  Due to limited cognition and coordination of RLE, nursing stated they are aware to check on pt frequently as his being able to use the call light consistently is uncertain.  Pt is able to call for assist with his voice. Pt resting in w/c with all needs met.  Therapy Documentation Precautions:  Precautions Precautions: Fall, Cervical Required Braces or Orthoses: Cervical Brace Cervical Brace: Hard collar, Other (comment)(okay to don in sitting) Restrictions Weight Bearing Restrictions: No  Pain: Pain Assessment Pain Assessment: 0-10 Pain Score: 3  Pain Type: Acute pain Pain Location: Shoulder Pain Orientation: Left Pain Descriptors / Indicators:  Aching Pain Frequency: Intermittent Pain Onset: Gradual Patients Stated Pain Goal: 0 Pain Intervention(s): Medication (See eMAR);Repositioned ADL: ADL ADL Comments: see functional navigator   See Function Navigator for Current Functional Status.   Therapy/Group: Individual Therapy  [place] 08/22/2017, 8:50 AM

## 2017-08-22 NOTE — Progress Notes (Signed)
[place] PHYSICAL MEDICINE & REHABILITATION     PROGRESS NOTE    Subjective/Complaints: No new issues reported. Still not emptying. req I/O caths. Low grade temp still  ROS: pt denies nausea, vomiting, diarrhea, cough, shortness of breath or chest pain   Objective: Vital Signs: Blood pressure (!) 150/66, pulse (!) 50, temperature 99.3 F (37.4 C), temperature source Oral, resp. rate 18, height 5' 9.5" (1.765 m), weight 72.7 kg (160 lb 4.4 oz), SpO2 100 %. No results found. No results for input(s): WBC, HGB, HCT, PLT in the last 72 hours. Recent Labs    08/19/17 1003 08/22/17 0536  NA 130* 129*  K 4.0 3.8  CL 100* 96*  GLUCOSE 131* 149*  BUN 21* 10  CREATININE 0.71 0.65  CALCIUM 8.2* 7.9*   CBG (last 3)  No results for input(s): GLUCAP in the last 72 hours.  Wt Readings from Last 3 Encounters:  08/18/17 72.7 kg (160 lb 4.4 oz)    Physical Exam:  Head: Normocephalic and atraumatic.  Eyes: Right eye exhibits no discharge. Left eye exhibits no discharge. No scleral icterus.  Neck: Normal range of motion. Neck supple. No thyromegaly present.  Oral: edentulous Cardiovascular: reg/brady Respiratory:CTA Bilaterally without wheezes or rales. Normal effort   GI: Soft. Bowel sounds are normal. He exhibits no distension.  Musculoskeletal: He exhibits no edema or tenderness.  Neurological: He is alert.  Patient provides his name and age.  Severely dysarthric A&Ox3 Right lean Motor: B/l UE 0/5 proximal to distal B/l LE: HF 2 to 2+/5, KE 2+/5, distally 0/5 ---stable Skin: Skin is hot, a few chronic changes Uro: foley in place, urine cloudy Psychiatric: a little anxious    Assessment/Plan: 1. 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 Physiatrist is providing close team supervision and 24 hour management of active medical problems  listed below. Physiatrist and rehab team continue to assess barriers to discharge/monitor patient progress toward functional and medical goals.  Function:  Bathing Bathing position   Position: Wheelchair/chair at sink  Bathing parts   Body parts bathed by helper: Right arm, Left arm, Chest, Abdomen, Front perineal area, Buttocks, Right upper leg, Left upper leg, Left lower leg, Back, Right lower leg  Bathing assist Assist Level: Touching or steadying assistance(Pt > 75%)      Upper Body Dressing/Undressing Upper body dressing   What is the patient wearing?: Pull over shirt/dress       Pull over shirt/dress - Perfomed by helper: Thread/unthread right sleeve, Thread/unthread left sleeve, Put head through opening, Pull shirt over trunk        Upper body assist Assist Level: Touching or steadying assistance(Pt > 75%)      Lower Body Dressing/Undressing Lower body dressing   What is the patient wearing?: Underwear, Pants, Non-skid slipper socks, Ted Hose   Underwear - Performed by helper: Thread/unthread left underwear leg, Thread/unthread right underwear leg, Pull underwear up/down   Pants- Performed by helper: Thread/unthread right pants leg, Thread/unthread left pants leg, Pull pants up/down   Non-skid slipper socks- Performed by helper: Don/doff right sock, Don/doff left sock               TED Hose - Performed by helper: Don/doff right TED hose, Don/doff left TED hose  Lower body assist Assist for lower body dressing: Touching or steadying assistance (Pt > 75%)      Toileting Toileting  Toileting activity did not occur: No continent bowel/bladder event   Toileting steps completed by helper: Adjust clothing prior to toileting, Performs perineal hygiene, Adjust clothing after toileting    Toileting assist Assist level: Two helpers   Transfers Chair/bed transfer   Chair/bed transfer method: Lateral scoot Chair/bed transfer assist level: 2 helpers Chair/bed transfer  assistive device: Sliding board     Locomotion Ambulation Ambulation activity did not occur: Safety/medical concerns         Wheelchair Wheelchair activity did not occur: Safety/medical concerns Type: Manual(quadriparesis)      Cognition Comprehension Comprehension assist level: Follows complex conversation/direction with no assist  Expression Expression assist level: Expresses complex ideas: With no assist  Social Interaction Social Interaction assist level: Interacts appropriately 90% of the time - Needs monitoring or encouragement for participation or interaction.  Problem Solving Problem solving assist level: Solves basic 25 - 49% of the time - needs direction more than half the time to initiate, plan or complete simple activities  Memory Memory assist level: Recognizes or recalls 50 - 74% of the time/requires cueing 25 - 49% of the time   Medical Problem List and Plan: 1.  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  DVT Prophylaxis/Anticoagulation: Subcutaneous heparin. Monitor for any bleeding episodes. Pending vascular study 3. Pain Management: Hydrocodone as needed, Zanaflex 4 mg every 6 hours as needed 4. Mood/schizophrenia: Xanax 0.25 mg twice daily as needed 5. Neuropsych: This patient is capable of making decisions on his own behalf. 6. Skin/Wound Care: Routine skin checks 7. Fluids/Electrolytes/Nutrition: encourage PO    -sodium still 129 12/3--continue to monitor, no changes in plan today 8. Hypotension with bradycardia with history of HTN. Bradycardia felt to be chronic. Follow-up cardiology services no further planned workup. Echocardiogram unremarkable. Troponin negative. Monitor with increased mobility 9. Hyponatremia. Follow-up chemistries 10. Dysphagia. Dysphagia #2 thin liquids. Follow-up speech therapy 11. UTI/neurogenic  bladder: urine suspicious. Also complains of foley irritation    -empiric cipro started last week. UCX + for proteus and enterococcus---change to keflex, enteroc UCS pending   -I/O caths to keep volumes 300-500 cc, needs to be done more frequently     LOS (Days) 5 A FACE TO FACE EVALUATION WAS PERFORMED  Meredith Staggers, MD 08/22/2017 8:32 AM

## 2017-08-22 NOTE — Progress Notes (Signed)
Occupational Therapy Session Note  Patient Details  Name: Dean Oliver MRN: [id number] Date of Birth: [birth year]  Today's Date: 08/22/2017 OT Individual Time: 4854-6270 OT Individual Time Calculation (min): 55 min    Short Term Goals: Week 1:  OT Short Term Goal 1 (Week 1): Pt will transfer to toilet/ BSC with mod A +1 with LRAD OT Short Term Goal 2 (Week 1): Pt will sitting EOB with supervision in prep for ADL task  OT Short Term Goal 3 (Week 1): Pt will perform one grooming task with max A with HOH A OT Short Term Goal 4 (Week 1): Pt will perform rolling right / left in bed with mod A for LB dressing  OT Short Term Goal 5 (Week 1): Pt will perform sit to stand in prep for ADL task with mod A +2  Skilled Therapeutic Interventions/Progress Updates:    Pt resting in bed upon arrival.  Pt required tot A for bathing/dressing at bed level and seated in w/c.  Pt follows one step commands to roll in bed with mod A.  Pt required max A for supine>sit EOB and mod A for sitting balance EOB.  Pt required max A + 2(safety) for slide board transfer to w/c.  Pt donned shirt seated in w/c with tot A.  BUE stretching performed.  Pt placed on night bath and RN notified.  Focus on bed mobility, following one step commands, sitting balance, functional transfers, BUE use, and activity tolerance to increase independence with BADLs.   Therapy Documentation Precautions:  Precautions Precautions: Fall, Cervical Required Braces or Orthoses: Cervical Brace Cervical Brace: Hard collar, Other (comment)(okay to don in sitting) Restrictions Weight Bearing Restrictions: No  Pain:  "It's better" pt unable to clarify location of pain that "was better"  See Function Navigator for Current Functional Status.   Therapy/Group: Individual Therapy  [name] 08/22/2017, 9:58 AM

## 2017-08-22 NOTE — Progress Notes (Signed)
SLP Cancellation Note  Patient Details Name: Dean Oliver MRN: [id number] DOB: 22-Feb-1946   Cancelled treatment:       Patient missed 60 minutes of skilled SLP intervention 2/2 just finishing his lunch meal. Patient reported he was full and declined snacks despite multiple attempts by SLP. Patient also reported neck pain. RN aware. Patient left supine in bed with all needs within reach. Continue with current plan of care.                                                                                                 Jenell Dobransky 08/22/2017, 2:41 PM

## 2017-08-22 NOTE — Progress Notes (Signed)
Physical Therapy Session Note  Patient Details  Name: Dean Oliver MRN: [id number] Date of Birth: [birth year]  Today's Date: 08/22/2017 PT Individual Time: 1100-1140 PT Individual Time Calculation (min): 40 min   Short Term Goals: Week 1:  PT Short Term Goal 1 (Week 1): Pt will perform rolling R/L with maxA +1 PT Short Term Goal 2 (Week 1): Pt will perform supine>sit maxA +1 PT Short Term Goal 3 (Week 1): Pt will perform lateral scoot transfer with maxA +1 PT Short Term Goal 4 (Week 1): Pt will demonstrate OOB tolerance 4 hours/day outside of therapy sessions  Skilled Therapeutic Interventions/Progress Updates: Pt received seated in w/c, c/o headache and agreeable to treatment. Transfer w/c <>mat table totalA with slideboard; improving trunk control and initiating upright/forward lean to A with transfer. Sitting balance on mat table with min guard x3 min; pt able to demo semi-reclined sit-up aiming for therapists hands posteriorly with target gradually lowered to approximately 45 degree angle with good core activation to return to upright. Propped elbow sidesitting 2x30 sec each side for weight bearing, proximal muscle activation and core stability; limited by "soreness" in neck with longer duration. Sit <>supine maxA for log roll technique. Performed BLE hook lying trunk rotation AAROM x10 each direction to A with reducing tone in LEs. Bridging x5 reps BLE with AAROM. Pt requesting to return to bed, reports he is very fatigued. Returned to w/c as above; transfer w/c>bed with totalA. Sit >supine totalA. Remained semi-reclined in bed, all needs in reach.      Therapy Documentation Precautions:  Precautions Precautions: Fall, Cervical Required Braces or Orthoses: Cervical Brace Cervical Brace: Hard collar, Other (comment)(okay to don in sitting) Restrictions Weight Bearing Restrictions: No General: PT Amount of Missed Time (min): 20 Minutes PT Missed Treatment Reason: Patient unwilling to  participate;Patient fatigue   See Function Navigator for Current Functional Status.   Therapy/Group: Individual Therapy  Luberta Mutter 08/22/2017, 11:36 AM

## 2017-08-22 NOTE — Plan of Care (Signed)
Pt comfortable at this time Need remains for pt to be straight Catheterized Pt remains a total assist for care and feeding

## 2017-08-22 NOTE — Progress Notes (Signed)
Physical Therapy Session Note  Patient Details  Name: Dean Oliver MRN: [id number] Date of Birth: November 16, 1945  Today's Date: 08/22/2017 PT Individual Time: 7903-8333 PT Individual Time Calculation (min): 30 min   Short Term Goals: Week 1:  PT Short Term Goal 1 (Week 1): Pt will perform rolling R/L with maxA +1 PT Short Term Goal 2 (Week 1): Pt will perform supine>sit maxA +1 PT Short Term Goal 3 (Week 1): Pt will perform lateral scoot transfer with maxA +1 PT Short Term Goal 4 (Week 1): Pt will demonstrate OOB tolerance 4 hours/day outside of therapy sessions  Skilled Therapeutic Interventions/Progress Updates:  Pt supine in bed upon therapist arrival. Pt reports 9/10 pain in his neck, not agreeable to any OOB or EOB activity. Nursing notified of pt's current pain level, pt does not have any pain medication available at this time. Despite encouragement to sit EOB and education on importance of movement pt is not agreeable to sit EOB. Supine BLE stretches and therex AAROM x 5-10 reps. Pt exhibits significant tone in BLE as well as BLE weakness, R>L. Pt positioned for comfort at end of session, PRAFOs donned, supine in bed.     Therapy Documentation Precautions:  Precautions Precautions: Fall, Cervical Required Braces or Orthoses: Cervical Brace Cervical Brace: Hard collar, Other (comment)(okay to don in sitting) Restrictions Weight Bearing Restrictions: No  See Function Navigator for Current Functional Status.   Therapy/Group: Individual Therapy  Excell Seltzer 08/22/2017, 3:55 PM

## 2017-08-23 ENCOUNTER — Inpatient Hospital Stay (HOSPITAL_COMMUNITY): Payer: Medicare Other

## 2017-08-23 ENCOUNTER — Ambulatory Visit (HOSPITAL_COMMUNITY): Payer: Medicare Other | Admitting: Occupational Therapy

## 2017-08-23 ENCOUNTER — Inpatient Hospital Stay (HOSPITAL_COMMUNITY): Payer: Medicare Other | Admitting: Physical Therapy

## 2017-08-23 MED ORDER — NITROFURANTOIN MONOHYD MACRO 100 MG PO CAPS
100.0000 mg | ORAL_CAPSULE | Freq: Two times a day (BID) | ORAL | Status: DC
  Administered 2017-08-23: 100 mg via ORAL
  Filled 2017-08-23: qty 1

## 2017-08-23 MED ORDER — BACITRACIN ZINC 500 UNIT/GM EX OINT
TOPICAL_OINTMENT | Freq: Two times a day (BID) | CUTANEOUS | Status: DC
  Administered 2017-08-23 – 2017-08-25 (×5): via TOPICAL
  Administered 2017-08-26: 1 via TOPICAL
  Administered 2017-08-26 – 2017-09-07 (×22): via TOPICAL
  Filled 2017-08-23 (×7): qty 28.35

## 2017-08-23 MED ORDER — FLEET ENEMA 7-19 GM/118ML RE ENEM: 1.0000 | ENEMA | Freq: Every day | RECTAL | Status: DC | PRN

## 2017-08-23 MED ORDER — SENNOSIDES-DOCUSATE SODIUM 8.6-50 MG PO TABS
2.0000 | ORAL_TABLET | Freq: Every day | ORAL | Status: DC
  Administered 2017-08-23 – 2017-09-06 (×14): 2 via ORAL
  Filled 2017-08-23 (×14): qty 2

## 2017-08-23 MED ORDER — BISACODYL 10 MG RE SUPP
10.0000 mg | Freq: Every day | RECTAL | Status: DC
Start: 2017-08-24 — End: 2017-08-29
  Administered 2017-08-24 – 2017-08-29 (×6): 10 mg via RECTAL
  Filled 2017-08-23 (×6): qty 1

## 2017-08-23 NOTE — Progress Notes (Signed)
Occupational Therapy Session Note  Patient Details  Name: Dean Oliver MRN: [id number] Date of Birth: [birth year]  Today's Date: 08/23/2017 OT Individual Time: 0800-0900 OT Individual Time Calculation (min): 60 min    Short Term Goals: Week 1:  OT Short Term Goal 1 (Week 1): Pt will transfer to toilet/ BSC with mod A +1 with LRAD OT Short Term Goal 2 (Week 1): Pt will sitting EOB with supervision in prep for ADL task  OT Short Term Goal 3 (Week 1): Pt will perform one grooming task with max A with HOH A OT Short Term Goal 4 (Week 1): Pt will perform rolling right / left in bed with mod A for LB dressing  OT Short Term Goal 5 (Week 1): Pt will perform sit to stand in prep for ADL task with mod A +2  Skilled Therapeutic Interventions/Progress Updates:    Initial focus on self feeding with [facility] assistance.  Pt became frustrated and stated he didn't want "any more" after eating approx 25% of meal.  Focus transitioned to LB dressing at bed level with max A for rolling/bridging to pull pants over hips.  Pt required tot A + 2 for supine>sit and steady A for sitting balance at EOB. Pt required max A for squat pivot transfer (higher surface to lower surface).  Pt noted with inconsistent BUE internal rotator muscle tone limited PROM.  NMES applied to RUE wrist/finger extensors (NMES, large muscle, atrophy) with significant muscle activity.  Pt unable to elicit movement independently.   1:1 NMES applied to RUE wrist/finger extensors  Ratio 1:3 Rate 35 pps Waveform- Asymmetric Ramp 1.0 Pulse 300 Intensity- 27 Duration - 5 mins   Report of pain at the beginning of session  Report of pain at the end of session  No adverse reactions after treatment and is skin intact.   Therapy Documentation Precautions:  Precautions Precautions: Fall, Cervical Required Braces or Orthoses: Cervical Brace Cervical Brace: Hard collar, Other (comment)(okay to don in sitting) Restrictions Weight Bearing  Restrictions: No  Pain: Pt c/o back and shoulder pain (unrated); Muscle Rub applied with reported relief    See Function Navigator for Current Functional Status.   Therapy/Group: Individual Therapy  [name] 08/23/2017, 12:03 PM

## 2017-08-23 NOTE — Progress Notes (Signed)
Physical Therapy Session Note  Patient Details  Name: Dean Oliver MRN: [id number] Date of Birth: Jan 30, 1946  Today's Date: 08/23/2017 PT Individual Time: 1115-1200 PT Individual Time Calculation (min): 45 min   Short Term Goals: Week 1:  PT Short Term Goal 1 (Week 1): Pt will perform rolling R/L with maxA +1 PT Short Term Goal 2 (Week 1): Pt will perform supine>sit maxA +1 PT Short Term Goal 3 (Week 1): Pt will perform lateral scoot transfer with maxA +1 PT Short Term Goal 4 (Week 1): Pt will demonstrate OOB tolerance 4 hours/day outside of therapy sessions  Skilled Therapeutic Interventions/Progress Updates: Pt received seated in w/c, denies pain and agreeable to treatment. RN present to perform I&O catheter; missed 15 min PT. Performed BLE PROM to gastroc, soleus, hamstrings, hip internal rotators 2x1 min each. Performed sit <>stand with mod/maxA, LUE over therapist's shoulder. Ambulated x2 trials 5' and 7' with maxA +2 with assist/cueing for upright posture, assist to reduce scissoring and facilitate stance control. Pt perseverative on stating "I can't walk yet, once my neck gets better I'll be able to". Educated pt in purpose of weight bearing, initiating gait training to facilitate recovery and strengthen LEs, improve coordination, etc. Pt remained semi-reclined in w/c at end of session, all needs in reach.      Therapy Documentation Precautions:  Precautions Precautions: Fall, Cervical Required Braces or Orthoses: Cervical Brace Cervical Brace: Hard collar, Other (comment)(okay to don in sitting) Restrictions Weight Bearing Restrictions: No General: PT Amount of Missed Time (min): 15 Minutes PT Missed Treatment Reason: Nursing care Pain: Pain Assessment Pain Score: 0-No pain   See Function Navigator for Current Functional Status.   Therapy/Group: Individual Therapy  Luberta Mutter 08/23/2017, 12:03 PM

## 2017-08-23 NOTE — Progress Notes (Signed)
Speech Language Pathology Daily Session Note  Patient Details  Name: Dean Oliver MRN: [id number] Date of Birth: 1946/08/12  Today's Date: 08/23/2017 SLP Individual Time: 1200-1300 SLP Individual Time Calculation (min): 60 min  Short Term Goals: Week 1: SLP Short Term Goal 1 (Week 1): STG=LTG due to ELOS for ST follow up   Skilled Therapeutic Interventions: Skilled ST services focused swallow skills. SLP positioned pt in chair at 90 degrees and donned aspen collar for proper PO consumption. SLP facilitated PO consumption of dys 3 and thin liquids via straw providing total assistance during feeding and providing mod verbal cues to remove pocketed foods pt was unable to masticate (aspargus.) Pt demonstrated mod right and left side pocketing, however no overt s/s aspiration noted.Pt was left in room with call bell positions to left of head. Recommend to continue skilled ST services.     Function:  Eating Eating   Modified Consistency Diet: Yes Eating Assist Level: Helper feeds patient;Supervision or verbal cues;Helper checks for pocketed food           Cognition Comprehension Comprehension assist level: Understands basic 75 - 89% of the time/ requires cueing 10 - 24% of the time  Expression   Expression assist level: Expresses basic 75 - 89% of the time/requires cueing 10 - 24% of the time. Needs helper to occlude trach/needs to repeat words.  Social Interaction Social Interaction assist level: Interacts appropriately 90% of the time - Needs monitoring or encouragement for participation or interaction.  Problem Solving Problem solving assist level: Solves basic 75 - 89% of the time/requires cueing 10 - 24% of the time  Memory Memory assist level: Recognizes or recalls 90% of the time/requires cueing < 10% of the time    Pain Pain Assessment Pain Assessment: No/denies pain  Therapy/Group: Individual Therapy  Javi Bollman  [facility] 08/23/2017, 4:22 PM

## 2017-08-23 NOTE — Progress Notes (Signed)
Physical Therapy Session Note  Patient Details  Name: Dean Oliver MRN: [id number] Date of Birth: [birth year]  Today's Date: 08/23/2017 PT Individual Time: 3428-7681 PT Individual Time Calculation (min): 54 min   Short Term Goals: Week 1:  PT Short Term Goal 1 (Week 1): Pt will perform rolling R/L with maxA +1 PT Short Term Goal 2 (Week 1): Pt will perform supine>sit maxA +1 PT Short Term Goal 3 (Week 1): Pt will perform lateral scoot transfer with maxA +1 PT Short Term Goal 4 (Week 1): Pt will demonstrate OOB tolerance 4 hours/day outside of therapy sessions  Skilled Therapeutic Interventions/Progress Updates:  Pt seated in w/c upon therapist arrival, pt is agreeable to participate in therapy session. Pt does not rate pain but reports it is improved from yesterday. Squat pivot transfer w/c to mat Max Assist with facilitation for anterior weightshift and cues for pt to push through BLE. Sitting balance EOM with one therapist behind pt on therapy ball to provide trunk support and encourage upright posture with manual cueing, focus on leaning forwards and right/left to reach target and then correct back to midline. Pt requires Mod-Max Assist to lean and return to midline. Pt able to maintain static sitting balance with close supervision to min assist.  Seated BUE stretches, attempt to have pt perform voluntary UE movement, pt is only able to perform B shoulder elevation and trace elbow flexion on L UE. Pt is only able to vaguely verbalize where he feels the stretch in his UE. Sit to supine total assist x 2, attempt to have pt bridge with BLE to scoot up on mat. NMR for rolling L/R with focus on correct positioning and using LE strength to initiate roll with tactile cues through shoulder and pelvis. Pt requires Mod-Max Assist to roll to the R and is total assist to roll to the left. Supine to sit total assist x 2. Scoot pivot mat -> w/c -> bed with decreased clearance noted requiring total +2 assist due  to fatigue. Pt left supine in bed with B PRAFOs and B hand splints in place.    Therapy Documentation Precautions:  Precautions Precautions: Fall, Cervical Required Braces or Orthoses: Cervical Brace Cervical Brace: Hard collar, Other (comment)(okay to don in sitting) Restrictions Weight Bearing Restrictions: No  See Function Navigator for Current Functional Status.   Therapy/Group: Individual Therapy  Excell Seltzer, PT Lars Masson, PT, DPT  08/23/2017, 3:36 PM

## 2017-08-23 NOTE — Progress Notes (Signed)
[place] PHYSICAL MEDICINE & REHABILITATION     PROGRESS NOTE    Subjective/Complaints: Sitting in bed being fed by the nurse tech.  Denies pain.  Seems to have good appetite.  Had questions about his therapies today.  Having some struggles with catheterizations and passage of the catheter itself  ROS: pt denies nausea, vomiting, diarrhea, cough, shortness of breath or chest pain   Objective: Vital Signs: Blood pressure (!) 160/76, pulse (!) 50, temperature 98.3 F (36.8 C), temperature source Oral, resp. rate 16, height 5' 9.5" (1.765 m), weight 72.7 kg (160 lb 4.4 oz), SpO2 98 %. No results found. No results for input(s): WBC, HGB, HCT, PLT in the last 72 hours. Recent Labs    08/22/17 0536  NA 129*  K 3.8  CL 96*  GLUCOSE 149*  BUN 10  CREATININE 0.65  CALCIUM 7.9*   CBG (last 3)  No results for input(s): GLUCAP in the last 72 hours.  Wt Readings from Last 3 Encounters:  08/18/17 72.7 kg (160 lb 4.4 oz)    Physical Exam:  Head: Normocephalic and atraumatic.  Eyes: Right eye exhibits no discharge. Left eye exhibits no discharge. No scleral icterus.  Neck: Normal range of motion. Neck supple. No thyromegaly present.  Oral: edentulous Cardiovascular: Rhythm regular, remains bradycardic Respiratory:CT-scan of the abdomen i  GI: Soft. Bowel sounds are normal. He exhibits no distension.  Musculoskeletal: He exhibits no edema or tenderness.  Neurological: He is alert.  Patient provides his name and age.  Severely dysarthric A&Ox3 Right lean Motor: B/l UE 0/5 proximal to distal--no substantial changes B/l LE: HF 2 to 2+/5, KE 2+/5, distally 0/5 --- stable Skin: Skin is hot, a few chronic changes Uro:  Psychiatric: a little anxious    Assessment/Plan: 1. 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 Physiatrist is providing close  team supervision and 24 hour management of active medical problems listed below. Physiatrist and rehab team continue to assess barriers to discharge/monitor patient progress toward functional and medical goals.  Function:  Bathing Bathing position   Position: Wheelchair/chair at sink  Bathing parts   Body parts bathed by helper: Right arm, Left arm, Chest, Abdomen, Front perineal area, Buttocks, Right upper leg, Left upper leg, Left lower leg, Back, Right lower leg  Bathing assist Assist Level: (total assist)      Upper Body Dressing/Undressing Upper body dressing   What is the patient wearing?: Pull over shirt/dress       Pull over shirt/dress - Perfomed by helper: Thread/unthread right sleeve, Thread/unthread left sleeve, Put head through opening, Pull shirt over trunk        Upper body assist Assist Level: (total assist)      Lower Body Dressing/Undressing Lower body dressing   What is the patient wearing?: Underwear, Pants, Non-skid slipper socks, Ted Hose   Underwear - Performed by helper: Thread/unthread left underwear leg, Thread/unthread right underwear leg, Pull underwear up/down   Pants- Performed by helper: Thread/unthread right pants leg, Thread/unthread left pants leg, Pull pants up/down   Non-skid slipper socks- Performed by helper: Don/doff right sock, Don/doff left sock               TED Hose - Performed by helper: Don/doff right TED hose, Don/doff left TED hose  Lower body assist Assist for lower body dressing: (total assist)      Naval architect  activity did not occur: No continent bowel/bladder event   Toileting steps completed by helper: Adjust clothing prior to toileting, Performs perineal hygiene, Adjust clothing after toileting    Toileting assist Assist level: Two helpers   Transfers Chair/bed transfer   Chair/bed transfer method: Lateral scoot Chair/bed transfer assist level: Total assist (Pt < 25%) Chair/bed transfer  assistive device: Sliding board     Locomotion Ambulation Ambulation activity did not occur: Safety/medical concerns         Wheelchair Wheelchair activity did not occur: Safety/medical concerns Type: Manual(quadriparesis)      Cognition Comprehension Comprehension assist level: Understands basic 90% of the time/cues < 10% of the time  Expression Expression assist level: Expresses basic 75 - 89% of the time/requires cueing 10 - 24% of the time. Needs helper to occlude trach/needs to repeat words.  Social Interaction Social Interaction assist level: Interacts appropriately 90% of the time - Needs monitoring or encouragement for participation or interaction.  Problem Solving Problem solving assist level: Solves basic 25 - 49% of the time - needs direction more than half the time to initiate, plan or complete simple activities  Memory Memory assist level: Recognizes or recalls 50 - 74% of the time/requires cueing 25 - 49% of the time   Medical Problem List and Plan: 1.  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  DVT Prophylaxis/Anticoagulation: Subcutaneous heparin. Monitor for any bleeding episodes. Pending vascular study 3. Pain Management: Hydrocodone as needed, Zanaflex 4 mg every 6 hours as needed 4. Mood/schizophrenia: Xanax 0.25 mg twice daily as needed 5. Neuropsych: This patient is capable of making decisions on his own behalf. 6. Skin/Wound Care: Routine skin checks 7. Fluids/Electrolytes/Nutrition: encourage PO    -sodium still 129 12/3--continue to monitor, recheck sodium level later this week 8. Hypotension with bradycardia with history of HTN. Bradycardia felt to be chronic. Follow-up cardiology services no further planned workup. Echocardiogram unremarkable. Troponin negative. Monitor with increased mobility 9. Hyponatremia. See above 10.  Dysphagia. Dysphagia #2 thin liquids.advance per speech therapy 11. UTI/neurogenic bladder: urine suspicious. Also complains of foley irritation    -empiric cipro started last week. UCX + for proteus and enterococcus---change to keflex, enteroc not S to same abx. Will add macrobid 100mg  bid to cover enterococcus.    -I/O caths to keep volumes 300-500 cc, volume still too high    -[facility] intake urology regarding phimosis and difficulty passing catheters     LOS (Days) 6 A FACE TO [place] T, MD 08/23/2017 8:28 AM

## 2017-08-23 NOTE — Progress Notes (Signed)
Pt has continued swelling with foreskin retracted.  Therapy with pt and breakdown noted between head and shaft of penis.  Informed Terie Purser [facility] that Urology has not been by at of yet

## 2017-08-23 NOTE — Progress Notes (Signed)
Pt resting in bed quietly. Easily aroused. Able to make needs known. Denies any pain at this time. Complete tetraplegia noted with rhythmic spasms noted when turning and repositioning. bladder scanning with I & O cath noted per order. Safety maintained, will continue to monitor.

## 2017-08-23 NOTE — Progress Notes (Signed)
Physical Therapy Session Note  Patient Details  Name: Dean Oliver MRN: [id number] Date of Birth: May 09, 1946  Today's Date: 08/23/2017 PT Individual Time: 0900-0925 PT Individual Time Calculation (min): 25 min   Short Term Goals: Week 1:  PT Short Term Goal 1 (Week 1): Pt will perform rolling R/L with maxA +1 PT Short Term Goal 2 (Week 1): Pt will perform supine>sit maxA +1 PT Short Term Goal 3 (Week 1): Pt will perform lateral scoot transfer with maxA +1 PT Short Term Goal 4 (Week 1): Pt will demonstrate OOB tolerance 4 hours/day outside of therapy sessions  Skilled Therapeutic Interventions/Progress Updates:   Pt in w/c upon arrival and agreeable to therapy, c/o neck soreness. Total A w/c mobility to/from gym and worked on sit<>stands in gym. Pt performed sit<>stand x5 w/ Mod-Max A. Focused on achieving upright posture, however unable to break out of forward-head/rounded-shoulder posture even in standing. Max cues for facilitation of full knee and hip extension in stance. Worked on sitting w/o back support in between sit<>stand transfers. Able to maintain static sitting w/ close supervision to Min guard w/o back support. Returned to room and Total A to reposition in w/c for comfort to work on tolerance to [place]. Ended session in w/c w/ hot packs applied to shoulders and back of neck as were present at beginning of session. Call bell placed to R of head for pt's ease w/ calling nurses station, all needs met.   Therapy Documentation Precautions:  Precautions Precautions: Fall, Cervical Required Braces or Orthoses: Cervical Brace Cervical Brace: Hard collar, Other (comment)(okay to don in sitting) Restrictions Weight Bearing Restrictions: No  See Function Navigator for Current Functional Status.   Therapy/Group: Individual Therapy  Tamila Gaulin K Arnette 08/23/2017, 9:28 AM

## 2017-08-23 NOTE — Consult Note (Signed)
I have been asked to see the patient by Dr. Tawni Levy, for evaluation and management of paraphimosis.  History of present illness: 71 year old male with a history of schizophrenia who presented to the emergency department 10 days prior with neurological deficits and was found to have central cord syndrome.  As part of the symptoms of his neurologic: Deficits he was noted to have urinary retention.  A Foley catheter was placed in the emergency room and subsequently removed prior to discharge.  He was placed on clean intermittent catheterization is currently being cath every 6 hours.  As part of the cathing routine, his foreskin has been retracted, and unfortunately, not replaced.  As such, a paraphimosis has resulted.  The patient is uncircumcised.  There is no significant issue with catheterizing him.  Review of systems: A 12 point comprehensive review of systems was obtained and is negative unless otherwise stated in the history of present illness.  Patient Active Problem List   Diagnosis Date Noted  . Neurogenic bladder 08/18/2017  . Neurogenic bowel 08/18/2017  . Bacterial UTI 08/18/2017  . Central cord syndrome ([place]) 08/17/2017  . Dysphagia   . Fall   . Hypotension   . Sinus bradycardia   . HNP (herniated nucleus pulposus) with myelopathy, cervical   . Benign essential HTN   . Hyponatremia   . Acute blood loss anemia   . Symptomatic bradycardia 08/12/2017  . Schizophrenia ([place])     No current facility-administered medications on file prior to encounter.    Current Outpatient Medications on File Prior to Encounter  Medication Sig Dispense Refill  . acetaminophen (TYLENOL) 325 MG tablet Take 2 tablets (650 mg total) by mouth every 4 (four) hours as needed for headache or mild pain. 30 tablet 0  . ALPRAZolam (XANAX) 0.25 MG tablet Take 1 tablet (0.25 mg total) by mouth 2 (two) times daily as needed for anxiety. 6 tablet 0  . aspirin EC 81 MG EC tablet Take 1 tablet (81 mg  total) by mouth daily. 30 tablet 0  . dorzolamide-timolol (COSOPT) 22.3-6.8 MG/ML ophthalmic solution Place 1 drop into both eyes daily.    Marland Kitchen HYDROcodone-acetaminophen (NORCO/VICODIN) 5-325 MG tablet Take 1-2 tablets by mouth every 6 (six) hours as needed for moderate pain. 10 tablet 0  . latanoprost (XALATAN) 0.005 % ophthalmic solution Place 1 drop into both eyes at bedtime.    . Menthol-Methyl Salicylate (MUSCLE RUB) 10-15 % CREA Apply 1 application topically as needed for muscle pain. 113 g 0  . Multiple Vitamins-Minerals (CERTAVITE SENIOR/ANTIOXIDANT) TABS Take 1 tablet by mouth daily.    . potassium chloride SA (K-DUR,KLOR-CON) 20 MEQ tablet Take 2 tablets (40 mEq total) by mouth 2 (two) times daily. 10 tablet 0  . risperidone (RISPERDAL) 4 MG tablet Take 4 mg by mouth 2 (two) times daily.    Marland Kitchen tiZANidine (ZANAFLEX) 4 MG tablet Take 1 tablet (4 mg total) by mouth every 6 (six) hours as needed for muscle spasms. 10 tablet 0  . zolpidem (AMBIEN) 5 MG tablet Take 1 tablet (5 mg total) by mouth at bedtime as needed for sleep. 10 tablet 0    Past Medical History:  Diagnosis Date  . Cataracts, bilateral   . HTN (hypertension)   . Schizophrenia [facility])     Past Surgical History:  Procedure Laterality Date  . None      Social History   Tobacco Use  . Smoking status: Never Smoker  . Smokeless tobacco: Never Used  Substance  Use Topics  . Alcohol use: No    Frequency: Never  . Drug use: No    Family History  Family history unknown: Yes    PE: Vitals:   08/22/17 0443 08/22/17 1446 08/23/17 0522 08/23/17 1500  BP: (!) 150/66 134/78 (!) 160/76 (!) 165/69  Pulse: (!) 50 (!) 55 (!) 50 (!) 59  Resp: 18 18 16 16   Temp: 99.3 F (37.4 C) 98.4 F (36.9 C) 98.3 F (36.8 C) 98.3 F (36.8 C)  TempSrc: Oral Oral Oral Oral  SpO2: 100% 97% 98% 100%  Weight:      Height:       Patient appears to be in no acute distress  patient is alert and does not appear to be aware of the  circumstances. Atraumatic normocephalic head No cervical or supraclavicular lymphadenopathy appreciated No increased work of breathing, no audible wheezes/rhonchi Regular sinus rhythm/rate Abdomen is soft, nontender, nondistended, no CVA or suprapubic tenderness The glans penis is swollen as is the prepuce.  There is an avulsion of some of his foreskin on the dorsal aspect. Lower extremities are symmetric without appreciable edema Grossly neurologically intact No identifiable skin lesions  No results for input(s): WBC, HGB, HCT in the last 72 hours. Recent Labs    08/22/17 0536  NA 129*  K 3.8  CL 96*  CO2 26  GLUCOSE 149*  BUN 10  CREATININE 0.65  CALCIUM 7.9*   No results for input(s): LABPT, INR in the last 72 hours. No results for input(s): LABURIN in the last 72 hours. Results for orders placed or performed during the hospital encounter of 08/17/17  Urine Culture     Status: Abnormal   Collection Time: 08/18/17  4:48 PM  Result Value Ref Range Status   Specimen Description URINE, CATHETERIZED  Final   Special Requests NONE  Final   Culture (A)  Final    >=100,000 COLONIES/mL PROTEUS MIRABILIS >=100,000 COLONIES/mL ENTEROCOCCUS FAECALIS    Report Status 08/22/2017 FINAL  Final   Organism ID, Bacteria PROTEUS MIRABILIS (A)  Final   Organism ID, Bacteria ENTEROCOCCUS FAECALIS (A)  Final      Susceptibility   Enterococcus faecalis - MIC*    AMPICILLIN <=2 SENSITIVE Sensitive     LEVOFLOXACIN 1 SENSITIVE Sensitive     NITROFURANTOIN <=16 SENSITIVE Sensitive     VANCOMYCIN 1 SENSITIVE Sensitive     * >=100,000 COLONIES/mL ENTEROCOCCUS FAECALIS   Proteus mirabilis - MIC*    AMPICILLIN >=32 RESISTANT Resistant     CEFAZOLIN 8 SENSITIVE Sensitive     CEFTRIAXONE <=1 SENSITIVE Sensitive     CIPROFLOXACIN >=4 RESISTANT Resistant     GENTAMICIN <=1 SENSITIVE Sensitive     IMIPENEM 2 SENSITIVE Sensitive     NITROFURANTOIN 128 RESISTANT Resistant     TRIMETH/SULFA <=20  SENSITIVE Sensitive     AMPICILLIN/SULBACTAM 4 SENSITIVE Sensitive     PIP/TAZO <=4 SENSITIVE Sensitive     * >=100,000 COLONIES/mL PROTEUS MIRABILIS    Imaging: none  Imp: The patient has paraphimosis that I was able to reduce at the bedside.  His foreskin is now been replaced.  Result of the phimosis was a skin tear on the foreskin on the dorsum of his penis.    Recommendations: It needs to be recognized that the patient is uncircumcised by the nursing staff so that each time he is catheterized the foreskin is replaced and this does not develop again.  The wound that resulted on the dorsum of  his foreskin should be cared for with topical antibiotic ointment twice daily.  The patient does not need any immediate or direct follow-up with urology for this matter.  Thank you for involving me in this patient's care, please page with any further questions or concerns.  Louis Meckel W

## 2017-08-23 NOTE — Progress Notes (Signed)
Late note from 12/3 pm:  Nursing reported problems with patient's foreskin. Exam revealed edematous retracted foreskin above the meatus with evidence of breakdown due to pressure and unable to reduce.   Urology consulted and discussed with MD who will follow up for evaluations. Marland Kitchen

## 2017-08-24 ENCOUNTER — Inpatient Hospital Stay (HOSPITAL_COMMUNITY): Payer: Medicare Other | Admitting: Occupational Therapy

## 2017-08-24 ENCOUNTER — Inpatient Hospital Stay (HOSPITAL_COMMUNITY): Payer: Medicare Other

## 2017-08-24 ENCOUNTER — Inpatient Hospital Stay (HOSPITAL_COMMUNITY): Payer: Medicare Other | Admitting: Physical Therapy

## 2017-08-24 MED ORDER — BACLOFEN 5 MG HALF TABLET
5.0000 mg | ORAL_TABLET | Freq: Two times a day (BID) | ORAL | Status: DC
  Administered 2017-08-24 – 2017-09-07 (×29): 5 mg via ORAL
  Filled 2017-08-24 (×29): qty 1

## 2017-08-24 NOTE — Progress Notes (Signed)
[place] PHYSICAL MEDICINE & REHABILITATION     PROGRESS NOTE    Subjective/Complaints: No issues. Being fed breakfast. Complains of some back pain  ROS: Limited due to cognitive/behavioral   Objective: Vital Signs: Blood pressure 130/60, pulse 65, temperature 98.3 F (36.8 C), temperature source Oral, resp. rate 18, height 5' 9.5" (1.765 m), weight 72.7 kg (160 lb 4.4 oz), SpO2 99 %. No results found. No results for input(s): WBC, HGB, HCT, PLT in the last 72 hours. Recent Labs    08/22/17 0536  NA 129*  K 3.8  CL 96*  GLUCOSE 149*  BUN 10  CREATININE 0.65  CALCIUM 7.9*   CBG (last 3)  No results for input(s): GLUCAP in the last 72 hours.  Wt Readings from Last 3 Encounters:  08/18/17 72.7 kg (160 lb 4.4 oz)    Physical Exam:  Head: Normocephalic and atraumatic.  Eyes: Right eye exhibits no discharge. Left eye exhibits no discharge. No scleral icterus.  Neck: Normal range of motion. Neck supple. No thyromegaly present.  Oral: edentulous Cardiovascular: bradycardic Respiratory:CT-scan of the abdomen i  GI: Soft. Bowel sounds are normal. He exhibits no distension.  Musculoskeletal: He exhibits no edema or tenderness.  Neurological: He is alert.  Patient provides his name and age.  Severely dysarthric A&Ox3 Right lean Motor: B/l UE 0/5 proximal to distal--no substantial changes B/l LE: HF 2 to 2+/5, KE 2+/5, distally 1-2/5. Trace resting tone. ?HAD Skin: Skin is hot, a few chronic changes Uro: foreskin now reduced  Psychiatric: a little anxious    Assessment/Plan: 1. 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 Physiatrist is providing close team supervision and 24 hour management of active medical problems listed below. Physiatrist and rehab team continue to assess barriers to discharge/monitor patient progress toward  functional and medical goals.  Function:  Bathing Bathing position   Position: Wheelchair/chair at sink  Bathing parts   Body parts bathed by helper: Right arm, Left arm, Chest, Abdomen, Front perineal area, Buttocks, Right upper leg, Left upper leg, Left lower leg, Back, Right lower leg  Bathing assist Assist Level: (total assist)      Upper Body Dressing/Undressing Upper body dressing   What is the patient wearing?: Pull over shirt/dress       Pull over shirt/dress - Perfomed by helper: Thread/unthread right sleeve, Thread/unthread left sleeve, Put head through opening, Pull shirt over trunk        Upper body assist Assist Level: (total assist)      Lower Body Dressing/Undressing Lower body dressing   What is the patient wearing?: Underwear, Pants, Non-skid slipper socks, Ted Hose   Underwear - Performed by helper: Thread/unthread left underwear leg, Thread/unthread right underwear leg, Pull underwear up/down   Pants- Performed by helper: Thread/unthread right pants leg, Thread/unthread left pants leg, Pull pants up/down   Non-skid slipper socks- Performed by helper: Don/doff right sock, Don/doff left sock               TED Hose - Performed by helper: Don/doff right TED hose, Don/doff left TED hose  Lower body assist Assist for lower body dressing: (total assist)      Toileting Toileting Toileting activity did not occur: No continent bowel/bladder event   Toileting steps completed by helper: Adjust clothing prior to toileting, Performs perineal hygiene, Adjust clothing after toileting    Toileting assist Assist level: Two  helpers   Transfers Chair/bed transfer   Chair/bed transfer method: Squat pivot Chair/bed transfer assist level: 2 helpers Chair/bed transfer assistive device: Production assistant, radio Ambulation activity did not occur: Safety/medical concerns   Max distance: 7' Assist level: 2 helpers   Oceanographer activity  did not occur: Safety/medical concerns Type: Manual(quadriparesis)      Cognition Comprehension Comprehension assist level: Understands basic 75 - 89% of the time/ requires cueing 10 - 24% of the time  Expression Expression assist level: Expresses basic 75 - 89% of the time/requires cueing 10 - 24% of the time. Needs helper to occlude trach/needs to repeat words.  Social Interaction Social Interaction assist level: Interacts appropriately 90% of the time - Needs monitoring or encouragement for participation or interaction.  Problem Solving Problem solving assist level: Solves basic 75 - 89% of the time/requires cueing 10 - 24% of the time  Memory Memory assist level: Recognizes or recalls 90% of the time/requires cueing < 10% of the time   Medical Problem List and Plan: 1.  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  DVT Prophylaxis/Anticoagulation: Subcutaneous heparin. Monitor for any bleeding episodes. Pending vascular study 3. Pain Management: Hydrocodone as needed, Zanaflex 4 mg every 6 hours as needed 4. Mood/schizophrenia: Xanax 0.25 mg twice daily as needed 5. Neuropsych: This patient is capable of making decisions on his own behalf. 6. Skin/Wound Care: Routine skin checks 7. Fluids/Electrolytes/Nutrition: encourage PO    -sodium still 129 12/3--continue to monitor, recheck sodium tomorrow 8. Hypotension with bradycardia with history of HTN. Bradycardia felt to be chronic. Follow-up cardiology services no further planned workup. Echocardiogram unremarkable. Troponin negative. Monitor with increased mobility 9. Hyponatremia. See above 10. Dysphagia. Dysphagia #2 thin liquids.advance per speech therapy 11. UTI/neurogenic bladder: urine suspicious. Also complains of foley irritation    -. UCX + for proteus and enterococcus--augmentin per pharm recs    -I/O caths to keep volumes 300-500 cc, volume still too high    -phimosis reduced by urology yesterday. Need to keep foreskin retracted     LOS (Days) [place] T, MD 08/24/2017 8:38 AM

## 2017-08-24 NOTE — Progress Notes (Signed)
Physical Therapy Session Note  Patient Details  Name: Dean Oliver MRN: [id number] Date of Birth: 1946/08/06  Today's Date: 08/24/2017 PT Individual Time: 8882-8003 PT Individual Time Calculation (min): 90 min   Short Term Goals: Week 1:  PT Short Term Goal 1 (Week 1): Pt will perform rolling R/L with maxA +1 PT Short Term Goal 2 (Week 1): Pt will perform supine>sit maxA +1 PT Short Term Goal 3 (Week 1): Pt will perform lateral scoot transfer with maxA +1 PT Short Term Goal 4 (Week 1): Pt will demonstrate OOB tolerance 4 hours/day outside of therapy sessions  Skilled Therapeutic Interventions/Progress Updates: Pt received seated in w/c, denies pain and agreeable to treatment. Performed sit <>stand maxA x3 reps; decreased initiation through LEs compared to yesterday. Max cueing required to facilitate full upright standing with hip/trunk extension. Prior to performing standing frame vitals assessed 129/67; upright standing with TEDs in standing frame x2 min while assessing BP pt with symptomatic orthostatic hypotension BP 88/44. Returned pt to sitting and BP normalized in <2 min. B ace wraps donned to BLE; BP 114/51. Pt returned to standing, BP dropped to 77/49 and pt returned to sitting. Transfer w/c <>mat table with maxA squat pivot. BUE propped elbow sidelying for weight bearing and proximal shoulder stability 2x30 sec each side, limited by pain worse on R side. Sit <>supine totalA. Performed soft tissue massage to overactive and painful upper/middle traps and levator scapulae. Returned to w/c as above. Pt's cousin Dean Oliver arrived and pt agreeable to therapist updating family member on progress. Educated family member on goals of therapy sessions, progress and current functional level. Pt able to demo use of soft touch call bell under L elbow. Remained reclined in w/c at end of session, family present and all needs in reach.      Therapy Documentation Precautions:  Precautions Precautions: Fall,  Cervical Required Braces or Orthoses: Cervical Brace Cervical Brace: Hard collar, Other (comment)(okay to don in sitting) Restrictions Weight Bearing Restrictions: No Pain: Pain Assessment Pain Assessment: No/denies pain   See Function Navigator for Current Functional Status.   Therapy/Group: Individual Therapy  Luberta Mutter 08/24/2017, 3:28 PM

## 2017-08-24 NOTE — Progress Notes (Signed)
Social Work Patient ID: Dean Oliver, male   DOB: 1946/09/12, 71 y.o.   MRN: [id number]   Have reviewed team conference with pt's cousin/ guardian today who is aware of estimate LOS on CIR at 3-4 weeks and goals of mod assist and establishment of a b/b program prior to SNF transfer.  She denies any concerns or questions at this time.  Will continue to follow.  Alaia Lordi, LCSW

## 2017-08-24 NOTE — Progress Notes (Signed)
Social Work Patient ID: Dean Oliver, male   DOB: 1945-12-01, 71 y.o.   MRN: [id number]    Rexene Alberts  Social Worker  General Practice  Patient Care Conference  Signed  Date of Service:  08/24/2017  3:54 PM          Signed          [] Hide copied text  [] Hover for details   Inpatient RehabilitationTeam Conference and Plan of Care Update Date: 08/23/2017   Time: 2:10 PM      Patient Name: Dean Oliver      Medical Record Number: [id number]  Date of Birth: May 23, 1946 Sex: Male         Room/Bed: 4W08C/4W08C-01 Payor Info: Payor: MEDICARE / Plan: MEDICARE PART B / Product Type: *No Product type* /     Admitting Diagnosis: Hypotension  Admit Date/Time:  08/17/2017  4:17 PM Admission Comments: No comment available    Primary Diagnosis:  Central cord syndrome ([place]) Principal Problem: Central cord syndrome [facility])       Patient Active Problem List    Diagnosis Date Noted  . Neurogenic bladder 08/18/2017  . Neurogenic bowel 08/18/2017  . Bacterial UTI 08/18/2017  . Central cord syndrome ([place]) 08/17/2017  . Dysphagia    . Fall    . Hypotension    . Sinus bradycardia    . HNP (herniated nucleus pulposus) with myelopathy, cervical    . Benign essential HTN    . Hyponatremia    . Acute blood loss anemia    . Symptomatic bradycardia 08/12/2017  . [name]        Expected Discharge Date: Expected Discharge Date: (SNF)   Team Members Present: Physician leading conference: Dr. Alger Simons Social Worker Present: Lennart Pall, LCSW Nurse Present: Isla Pence, RN PT Present: Kem Parkinson, PT OT Present: Willeen Cass, OT;Roanna Epley, COTA SLP Present: Charolett Bumpers, SLP PPS Coordinator present : Ileana Ladd, PT       Current Status/Progress Goal Weekly Team Focus  Medical     Cervical central cord syndrome with tetraplegia, early spasticity.  Patient with neurogenic bowel and bladder as well.  Improve functional use of arms and legs.   Bladder management, treating urinary tract infection, heart rate and blood pressure control, pain management   Bowel/Bladder     requires I & O cath Q 6-8 hours for relief  maintain bladder free from infections      Swallow/Nutrition/ Hydration     dys 3 and thin liquids, Min- Supervision A pocketing and removing food  supervision  skilled observation during meal, carryover strategies for swallowing   ADL's     bed mobility-max A; sitting balance-max A/mod A; tot A for self care  mod A overall  activity tolerance, bed mobility, sitting balance, BADL retraining   Mobility     totalA bed mobility and transfers, +2 sit <>stand, min guard sitting balance  modA bed mobility, transfers, modA standing balance  activity tolerance, participation in therapy, bed mobility, transfers, sitting balance   Communication     clear verbal speech  maintain clear communication      Safety/Cognition/ Behavioral Observations   alert and oriented without noted behaviors  maintain cognition to meet needs      Pain     denies any pain   maintian adequate comfort      Skin     intact  maintain skin free from breakdowns  Rehab Goals Patient on target to meet rehab goals: Yes *See Care Plan and progress notes for long and short-term goals.      Barriers to Discharge   Current Status/Progress Possible Resolutions Date Resolved   Physician     Medical stability;Neurogenic Bowel & Bladder        Ongoing rehabilitation efforts.  Treating UTI.  May need to go home with a Foley catheter unless a caregiver is able to perform caths      Nursing                 PT                    OT                 SLP            SW              Discharge Planning/Teaching Needs:  Plan for pt to d/c to at SNF when medically cleared to do so.  NA   Team Discussion:  Pt with central cord injury and baseline cognitive deficits; difficult to understand.  Awaiting in put from urology.  Need to address bowel issues  and establish a good b/b program.  Total assistance overall;  Max tone.  amb 7' with 2 max assist today.  Trial of tilt-in-space w/c.  Pt is pocketing food but overall with good intake.  Mod assist goals overall.  Revisions to Treatment Plan:       Continued Need for Acute Rehabilitation Level of Care: The patient requires daily medical management by a physician with specialized training in physical medicine and rehabilitation for the following conditions: Daily direction of a multidisciplinary physical rehabilitation program to ensure safe treatment while eliciting the highest outcome that is of practical value to the patient.: Yes Daily medical management of patient stability for increased activity during participation in an intensive rehabilitation regime.: Yes Daily analysis of laboratory values and/or radiology reports with any subsequent need for medication adjustment of medical intervention for : Neurological problems;Urological problems   Waylen Depaolo 08/24/2017, 3:54 PM

## 2017-08-24 NOTE — Progress Notes (Addendum)
Speech Language Pathology Daily Session Note  Patient Details  Name: Dean Oliver MRN: [id number] Date of Birth: [birth year]  Today's Date: 08/24/2017 SLP Individual Time: 1100-1140 SLP Individual Time Calculation (min): 40 min  Short Term Goals: Week 1: SLP Short Term Goal 1 (Week 1): STG=LTG due to ELOS for ST follow up   Skilled Therapeutic Interventions: Skilled ST services focused on swallow skills. SLP positioned pt in chair at 90 degrees for optimal PO consumption. Pt was unable to recall swallow strategies independently, however given max verbal cues demonstrated recall. SLP facilitated PO consumption feeding pt dys 3 and thin liquid via straw, no overt s/s aspiration, however moderate pocketing on right side unable to clear with liquids wash. SLP instructed pt to perform lingual sweeps and swallow bolus, pt spit it out and stated " I cant swallow I don't have any teeth."SLP educated pt on swallow function verse texture of foods. Pt agreed to try different consistency of food, dys 2, however when presented refused stated " I am done eating. Don't you ask me again." Session was ended early due to refusal to continue participation. Pt was left in chair with call bell beside head. Recommend to continue skilled ST services in order to determine least restrictive diet.     Function:  Eating Eating   Modified Consistency Diet: Yes Eating Assist Level: Helper feeds patient;Supervision or verbal cues;Helper checks for pocketed food           Cognition Comprehension Comprehension assist level: Understands basic 75 - 89% of the time/ requires cueing 10 - 24% of the time  Expression   Expression assist level: Expresses basic 75 - 89% of the time/requires cueing 10 - 24% of the time. Needs helper to occlude trach/needs to repeat words.  Social Interaction Social Interaction assist level: Interacts appropriately 90% of the time - Needs monitoring or encouragement for participation or  interaction.  Problem Solving Problem solving assist level: Solves basic 75 - 89% of the time/requires cueing 10 - 24% of the time  Memory Memory assist level: Recognizes or recalls 90% of the time/requires cueing < 10% of the time    Pain Pain Assessment Pain Assessment: No/denies pain  Therapy/Group: Individual Therapy  Amour Trigg  [facility] 08/24/2017, 12:06 PM

## 2017-08-24 NOTE — Patient Care Conference (Signed)
Inpatient RehabilitationTeam Conference and Plan of Care Update Date: 08/23/2017   Time: 2:10 PM    Patient Name: Dean Oliver      Medical Record Number: [id number]  Date of Birth: [birth year] Sex: Male         Room/Bed: 4W08C/4W08C-01 Payor Info: Payor: MEDICARE / Plan: MEDICARE PART B / Product Type: *No Product type* /    Admitting Diagnosis: Hypotension  Admit Date/Time:  08/17/2017  4:17 PM Admission Comments: No comment available   Primary Diagnosis:  Central cord syndrome ([place]) Principal Problem: Central cord syndrome [facility])  Patient Active Problem List   Diagnosis Date Noted  . Neurogenic bladder 08/18/2017  . Neurogenic bowel 08/18/2017  . Bacterial UTI 08/18/2017  . Central cord syndrome ([place]) 08/17/2017  . Dysphagia   . Fall   . Hypotension   . Sinus bradycardia   . HNP (herniated nucleus pulposus) with myelopathy, cervical   . Benign essential HTN   . Hyponatremia   . Acute blood loss anemia   . Symptomatic bradycardia 08/12/2017  . Schizophrenia [facility])     Expected Discharge Date: Expected Discharge Date: (SNF)  Team Members Present: Physician leading conference: Dr. Alger Simons Social Worker Present: Lennart Pall, LCSW Nurse Present: Isla Pence, RN PT Present: Kem Parkinson, PT OT Present: Willeen Cass, OT;Roanna Epley, COTA SLP Present: Charolett Bumpers, SLP PPS Coordinator present : Ileana Ladd, PT     Current Status/Progress Goal Weekly Team Focus  Medical   Cervical central cord syndrome with tetraplegia, early spasticity.  Patient with neurogenic bowel and bladder as well.  Improve functional use of arms and legs.  Bladder management, treating urinary tract infection, heart rate and blood pressure control, pain management   Bowel/Bladder   requires I & O cath Q 6-8 hours for relief  maintain bladder free from infections      Swallow/Nutrition/ Hydration   dys 3 and thin liquids, Min- Supervision A pocketing and removing food   supervision  skilled observation during meal, carryover strategies for swallowing   ADL's   bed mobility-max A; sitting balance-max A/mod A; tot A for self care  mod A overall  activity tolerance, bed mobility, sitting balance, BADL retraining   Mobility   totalA bed mobility and transfers, +2 sit <>stand, min guard sitting balance  modA bed mobility, transfers, modA standing balance  activity tolerance, participation in therapy, bed mobility, transfers, sitting balance   Communication   clear verbal speech  maintain clear communication      Safety/Cognition/ Behavioral Observations  alert and oriented without noted behaviors  maintain cognition to meet needs      Pain   denies any pain   maintian adequate comfort      Skin   intact  maintain skin free from breakdowns       Rehab Goals Patient on target to meet rehab goals: Yes *See Care Plan and progress notes for long and short-term goals.     Barriers to Discharge  Current Status/Progress Possible Resolutions Date Resolved   Physician    Medical stability;Neurogenic Bowel & Bladder        Ongoing rehabilitation efforts.  Treating UTI.  May need to go home with a Foley catheter unless a caregiver is able to perform caths      Nursing                  PT  OT                  SLP                SW                Discharge Planning/Teaching Needs:  Plan for pt to d/c to at SNF when medically cleared to do so.  NA   Team Discussion:  Pt with central cord injury and baseline cognitive deficits; difficult to understand.  Awaiting in put from urology.  Need to address bowel issues and establish a good b/b program.  Total assistance overall;  Max tone.  amb 7' with 2 max assist today.  Trial of tilt-in-space w/c.  Pt is pocketing food but overall with good intake.  Mod assist goals overall.  Revisions to Treatment Plan:      Continued Need for Acute Rehabilitation Level of Care: The patient requires daily  medical management by a physician with specialized training in physical medicine and rehabilitation for the following conditions: Daily direction of a multidisciplinary physical rehabilitation program to ensure safe treatment while eliciting the highest outcome that is of practical value to the patient.: Yes Daily medical management of patient stability for increased activity during participation in an intensive rehabilitation regime.: Yes Daily analysis of laboratory values and/or radiology reports with any subsequent need for medication adjustment of medical intervention for : Neurological problems;Urological problems  Abigial Newville 08/24/2017, 3:54 PM

## 2017-08-24 NOTE — Progress Notes (Signed)
Occupational Therapy Session Note  Patient Details  Name: Dean Oliver MRN: [id number] Date of Birth: 1946/07/23  Today's Date: 08/24/2017 OT Individual Time: 0900-0930 OT Individual Time Calculation (min): 30 min    Short Term Goals: Week 1:  OT Short Term Goal 1 (Week 1): Pt will transfer to toilet/ BSC with mod A +1 with LRAD OT Short Term Goal 2 (Week 1): Pt will sitting EOB with supervision in prep for ADL task  OT Short Term Goal 3 (Week 1): Pt will perform one grooming task with max A with HOH A OT Short Term Goal 4 (Week 1): Pt will perform rolling right / left in bed with mod A for LB dressing  OT Short Term Goal 5 (Week 1): Pt will perform sit to stand in prep for ADL task with mod A +2  Skilled Therapeutic Interventions/Progress Updates:    Pt received supine in bed, reporting need to change brief, agreeable to OT treatment session. Pt completed bed mobility rolling to L and R with total assist +2 for rolling and total assist for peri-care/brief change after BM. Pt then engaged in PROM to bil UEs while supine in bed of shoulder flexion 0-90*, elbow, forearm, wrist and digits;  increased tone noted with attempts at shoulder external rotation past neutral. Pt left supine in bed end of session with call bell and needs within reach.   Therapy Documentation Precautions:  Precautions Precautions: Fall, Cervical Required Braces or Orthoses: Cervical Brace Cervical Brace: Hard collar, Other (comment)(okay to don in sitting) Restrictions Weight Bearing Restrictions: No    Pain: Pain Assessment Pain Assessment: No/denies pain ADL: ADL ADL Comments: see functional navigator     See Function Navigator for Current Functional Status.   Therapy/Group: Individual Therapy  Raymondo Band 08/24/2017, 12:08 PM

## 2017-08-24 NOTE — Progress Notes (Signed)
Occupational Therapy Session Note  Patient Details  Name: Dean Oliver MRN: [id number] Date of Birth: 1946-09-10  Today's Date: 08/24/2017 OT Individual Time: 1000-1100 OT Individual Time Calculation (min): 60 min    Short Term Goals: Week 1:  OT Short Term Goal 1 (Week 1): Pt will transfer to toilet/ BSC with mod A +1 with LRAD OT Short Term Goal 2 (Week 1): Pt will sitting EOB with supervision in prep for ADL task  OT Short Term Goal 3 (Week 1): Pt will perform one grooming task with max A with HOH A OT Short Term Goal 4 (Week 1): Pt will perform rolling right / left in bed with mod A for LB dressing  OT Short Term Goal 5 (Week 1): Pt will perform sit to stand in prep for ADL task with mod A +2  Skilled Therapeutic Interventions/Progress Updates:    Pt resting in bed upon arrival.  Initial focus on bed mobility, sitting balance, and squat pivot transfer to w/c to facilitate dressing tasks.  Pt required tot A for dressing tasks.  Pt required max A for bed mobility and supine>sit EOB.  Pt maintained sitting balance with close supervision while shirt and C-collar donned.  Pt noted with ongoing tone in BUE internal rotators.  Pt performed squat pivot transfer to w/c with max A.  Pt follows one step commands consistently.    1:1 NMES applied to RUE wrist/finger extensors to facilitate wrist/finger extension  Ratio 1:3 Rate 35 pps Waveform- Asymmetric Ramp 1.0 Pulse 300 Intensity- 24 Duration - 10 min    Report of pain at the beginning of session  Report of pain at the end of session  No adverse reactions after treatment and is skin intact.Pt responded well to treatment.   Therapy Documentation Precautions:  Precautions Precautions: Fall, Cervical Required Braces or Orthoses: Cervical Brace Cervical Brace: Hard collar, Other (comment)(okay to don in sitting) Restrictions Weight Bearing Restrictions: No   Pain:  Pt denies pain  See Function Navigator for Current Functional  Status.   Therapy/Group: Individual Therapy  [name] 08/24/2017, 11:42 AM

## 2017-08-25 ENCOUNTER — Inpatient Hospital Stay (HOSPITAL_COMMUNITY): Payer: Medicare Other

## 2017-08-25 ENCOUNTER — Inpatient Hospital Stay (HOSPITAL_COMMUNITY): Payer: Medicare Other | Admitting: Physical Therapy

## 2017-08-25 ENCOUNTER — Inpatient Hospital Stay (HOSPITAL_COMMUNITY): Payer: Medicare Other | Admitting: Speech Pathology

## 2017-08-25 NOTE — Progress Notes (Signed)
Occupational Therapy Session Note  Patient Details  Name: Dean Oliver MRN: [id number] Date of Birth: Mar 04, 1946  Today's Date: 08/25/2017 OT Individual Time: 0800-0900 OT Individual Time Calculation (min): 60 min    Short Term Goals: Week 1:  OT Short Term Goal 1 (Week 1): Pt will transfer to toilet/ BSC with mod A +1 with LRAD OT Short Term Goal 1 - Progress (Week 1): Progressing toward goal OT Short Term Goal 2 (Week 1): Pt will sitting EOB with supervision in prep for ADL task  OT Short Term Goal 2 - Progress (Week 1): Met OT Short Term Goal 3 (Week 1): Pt will perform one grooming task with max A with HOH A OT Short Term Goal 3 - Progress (Week 1): Progressing toward goal OT Short Term Goal 4 (Week 1): Pt will perform rolling right / left in bed with mod A for LB dressing  OT Short Term Goal 4 - Progress (Week 1): Progressing toward goal OT Short Term Goal 5 (Week 1): Pt will perform sit to stand in prep for ADL task with mod A +2 OT Short Term Goal 5 - Progress (Week 1): Progressing toward goal Week 2:  OT Short Term Goal 1 (Week 2): Pt will transfer to toilet/ BSC with mod A +1 with LRAD OT Short Term Goal 2 (Week 2): Pt will perform one grooming task with max A with HOH A OT Short Term Goal 3 (Week 2): Pt will perform rolling right / left in bed with mod A for LB dressing  OT Short Term Goal 4 (Week 2): Pt will perform sit to stand in prep for ADL task with mod A +2  Skilled Therapeutic Interventions/Progress Updates:    Pt resting in bed upon arrival.  Pt stated that his legs hurt more this morning.  Pt noted with increased BLE and BUE tone this morning.  Pt commented that nursing had "shoved something up my bottom" and it hurt.  Pt incontinent of bowel and required tot A + 2 for hygiene and donning Ted hose, Ace wraps, and pants.  Pt able to bridge in bed with BLE supported by therapist.  Pt required tot A for supine>sit EOB but able to maintain sitting balance with supervision  and min verbal cues to correct balance.  Pt required tot A for UB dressing tasks and max A for squat pivot transfer to w/c. Attempted to perform BUE PROM but increased tone inhibited free movement of shoulder girdle.  Pt remained in w/c with soft call bell placed by head and TIS w/c in reclined position. Focus on following one step commands, bed mobility, sitting balance, functional transfers, and active participation in therapeutic tasks.   Therapy Documentation Precautions:  Precautions Precautions: Fall, Cervical Required Braces or Orthoses: Cervical Brace Cervical Brace: Hard collar, Other (comment)(okay to don in sitting) Restrictions Weight Bearing Restrictions: No  Pain: "My legs hurt"; increased tone noted See Function Navigator for Current Functional Status.   Therapy/Group: Individual Therapy  [name] 08/25/2017, 9:48 AM

## 2017-08-25 NOTE — Progress Notes (Signed)
Occupational Therapy Weekly Progress Note  Patient Details  Name: Dean Oliver MRN: [id number] Date of Birth: 02-07-1946  Beginning of progress report period: August 18, 2017 End of progress report period: August 25, 2017  Patient has met 1 of 5 short term goals.  Pt making slow progress with BADLs during the past week.  Pt follows one step commands when engaged in bed mobility tasks and is able to raise B knees to assist with rolling but continues to require max A + 2.  Pt continues to require tot A for dressing tasks and is currently on night bath schedule.  Pt is able to maintain static sitting balance EOB to facilitate UB dressing tasks.  Pt requires min verbal cues to correct sitting balance.  Pt requires max A for squat pivot transfers to w/c.  Pt responding to NMES for wrist/finger extension but unable to independently initiate movement in BUE.   Patient continues to demonstrate the following deficits: muscle weakness and muscle paralysis, decreased cardiorespiratoy endurance, abnormal tone and decreased coordination, decreased attention to left, decreased attention, decreased awareness, decreased problem solving, decreased safety awareness, decreased memory and delayed processing and decreased sitting balance, decreased standing balance, decreased postural control and decreased balance strategies and therefore will continue to benefit from skilled OT intervention to enhance overall performance with BADL and Reduce care partner burden.  Patient progressing toward long term goals..  Continue plan of care.  OT Short Term Goals Week 1:  OT Short Term Goal 1 (Week 1): Pt will transfer to toilet/ BSC with mod A +1 with LRAD OT Short Term Goal 1 - Progress (Week 1): Progressing toward goal OT Short Term Goal 2 (Week 1): Pt will sitting EOB with supervision in prep for ADL task  OT Short Term Goal 2 - Progress (Week 1): Met OT Short Term Goal 3 (Week 1): Pt will perform one grooming task with  max A with HOH A OT Short Term Goal 3 - Progress (Week 1): Progressing toward goal OT Short Term Goal 4 (Week 1): Pt will perform rolling right / left in bed with mod A for LB dressing  OT Short Term Goal 4 - Progress (Week 1): Progressing toward goal OT Short Term Goal 5 (Week 1): Pt will perform sit to stand in prep for ADL task with mod A +2 OT Short Term Goal 5 - Progress (Week 1): Progressing toward goal Week 2:  OT Short Term Goal 1 (Week 2): Pt will transfer to toilet/ BSC with mod A +1 with LRAD OT Short Term Goal 2 (Week 2): Pt will perform one grooming task with max A with HOH A OT Short Term Goal 3 (Week 2): Pt will perform rolling right / left in bed with mod A for LB dressing  OT Short Term Goal 4 (Week 2): Pt will perform sit to stand in prep for ADL task with mod A +2  Therapy Documentation Precautions:  Precautions Precautions: Fall, Cervical Required Braces or Orthoses: Cervical Brace Cervical Brace: Hard collar, Other (comment)(okay to don in sitting) Restrictions Weight Bearing Restrictions: No  See Function Navigator for Current Functional Status.    [name] [facility] 08/25/2017, 6:57 AM

## 2017-08-25 NOTE — Progress Notes (Signed)
Orthopedic Tech Progress Note Patient Details:  Dean Oliver 1946/03/14 [id number]  Ortho Devices Type of Ortho Device: Abdominal binder Ortho Device/Splint Interventions: Veleta Miners 08/25/2017, 2:09 PM

## 2017-08-25 NOTE — Progress Notes (Signed)
Speech Language Pathology Daily Session Note  Patient Details  Name: Dean Oliver MRN: [id number] Date of Birth: 1946-07-01  Today's Date: 08/25/2017 SLP Individual Time: 1445-1530 SLP Individual Time Calculation (min): 45 min  Short Term Goals: Week 1: SLP Short Term Goal 1 (Week 1): STG=LTG due to ELOS for ST follow up   Skilled Therapeutic Interventions: Skilled treatment session focused on dysphagia goals. SLP facilitated session by providing skilled observation of pt consuming dysphagia 3 lunch tray including chopped [place]. Pt's oral abilities appear at baseline which are c/b repetitive oral pattern including extensive chewing with 3 to 4 swallows per bolus regardless of puree vs. Dysphagia 3 textures. At the end of consumption, pt appeared disinterested and requested to spit out last bolus which appeared related to disinterest not oropharyngeal deficits. Recommend one additional trial of dysphagia 3 and if functional then discharge from [place]. Pt left upright in bed with nursing present.      Function:  Eating Eating   Modified Consistency Diet: Yes Eating Assist Level: Helper feeds patient;Supervision or verbal cues;Helper checks for pocketed food           Cognition Comprehension Comprehension assist level: Understands basic 75 - 89% of the time/ requires cueing 10 - 24% of the time  Expression   Expression assist level: Expresses basic 75 - 89% of the time/requires cueing 10 - 24% of the time. Needs helper to occlude trach/needs to repeat words.  Social Interaction Social Interaction assist level: Interacts appropriately 90% of the time - Needs monitoring or encouragement for participation or interaction.  Problem Solving Problem solving assist level: Solves basic 75 - 89% of the time/requires cueing 10 - 24% of the time  Memory Memory assist level: Recognizes or recalls 90% of the time/requires cueing < 10% of the time    Pain    Therapy/Group: Individual  Therapy  Suren Payne 08/25/2017, 3:54 PM

## 2017-08-25 NOTE — Progress Notes (Signed)
Occupational Therapy Note  Patient Details  Name: Dean Oliver MRN: [id number] Date of Birth: 04-Feb-1946  Today's Date: 08/25/2017 OT Missed Time: 56 Minutes Missed Time Reason: Patient fatigue;Patient unwilling/refused to participate without medical reason  Pt missed 45 mins skilled OT services.  Pt asleep upon arrival and required max multimodal cues to arouse.  Pt stated he wasn't going to "do any more" today.  Attempted to encourage pt to participate in BUE therex/stretching but pt insistent that he wasn't going to "do any more." Pt remained in bed with call bell beside head.    [name] [facility] 08/25/2017, 1:30 PM

## 2017-08-25 NOTE — Progress Notes (Signed)
[facility] PHYSICAL MEDICINE & REHABILITATION     PROGRESS NOTE    Subjective/Complaints: No new problems overnight.  Patient resting comfortably.  ROS: Limited due to cognitive/behavioral   Objective: Vital Signs: Blood pressure (!) 160/75, pulse 60, temperature 98 F (36.7 C), temperature source Oral, resp. rate 18, height 5' 9.5" (1.765 m), weight 72.7 kg (160 lb 4.4 oz), SpO2 100 %. No results found. No results for input(s): WBC, HGB, HCT, PLT in the last 72 hours. No results for input(s): NA, K, CL, GLUCOSE, BUN, CREATININE, CALCIUM in the last 72 hours.  Invalid input(s): CO CBG (last 3)  No results for input(s): GLUCAP in the last 72 hours.  Wt Readings from Last 3 Encounters:  08/18/17 72.7 kg (160 lb 4.4 oz)    Physical Exam:  Head: Normocephalic and atraumatic.  Eyes: Right eye exhibits no discharge. Left eye exhibits no discharge. No scleral icterus.  Neck: Normal range of motion. Neck supple. No thyromegaly present.  Oral: edentulous Cardiovascular: brady Respiratory:CT-scan of the abdomen i  GI: Soft. Bowel sounds are normal. He exhibits no distension.  Musculoskeletal: He exhibits no edema or tenderness.  Neurological: He is alert.  Patient provides his name and age.  Remains. Severely dysarthric A&Ox3 Right lean Motor: B/l UE 0/5 proximal to distal--no change B/l LE: HF 2 to 2+/5, KE 2+/5, distally 1-2/5. Trace resting tone. ?HAD Skin: Skin is hot, a few chronic changes Uro: foreskin now reduced  Psychiatric: a little anxious    Assessment/Plan: 1. 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 Physiatrist is providing close team supervision and 24 hour management of active medical problems listed below. Physiatrist and rehab team continue to assess barriers to discharge/monitor patient progress toward functional and  medical goals.  Function:  Bathing Bathing position   Position: Wheelchair/chair at sink  Bathing parts   Body parts bathed by helper: Right arm, Left arm, Chest, Abdomen, Front perineal area, Buttocks, Right upper leg, Left upper leg, Left lower leg, Back, Right lower leg  Bathing assist Assist Level: (total assist)      Upper Body Dressing/Undressing Upper body dressing   What is the patient wearing?: Pull over shirt/dress       Pull over shirt/dress - Perfomed by helper: Thread/unthread right sleeve, Thread/unthread left sleeve, Put head through opening, Pull shirt over trunk        Upper body assist Assist Level: (total assist)      Lower Body Dressing/Undressing Lower body dressing   What is the patient wearing?: Underwear, Pants, Non-skid slipper socks, Ted Hose   Underwear - Performed by helper: Thread/unthread left underwear leg, Thread/unthread right underwear leg, Pull underwear up/down   Pants- Performed by helper: Thread/unthread right pants leg, Thread/unthread left pants leg, Pull pants up/down   Non-skid slipper socks- Performed by helper: Don/doff right sock, Don/doff left sock               TED Hose - Performed by helper: Don/doff right TED hose, Don/doff left TED hose  Lower body assist Assist for lower body dressing: (total assist)      Toileting Toileting Toileting activity did not occur: No continent bowel/bladder event   Toileting steps completed by helper: Adjust clothing prior to toileting, Performs perineal hygiene, Adjust clothing after toileting    Toileting assist Assist level: Two helpers   Transfers Chair/bed transfer   Chair/bed transfer method:  Squat pivot Chair/bed transfer assist level: Maximal assist (Pt 25 - 49%/lift and lower) Chair/bed transfer assistive device: Sliding board     Locomotion Ambulation Ambulation activity did not occur: Safety/medical concerns   Max distance: 7' Assist level: 2 helpers   Automotive engineer activity did not occur: Safety/medical concerns Type: Manual(quadriparesis)      Cognition Comprehension Comprehension assist level: Understands basic 75 - 89% of the time/ requires cueing 10 - 24% of the time  Expression Expression assist level: Expresses basic 75 - 89% of the time/requires cueing 10 - 24% of the time. Needs helper to occlude trach/needs to repeat words.  Social Interaction Social Interaction assist level: Interacts appropriately 90% of the time - Needs monitoring or encouragement for participation or interaction.  Problem Solving Problem solving assist level: Solves basic 75 - 89% of the time/requires cueing 10 - 24% of the time  Memory Memory assist level: Recognizes or recalls 90% of the time/requires cueing < 10% of the time   Medical Problem List and Plan: 1.  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  DVT Prophylaxis/Anticoagulation: Subcutaneous heparin. Monitor for any bleeding episodes. Pending vascular study 3. Pain Management: Hydrocodone as needed, Zanaflex 4 mg every 6 hours as needed 4. Mood/schizophrenia: Xanax 0.25 mg twice daily as needed 5. Neuropsych: This patient is capable of making decisions on his own behalf. 6. Skin/Wound Care: Routine skin checks 7. Fluids/Electrolytes/Nutrition: encourage PO    -sodium still 129 12/3--level pending for today 8. Hypotension with bradycardia with history of HTN. Bradycardia felt to be chronic. Follow-up cardiology services no further planned workup. Echocardiogram unremarkable. Troponin negative. Monitor with increased mobility 9. Hyponatremia. See above 10. Dysphagia. Dysphagia #2 thin liquids.advance per speech therapy 11. UTI/neurogenic bladder: urine suspicious. Also complains of foley irritation    -. UCX + for proteus and enterococcus--augmentin per pharm recs    -I/O caths to keep volumes 300-500 cc, volume still too high    -phimosis reduced by urology. Need to keep foreskin retracted 12.  Neurogenic bowel: Working towards bowel program with daily suppository     LOS (Days) 8 A [place] T, MD 08/25/2017 6:52 AM

## 2017-08-25 NOTE — Progress Notes (Signed)
Physical Therapy Weekly Progress Note  Patient Details  Name: Dean Oliver MRN: [id number] Date of Birth: [birth year]  Beginning of progress report period: August 18, 2017 End of progress report period: August 25, 2017  Today's Date: 08/25/2017 PT Individual Time:  10:00-11:00 PT Minutes: 60 minutes    Patient has met 0 of 4 short term goals.  Pt is slowly progressing towards all short term goals. Pt is currently Max Assist to Total Assist for bed mobility (rolling, supine to/from sit) and transfers (squat pivot, sliding board) depending on pt fatigue level. Gait training has been initiated, however non-functional and requires maxA +2 due to significant extensor/adductor tone and spasticity. Standing and gait limited by orthostasis as well. Pt is tolerating being up in his w/c for about about two hours at a time before fatiguing and requesting to lay down.    Patient continues to demonstrate the following deficits muscle weakness, muscle joint tightness, muscle paralysis, decreased cardiorespiratoy endurance, impaired timing and sequencing, abnormal tone, and unbalanced muscle activation and therefore will continue to benefit from skilled PT intervention to increase functional independence with mobility.  Patient progressing toward long term goals..  Continue plan of care.  PT Short Term Goals Week 1:  PT Short Term Goal 1 (Week 1): Pt will perform rolling R/L with maxA +1 PT Short Term Goal 1 - Progress (Week 1): Progressing toward goal PT Short Term Goal 2 (Week 1): Pt will perform supine>sit maxA +1 PT Short Term Goal 2 - Progress (Week 1): Progressing toward goal PT Short Term Goal 3 (Week 1): Pt will perform lateral scoot transfer with maxA +1 PT Short Term Goal 3 - Progress (Week 1): Progressing toward goal PT Short Term Goal 4 (Week 1): Pt will demonstrate OOB tolerance 4 hours/day outside of therapy sessions PT Short Term Goal 4 - Progress (Week 1): Progressing toward goal Week  2:  PT Short Term Goal 1 (Week 2): Pt will perform rolling L/R with maxA +1 consistently PT Short Term Goal 2 (Week 2): Pt will perform supine>sit maxA + 1 consistently PT Short Term Goal 3 (Week 2): Pt will perform lateral scoot transfer with maxA +1 consistently PT Short Term Goal 4 (Week 2): Pt will demonstrate OOB tolerance 4 hours/day outside of therapy sessions  Skilled Therapeutic Interventions/Progress Updates:  Pt seated in w/c in room upon therapist arrival. Pt reports that his pain is improved today, however pain not rated. BP 93/59 while sitting upright and pt reports feeling "off". Squat pivot transfer w/c to bed total assist. Unsure if pt exhibits decreased assist with transfer due to fatigue or poor motor planning; v/c required for weight shift and transfer technique. Sit to supine total assist x 2. Lateral transfer bed to/from tilt table total assist x 2. Tilt table progression from 0 degrees to 30 degrees. BP at 0 degrees 146/75, at 30 degrees 149/79. Pt remains asymptomatic throughout therapy session and tolerates tilt table for 15 minutes. Focus on weight-bearing through BLE and BUE while on tilt table to increase input through joint receptors and UE/LE muscles for neuromuscular re-education. Pt engages in cognitive tasks while on tilt table, naming musical artists he enjoys and conversing minimally with therapists. Requires encouragement to continue participation and education regarding purpose of activity d/t frequent requests to return to bed. Pt left supine in bed with call bell under L elbow.  Therapy Documentation Precautions:  Precautions Precautions: Fall, Cervical Required Braces or Orthoses: Cervical Brace Cervical Brace: Hard collar, Other (comment)(okay  to don in sitting) Restrictions Weight Bearing Restrictions: No   Vital Signs: Therapy Vitals Patient Position (if appropriate): Orthostatic Vitals     See Function Navigator for Current Functional  Status.  Therapy/Group: Individual Therapy  Excell Seltzer, PT, DPT  08/25/2017, 12:16 PM

## 2017-08-25 NOTE — Progress Notes (Addendum)
Physical Therapy Note  Patient Details  Name: Dean Oliver MRN: [id number] Date of Birth: 1946/02/20 Today's Date: 08/25/2017    Pt's plan of care adjusted to 15/7 after speaking with care team and discussed with PA/MD as pt currently unable to tolerate current therapy schedule with OT, PT, and SLP.     [name] 08/25/2017, 2:44 PM

## 2017-08-26 ENCOUNTER — Inpatient Hospital Stay (HOSPITAL_COMMUNITY): Payer: Medicare Other | Admitting: Physical Therapy

## 2017-08-26 ENCOUNTER — Inpatient Hospital Stay (HOSPITAL_COMMUNITY): Payer: Medicare Other

## 2017-08-26 ENCOUNTER — Inpatient Hospital Stay (HOSPITAL_COMMUNITY): Payer: Medicare Other | Admitting: Speech Pathology

## 2017-08-26 LAB — BASIC METABOLIC PANEL
Anion gap: 8 (ref 5–15)
BUN: 8 mg/dL (ref 6–20)
CHLORIDE: 97 mmol/L — AB (ref 101–111)
CO2: 25 mmol/L (ref 22–32)
CREATININE: 0.67 mg/dL (ref 0.61–1.24)
Calcium: 8.4 mg/dL — ABNORMAL LOW (ref 8.9–10.3)
GFR calc non Af Amer: >60 mL/min (ref >60)
Glucose, Bld: 137 mg/dL — ABNORMAL HIGH (ref 65–99)
POTASSIUM: 3.5 mmol/L (ref 3.5–5.1)
SODIUM: 130 mmol/L — AB (ref 135–145)

## 2017-08-26 LAB — CBC
HCT: 33.4 % — ABNORMAL LOW (ref 39.0–52.0)
Hemoglobin: 10.6 g/dL — ABNORMAL LOW (ref 13.0–17.0)
MCH: 27 pg (ref 26.0–34.0)
MCHC: 31.7 g/dL (ref 30.0–36.0)
MCV: 85 fL (ref 78.0–100.0)
PLATELETS: 369 10*3/uL (ref 150–400)
RBC: 3.93 MIL/uL — AB (ref 4.22–5.81)
RDW: 12.2 % (ref 11.5–15.5)
WBC: 7.4 10*3/uL (ref 4.0–10.5)

## 2017-08-26 NOTE — Progress Notes (Signed)
Physical Therapy Session Note  Patient Details  Name: Dean Oliver MRN: [id number] Date of Birth: 1946/02/28  Today's Date: 08/26/2017 PT Individual Time: 1000-1100 PT Individual Time Calculation (min): 60 min   Short Term Goals: Week 2:  PT Short Term Goal 1 (Week 2): Pt will perform rolling L/R with maxA +1 consistently PT Short Term Goal 2 (Week 2): Pt will perform supine>sit maxA + 1 consistently PT Short Term Goal 3 (Week 2): Pt will perform lateral scoot transfer with maxA +1 consistently PT Short Term Goal 4 (Week 2): Pt will demonstrate OOB tolerance 4 hours/day outside of therapy sessions  Skilled Therapeutic Interventions/Progress Updates: Pt received seated in recliner, denies pain and agreeable to treatment. Pt more talkative during session, motivated to participate. Sit <>stand in parallel bars modA x2 trials; unable to maintain UE placement on bars with dycem d/t tone. Stand pivot transfer w/c >mat table with maxA: on initial attempt pt with sudden movement of LEs with extensor tone kicking in and requiring totalA +2 to return safely to chair. On second trial, performed maxA +2 with small steps and slow speed, +2 available for safety. Sit <>stand with eva walker x3 trials; attempted small steps forward/backward however pt with forward trunk lean, difficulty maintaining center of gravity over feet and gait not attempted for safety. Sit <>stand in [place] plus with minA +2 for positioning and cueing for technique. Able to demo standing in sara plus x3 min with no report of orthostatic symptoms. Pt with incontinent bowel movement on last standing attempt. Returned to w/c modA squat pivot, and w/c >bed modA squat pivot with improving activation of LEs and facilitation to maintain anterior weight shift. TotalA sit >supine. NT alerted to incontinent bowel movement and need for brief change. All needs in reach at end of session.      Therapy Documentation Precautions:   Precautions Precautions: Fall, Cervical Required Braces or Orthoses: Cervical Brace Cervical Brace: Hard collar, Other (comment)(okay to don in sitting) Restrictions Weight Bearing Restrictions: No   See Function Navigator for Current Functional Status.   Therapy/Group: Individual Therapy  Luberta Mutter 08/26/2017, 2:42 PM

## 2017-08-26 NOTE — Progress Notes (Signed)
[place] PHYSICAL MEDICINE & REHABILITATION     PROGRESS NOTE    Subjective/Complaints: No new problems overnight.  Patient resting comfortably.  ROS: Limited due to cognitive/behavioral   Objective: Vital Signs: Blood pressure (!) 166/80, pulse (!) 50, temperature 99.9 F (37.7 C), temperature source Oral, resp. rate 20, height 5' 9.5" (1.765 m), weight 72.7 kg (160 lb 4.4 oz), SpO2 99 %. No results found. No results for input(s): WBC, HGB, HCT, PLT in the last 72 hours. No results for input(s): NA, K, CL, GLUCOSE, BUN, CREATININE, CALCIUM in the last 72 hours.  Invalid input(s): CO CBG (last 3)  No results for input(s): GLUCAP in the last 72 hours.  Wt Readings from Last 3 Encounters:  08/18/17 72.7 kg (160 lb 4.4 oz)    Physical Exam:  Head: Normocephalic and atraumatic.  Eyes: Right eye exhibits no discharge. Left eye exhibits no discharge. No scleral icterus.  Neck: Normal range of motion. Neck supple. No thyromegaly present.  Oral: edentulous Cardiovascular: brady Respiratory:CT-scan of the abdomen i  GI: Soft. Bowel sounds are normal. He exhibits no distension.  Musculoskeletal: He exhibits no edema or tenderness.  Neurological: He is alert.  Patient provides his name and age.  Remains. Severely dysarthric A&Ox3 Right lean Motor: B/l UE 0/5 proximal to distal--no change B/l LE: HF 2 to 2+/5, KE 2+/5, distally 1-2/5. Trace resting tone. ?HAD Skin: Skin is hot, a few chronic changes Uro: foreskin now reduced  Psychiatric: a little anxious    Assessment/Plan: 1. 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 Physiatrist is providing close team supervision and 24 hour management of active medical problems listed below. Physiatrist and rehab team continue to assess barriers to discharge/monitor patient progress toward functional  and medical goals.  Function:  Bathing Bathing position   Position: Wheelchair/chair at sink  Bathing parts   Body parts bathed by helper: Right arm, Left arm, Chest, Abdomen, Front perineal area, Buttocks, Right upper leg, Left upper leg, Left lower leg, Back, Right lower leg  Bathing assist Assist Level: (total assist)      Upper Body Dressing/Undressing Upper body dressing   What is the patient wearing?: Pull over shirt/dress       Pull over shirt/dress - Perfomed by helper: Thread/unthread right sleeve, Thread/unthread left sleeve, Put head through opening, Pull shirt over trunk        Upper body assist Assist Level: (total assist)      Lower Body Dressing/Undressing Lower body dressing   What is the patient wearing?: Underwear, Pants, Non-skid slipper socks, Ted Hose   Underwear - Performed by helper: Thread/unthread left underwear leg, Thread/unthread right underwear leg, Pull underwear up/down   Pants- Performed by helper: Thread/unthread right pants leg, Thread/unthread left pants leg, Pull pants up/down   Non-skid slipper socks- Performed by helper: Don/doff right sock, Don/doff left sock               TED Hose - Performed by helper: Don/doff right TED hose, Don/doff left TED hose  Lower body assist Assist for lower body dressing: (total assist)      Toileting Toileting Toileting activity did not occur: No continent bowel/bladder event   Toileting steps completed by helper: Adjust clothing prior to toileting, Performs perineal hygiene, Adjust clothing after toileting    Toileting assist Assist level: Two helpers   Transfers Chair/bed transfer   Chair/bed transfer  method: Squat pivot Chair/bed transfer assist level: Total assist (Pt < 25%) Chair/bed transfer assistive device: Sliding board     Locomotion Ambulation Ambulation activity did not occur: Safety/medical concerns   Max distance: 7' Assist level: 2 helpers   Oceanographer  activity did not occur: Safety/medical concerns Type: Manual(quadriparesis)      Cognition Comprehension Comprehension assist level: Understands basic 75 - 89% of the time/ requires cueing 10 - 24% of the time  Expression Expression assist level: Expresses basic 75 - 89% of the time/requires cueing 10 - 24% of the time. Needs helper to occlude trach/needs to repeat words.  Social Interaction Social Interaction assist level: Interacts appropriately 90% of the time - Needs monitoring or encouragement for participation or interaction.  Problem Solving Problem solving assist level: Solves basic 75 - 89% of the time/requires cueing 10 - 24% of the time  Memory Memory assist level: Recognizes or recalls 90% of the time/requires cueing < 10% of the time   Medical Problem List and Plan: 1.  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  DVT Prophylaxis/Anticoagulation: Subcutaneous heparin. Monitor for any bleeding episodes. Pending vascular study 3. Pain Management: Hydrocodone as needed, Zanaflex 4 mg every 6 hours as needed   -Continue low-dose baclofen to help with extensor and adductor tone 4. Mood/schizophrenia: Xanax 0.25 mg twice daily as needed 5. Neuropsych: This patient is capable of making decisions on his own behalf. 6. Skin/Wound Care: Routine skin checks 7. Fluids/Electrolytes/Nutrition: encourage PO    -sodium still 129 12/3--recheck labs today 8. Hypotension with bradycardia with history of HTN. Bradycardia felt to be chronic. Follow-up cardiology services no further planned workup. Echocardiogram unremarkable. Troponin negative. Monitor with increased mobility 9. Hyponatremia. See above 10. Dysphagia. Dysphagia #2 thin liquids.advance per speech therapy 11. UTI/neurogenic bladder: urine  suspicious. Also complains of foley irritation    -. UCX + for proteus and enterococcus--augmentin per pharm recs   -I/O caths to keep volumes 300-500 cc, volume still too high    -phimosis reduced by urology. Need to keep foreskin retracted 12.  Neurogenic bowel: Working on bowel program with daily suppository--goal for am     LOS (Days) [employer] T, MD 08/26/2017 8:47 AM

## 2017-08-26 NOTE — Progress Notes (Signed)
Occupational Therapy Note  Patient Details  Name: Dean Oliver MRN: [id number] Date of Birth: 1945/10/01  Today's Date: 08/26/2017 OT Individual Time: 1300-1325 OT Individual Time Calculation (min): 25 min   Pt c/o soreness in BLE Individual Therapy  Pt resting in bed upon arrival, listening to music on computer.  Pt commented that he wanted to listen to something else and music channel changed.  Pt declined to get OOB but agrreable to BUE therex/PROM.  Pt continues to exhibit significant tone restricting external rotation.  Soft tissue mobilizaions/myofascial release provided with relief.     [name] [facility] 08/26/2017, 2:40 PM

## 2017-08-26 NOTE — Progress Notes (Signed)
Occupational Therapy Session Note  Patient Details  Name: Dean Oliver MRN: [id number] Date of Birth: 1945-10-30  Today's Date: 08/26/2017 OT Individual Time: 0800-0900 OT Individual Time Calculation (min): 60 min    Short Term Goals: Week 2:  OT Short Term Goal 1 (Week 2): Pt will transfer to toilet/ BSC with mod A +1 with LRAD OT Short Term Goal 2 (Week 2): Pt will perform one grooming task with max A with HOH A OT Short Term Goal 3 (Week 2): Pt will perform rolling right / left in bed with mod A for LB dressing  OT Short Term Goal 4 (Week 2): Pt will perform sit to stand in prep for ADL task with mod A +2  Skilled Therapeutic Interventions/Progress Updates:    Pt resting in bed upon arrival asking for breakfast but agreeable to getting dressed and OOB before eating.  Pt required tot A for LB dressing at bed level, bed mobility, and UB dressing tasks.  Pt able to sit EOB with supervision and min verbal cues to correct balance.  Pt performed squat pivot transfer to w/c with max A.  Pt stood from w/c with min A + 2 for safety.  Pt required tot A for self feeding but directed care appropriately and independently.   Therapy Documentation Precautions:  Precautions Precautions: Fall, Cervical Required Braces or Orthoses: Cervical Brace Cervical Brace: Hard collar, Other (comment)(okay to don in sitting) Restrictions Weight Bearing Restrictions: No  Pain: Pt c/o BLE muscle "soreness"; RN aware  See Function Navigator for Current Functional Status.   Therapy/Group: Individual Therapy  [name] 08/26/2017, 9:06 AM

## 2017-08-26 NOTE — Progress Notes (Signed)
Speech Language Pathology Discharge Summary  Patient Details  Name: Dean Oliver MRN: [id number] Date of Birth: 02-04-1946  Today's Date: 08/26/2017 SLP Individual Time: 1400-1430 SLP Individual Time Calculation (min): 30 min   Skilled Therapeutic Interventions:  Skilled treatment session focused on dysphagia goals. SLP facilitated session by providing skilled observation of pt consuming dysphagia 3 lunch with no bread d/t nursing feedback that bread is "difficult" and requires more oral prep time. Pt consumed dysphagia 3 lunch tray without overt s/s of aspiration. Pt is appropriate for dysphagia 3 but is not a candidate for further diet upgrade d/t baseline oropharyngeal deficits. Therefore pt is appropriate for discharge from skilled ST. No follow up services are indicated at this time.      Patient has met 3 of 3 long term goals.  Patient to discharge at overall Supervision level.   Clinical Impression/Discharge Summary:   Pt has made good progress in skilled ST sessions and has met 3 of 3 LTGs. Pt consumed dysphagia 3 without overt s/s of aspiration. No further services are indicated.   Care Partner:        Recommendation:  None      Equipment:     Reasons for discharge: Treatment goals met   Patient/Family Agrees with Progress Made and Goals Achieved: Yes   Function:  Eating Eating   Modified Consistency Diet: Yes Eating Assist Level: Helper feeds patient;Supervision or verbal cues;Helper checks for pocketed food           Cognition Comprehension Comprehension assist level: Understands basic 75 - 89% of the time/ requires cueing 10 - 24% of the time  Expression   Expression assist level: Expresses basic 75 - 89% of the time/requires cueing 10 - 24% of the time. Needs helper to occlude trach/needs to repeat words.  Social Interaction Social Interaction assist level: Interacts appropriately 90% of the time - Needs monitoring or encouragement for participation or  interaction.  Problem Solving Problem solving assist level: Solves basic 50 - 74% of the time/requires cueing 25 - 49% of the time;Solves basic 25 - 49% of the time - needs direction more than half the time to initiate, plan or complete simple activities  Memory Memory assist level: Recognizes or recalls 75 - 89% of the time/requires cueing 10 - 24% of the time   Odester Nilson 08/26/2017, 2:36 PM

## 2017-08-27 ENCOUNTER — Inpatient Hospital Stay (HOSPITAL_COMMUNITY): Payer: Medicare Other | Admitting: Physical Therapy

## 2017-08-27 ENCOUNTER — Inpatient Hospital Stay (HOSPITAL_COMMUNITY): Payer: Medicare Other

## 2017-08-27 MED ORDER — RISPERIDONE 0.5 MG PO TABS
0.5000 mg | ORAL_TABLET | Freq: Two times a day (BID) | ORAL | Status: DC
  Administered 2017-08-27 – 2017-09-07 (×22): 0.5 mg via ORAL
  Filled 2017-08-27 (×23): qty 1

## 2017-08-27 NOTE — Progress Notes (Signed)
[place] PHYSICAL MEDICINE & REHABILITATION     PROGRESS NOTE    Subjective/Complaints: Patient finished session with physical therapy, no complaints ROS: Limited due to cognitive/behavioral   Objective: Vital Signs: Blood pressure (!) 155/81, pulse (!) 50, temperature 98 F (36.7 C), temperature source Oral, resp. rate 18, height 5' 9.5" (1.765 m), weight 72.7 kg (160 lb 4.4 oz), SpO2 100 %. No results found. Recent Labs    08/26/17 0907  WBC 7.4  HGB 10.6*  HCT 33.4*  PLT 369   Recent Labs    08/26/17 0907  NA 130*  K 3.5  CL 97*  GLUCOSE 137*  BUN 8  CREATININE 0.67  CALCIUM 8.4*   CBG (last 3)  No results for input(s): GLUCAP in the last 72 hours.  Wt Readings from Last 3 Encounters:  08/18/17 72.7 kg (160 lb 4.4 oz)    Physical Exam:  Head: Normocephalic and atraumatic.  Eyes: Right eye exhibits no discharge. Left eye exhibits no discharge. No scleral icterus.  Neck: Normal range of motion. Neck supple. No thyromegaly present.  Oral: edentulous Cardiovascular: brady Respiratory:CT-scan of the abdomen i  GI: Soft. Bowel sounds are normal. He exhibits no distension.  Musculoskeletal: He exhibits no edema or tenderness.  Neurological: He is alert.  Patient provides his name and age.  Remains. Severely dysarthric A&Ox3 Right lean Motor: B/l UE 0/5 proximal to distal--except trace triceps B/l LE: HF 2 to 2+/5, KE 2+/5, distally 1-2/5. Trace resting tone. ?HAD Skin: Skin is hot, a few chronic changes Uro: foreskin now reduced  Psychiatric: a little anxious    Assessment/Plan: 1. 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 Physiatrist is providing close team supervision and 24 hour management of active medical problems listed below. Physiatrist and rehab team continue to assess barriers to discharge/monitor patient  progress toward functional and medical goals.  Function:  Bathing Bathing position   Position: Bed  Bathing parts   Body parts bathed by helper: Right arm, Left arm, Chest, Abdomen, Front perineal area, Buttocks, Right upper leg, Left upper leg, Right lower leg, Left lower leg, Back  Bathing assist Assist Level: 2 helpers      Upper Body Dressing/Undressing Upper body dressing   What is the patient wearing?: Pull over shirt/dress       Pull over shirt/dress - Perfomed by helper: Thread/unthread right sleeve, Thread/unthread left sleeve, Put head through opening, Pull shirt over trunk        Upper body assist Assist Level: (total assist)      Lower Body Dressing/Undressing Lower body dressing   What is the patient wearing?: Underwear, Pants, Non-skid slipper socks, Ted Hose   Underwear - Performed by helper: Thread/unthread left underwear leg, Thread/unthread right underwear leg, Pull underwear up/down   Pants- Performed by helper: Thread/unthread right pants leg, Thread/unthread left pants leg, Pull pants up/down   Non-skid slipper socks- Performed by helper: Don/doff right sock, Don/doff left sock               TED Hose - Performed by helper: Don/doff right TED hose, Don/doff left TED hose  Lower body assist Assist for lower body dressing: (total assist)      Toileting Toileting Toileting activity did not occur: No continent bowel/bladder event   Toileting steps completed by helper: Adjust clothing prior to toileting, Performs perineal hygiene, Adjust clothing after toileting  Toileting assist Assist level: Two helpers   Transfers Chair/bed transfer   Chair/bed transfer method: Squat pivot Chair/bed transfer assist level: Moderate assist (Pt 50 - 74%/lift or lower) Chair/bed transfer assistive device: Sliding board     Locomotion Ambulation Ambulation activity did not occur: Safety/medical concerns   Max distance: 7' Assist level: 2 helpers    Oceanographer activity did not occur: Safety/medical concerns Type: Manual(quadriparesis)      Cognition Comprehension Comprehension assist level: Understands basic 90% of the time/cues < 10% of the time  Expression Expression assist level: Expresses basic 75 - 89% of the time/requires cueing 10 - 24% of the time. Needs helper to occlude trach/needs to repeat words.  Social Interaction Social Interaction assist level: Interacts appropriately 90% of the time - Needs monitoring or encouragement for participation or interaction.  Problem Solving Problem solving assist level: Solves basic 25 - 49% of the time - needs direction more than half the time to initiate, plan or complete simple activities  Memory Memory assist level: Recognizes or recalls 50 - 74% of the time/requires cueing 25 - 49% of the time   Medical Problem List and Plan: 1.  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  DVT Prophylaxis/Anticoagulation: Subcutaneous heparin. Monitor for any bleeding episodes. Pending vascular study 3. Pain Management: Hydrocodone as needed, Zanaflex 4 mg every 6 hours as needed   -Continue low-dose baclofen to help with extensor and adductor tone 4. Mood/schizophrenia: Xanax 0.25 mg twice daily as needed, no evidence of agitation 5. Neuropsych: This patient is capable of making decisions on his own behalf. 6. Skin/Wound Care: Routine skin checks 7. Fluids/Electrolytes/Nutrition: encourage PO    -sodium 130 on 08/26/2017 fluid intake approximately 1000 mL's on 08/26/2017 8. Hypotension with bradycardia with history of HTN. Bradycardia felt to be chronic. Follow-up cardiology services  no further planned workup. Echocardiogram unremarkable. Troponin negative. Monitor with increased mobility 9. Hyponatremia. See above 10. Dysphagia. Dysphagia #2 thin liquids.advance per speech therapy  11. UTI/neurogenic bladder: urine suspicious. Also complains of foley irritation    -. UCX + for proteus and enterococcus--augmentin per pharm recs   -I/O caths to keep volumes 300-500 cc, volume still too high    -phimosis reduced by urology. Need to keep foreskin retracted 12.  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

## 2017-08-27 NOTE — Plan of Care (Signed)
  Progressing RH SKIN INTEGRITY RH STG MAINTAIN SKIN INTEGRITY WITH ASSISTANCE Description STG Maintain Skin Integrity With Assistance.total   08/27/2017 0108 - Progressing by Edd Arbour, RN RH SAFETY RH STG ADHERE TO SAFETY PRECAUTIONS W/ASSISTANCE/DEVICE Description STG Adhere to Safety Precautions With Assistance/Device. total  08/27/2017 0108 - Progressing by Edd Arbour, RN

## 2017-08-27 NOTE — Progress Notes (Addendum)
Occupational Therapy Session Note  Patient Details  Name: Dean Oliver MRN: [id number] Date of Birth: 09/07/1946  Today's Date: 08/27/2017 OT Individual Time: 0800-0900 OT Individual Time Calculation (min): 60 min    Short Term Goals: Week 1:  OT Short Term Goal 1 (Week 1): Pt will transfer to toilet/ BSC with mod A +1 with LRAD OT Short Term Goal 1 - Progress (Week 1): Progressing toward goal OT Short Term Goal 2 (Week 1): Pt will sitting EOB with supervision in prep for ADL task  OT Short Term Goal 2 - Progress (Week 1): Met OT Short Term Goal 3 (Week 1): Pt will perform one grooming task with max A with HOH A OT Short Term Goal 3 - Progress (Week 1): Progressing toward goal OT Short Term Goal 4 (Week 1): Pt will perform rolling right / left in bed with mod A for LB dressing  OT Short Term Goal 4 - Progress (Week 1): Progressing toward goal OT Short Term Goal 5 (Week 1): Pt will perform sit to stand in prep for ADL task with mod A +2 OT Short Term Goal 5 - Progress (Week 1): Progressing toward goal  Skilled Therapeutic Interventions/Progress Updates:    1:1. OT provides total A to don teds, socks, and pants with pt able to lift knees to A with rolling B to advance pants past hips. Pt reports BM, and OT assists with hygiene/clothing management while cueing pt for sequencing of rolling. Pt supine>sitting EOB with TOTAL A but able to maintain sitting balance with S as OT doffs/dons clean shirt and Ccollar. Pt squat pivot transfer EOB<>w/c with MAX A with manual facilitation of anterior weight shift and VC for power up through feet. Pt completes 1x15 arm skate repetitions of forward/back, circle, horizontal and elbow flex/ext with total A overall however trace activation for elbow extension/shoulder extension. Pt using shoulder elevation as compensatory movement to maximize range. Exited session with pt seated in bed with exit alarm on, call light in reach and preferred music playing in back  ground.  Therapy Documentation Precautions:  Precautions Precautions: Fall, Cervical Required Braces or Orthoses: Cervical Brace Cervical Brace: Hard collar, Other (comment)(okay to don in sitting) Restrictions Weight Bearing Restrictions: No General:    See Function Navigator for Current Functional Status.   Therapy/Group: Individual Therapy   M  08/27/2017, 12:23 PM 

## 2017-08-27 NOTE — Progress Notes (Signed)
Physical Therapy Session Note  Patient Details  Name: Dean Oliver MRN: [id number] Date of Birth: 1946-05-23  Today's Date: 08/27/2017 PT Individual Time: 1300-1300 PT Individual Time Calculation (min): 0 min   Short Term Goals: Week 1:  PT Short Term Goal 1 (Week 1): Pt will perform rolling R/L with maxA +1 PT Short Term Goal 1 - Progress (Week 1): Progressing toward goal PT Short Term Goal 2 (Week 1): Pt will perform supine>sit maxA +1 PT Short Term Goal 2 - Progress (Week 1): Progressing toward goal PT Short Term Goal 3 (Week 1): Pt will perform lateral scoot transfer with maxA +1 PT Short Term Goal 3 - Progress (Week 1): Progressing toward goal PT Short Term Goal 4 (Week 1): Pt will demonstrate OOB tolerance 4 hours/day outside of therapy sessions PT Short Term Goal 4 - Progress (Week 1): Progressing toward goal  Skilled Therapeutic Interventions/Progress Updates:  Attempted to see pt in the pm with family and nurse at bedside. Pt state he feels like his eyes are going to roll into the back of his head. Vitals taken as below. Pt unwilling to participate with therapy stating he just doesn't feel right. Pt worked with PT this am. Pt's nurse to call MD to discuss further. Pt has a hx of seizures. Treatment ended. Pt left sitting up in bed with family at bedside.   Therapy Documentation Precautions:  Precautions Precautions: Fall, Cervical Required Braces or Orthoses: Cervical Brace Cervical Brace: Hard collar, Other (comment)(okay to don in sitting) Restrictions Weight Bearing Restrictions: No General: PT Amount of Missed Time (min): 30 Minutes PT Missed Treatment Reason: Patient fatigue;Other (Comment)(pt not feeling well) Vital Signs: Therapy Vitals Temp: 97.9 F (36.6 C) Temp Source: Oral Pulse Rate: (!) 50 Resp: 20 BP: (!) 156/87 Oxygen Therapy SpO2: 100 % O2 Device: Not Delivered Pain:   See Function Navigator for Current Functional Status.   Therapy/Group:  Individual Therapy  Lawrnce, Reyez 08/27/2017, 1:21 PM

## 2017-08-27 NOTE — Progress Notes (Signed)
Physical Therapy Session Note  Patient Details  Name: Dean Oliver MRN: [id number] Date of Birth: 06/11/1946  Today's Date: 08/27/2017 PT Individual Time: 1000-1100 PT Individual Time Calculation (min): 60 min   Short Term Goals:  Week 2:  PT Short Term Goal 1 (Week 2): Pt will perform rolling L/R with maxA +1 consistently PT Short Term Goal 2 (Week 2): Pt will perform supine>sit maxA + 1 consistently PT Short Term Goal 3 (Week 2): Pt will perform lateral scoot transfer with maxA +1 consistently PT Short Term Goal 4 (Week 2): Pt will demonstrate OOB tolerance 4 hours/day outside of therapy sessions   Skilled Therapeutic Interventions/Progress Updates:   Pt received supine in bed and agreeable to PT. Supine>sit transfer with max assist. Reciprocal scooting to EOB with mod assist from PT  Squat pivot transfer with max assist to WC with moderate cues for anterior weight shift and activation of BLE  Sitting balance with supervision assist EOM x 5 minutes.   Sit<>stand with eva walker x 5 with mod-max assist. orthostasic  BP assessed sitting:   Gait training with Eva walker x 10ft with max + 2 assist. Max cues for gait pattern, step length, and posture. Assist from PT to prevent posterior LOB, and stabilize BLE  Pt returned to room and performed squat pivot transfer to bed with max assst. Sit>supine completed with max assist and left supine in bed with call bell in reach and all needs met.       Therapy Documentation Precautions:  Precautions Precautions: Fall, Cervical Required Braces or Orthoses: Cervical Brace Cervical Brace: Hard collar, Other (comment)(okay to don in sitting) Restrictions Weight Bearing Restrictions: No General: PT Amount of Missed Time (min): 30 Minutes PT Missed Treatment Reason: Patient fatigue;Other (Comment)(pt not feeling well) Vital Signs: Therapy Vitals Temp: 97.9 F (36.6 C) Temp Source: Axillary Pulse Rate: (!) 53 Resp: 16 BP:  125/69 Oxygen Therapy SpO2: 100 % O2 Device: Not Delivered Pain:   0/10   See Function Navigator for Current Functional Status.   Therapy/Group: Individual Therapy   E  08/27/2017, 3:41 PM  

## 2017-08-27 NOTE — Progress Notes (Signed)
Reported he feels like he is going to have a seizure. Reported having hearing vioces and feels like his eyes are going to to roll back in his head. VSS. Asking about shot for schizophrenia or medication for allucinations. [name]

## 2017-08-28 ENCOUNTER — Inpatient Hospital Stay (HOSPITAL_COMMUNITY): Payer: Medicare Other

## 2017-08-28 ENCOUNTER — Inpatient Hospital Stay (HOSPITAL_COMMUNITY): Payer: Medicare Other | Admitting: *Deleted

## 2017-08-28 NOTE — Progress Notes (Signed)
Occupational Therapy Note  Patient Details  Name: Dean Oliver MRN: [id number] Date of Birth: November 19, 1945  Today's Date: 08/28/2017 OT Individual Time: 1400-1430 OT Individual Time Calculation (min): 30 min   Pt c/o ongoing BLE and B shoulder "soreness"; repositioned and soft tissue mobs Individual Therapy  Pt asleep in bed upon arrival and easily aroused.  Pt stated he didn't want to get OOB but agreeable to BLE/BUE PROM, AROM, and AAROM.  Pt continues to exhibit increased tone in BLE and BUE.  1:1 NMES RUE wrist/finger extensors to increase functional use of RUE in self care tasks Ratio 1:3 Rate 35 pps Waveform- Asymmetric Ramp 1.0 Pulse 300 Intensity- 25 Duration -   10 mins  No adverse reactions after treatment and is skin intact.     [name] [facility] 08/28/2017, 2:27 PM

## 2017-08-28 NOTE — Progress Notes (Signed)
Physical Therapy Session Note  Patient Details  Name: Dean Oliver MRN: [id number] Date of Birth: 05/30/1946  Today's Date: 08/28/2017 PT Individual Time: 1545-1615 PT Individual Time Calculation (min): 30 min   Short Term Goals: Week 2:  PT Short Term Goal 1 (Week 2): Pt will perform rolling L/R with maxA +1 consistently PT Short Term Goal 2 (Week 2): Pt will perform supine>sit maxA + 1 consistently PT Short Term Goal 3 (Week 2): Pt will perform lateral scoot transfer with maxA +1 consistently PT Short Term Goal 4 (Week 2): Pt will demonstrate OOB tolerance 4 hours/day outside of therapy sessions  Skilled Therapeutic Interventions/Progress Updates:    Pt supine in bed upon PT arrival, stating that he "just got back to bed," agreeable to bed level exercises. Pt reports that he is :just sore all over" but does not rate pain. Therapist performed bilateral hamstring and calf stretches 3 x 30 sec each. Pt performed supine therex with bilateral LEs: 2 x 10 heel slides, 2 x 10 SLR's active assisted, 2 x 10 SAQ, 2 x 10 hip abduction and 1 x 10 partial bridges. Pt left supine in bed at end of session.   Therapy Documentation Precautions:  Precautions Precautions: Fall, Cervical Required Braces or Orthoses: Cervical Brace Cervical Brace: Hard collar, Other (comment)(okay to don in sitting) Restrictions Weight Bearing Restrictions: No   See Function Navigator for Current Functional Status.   Therapy/Group: Individual Therapy  Netta Corrigan , PT, DPT 08/28/2017, 4:00 PM

## 2017-08-28 NOTE — Plan of Care (Signed)
  Progressing RH SKIN INTEGRITY RH STG SKIN FREE OF INFECTION/BREAKDOWN Description Skin free of infection/breakdown  With total assist   08/28/2017 0228 - Progressing by Edd Arbour, RN RH SAFETY RH STG ADHERE TO SAFETY PRECAUTIONS W/ASSISTANCE/DEVICE Description STG Adhere to Safety Precautions With Assistance/Device. total  08/28/2017 0228 - Progressing by Edd Arbour, RN RH STG DECREASED RISK OF FALL WITH ASSISTANCE Description STG Decreased Risk of Fall With Assistance.total  08/28/2017 0228 - Progressing by Edd Arbour, RN

## 2017-08-28 NOTE — Progress Notes (Signed)
Occupational Therapy Session Note  Patient Details  Name: AKI ABALOS MRN: [id number] Date of Birth: 1946/08/11  Today's Date: 08/28/2017 OT Individual Time: 1000-1030 OT Individual Time Calculation (min): 30 min    Short Term Goals: Week 2:  OT Short Term Goal 1 (Week 2): Pt will transfer to toilet/ BSC with mod A +1 with LRAD OT Short Term Goal 2 (Week 2): Pt will perform one grooming task with max A with HOH A OT Short Term Goal 3 (Week 2): Pt will perform rolling right / left in bed with mod A for LB dressing  OT Short Term Goal 4 (Week 2): Pt will perform sit to stand in prep for ADL task with mod A +2  Skilled Therapeutic Interventions/Progress Updates:    1;1. Pt seated in TIS ready to go. Pt with tone in BUE in slight flexor synergy. OT provided PROM to break up tone and decrease risk of contracture to mod ranges tolerable by pt. Pt participates in AAROM of elbow flex/ext, shoulder flex/ext and ab/adduction 1x15 with total A and intermittent trace activation at shoulder. Pt requests to return to bed. Pt completes squat pivot transfer MAX A to EOB with knee blocking/manual facilitation of anterior weight shift and total A sit>supine. Exited session with pt semi reclined in bed with call light in reach.  Therapy Documentation Precautions:  Precautions Precautions: Fall, Cervical Required Braces or Orthoses: Cervical Brace Cervical Brace: Hard collar, Other (comment)(okay to don in sitting) Restrictions Weight Bearing Restrictions: No  See Function Navigator for Current Functional Status.   Therapy/Group: Individual Therapy  Tonny Branch 08/28/2017, 12:13 PM

## 2017-08-28 NOTE — Progress Notes (Signed)
Occupational Therapy Session Note  Patient Details  Name: Dean Oliver MRN: [id number] Date of Birth: 06-11-1946  Today's Date: 08/28/2017 OT Individual Time: 0800-0900 OT Individual Time Calculation (min): 60 min    Short Term Goals: Week 2:  OT Short Term Goal 1 (Week 2): Pt will transfer to toilet/ BSC with mod A +1 with LRAD OT Short Term Goal 2 (Week 2): Pt will perform one grooming task with max A with HOH A OT Short Term Goal 3 (Week 2): Pt will perform rolling right / left in bed with mod A for LB dressing  OT Short Term Goal 4 (Week 2): Pt will perform sit to stand in prep for ADL task with mod A +2  Skilled Therapeutic Interventions/Progress Updates:    OT intervention with focus on bed mobility, sitting balance, sit<>stand, functional transfers, and directing care.  Pt requires max A for rolling in bed to facilitate pulling pants over hips prior to sitting EOB.  Pt maintained sitting balance EOB at supervision level.  Pt performed sit<>stand from EOB X 5 with min A and max verbal cues for sequencing.  Pt performed squat pivot transfer to w/c with mod A and max verbal cues.  Pt directed care for feeding during breakfast.  Pt with no c/o of lightheadness this morning.  Ted hose, Ace wraps, and abdominal binder donned prior to sit<>stand and transfers.  Therapy Documentation Precautions:  Precautions Precautions: Fall, Cervical Required Braces or Orthoses: Cervical Brace Cervical Brace: Hard collar, Other (comment)(okay to don in sitting) Restrictions Weight Bearing Restrictions: No  Pain:  Pt stated he "feels good" ADL: ADL ADL Comments: see functional navigator  See Function Navigator for Current Functional Status.   Therapy/Group: Individual Therapy  [name] 08/28/2017, 10:00 AM

## 2017-08-28 NOTE — Progress Notes (Signed)
[place] PHYSICAL MEDICINE & REHABILITATION     PROGRESS NOTE    Subjective/Complaints: Asking for Ben gay for neck Discussed auditory hallucinations with RN and Pt , prior hx of schizophrenia, treated in past with a monthly shot (?prolixin) as well as risperdal. No further hallucinations since restarting low dose risperdal, denies SI ROS: Limited due to cognitive/behavioral   Objective: Vital Signs: Blood pressure (!) 168/80, pulse (!) 50, temperature 99 F (37.2 C), temperature source Oral, resp. rate 16, height 5' 9.5" (1.765 m), weight 72.7 kg (160 lb 4.4 oz), SpO2 100 %. No results found. Recent Labs    08/26/17 0907  WBC 7.4  HGB 10.6*  HCT 33.4*  PLT 369   Recent Labs    08/26/17 0907  NA 130*  K 3.5  CL 97*  GLUCOSE 137*  BUN 8  CREATININE 0.67  CALCIUM 8.4*   CBG (last 3)  No results for input(s): GLUCAP in the last 72 hours.  Wt Readings from Last 3 Encounters:  08/18/17 72.7 kg (160 lb 4.4 oz)    Physical Exam:  Head: Normocephalic and atraumatic.  Eyes: Right eye exhibits no discharge. Left eye exhibits no discharge. No scleral icterus.  Neck: Normal range of motion. Neck supple. No thyromegaly present.  Oral: edentulous Cardiovascular: brady Respiratory:CT-scan of the abdomen i  GI: Soft. Bowel sounds are normal. He exhibits no distension.  Musculoskeletal: He exhibits no edema or tenderness.  Neurological: He is alert.  Patient provides his name and age.  Remains. Severely dysarthric A&Ox3 Right lean Motor: B/l UE 0/5 proximal to distal--except trace triceps B/l LE: HF 2 to 2+/5, KE 2+/5, distally 1-2/5. Trace resting tone. ?HAD Skin: Skin is hot, a few chronic changes Uro: foreskin now reduced  Psychiatric: a little anxious    Assessment/Plan: 1. Functional and mobility deficits secondary to cervical central cord syndrome which require 3+ hours per day of interdisciplinary therapy in a comprehensive inpatient rehab  setting. Physiatrist is providing close team supervision and 24 hour management of active medical problems listed below. Physiatrist and rehab team continue to assess barriers to discharge/monitor patient progress toward functional and medical goals.  Function:  Bathing Bathing position   Position: Bed  Bathing parts   Body parts bathed by helper: Right arm, Left arm, Chest, Abdomen, Front perineal area, Buttocks, Right upper leg, Left upper leg, Right lower leg, Left lower leg, Back  Bathing assist Assist Level: 2 helpers      Upper Body Dressing/Undressing Upper body dressing   What is the patient wearing?: Pull over shirt/dress       Pull over shirt/dress - Perfomed by helper: Thread/unthread right sleeve, Thread/unthread left sleeve, Put head through opening, Pull shirt over trunk        Upper body assist Assist Level: (total assist)      Lower Body Dressing/Undressing Lower body dressing   What is the patient wearing?: Underwear, Pants, Non-skid slipper socks, Ted Hose   Underwear - Performed by helper: Thread/unthread left underwear leg, Thread/unthread right underwear leg, Pull underwear up/down   Pants- Performed by helper: Thread/unthread right pants leg, Thread/unthread left pants leg, Pull pants up/down   Non-skid slipper socks- Performed by helper: Don/doff right sock, Don/doff left sock               TED Hose - Performed by helper: Don/doff right TED hose, Don/doff left TED hose  Lower body assist Assist for lower body dressing: (total assist)  Toileting Toileting Toileting activity did not occur: No continent bowel/bladder event   Toileting steps completed by helper: Adjust clothing prior to toileting, Performs perineal hygiene, Adjust clothing after toileting    Toileting assist Assist level: Two helpers   Transfers Chair/bed transfer   Chair/bed transfer method: Squat pivot Chair/bed transfer assist level: Maximal assist (Pt 25 - 49%/lift  and lower) Chair/bed transfer assistive device: Sliding board     Locomotion Ambulation Ambulation activity did not occur: Safety/medical concerns   Max distance: 60ft  Assist level: 2 helpers   Oceanographer activity did not occur: Safety/medical concerns Type: Manual(quadriparesis)      Cognition Comprehension Comprehension assist level: Follows complex conversation/direction with no assist  Expression Expression assist level: Expresses complex ideas: With no assist  Social Interaction Social Interaction assist level: Interacts appropriately 90% of the time - Needs monitoring or encouragement for participation or interaction.  Problem Solving Problem solving assist level: Solves basic 25 - 49% of the time - needs direction more than half the time to initiate, plan or complete simple activities  Memory Memory assist level: Recognizes or recalls 50 - 74% of the time/requires cueing 25 - 49% of the time   Medical Problem List and Plan: 1.  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  DVT Prophylaxis/Anticoagulation: Subcutaneous heparin. Monitor for any bleeding episodes. Pending vascular study 3. Pain Management: Hydrocodone as needed, Zanaflex 4 mg every 6 hours as needed   -Continue low-dose baclofen to help with extensor and adductor tone 4. Mood/schizophrenia: Xanax 0.25 mg twice daily as needed, no evidence of agitation, cont risperdal .5mg  BID 5. Neuropsych: This patient is capable of making decisions on his own behalf. 6. Skin/Wound Care: Routine skin checks 7. Fluids/Electrolytes/Nutrition: encourage PO    -sodium 130 on 08/26/2017 fluid intake approximately 1150  mL's on 08/27/2017, O 3530ml No diuretics will check BMET in am 8. Hypotension with bradycardia with history of HTN. Bradycardia felt to be chronic. Follow-up cardiology services no further planned workup. Echocardiogram unremarkable. Troponin negative. Monitor with increased mobility 9. Hyponatremia. See above 10. Dysphagia. Dysphagia #2 thin liquids.advance per speech therapy  11. UTI/neurogenic bladder: urine suspicious. Also complains of foley irritation    -. UCX + for proteus and enterococcus--augmentin per pharm recs   -I/O caths to keep volumes 300-500 cc, volume still too high    -phimosis reduced by urology. Need to keep foreskin retracted 12.  Neurogenic bowel: Working on bowel program with daily suppository--incontinent bowel movement 08/27/2017     LOS (Days) 11 A FACE TO FACE EVALUATION WAS PERFORMED  Charlett Blake, MD 08/28/2017 10:13 AM

## 2017-08-29 ENCOUNTER — Inpatient Hospital Stay (HOSPITAL_COMMUNITY): Payer: Medicare Other | Admitting: Physical Therapy

## 2017-08-29 ENCOUNTER — Inpatient Hospital Stay (HOSPITAL_COMMUNITY): Payer: Medicare Other

## 2017-08-29 LAB — BASIC METABOLIC PANEL
Anion gap: 9 (ref 5–15)
BUN: 11 mg/dL (ref 6–20)
CO2: 26 mmol/L (ref 22–32)
CREATININE: 0.68 mg/dL (ref 0.61–1.24)
Calcium: 8.6 mg/dL — ABNORMAL LOW (ref 8.9–10.3)
Chloride: 97 mmol/L — ABNORMAL LOW (ref 101–111)
GFR calc non Af Amer: >60 mL/min (ref >60)
Glucose, Bld: 98 mg/dL (ref 65–99)
Potassium: 4.1 mmol/L (ref 3.5–5.1)
Sodium: 132 mmol/L — ABNORMAL LOW (ref 135–145)

## 2017-08-29 MED ORDER — FLEET ENEMA 7-19 GM/118ML RE ENEM
1.0000 | ENEMA | Freq: Every day | RECTAL | Status: DC
  Administered 2017-08-31 – 2017-09-05 (×5): 1 via RECTAL
  Filled 2017-08-29 (×8): qty 1

## 2017-08-29 NOTE — Progress Notes (Signed)
Physical Therapy Session Note  Patient Details  Name: Dean Oliver MRN: [id number] Date of Birth: 05-18-1946  Today's Date: 08/29/2017 PT Individual Time: 9983-3825 PT Individual Time Calculation (min): 43 min   Short Term Goals: Week 2:  PT Short Term Goal 1 (Week 2): Pt will perform rolling L/R with maxA +1 consistently PT Short Term Goal 2 (Week 2): Pt will perform supine>sit maxA + 1 consistently PT Short Term Goal 3 (Week 2): Pt will perform lateral scoot transfer with maxA +1 consistently PT Short Term Goal 4 (Week 2): Pt will demonstrate OOB tolerance 4 hours/day outside of therapy sessions  Skilled Therapeutic Interventions/Progress Updates:    Pt in bed working with OT upon PT arrival, agreeable to therapy tx and no report of pain. This PT assisted in rolling pt in order to help clean pt and don clean brief, pt able to bring knees into flexion to facilitate rolling but requiring max assist to control upper body. Pt transferred from supine>sidelying>sitting EOB with max assist. Pt seated EOB worked on sitting balance while second person set up w/c for transfer. Pt transferred from bed>TIS w/c with max assist +2 using slideboard, verbal and tactile cues for weightbearing/pushing through LEs. Pt transported to gym in w/c. Worked on LE neuro re-ed: pt performed LE modified PNF while reclined in w/c, contract-relax D2 extension with manual resistance and D2 flexion AROM 2 x 10 each LE. Pt used kinetron x 4 minutes with emphasis on reciprocal LE pattern, 80 cm/sec. Pt transported back to room and left seated in w/c with call bell.   Therapy Documentation Precautions:  Precautions Precautions: Fall, Cervical Required Braces or Orthoses: Cervical Brace Cervical Brace: Hard collar, Other (comment)(okay to don in sitting) Restrictions Weight Bearing Restrictions: No   See Function Navigator for Current Functional Status.   Therapy/Group: Individual Therapy  Netta Corrigan, PT,  DPT  08/29/2017, 12:41 PM

## 2017-08-29 NOTE — Progress Notes (Signed)
Occupational Therapy Note  Patient Details  Name: JACORI MULROONEY MRN: [id number] Date of Birth: 09/05/1946  Today's Date: 08/29/2017 OT Individual Time: 1330-1400 OT Individual Time Calculation (min): 30 min   No c/o pain. Pt received in bed and agreeable to bed level exercise. Performed gentle PROM to BUE. Pt has severely limited shoulder ROM and with mild stretches to his shoulders he has increased pain. he tolerates stretching from his elbows to fingers well. pt stated he felt his brief was soiled. Pt worked on rolling in bed with max A to cleanse and change brief.  Pt did try to actively use his legs.  At 1400, pt's PT arrived for his next session. Assisted her with +2 transfer on slide board from bed to w/c.   Bailyn Spackman 08/29/2017, 3:27 PM

## 2017-08-29 NOTE — Progress Notes (Signed)
[place] PHYSICAL MEDICINE & REHABILITATION     PROGRESS NOTE    Subjective/Complaints: A reasonable night.  No problems per nursing, slept generally through the night  ROS: Limited due to cognitive/behavioral   Objective: Vital Signs: Blood pressure (!) 147/68, pulse (!) 53, temperature 98.9 F (37.2 C), temperature source Oral, resp. rate 16, height 5' 9.5" (1.765 m), weight 72.7 kg (160 lb 4.4 oz), SpO2 100 %. No results found. No results for input(s): WBC, HGB, HCT, PLT in the last 72 hours. Recent Labs    08/29/17 0524  NA 132*  K 4.1  CL 97*  GLUCOSE 98  BUN 11  CREATININE 0.68  CALCIUM 8.6*   CBG (last 3)  No results for input(s): GLUCAP in the last 72 hours.  Wt Readings from Last 3 Encounters:  08/18/17 72.7 kg (160 lb 4.4 oz)    Physical Exam:  Head: Normocephalic and atraumatic.  Eyes: Right eye exhibits no discharge. Left eye exhibits no discharge. No scleral icterus.  Neck: Normal range of motion. Neck supple. No thyromegaly present.  Oral: edentulous Cardiovascular: bradycardic trace resting tone Respiratory:CTA Bilaterally without wheezes or rales. Normal effort  GI: Soft. Bowel sounds are normal. He exhibits no distension.  Musculoskeletal: He exhibits no edema or tenderness.  Neurological: He is alert.  Patient provides his name and age.  Remains. Severely dysarthric A&Ox3 Right lean Motor: B/l UE 0/5 proximal to distal--except trace triceps B/l LE: HF 2 to 2+/5, KE 2+/5, distally 1-2/5.  Skin: Skin is hot, a few chronic changes Uro: foreskin now reduced area clean slow to arouse Psychiatric: a little anxious    Assessment/Plan: 1. 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 Physiatrist is providing close team supervision and 24 hour management of active medical problems listed below. Physiatrist and  rehab team continue to assess barriers to discharge/monitor patient progress toward functional and medical goals.  Function:  Bathing Bathing position   Position: Bed  Bathing parts   Body parts bathed by helper: Right arm, Left arm, Chest, Abdomen, Front perineal area, Buttocks, Right upper leg, Left upper leg, Right lower leg, Left lower leg, Back  Bathing assist Assist Level: 2 helpers      Upper Body Dressing/Undressing Upper body dressing   What is the patient wearing?: Pull over shirt/dress       Pull over shirt/dress - Perfomed by helper: Thread/unthread right sleeve, Thread/unthread left sleeve, Put head through opening, Pull shirt over trunk        Upper body assist Assist Level: (total assist)      Lower Body Dressing/Undressing Lower body dressing   What is the patient wearing?: Underwear, Pants, Non-skid slipper socks, Ted Hose   Underwear - Performed by helper: Thread/unthread left underwear leg, Thread/unthread right underwear leg, Pull underwear up/down   Pants- Performed by helper: Thread/unthread right pants leg, Thread/unthread left pants leg, Pull pants up/down   Non-skid slipper socks- Performed by helper: Don/doff right sock, Don/doff left sock               TED Hose - Performed by helper: Don/doff right TED hose, Don/doff left TED hose  Lower body assist Assist for lower body dressing: (total assist)      Toileting Toileting Toileting activity did not occur: No continent bowel/bladder event   Toileting steps completed by helper: Adjust clothing prior to toileting, Performs perineal hygiene, Adjust  clothing after toileting    Toileting assist Assist level: Two helpers   Transfers Chair/bed transfer   Chair/bed transfer method: Squat pivot Chair/bed transfer assist level: Maximal assist (Pt 25 - 49%/lift and lower) Chair/bed transfer assistive device: Sliding board     Locomotion Ambulation Ambulation activity did not occur:  Safety/medical concerns   Max distance: 59ft  Assist level: 2 helpers   Oceanographer activity did not occur: Safety/medical concerns Type: Manual(quadriparesis)      Cognition Comprehension Comprehension assist level: Understands basic 90% of the time/cues < 10% of the time  Expression Expression assist level: Expresses basic 75 - 89% of the time/requires cueing 10 - 24% of the time. Needs helper to occlude trach/needs to repeat words.  Social Interaction Social Interaction assist level: Interacts appropriately 90% of the time - Needs monitoring or encouragement for participation or interaction.  Problem Solving Problem solving assist level: Solves basic 25 - 49% of the time - needs direction more than half the time to initiate, plan or complete simple activities  Memory Memory assist level: Recognizes or recalls 50 - 74% of the time/requires cueing 25 - 49% of the time   Medical Problem List and Plan: 1.  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  DVT Prophylaxis/Anticoagulation: Subcutaneous heparin. Monitor for any bleeding episodes. Pending vascular study 3. Pain Management: Hydrocodone as needed, Zanaflex 4 mg every 6 hours as needed   -Continue low-dose baclofen to help with extensor and adductor tone 4. Mood/schizophrenia: Xanax 0.25 mg twice daily as needed, no evidence of agitation,   risperdal .5mg  BID with good results 5. Neuropsych: This patient is capable of making decisions on his own behalf. 6. Skin/Wound Care: Routine skin checks 7. Fluids/Electrolytes/Nutrition: encourage PO    -, Potassium normalSodium up to 132 today 8. Hypotension with bradycardia  with history of HTN. Bradycardia felt to be chronic. Follow-up cardiology services no further planned workup. Echocardiogram unremarkable. Troponin negative. Monitor with increased mobility 9. Hyponatremia. See above 10. Dysphagia. Dysphagia #2 thin liquids.advance per speech therapy  11. UTI/neurogenic bladder:      -. UCX + for proteus and enterococcus--augmentin per pharm recs   -Foley replaced    -phimosis reduced by urology. Need to keep foreskin retracted 12.  Neurogenic bowel: Working on bowel program with daily suppository-- we will begin dailyFleet enema to encourage bowel movement on schedule  LOS (Days) 12 A Oxford T, MD 08/29/2017 3:05 PM

## 2017-08-30 ENCOUNTER — Inpatient Hospital Stay (HOSPITAL_COMMUNITY): Payer: Medicare Other | Admitting: Physical Therapy

## 2017-08-30 ENCOUNTER — Inpatient Hospital Stay (HOSPITAL_COMMUNITY): Payer: Medicare Other

## 2017-08-30 NOTE — Progress Notes (Signed)
Occupational Therapy Session Note  Patient Details  Name: Dean Oliver MRN: [id number] Date of Birth: 10-28-1945  Today's Date: 08/30/2017 OT Individual Time: 0700-0800 OT Individual Time Calculation (min): 60 min    Short Term Goals: Week 2:  OT Short Term Goal 1 (Week 2): Pt will transfer to toilet/ BSC with mod A +1 with LRAD OT Short Term Goal 2 (Week 2): Pt will perform one grooming task with max A with HOH A OT Short Term Goal 3 (Week 2): Pt will perform rolling right / left in bed with mod A for LB dressing  OT Short Term Goal 4 (Week 2): Pt will perform sit to stand in prep for ADL task with mod A +2  Skilled Therapeutic Interventions/Progress Updates:    Pt resting in bed upon arrival and agreeable to participating in therapy.  Pt inquired about breakfast but agreeable to therapy before eating breakfast.  Focus on bed mobility to facilitate LB dressing tasks.  Pt follows one step commands to roll R/L with max A.  Pt able to raise knees on command and attempt bridge in bed to pull up pants.  Pt required max A for supine>sit EOB for UB dressing tasks.  Pt able to sit EOB at supervision level but requires tot A for donning pullover shirt.  Pt required max A for squat pivot transfer to w/c to engage in grooming tasks with tot A (HOH). Pt remained seated in w/c with all needs within reach.   Therapy Documentation Precautions:  Precautions Precautions: Fall, Cervical Required Braces or Orthoses: Cervical Brace Cervical Brace: Hard collar, Other (comment)(okay to don in sitting) Restrictions Weight Bearing Restrictions: No   Pain:  Pt c/o BLE soreness but "ok"  See Function Navigator for Current Functional Status.   Therapy/Group: Individual Therapy  [name] 08/30/2017, 8:01 AM

## 2017-08-30 NOTE — Patient Care Conference (Signed)
Inpatient RehabilitationTeam Conference and Plan of Care Update Date: 08/30/2017   Time: 2:05 PM    Patient Name: Dean Oliver      Medical Record Number: [id number]  Date of Birth: 10-04-1945 Sex: Male         Room/Bed: 4W08C/4W08C-01 Payor Info: Payor: MEDICARE / Plan: MEDICARE PART B / Product Type: *No Product type* /    Admitting Diagnosis: Hypotension  Admit Date/Time:  08/17/2017  4:17 PM Admission Comments: No comment available   Primary Diagnosis:  Central cord syndrome ([place]) Principal Problem: Central cord syndrome [facility])  Patient Active Problem List   Diagnosis Date Noted  . Neurogenic bladder 08/18/2017  . Neurogenic bowel 08/18/2017  . Bacterial UTI 08/18/2017  . Central cord syndrome ([place]) 08/17/2017  . Dysphagia   . Fall   . Hypotension   . Sinus bradycardia   . HNP (herniated nucleus pulposus) with myelopathy, cervical   . Benign essential HTN   . Hyponatremia   . Acute blood loss anemia   . Symptomatic bradycardia 08/12/2017  . Schizophrenia [facility])     Expected Discharge Date: Expected Discharge Date: (SNF)  Team Members Present: Physician leading conference: Dr. Alger Oliver Social Worker Present: Dean Pall, LCSW Nurse Present: Dean Creamer, RN PT Present: Dean Oliver, PT OT Present: Dean Oliver, [place], OT PPS Coordinator present : Dean Nakayama, RN, CRRN     Current Status/Progress Goal Weekly Team Focus  Medical   Minimal changes in neurological examination.  Foley replaced.  Still incontinent of bowel and working towards bowel program  Establish  See above   Bowel/Bladder   incont of bowel; foley catheter; lbm 08/29/2017  free from CAUTI  foley care and peri care; empty drainage bag   Swallow/Nutrition/ Hydration             ADL's   bed mobility-max A; sitting balance-min A/supervision; self care-tot A; functional transfers-mod/max A  mod A overall      Mobility   totalA bed mobility, mod/maxA squat pivot transfers,  maxA +2 gait non-functional distances   modA bed mobility, transfers, modA standing balance  activity tolerance, sitting/standing balance   Communication             Safety/Cognition/ Behavioral Observations  CVA; heparin; maximove  free from injury/fall  follow patient safety plan   Pain   no c/o pain  <2 out of 10  assess for pain q shift and prn   Skin   bacitracin to foreskin; foam to sacrum  skin free from infection and breakdown  assess skin q shift and prn    Rehab Goals Patient on target to meet rehab goals: Yes *See Care Plan and progress notes for long and short-term goals.     Barriers to Discharge  Current Status/Progress Possible Resolutions Date Resolved   Physician    Neurogenic Bowel & Bladder        Foley replaced.  Trying suppository/enema for a.m. program      Nursing                  PT                    OT                  SLP                SW  Discharge Planning/Teaching Needs:  Plan for pt to d/c to at SNF when medically cleared to do so.  NA   Team Discussion:  Medically ready for SNF per MD but will need to continue to address bowel.  Currently squat -pivot at mod assist.  Improved sitting balance.  SW has begun bed search.  Revisions to Treatment Plan:  None    Continued Need for Acute Rehabilitation Level of Care: The patient requires daily medical management by a physician with specialized training in physical medicine and rehabilitation for the following conditions: Daily direction of a multidisciplinary physical rehabilitation program to ensure safe treatment while eliciting the highest outcome that is of practical value to the patient.: Yes Daily medical management of patient stability for increased activity during participation in an intensive rehabilitation regime.: Yes Daily analysis of laboratory values and/or radiology reports with any subsequent need for medication adjustment of medical intervention for : Neurological  problems  Dean Oliver 08/30/2017, 3:06 PM

## 2017-08-30 NOTE — Progress Notes (Signed)
[place] PHYSICAL MEDICINE & REHABILITATION     PROGRESS NOTE    Subjective/Complaints: Sitting up in chair.  Nurse tech feeding him breakfast.  No complaints today   ROS: pt denies nausea, vomiting, diarrhea, cough, shortness of breath or chest pain    Objective: Vital Signs: Blood pressure (!) 148/64, pulse 63, temperature 98.3 F (36.8 C), temperature source Oral, resp. rate 17, height 5' 9.5" (1.765 m), weight 72.7 kg (160 lb 4.4 oz), SpO2 100 %. No results found. No results for input(s): WBC, HGB, HCT, PLT in the last 72 hours. Recent Labs    08/29/17 0524  NA 132*  K 4.1  CL 97*  GLUCOSE 98  BUN 11  CREATININE 0.68  CALCIUM 8.6*   CBG (last 3)  No results for input(s): GLUCAP in the last 72 hours.  Wt Readings from Last 3 Encounters:  08/18/17 72.7 kg (160 lb 4.4 oz)    Physical Exam:  Head: Normocephalic and atraumatic.  Eyes: Right eye exhibits no discharge. Left eye exhibits no discharge. No scleral icterus.  Neck: Normal range of motion. Neck supple. No thyromegaly present.  Oral: edentulous Cardiovascular: Bradycardic Respiratory:CTA Bilaterally without wheezes or rales. Normal effort  GI: Soft. Bowel sounds are normal. He exhibits no distension.  Musculoskeletal: He exhibits no edema or tenderness.  Neurological: He is alert.  Patient provides his name and age.  Remains. Severely dysarthric A&Ox3 Right lean Motor: B/l UE 0/5 proximal to distal--except trace triceps B/l LE: HF 2 to 2+/5, KE 2+/5, distally 1-2/5.  No change Skin:   Uro: foreskin now reduced area.  Foley in place Psychiatric: a little anxious    Assessment/Plan: 1. 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 Physiatrist is providing close team supervision and 24 hour management of active medical problems listed below. Physiatrist and rehab team  continue to assess barriers to discharge/monitor patient progress toward functional and medical goals.  Function:  Bathing Bathing position   Position: Bed  Bathing parts   Body parts bathed by helper: Right arm, Left arm, Chest, Abdomen, Front perineal area, Buttocks, Right upper leg, Left upper leg, Right lower leg, Left lower leg, Back  Bathing assist Assist Level: 2 helpers      Upper Body Dressing/Undressing Upper body dressing   What is the patient wearing?: Pull over shirt/dress       Pull over shirt/dress - Perfomed by helper: Thread/unthread right sleeve, Thread/unthread left sleeve, Put head through opening, Pull shirt over trunk        Upper body assist Assist Level: (total assist)      Lower Body Dressing/Undressing Lower body dressing   What is the patient wearing?: Underwear, Pants, Non-skid slipper socks, Ted Hose   Underwear - Performed by helper: Thread/unthread left underwear leg, Thread/unthread right underwear leg, Pull underwear up/down   Pants- Performed by helper: Thread/unthread right pants leg, Thread/unthread left pants leg, Pull pants up/down   Non-skid slipper socks- Performed by helper: Don/doff right sock, Don/doff left sock               TED Hose - Performed by helper: Don/doff right TED hose, Don/doff left TED hose  Lower body assist Assist for lower body dressing: (total assist)      Toileting Toileting Toileting activity did not occur: No continent bowel/bladder event   Toileting steps completed by helper: Adjust clothing prior to toileting,  Performs perineal hygiene, Adjust clothing after toileting    Toileting assist Assist level: Two helpers   Transfers Chair/bed transfer   Chair/bed transfer method: Lateral scoot Chair/bed transfer assist level: 2 helpers Chair/bed transfer assistive device: Sliding board     Locomotion Ambulation Ambulation activity did not occur: Safety/medical concerns   Max distance: 57ft  Assist  level: 2 helpers   Oceanographer activity did not occur: Safety/medical concerns Type: Manual(quadriparesis)      Cognition Comprehension Comprehension assist level: Understands basic 90% of the time/cues < 10% of the time  Expression Expression assist level: Expresses basic 75 - 89% of the time/requires cueing 10 - 24% of the time. Needs helper to occlude trach/needs to repeat words.  Social Interaction Social Interaction assist level: Interacts appropriately 90% of the time - Needs monitoring or encouragement for participation or interaction.  Problem Solving Problem solving assist level: Solves basic 25 - 49% of the time - needs direction more than half the time to initiate, plan or complete simple activities  Memory Memory assist level: Recognizes or recalls 50 - 74% of the time/requires cueing 25 - 49% of the time   Medical Problem List and Plan: 1.  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  DVT Prophylaxis/Anticoagulation: Subcutaneous heparin. Monitor for any bleeding episodes. Pending vascular study 3. Pain Management: Hydrocodone as needed, Zanaflex 4 mg every 6 hours as needed   -Continue low-dose baclofen to help with extensor and adductor tone 4. Mood/schizophrenia: Xanax 0.25 mg twice daily as needed, no evidence of agitation,   risperdal .5mg  BID with good results 5. Neuropsych: This patient is capable of making decisions on his own behalf. 6. Skin/Wound Care: Routine skin checks 7. Fluids/Electrolytes/Nutrition: encourage PO    -, Potassium normalSodium up to 132   8. Hypotension with bradycardia with history of HTN. Bradycardia felt to be  chronic. Follow-up cardiology services no further planned workup. Echocardiogram unremarkable. Troponin negative. Monitor with increased mobility 9. Hyponatremia. See above 10. Dysphagia. Dysphagia #2 thin liquids.advance per speech therapy  11. UTI/neurogenic bladder:      -. UCX + for proteus and enterococcus--augmentin per pharm recs   -Foley replaced 12.  Neurogenic bowel: Working on bowel program with daily suppository--     -Have initiated daily Fleet enema to encourage bowel movement on AM schedule  LOS (Days) 13 A [place] T, MD 08/30/2017 8:54 AM

## 2017-08-30 NOTE — Progress Notes (Signed)
Physical Therapy Session Note  Patient Details  Name: Dean Oliver MRN: [id number] Date of Birth: 1946-05-30  Today's Date: 08/30/2017 PT Individual Time: 1000-1025 PT Individual Time Calculation (min): 25 min  and Today's Date: 08/30/2017 PT Missed Time: 35 Minutes Missed Time Reason: Patient fatigue(pt reports feeling sick)  Short Term Goals: Week 2:  PT Short Term Goal 1 (Week 2): Pt will perform rolling L/R with maxA +1 consistently PT Short Term Goal 2 (Week 2): Pt will perform supine>sit maxA + 1 consistently PT Short Term Goal 3 (Week 2): Pt will perform lateral scoot transfer with maxA +1 consistently PT Short Term Goal 4 (Week 2): Pt will demonstrate OOB tolerance 4 hours/day outside of therapy sessions  Skilled Therapeutic Interventions/Progress Updates:    Pt seated in TIS w/c upon PT arrival. Pt states "I'm sick and I have a fever." Therapist clarified with RN that the pt's temperature is normal and he does not have a fever. Therapist providing max encouragement to participate in therapy this session however pt continued to repeat "I'm sick, put me back in bed." Pt transferred from w/c>bed squat pivot transfer with mod assist, verbal cues for pushing through LEs and manual facilitation to keep pt's trunk flexed. Pt demonstrated seated balance with CGA-min assist while therapist doffed cervical collar and moved w/c. Pt transferred from sitting>sidelying with max assist, verbal cues for pt to try to lift LEs. Pt transferred sidelying>supine with max assist. Pt left supine in bed with call button in reach. This pt missed 35 minutes of skilled therapy tx this session.   Therapy Documentation Precautions:  Precautions Precautions: Fall, Cervical Required Braces or Orthoses: Cervical Brace Cervical Brace: Hard collar, Other (comment)(okay to don in sitting) Restrictions Weight Bearing Restrictions: No   See Function Navigator for Current Functional Status.   Therapy/Group:  Individual Therapy  Netta Corrigan, PT, DPT 08/30/2017, 10:23 AM

## 2017-08-30 NOTE — Progress Notes (Signed)
Social Work Patient ID: Dean Oliver, male   DOB: 06-03-1946, 71 y.o.   MRN: [id number]     Dean Oliver  Social Worker  General Practice  Patient Care Conference  Signed  Date of Service:  08/30/2017  3:06 PM          Signed          [] Hide copied text  [] Hover for details   Inpatient RehabilitationTeam Conference and Plan of Care Update Date: 08/30/2017   Time: 2:05 PM      Patient Name: Dean Oliver      Medical Record Number: [id number]  Date of Birth: [birth year] Sex: Male         Room/Bed: 4W08C/4W08C-01 Payor Info: Payor: MEDICARE / Plan: MEDICARE PART B / Product Type: *No Product type* /     Admitting Diagnosis: Hypotension  Admit Date/Time:  08/17/2017  4:17 PM Admission Comments: No comment available    Primary Diagnosis:  Central cord syndrome ([place]) Principal Problem: Central cord syndrome [facility])       Patient Active Problem List    Diagnosis Date Noted  . Neurogenic bladder 08/18/2017  . Neurogenic bowel 08/18/2017  . Bacterial UTI 08/18/2017  . Central cord syndrome ([place]) 08/17/2017  . Dysphagia    . Fall    . Hypotension    . Sinus bradycardia    . HNP (herniated nucleus pulposus) with myelopathy, cervical    . Benign essential HTN    . Hyponatremia    . Acute blood loss anemia    . Symptomatic bradycardia 08/12/2017  . Schizophrenia [facility])        Expected Discharge Date: Expected Discharge Date: (SNF)   Team Members Present: Physician leading conference: Dr. Alger Oliver Social Worker Present: Dean Pall, Dean Oliver Nurse Present: Dean Creamer, RN PT Present: Dean Oliver, PT OT Present: Dean Oliver, [place], OT PPS Coordinator present : Dean Nakayama, RN, CRRN       Current Status/Progress Goal Weekly Team Focus  Medical     Minimal changes in neurological examination.  Foley replaced.  Still incontinent of bowel and working towards bowel program  Establish  See above   Bowel/Bladder     incont of bowel;  foley catheter; lbm 08/29/2017  free from CAUTI  foley care and peri care; empty drainage bag   Swallow/Nutrition/ Hydration               ADL's     bed mobility-max A; sitting balance-min A/supervision; self care-tot A; functional transfers-mod/max A  mod A overall      Mobility     totalA bed mobility, mod/maxA squat pivot transfers, maxA +2 gait non-functional distances   modA bed mobility, transfers, modA standing balance  activity tolerance, sitting/standing balance   Communication               Safety/Cognition/ Behavioral Observations   CVA; heparin; maximove  free from injury/fall  follow patient safety plan   Pain     no c/o pain  <2 out of 10  assess for pain q shift and prn   Skin     bacitracin to foreskin; foam to sacrum  skin free from infection and breakdown  assess skin q shift and prn     Rehab Goals Patient on target to meet rehab goals: Yes *See Care Plan and progress notes for long and short-term goals.      Barriers to Discharge   Current Status/Progress Possible Resolutions  Date Resolved   Physician     Neurogenic Bowel & Bladder        Foley replaced.  Trying suppository/enema for a.m. program      Nursing                 PT                    OT                 SLP            SW              Discharge Planning/Teaching Needs:  Plan for pt to d/c to at SNF when medically cleared to do so.  NA   Team Discussion:  Medically ready for SNF per MD but will need to continue to address bowel.  Currently squat -pivot at mod assist.  Improved sitting balance.  SW has begun bed search.  Revisions to Treatment Plan:  None    Continued Need for Acute Rehabilitation Level of Care: The patient requires daily medical management by a physician with specialized training in physical medicine and rehabilitation for the following conditions: Daily direction of a multidisciplinary physical rehabilitation program to ensure safe treatment while eliciting the highest  outcome that is of practical value to the patient.: Yes Daily medical management of patient stability for increased activity during participation in an intensive rehabilitation regime.: Yes Daily analysis of laboratory values and/or radiology reports with any subsequent need for medication adjustment of medical intervention for : Neurological problems   Dean Oliver 08/30/2017, 3:06 PM                  Dean Oliver, [place] Worker  General Practice  Patient Care Conference  Signed  Date of Service:  08/24/2017  3:54 PM          Signed          [] Hide copied text  [] Hover for details   Inpatient RehabilitationTeam Conference and Plan of Care Update Date: 08/23/2017   Time: 2:10 PM      Patient Name: Dean Oliver      Medical Record Number: [id number]  Date of Birth: 1945/10/07 Sex: Male         Room/Bed: 4W08C/4W08C-01 Payor Info: Payor: MEDICARE / Plan: MEDICARE PART B / Product Type: *No Product type* /     Admitting Diagnosis: Hypotension  Admit Date/Time:  08/17/2017  4:17 PM Admission Comments: No comment available    Primary Diagnosis:  Central cord syndrome ([place]) Principal Problem: Central cord syndrome [facility]       Patient Active Problem List    Diagnosis Date Noted  . Neurogenic bladder 08/18/2017  . Neurogenic bowel 08/18/2017  . Bacterial UTI 08/18/2017  . Central cord syndrome ([place]) 08/17/2017  . Dysphagia    . Fall    . Hypotension    . Sinus bradycardia    . HNP (herniated nucleus pulposus) with myelopathy, cervical    . Benign essential HTN    . Hyponatremia    . Acute blood loss anemia    . Symptomatic bradycardia 08/12/2017  . Schizophrenia [facility])        Expected Discharge Date: Expected Discharge Date: (SNF)   Team Members Present: Physician leading conference: Dr. Alger Oliver Social Worker Present: Dean Pall, Dean Oliver Nurse Present: Dean Pence, RN PT Present: Dean Oliver, PT OT Present: Dean Oliver, OT;Dean Oliver  Dean Oliver SLP Present: Dean Oliver, SLP PPS Coordinator present : Dean Oliver, PT       Current Status/Progress Goal Weekly Team Focus  Medical     Cervical central cord syndrome with tetraplegia, early spasticity.  Patient with neurogenic bowel and bladder as well.  Improve functional use of arms and legs.  Bladder management, treating urinary tract infection, heart rate and blood pressure control, pain management   Bowel/Bladder     requires I & O cath Q 6-8 hours for relief  maintain bladder free from infections      Swallow/Nutrition/ Hydration     dys 3 and thin liquids, Min- Supervision A pocketing and removing food  supervision  skilled observation during meal, carryover strategies for swallowing   ADL's     bed mobility-max A; sitting balance-max A/mod A; tot A for self care  mod A overall  activity tolerance, bed mobility, sitting balance, BADL retraining   Mobility     totalA bed mobility and transfers, +2 sit <>stand, min guard sitting balance  modA bed mobility, transfers, modA standing balance  activity tolerance, participation in therapy, bed mobility, transfers, sitting balance   Communication     clear verbal speech  maintain clear communication      Safety/Cognition/ Behavioral Observations   alert and oriented without noted behaviors  maintain cognition to meet needs      Pain     denies any pain   maintian adequate comfort      Skin     intact  maintain skin free from breakdowns        Rehab Goals Patient on target to meet rehab goals: Yes *See Care Plan and progress notes for long and short-term goals.      Barriers to Discharge   Current Status/Progress Possible Resolutions Date Resolved   Physician     Medical stability;Neurogenic Bowel & Bladder        Ongoing rehabilitation efforts.  Treating UTI.  May need to go home with a Foley catheter unless a caregiver is able to perform caths      Nursing                 PT                    OT                   SLP            SW              Discharge Planning/Teaching Needs:  Plan for pt to d/c to at SNF when medically cleared to do so.  NA   Team Discussion:  Pt with central cord injury and baseline cognitive deficits; difficult to understand.  Awaiting in put from urology.  Need to address bowel issues and establish a good b/b program.  Total assistance overall;  Max tone.  amb 7' with 2 max assist today.  Trial of tilt-in-space w/c.  Pt is pocketing food but overall with good intake.  Mod assist goals overall.  Revisions to Treatment Plan:       Continued Need for Acute Rehabilitation Level of Care: The patient requires daily medical management by a physician with specialized training in physical medicine and rehabilitation for the following conditions: Daily direction of a multidisciplinary physical rehabilitation program to ensure safe treatment while eliciting the highest outcome that is of practical value to the patient.: Yes  Daily medical management of patient stability for increased activity during participation in an intensive rehabilitation regime.: Yes Daily analysis of laboratory values and/or radiology reports with any subsequent need for medication adjustment of medical intervention for : Neurological problems;Urological problems   Shahana Capes 08/24/2017, 3:54 PM

## 2017-08-30 NOTE — Progress Notes (Signed)
Physical Therapy Session Note  Patient Details  Name: Dean Oliver MRN: [id number] Date of Birth: 02-28-1946  Today's Date: 08/30/2017 PT Individual Time: 1330-1400 PT Individual Time Calculation (min): 30 min   Short Term Goals: Week 2:  PT Short Term Goal 1 (Week 2): Pt will perform rolling L/R with maxA +1 consistently PT Short Term Goal 2 (Week 2): Pt will perform supine>sit maxA + 1 consistently PT Short Term Goal 3 (Week 2): Pt will perform lateral scoot transfer with maxA +1 consistently PT Short Term Goal 4 (Week 2): Pt will demonstrate OOB tolerance 4 hours/day outside of therapy sessions  Skilled Therapeutic Interventions/Progress Updates: Pt received seated in bed, denies pain and agreeable to treatment. Supine>sit with HOB elevated and maxA. Transfer bed>w/c and w/c <>mat table with squat pivot mod/maxA. Sitting balance R/L lateral leans to prop on elbow 3x30 sec each side for weight bearing through proximal UE, PROM to B trunk. Requires totalA to return to midline sitting after propped on elbow. Engaged pt in music therapy during session to improve participation and motivation. Remained seated in w/c at end of session semi-reclined, soft call bell in reach and pt demos ability to turn on alarm.       Therapy Documentation Precautions:  Precautions Precautions: Fall, Cervical Required Braces or Orthoses: Cervical Brace Cervical Brace: Hard collar, Other (comment)(okay to don in sitting) Restrictions Weight Bearing Restrictions: No   See Function Navigator for Current Functional Status.   Therapy/Group: Individual Therapy  Luberta Mutter 08/30/2017, 2:20 PM

## 2017-08-31 ENCOUNTER — Inpatient Hospital Stay (HOSPITAL_COMMUNITY): Payer: Medicare Other | Admitting: Physical Therapy

## 2017-08-31 ENCOUNTER — Inpatient Hospital Stay (HOSPITAL_COMMUNITY): Payer: Medicare Other | Admitting: Occupational Therapy

## 2017-08-31 ENCOUNTER — Inpatient Hospital Stay (HOSPITAL_COMMUNITY): Payer: Medicare Other

## 2017-08-31 NOTE — Progress Notes (Signed)
Occupational Therapy Session Note  Patient Details  Name: Dean Oliver MRN: [id number] Date of Birth: 1946-04-09  Today's Date: 08/31/2017 OT Individual Time: 0800-0900 OT Individual Time Calculation (min): 60 min    Short Term Goals: Week 2:  OT Short Term Goal 1 (Week 2): Pt will transfer to toilet/ BSC with mod A +1 with LRAD OT Short Term Goal 2 (Week 2): Pt will perform one grooming task with max A with HOH A OT Short Term Goal 3 (Week 2): Pt will perform rolling right / left in bed with mod A for LB dressing  OT Short Term Goal 4 (Week 2): Pt will perform sit to stand in prep for ADL task with mod A +2  Skilled Therapeutic Interventions/Progress Updates:    Pt engaged in BADL retraining including bathing at shower level and dressing with sit<>stand Clarise Cruz lift) from w/c.  Pt transferred to rolling shower chair with max A for squat pivot.  Pt required tot A for bathing tasks with HOH tot A to wash upper legs.  Pt required tot A for dressing tasks while seated in shower chair.  Pt stood with Clarise Cruz lift to facilitate pulling pants over hips.  Focus on following one step commands and directing care.  Pt remained in w/c with all needs within reach.  Therapy Documentation Precautions:  Precautions Precautions: Fall, Cervical Required Braces or Orthoses: Cervical Brace Cervical Brace: Hard collar, Other (comment)(okay to don in sitting) Restrictions Weight Bearing Restrictions: No    Pain:  Pt stated he "felt ok."  See Function Navigator for Current Functional Status.   Therapy/Group: Individual Therapy  [name] 08/31/2017, 11:07 AM

## 2017-08-31 NOTE — Plan of Care (Signed)
  Progressing RH SAFETY RH STG ADHERE TO SAFETY PRECAUTIONS W/ASSISTANCE/DEVICE Description STG Adhere to Safety Precautions With Assistance/Device. total  08/31/2017 2211 - Progressing by Edd Arbour, RN

## 2017-08-31 NOTE — NC FL2 (Signed)
[place] LEVEL OF CARE SCREENING TOOL     IDENTIFICATION  Patient Name: Dean Oliver Birthdate: 10/23/1945 Sex: male Admission Date (Current Location): 08/17/2017  [place] and [place] Number:  [place] [id number] [place] and Address:  [facility] [place] [address] [place], [place] [address]      Provider Number: [id number]  Attending Physician Name and Address:  Meredith Staggers, MD  Relative Name and Phone Number:  [name] (legal guardian) [phone]    Current Level of Care: Other (Comment)(Acute Inpatient Rehab) Recommended Level of Care: [place] Prior Approval Number:    Date Approved/Denied:   PASRR Number:    Discharge Plan: SNF    Current Diagnoses: Patient Active Problem List   Diagnosis Date Noted  . Neurogenic bladder 08/18/2017  . Neurogenic bowel 08/18/2017  . Bacterial UTI 08/18/2017  . Central cord syndrome ([place]) 08/17/2017  . Dysphagia   . Fall   . Hypotension   . Sinus bradycardia   . HNP (herniated nucleus pulposus) with myelopathy, cervical   . Benign essential HTN   . Hyponatremia   . Acute blood loss anemia   . Symptomatic bradycardia 08/12/2017  . Schizophrenia ([place])     Orientation RESPIRATION BLADDER Height & Weight     Self, Place  Normal Indwelling catheter Weight: 72.7 kg (160 lb 4.4 oz) Height:  5' 9.5" (176.5 cm)(stated)  BEHAVIORAL SYMPTOMS/MOOD NEUROLOGICAL BOWEL NUTRITION STATUS      Incontinent Diet(Dys 3, thin liquids)  AMBULATORY STATUS COMMUNICATION OF NEEDS Skin   Extensive Assist Verbally Other (Comment)(bacitracin to foreskin; foam to sacrum )                       Personal Care Assistance Level of Assistance  Bathing, Feeding, Dressing, Total care Bathing Assistance: Maximum assistance Feeding assistance: Limited assistance Dressing Assistance: Maximum assistance Total Care Assistance: Maximum assistance   Functional Limitations Info              SPECIAL CARE FACTORS FREQUENCY  PT (By licensed PT), OT (By licensed OT)     PT Frequency: 5x/wk OT Frequency: 5x/wk            Contractures Contractures Info: Not present    Additional Factors Info  Psychotropic     Psychotropic Info: risperidal         Current Medications (08/31/2017):  This is the current hospital active medication list Current Facility-Administered Medications  Medication Dose Route Frequency Provider Last Rate Last Dose  . acetaminophen (TYLENOL) tablet 650 mg  650 mg Oral Q4H PRN Cathlyn Parsons, PA-C   650 mg at 08/30/17 0856  . ALPRAZolam Duanne Moron) tablet 0.25 mg  0.25 mg Oral BID PRN Cathlyn Parsons, PA-C   0.25 mg at 08/27/17 2112  . aspirin EC tablet 81 mg  81 mg Oral Daily Cathlyn Parsons, PA-C   81 mg at 08/31/17 5176  . bacitracin ointment   Topical BID Ardis Hughs, MD      . baclofen (LIORESAL) tablet 5 mg  5 mg Oral BID Meredith Staggers, MD   5 mg at 08/31/17 1607  . heparin injection 5,000 Units  5,000 Units Subcutaneous Q8H Cathlyn Parsons, PA-C   5,000 Units at 08/31/17 3710  . HYDROcodone-acetaminophen (NORCO/VICODIN) 5-325 MG per tablet 1-2 tablet  1-2 tablet Oral Q6H PRN Cathlyn Parsons, PA-C   1 tablet at 08/20/17 2036  . MUSCLE RUB  CREA   Topical PRN Angiulli, Lavon Paganini, PA-C      . nitroGLYCERIN (NITROSTAT) SL tablet 0.4 mg  0.4 mg Sublingual Q5 Min x 3 PRN Angiulli, Lavon Paganini, PA-C      . ondansetron [facility]) tablet 4 mg  4 mg Oral Q6H PRN Angiulli, Lavon Paganini, PA-C       Or  . ondansetron [facility]) injection 4 mg  4 mg Intravenous Q6H PRN Angiulli, Lavon Paganini, PA-C      . risperiDONE (RISPERDAL) tablet 0.5 mg  0.5 mg Oral BID Charlett Blake, MD   0.5 mg at 08/31/17 0905  . senna-docusate (Senokot-S) tablet 2 tablet  2 tablet Oral QHS Meredith Staggers, MD   2 tablet at 08/30/17 2110  . sodium phosphate (FLEET) 7-19 GM/118ML enema 1 enema  1 enema Rectal Daily PRN Meredith Staggers, MD      . sodium  phosphate (FLEET) 7-19 GM/118ML enema 1 enema  1 enema Rectal Q0600 Meredith Staggers, MD   1 enema at 08/31/17 [phone]  . sorbitol 70 % solution 30 mL  30 mL Oral Daily PRN Cathlyn Parsons, PA-C   30 mL at 08/22/17 1733  . tiZANidine (ZANAFLEX) tablet 4 mg  4 mg Oral Q6H PRN Cathlyn Parsons, PA-C   4 mg at 08/19/17 1313     Discharge Medications: Please see discharge summary for a list of discharge medications.  Relevant Imaging Results:  Relevant Lab Results:   Additional Information SS#  [id number]  Lennart Pall, LCSW

## 2017-08-31 NOTE — Progress Notes (Signed)
[facility] PHYSICAL MEDICINE & REHABILITATION     PROGRESS NOTE    Subjective/Complaints: No new issues overnight.  Had a bowel movement with Fleet enema today  ROS: limited due to language/communication   Objective: Vital Signs: Blood pressure (!) 162/67, pulse (!) 59, temperature 98.4 F (36.9 C), temperature source Oral, resp. rate 17, height 5' 9.5" (1.765 m), weight 72.7 kg (160 lb 4.4 oz), SpO2 100 %. No results found. No results for input(s): WBC, HGB, HCT, PLT in the last 72 hours. Recent Labs    08/29/17 0524  NA 132*  K 4.1  CL 97*  GLUCOSE 98  BUN 11  CREATININE 0.68  CALCIUM 8.6*   CBG (last 3)  No results for input(s): GLUCAP in the last 72 hours.  Wt Readings from Last 3 Encounters:  08/18/17 72.7 kg (160 lb 4.4 oz)    Physical Exam:  Head: Normocephalic and atraumatic.  Eyes: Right eye exhibits no discharge. Left eye exhibits no discharge. No scleral icterus.  Neck: Normal range of motion. Neck supple. No thyromegaly present.  Oral: edentulous Cardiovascular:  Brady Respiratory:CTA Bilaterally without wheezes or rales. Normal effort   GI: Soft. Bowel sounds are normal. He exhibits no distension.  Musculoskeletal: He exhibits no edema or tenderness.  Neurological: He is alert.  Patient provides his name and age.  Remains. Severely dysarthric A&Ox3 Right lean Motor: B/l UE 0/5 proximal to distal--except trace triceps B/l LE: HF 2 to 2+/5, KE 2+/5, distally 1-2/5.  No change Skin:   Uro: foreskin now reduced area.  Foley in place with clear urine Psychiatric: Anxious    Assessment/Plan: 1. 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 Physiatrist is providing close team supervision and 24 hour management of active medical problems listed below. Physiatrist and rehab team continue to assess barriers to  discharge/monitor patient progress toward functional and medical goals.  Function:  Bathing Bathing position   Position: Bed  Bathing parts   Body parts bathed by helper: Right arm, Left arm, Chest, Abdomen, Front perineal area, Buttocks, Right upper leg, Left upper leg, Right lower leg, Left lower leg, Back  Bathing assist Assist Level: 2 helpers      Upper Body Dressing/Undressing Upper body dressing   What is the patient wearing?: Pull over shirt/dress       Pull over shirt/dress - Perfomed by helper: Thread/unthread right sleeve, Thread/unthread left sleeve, Put head through opening, Pull shirt over trunk        Upper body assist Assist Level: (total assist)      Lower Body Dressing/Undressing Lower body dressing   What is the patient wearing?: Underwear, Pants, Non-skid slipper socks, Ted Hose   Underwear - Performed by helper: Thread/unthread left underwear leg, Thread/unthread right underwear leg, Pull underwear up/down   Pants- Performed by helper: Thread/unthread right pants leg, Thread/unthread left pants leg, Pull pants up/down   Non-skid slipper socks- Performed by helper: Don/doff right sock, Don/doff left sock               TED Hose - Performed by helper: Don/doff right TED hose, Don/doff left TED hose  Lower body assist Assist for lower body dressing: (total assist)      Toileting Toileting Toileting activity did not occur: No continent bowel/bladder event   Toileting steps completed by helper: Adjust clothing prior to toileting, Performs perineal hygiene, Adjust clothing after toileting  Toileting assist Assist level: Two helpers   Transfers Chair/bed transfer   Chair/bed transfer method: Squat pivot Chair/bed transfer assist level: Maximal assist (Pt 25 - 49%/lift and lower) Chair/bed transfer assistive device: Sliding board     Locomotion Ambulation Ambulation activity did not occur: Safety/medical concerns   Max distance: 68ft   Assist level: 2 helpers   Oceanographer activity did not occur: Safety/medical concerns Type: Manual(quadriparesis)      Cognition Comprehension Comprehension assist level: Understands basic 90% of the time/cues < 10% of the time  Expression Expression assist level: Expresses basic 75 - 89% of the time/requires cueing 10 - 24% of the time. Needs helper to occlude trach/needs to repeat words.  Social Interaction Social Interaction assist level: Interacts appropriately 90% of the time - Needs monitoring or encouragement for participation or interaction.  Problem Solving Problem solving assist level: Solves basic 25 - 49% of the time - needs direction more than half the time to initiate, plan or complete simple activities  Memory Memory assist level: Recognizes or recalls 50 - 74% of the time/requires cueing 25 - 49% of the time   Medical Problem List and Plan: 1.  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  DVT Prophylaxis/Anticoagulation: Subcutaneous heparin.    3. Pain Management: Hydrocodone as needed, Zanaflex 4 mg every 6 hours as needed   -Continue low-dose baclofen to help with extensor and adductor tone 4. Mood/schizophrenia: Xanax 0.25 mg twice daily as needed, no evidence of agitation,   risperdal .5mg  BID with good results 5. Neuropsych: This patient is capable of making decisions on his own behalf. 6. Skin/Wound Care: Routine skin checks 7. Fluids/Electrolytes/Nutrition: encourage PO    -, Potassium normalSodium up to 132   8. Hypotension with bradycardia with history of HTN. Bradycardia felt to be chronic. Follow-up cardiology services no  further planned workup. Echocardiogram unremarkable. Troponin negative. Monitor with increased mobility 9. Hyponatremia. See above 10. Dysphagia. Dysphagia #2 thin liquids.advance per speech therapy  11. UTI/neurogenic bladder:      -. UCX + for proteus and enterococcus--augmentin per pharm recs   -Foley replaced 12.  Neurogenic bowel: Working on bowel program with daily suppository--     -Scheduled Fleet enema affected this morning   LOS (Days) 14 A [place] T, MD 08/31/2017 8:35 AM

## 2017-08-31 NOTE — Progress Notes (Signed)
Physical Therapy Session Note  Patient Details  Name: Dean Oliver MRN: [id number] Date of Birth: [birth year]  Today's Date: 08/31/2017 PT Individual Time: 0900-1000 and 1415-1445 PT Individual Time Calculation (min): 60 min and 30 min (total 90 min)   Short Term Goals: Week 2:  PT Short Term Goal 1 (Week 2): Pt will perform rolling L/R with maxA +1 consistently PT Short Term Goal 2 (Week 2): Pt will perform supine>sit maxA + 1 consistently PT Short Term Goal 3 (Week 2): Pt will perform lateral scoot transfer with maxA +1 consistently PT Short Term Goal 4 (Week 2): Pt will demonstrate OOB tolerance 4 hours/day outside of therapy sessions  Skilled Therapeutic Interventions/Progress Updates: Tx 1: Pt received seated in w/c, denies pain and agreeable to treatment. Transported to/from gym [place]. Gait x10' with +2A; RLE buckled with first step requiring totalA +2 to regain balance. Tactile cueing at R knee for stance control for remainder of trial, significant scissoring during gait d/t spasticity/tone. Sit <>stand from mat table with modA. Standing balance with eva walker and +2A; able to perform repetitive LE taps to step with cues for abduction/lateral stepping pattern d/t adductor tone. Able to perform up to 10 reps each LE at a time with modA for balance and contralateral stance control. Sit <>supine totalA. Rolling R/L with max/totalA initially, reduced to S with repetition and part>whole with semi-side lying progressed to roll from supine. Hooklying PROM trunk rotation x10 reps each side to A with tone management and lumbopelvic dissociation. PROM to BUE pecs shoulder horizontal abduction and shoulder ER to reduce tone and spasticity, reduce pain. Returned to w/c modA squat pivot. Remained semi-reclined in w/c at end of session, all needs in reach. Pt engaged in music therapy throughout session to increase compliance.   Tx 2: Pt received seated in w/c, denies pain and agreeable to treatment.  Squat pivot transfers x3 during session w/c <>bed/mat table with mod/maxA. Sit <>stand mod/maxA from mat table. Performed LLE toe taps to 1" step with several attempts required before successful d/t difficulty following commands, RLE instability during stance, and decreased volitional effort noted compared to AM session, likely d/t fatigue. Ultimately able to perform 5 LLE taps in a row with modA for balance and tactile cueing at R knee for stance control. Returned to bed with squat pivot as above; totalA +2 sit >supine. Remained seated in bed at end of session, all needs in reach.      Therapy Documentation Precautions:  Precautions Precautions: Fall, Cervical Required Braces or Orthoses: Cervical Brace Cervical Brace: Hard collar, Other (comment)(okay to don in sitting) Restrictions Weight Bearing Restrictions: No    See Function Navigator for Current Functional Status.   Therapy/Group: Individual Therapy  Luberta Mutter 08/31/2017, 9:57 AM

## 2017-08-31 NOTE — Progress Notes (Signed)
Social Work Patient ID: Dean Oliver, male   DOB: 08-20-1946, 71 y.o.   MRN: [id number]   Have begun SNF bed search and family aware.  Will keep team/ family updated on progress.  [name], LCSW

## 2017-08-31 NOTE — Progress Notes (Signed)
Occupational Therapy Note  Patient Details  Name: FRANCHOT POLLITT MRN: [id number] Date of Birth: 02-07-1946  Today's Date: 08/31/2017 OT Individual Time: 1330-1400 OT Individual Time Calculation (min): 30 min  and Today's Date: 08/31/2017 OT Missed Time: 15 Minutes Missed Time Reason: Patient unwilling/refused to participate without medical reason  Pt c/o "soreness" in BUE; stretching and repositioned Individual Therapy  Pt resting in w/c upon arrival.  Pt initially refused therapy but agreeable after 30 mins.  Focus on BUE PROM/AAROM. Pt noted with ongoing increased internal rotators tone (R>L). RUE: shoulder flexion-90 degrees; abduction-45 degrees; LUE: Shoulder flexion-135 degrees, abduction-75 degrees.     [name] [facility] 08/31/2017, 2:46 PM

## 2017-09-01 ENCOUNTER — Inpatient Hospital Stay (HOSPITAL_COMMUNITY): Payer: Medicare Other | Admitting: Occupational Therapy

## 2017-09-01 ENCOUNTER — Inpatient Hospital Stay (HOSPITAL_COMMUNITY): Payer: Medicare Other | Admitting: Physical Therapy

## 2017-09-01 ENCOUNTER — Inpatient Hospital Stay (HOSPITAL_COMMUNITY): Payer: Medicare Other

## 2017-09-01 NOTE — Progress Notes (Signed)
Physical Therapy Weekly Progress Note  Patient Details  Name: Dean Oliver MRN: [id number] Date of Birth: [birth year]  Beginning of progress report period: August 25, 2017 End of progress report period: September 01, 2017  Today's Date: 09/01/2017 PT Individual Time: 1300-1400 PT Individual Time Calculation (min): 60 min   Patient has met 4 of 4 short term goals.  Pt currently requires min>maxA for bed mobility depending on support surface, mod/maxA for squat pivot transfers, maxA +2 for gait. Limited by adductor/extensor tone in LEs, minimal activation in BUEs, pain and decreased endurance. Patient motivation to participate has improved since admission due to improving pain control and ongoing education regarding goals and purpose of therapy.  Patient continues to demonstrate the following deficits muscle weakness, muscle joint tightness and muscle paralysis, decreased cardiorespiratoy endurance, impaired timing and sequencing, abnormal tone, unbalanced muscle activation and decreased coordination, decreased problem solving and delayed processing and decreased sitting balance, decreased standing balance, decreased postural control and decreased balance strategies and therefore will continue to benefit from skilled PT intervention to increase functional independence with mobility.  Patient progressing toward long term goals..  Continue plan of care.  PT Short Term Goals Week 2:  PT Short Term Goal 1 (Week 2): Pt will perform rolling L/R with maxA +1 consistently PT Short Term Goal 1 - Progress (Week 2): Met PT Short Term Goal 2 (Week 2): Pt will perform supine>sit maxA + 1 consistently PT Short Term Goal 2 - Progress (Week 2): Met PT Short Term Goal 3 (Week 2): Pt will perform lateral scoot transfer with maxA +1 consistently PT Short Term Goal 3 - Progress (Week 2): Met PT Short Term Goal 4 (Week 2): Pt will demonstrate OOB tolerance 4 hours/day outside of therapy sessions PT Short Term  Goal 4 - Progress (Week 2): Met Week 3:  PT Short Term Goal 1 (Week 3): =LTG due to estimated LOS  Skilled Therapeutic Interventions/Progress Updates: Pt received supine in bed, denies pain and agreeable to treatment. TotalA supine>sit. Transfer bed>w/c maxA +2. Performed standing frame x2 trials at 3-5 min each; demonstrates orthostasis however asymptomatic. BP sitting 116/68, standing at 3 min 104/50. During rest breaks, assisted pt with eating small snack at holiday patient party. Pt directed therapist in making christmas card, and therapist wrote note that pt transcribed. Remained semi-reclined in w/c at end of session, all needs in reach.      Therapy Documentation Precautions:  Precautions Precautions: Fall, Cervical Required Braces or Orthoses: Cervical Brace Cervical Brace: Hard collar, Other (comment)(okay to don in sitting) Restrictions Weight Bearing Restrictions: No   See Function Navigator for Current Functional Status.  Therapy/Group: Individual Therapy  Luberta Mutter 09/01/2017, 1:57 PM

## 2017-09-01 NOTE — Progress Notes (Signed)
Occupational Therapy Session Note  Patient Details  Name: Dean Oliver MRN: [id number] Date of Birth: 10-05-1945  Today's Date: 09/01/2017 OT Individual Time: 0800-0900 OT Individual Time Calculation (min): 60 min    Short Term Goals: Week 2:  OT Short Term Goal 1 (Week 2): Pt will transfer to toilet/ BSC with mod A +1 with LRAD OT Short Term Goal 2 (Week 2): Pt will perform one grooming task with max A with HOH A OT Short Term Goal 3 (Week 2): Pt will perform rolling right / left in bed with mod A for LB dressing  OT Short Term Goal 4 (Week 2): Pt will perform sit to stand in prep for ADL task with mod A +2  Skilled Therapeutic Interventions/Progress Updates:    OT intervention with focus on bed mobility, sitting balance, and functional transfers.  Pt follows one step commands such as "raise knees" and "please roll" to facilitate donning of Ted hose and Ace Wraps. Pt requires max A for rolling in bed and tot A for all dressing tasks.  BUE stretching performed.  Pt continues to exhibit significant BUE tone (internal rotators). Pt requires tot A for supine>sit EOB but is able to maintain sitting balance at supervision level.  Pt requires max A for squat pivot transfer. Pt remained in w/c with all needs within reach.   Therapy Documentation Precautions:  Precautions Precautions: Fall, Cervical Required Braces or Orthoses: Cervical Brace Cervical Brace: Hard collar, Other (comment)(okay to don in sitting) Restrictions Weight Bearing Restrictions: No  Pain: Pt c/o soreness in BLE and shoulders; muscle rub applied and stretching See Function Navigator for Current Functional Status.   Therapy/Group: Individual Therapy  [name] 09/01/2017, 9:05 AM

## 2017-09-01 NOTE — Progress Notes (Signed)
[place] PHYSICAL MEDICINE & REHABILITATION     PROGRESS NOTE    Subjective/Complaints: No new complaints. Resting quiet in bed.   ROS: Limited due to cognitive/behavioral   Objective: Vital Signs: Blood pressure (!) 144/69, pulse (!) 55, temperature 98.4 F (36.9 C), temperature source Oral, resp. rate 16, height 5' 9.5" (1.765 m), weight 72.7 kg (160 lb 4.4 oz), SpO2 100 %. No results found. No results for input(s): WBC, HGB, HCT, PLT in the last 72 hours. No results for input(s): NA, K, CL, GLUCOSE, BUN, CREATININE, CALCIUM in the last 72 hours.  Invalid input(s): CO CBG (last 3)  No results for input(s): GLUCAP in the last 72 hours.  Wt Readings from Last 3 Encounters:  08/18/17 72.7 kg (160 lb 4.4 oz)    Physical Exam:  Head: Normocephalic and atraumatic.  Eyes: Right eye exhibits no discharge. Left eye exhibits no discharge. No scleral icterus.  Neck: Normal range of motion. Neck supple. No thyromegaly present.  Oral: edentulous Cardiovascular:  Loletha Grayer Respiratory: CTA Bilaterally without wheezes or rales. Normal effort   GI: Soft. Bowel sounds are normal. He exhibits no distension.  Musculoskeletal: He exhibits no edema or tenderness.  Neurological: He is alert.  Patient provides his name and age.  Remains. Severely dysarthric A&Ox3 Right lean Motor: B/l UE 0/5 proximal to distal--trace triceps B/l LE: HF 2 to 2+/5, KE 2+/5, distally 1-2/5.  No change Skin:   Uro: foreskin now reduced area.  Foley in place with clear urine Psychiatric: Anxious    Assessment/Plan: 1. 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 Physiatrist is providing close team supervision and 24 hour management of active medical problems listed below. Physiatrist and rehab team continue to assess barriers to discharge/monitor patient progress toward  functional and medical goals.  Function:  Bathing Bathing position   Position: Bed  Bathing parts   Body parts bathed by helper: Right arm, Left arm, Chest, Abdomen, Front perineal area, Buttocks, Right upper leg, Left upper leg, Right lower leg, Left lower leg, Back  Bathing assist Assist Level: 2 helpers      Upper Body Dressing/Undressing Upper body dressing   What is the patient wearing?: Pull over shirt/dress       Pull over shirt/dress - Perfomed by helper: Thread/unthread right sleeve, Thread/unthread left sleeve, Put head through opening, Pull shirt over trunk        Upper body assist Assist Level: (total assist)      Lower Body Dressing/Undressing Lower body dressing   What is the patient wearing?: Underwear, Pants, Non-skid slipper socks, Ted Hose   Underwear - Performed by helper: Thread/unthread left underwear leg, Thread/unthread right underwear leg, Pull underwear up/down   Pants- Performed by helper: Thread/unthread right pants leg, Thread/unthread left pants leg, Pull pants up/down   Non-skid slipper socks- Performed by helper: Don/doff right sock, Don/doff left sock               TED Hose - Performed by helper: Don/doff right TED hose, Don/doff left TED hose  Lower body assist Assist for lower body dressing: (total assist)      Toileting Toileting Toileting activity did not occur: No continent bowel/bladder event   Toileting steps completed by helper: Adjust clothing prior to toileting, Performs perineal hygiene, Adjust clothing after toileting    Toileting assist Assist level: Two helpers   Transfers Chair/bed transfer  Chair/bed transfer method: Squat pivot Chair/bed transfer assist level: Moderate assist (Pt 50 - 74%/lift or lower) Chair/bed transfer assistive device: Sliding board     Locomotion Ambulation Ambulation activity did not occur: Safety/medical concerns   Max distance: 10 Assist level: 2 helpers   Oceanographer  activity did not occur: Safety/medical concerns Type: Manual(quadriparesis)      Cognition Comprehension Comprehension assist level: Understands basic 90% of the time/cues < 10% of the time  Expression Expression assist level: Expresses basic 75 - 89% of the time/requires cueing 10 - 24% of the time. Needs helper to occlude trach/needs to repeat words.  Social Interaction Social Interaction assist level: Interacts appropriately 90% of the time - Needs monitoring or encouragement for participation or interaction.  Problem Solving Problem solving assist level: Solves basic 25 - 49% of the time - needs direction more than half the time to initiate, plan or complete simple activities  Memory Memory assist level: Recognizes or recalls 50 - 74% of the time/requires cueing 25 - 49% of the time   Medical Problem List and Plan: 1.  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  DVT Prophylaxis/Anticoagulation: Subcutaneous heparin.    3. Pain Management: Hydrocodone as needed, Zanaflex 4 mg every 6 hours as needed   -Continue low-dose baclofen to help with extensor and adductor tone 4. Mood/schizophrenia: Xanax 0.25 mg twice daily as needed, no evidence of agitation,   risperdal .5mg  BID with good results 5. Neuropsych: This patient is capable of making decisions on his own behalf. 6. Skin/Wound Care: Routine skin checks 7. Fluids/Electrolytes/Nutrition: encourage PO    -, Potassium normalSodium up to 132   8. Hypotension with bradycardia with history of HTN. Bradycardia felt to be chronic. Follow-up cardiology services no further planned workup. Echocardiogram unremarkable. Troponin  negative. Monitor with increased mobility 9. Hyponatremia. See above 10. Dysphagia. Dysphagia #2 thin liquids.advance per speech therapy  11. UTI/neurogenic bladder:      -. UCX + for proteus and enterococcus--augmentin completed   -Foley replaced 12.  Neurogenic bowel: Working on bowel program with daily suppository--     -Scheduled Fleet enema  qam   LOS (Days) 15 A FACE TO [place] T, MD 09/01/2017 8:45 AM

## 2017-09-01 NOTE — Progress Notes (Signed)
Occupational Therapy Session Note  Patient Details  Name: Dean Oliver MRN: [id number] Date of Birth: June 14, 1946  Today's Date: 09/01/2017 OT Individual Time: 1015-1130 OT Individual Time Calculation (min): 75 min    Short Term Goals: Week 2:  OT Short Term Goal 1 (Week 2): Pt will transfer to toilet/ BSC with mod A +1 with LRAD OT Short Term Goal 2 (Week 2): Pt will perform one grooming task with max A with HOH A OT Short Term Goal 3 (Week 2): Pt will perform rolling right / left in bed with mod A for LB dressing  OT Short Term Goal 4 (Week 2): Pt will perform sit to stand in prep for ADL task with mod A +2  Skilled Therapeutic Interventions/Progress Updates:    Pt presented seated in TIS w/c, ready for OT tx session. Pt transported to therapy gym via w/c. Focus of session on general strengthening, functional transfers, static/dynamic balance. Pt completed sit<>stand at [facility] with MaxA +2 and with manual facilitation for bil UE placement, completing x3. Pt able to maintain standing approx 30-45 seconds, initially with Mod-MaxA, though able to maintain with intermittent MinA (+2 for safety), multimodal cues for maintaining upright posture. Pt transitioned to mat table via stedy where Pt completed dynamic sitting activity leaning to each side, facilitating wt bearing through R and L elbows on mat table, Pt coming back to midline with MinA. Pt completed seated toe tapping on step placed in front, alternating between L and R LE. Pt requires increased assist and cues to maintain upright posture and as Pt leaning posteriorly during activity due to increased fatigue. Pt completed squat pivot to w/c with ModA +2, engaging in bil UE stretching while seated. Pt reports need to have brief changed, transported back to room, MaxA for squat pivot w/c>EOB and ModA to return to supine. Pt completed rolling side to side with MaxA for brief change. Pt left supine in bed, music playing, call bell and needs within  reach.   Therapy Documentation Precautions:  Precautions Precautions: Fall, Cervical Required Braces or Orthoses: Cervical Brace Cervical Brace: Hard collar, Other (comment)(okay to don in sitting) Restrictions Weight Bearing Restrictions: No   Pain: Pain Assessment Pain Assessment: No/denies pain ADL: ADL ADL Comments: see functional navigator  See Function Navigator for Current Functional Status.   Therapy/Group: Individual Therapy  Raymondo Band 09/01/2017, 4:34 PM

## 2017-09-02 ENCOUNTER — Inpatient Hospital Stay (HOSPITAL_COMMUNITY): Payer: Medicare Other | Admitting: Physical Therapy

## 2017-09-02 ENCOUNTER — Inpatient Hospital Stay (HOSPITAL_COMMUNITY): Payer: Medicare Other

## 2017-09-02 NOTE — Progress Notes (Signed)
Occupational Therapy Session Note  Patient Details  Name: Dean Oliver MRN: [id number] Date of Birth: 08/21/1946  Today's Date: 09/02/2017 OT Individual Time: 0800-0900 OT Individual Time Calculation (min): 60 min    Short Term Goals: Week 2:  OT Short Term Goal 1 (Week 2): Pt will transfer to toilet/ BSC with mod A +1 with LRAD OT Short Term Goal 2 (Week 2): Pt will perform one grooming task with max A with HOH A OT Short Term Goal 3 (Week 2): Pt will perform rolling right / left in bed with mod A for LB dressing  OT Short Term Goal 4 (Week 2): Pt will perform sit to stand in prep for ADL task with mod A +2  Skilled Therapeutic Interventions/Progress Updates:    OT intervention with initial focus on pt directing care with eating breakfast.  Pt requires min verbal cues for swallowing precautions.  Focus transitioned to bed mobility and sitting balance.  Pt initiates rolling R/L in bed but requires max A to complete task.  Pt is able to raise B knees and initiate bridge in bed to facilitate pulling pants over hips.  Pt remained in bed with BUE resting on pillows and soft call bed in place beside head; music playing on computer.  Therapy Documentation Precautions:  Precautions Precautions: Fall, Cervical Required Braces or Orthoses: Cervical Brace Cervical Brace: Hard collar, Other (comment)(okay to don in sitting) Restrictions Weight Bearing Restrictions: No  Pain:  Pt c/o soreness in BUE and neck; muscle rub applied  See Function Navigator for Current Functional Status.   Therapy/Group: Individual Therapy  [name] 09/02/2017, 10:01 AM

## 2017-09-02 NOTE — NC FL2 (Signed)
[place] LEVEL OF CARE SCREENING TOOL     IDENTIFICATION  Patient Name: Dean Oliver Birthdate: [birth year] Sex: male Admission Date (Current Location): 08/17/2017  [place] and [place] Number:  [place] [id number] [place] and Address:  [facility] [place] [place], [place] [address] [place],  [address]      Provider Number: [id number]  Attending Physician Name and Address:  Meredith Staggers, MD  Relative Name and Phone Number:  [name] (legal guardian) [phone]    Current Level of Care: Other (Comment)(Acute Inpatient Rehab) Recommended Level of Care: [place] Prior Approval Number:    Date Approved/Denied:   PASRR Number: [id number]  Discharge Plan: SNF    Current Diagnoses: Patient Active Problem List   Diagnosis Date Noted  . Neurogenic bladder 08/18/2017  . Neurogenic bowel 08/18/2017  . Bacterial UTI 08/18/2017  . Central cord syndrome ([place]) 08/17/2017  . Dysphagia   . Fall   . Hypotension   . Sinus bradycardia   . HNP (herniated nucleus pulposus) with myelopathy, cervical   . Benign essential HTN   . Hyponatremia   . Acute blood loss anemia   . Symptomatic bradycardia 08/12/2017  . Schizophrenia ([place])     Orientation RESPIRATION BLADDER Height & Weight     Self, Place  Normal Indwelling catheter Weight: 72.7 kg (160 lb 4.4 oz) Height:  5' 9.5" (176.5 cm)(stated)  BEHAVIORAL SYMPTOMS/MOOD NEUROLOGICAL BOWEL NUTRITION STATUS      Incontinent Diet(Dys 3, thin liquids)  AMBULATORY STATUS COMMUNICATION OF NEEDS Skin   Extensive Assist Verbally Other (Comment)(bacitracin to foreskin; foam to sacrum )                       Personal Care Assistance Level of Assistance  Bathing, Feeding, Dressing, Total care Bathing Assistance: Maximum assistance Feeding assistance: Limited assistance Dressing Assistance: Maximum assistance Total Care Assistance: Maximum assistance   Functional Limitations  Info             SPECIAL CARE FACTORS FREQUENCY  PT (By licensed PT), OT (By licensed OT)     PT Frequency: 5x/wk OT Frequency: 5x/wk            Contractures Contractures Info: Not present    Additional Factors Info  Psychotropic     Psychotropic Info: risperidal         Current Medications (09/02/2017):  This is the current hospital active medication list Current Facility-Administered Medications  Medication Dose Route Frequency Provider Last Rate Last Dose  . acetaminophen (TYLENOL) tablet 650 mg  650 mg Oral Q4H PRN Cathlyn Parsons, PA-C   650 mg at 09/02/17 1114  . ALPRAZolam Duanne Moron) tablet 0.25 mg  0.25 mg Oral BID PRN Cathlyn Parsons, PA-C   0.25 mg at 08/27/17 2112  . aspirin EC tablet 81 mg  81 mg Oral Daily Cathlyn Parsons, PA-C   81 mg at 09/02/17 1114  . bacitracin ointment   Topical BID Ardis Hughs, MD      . baclofen (LIORESAL) tablet 5 mg  5 mg Oral BID Meredith Staggers, MD   5 mg at 09/02/17 1115  . heparin injection 5,000 Units  5,000 Units Subcutaneous Q8H Cathlyn Parsons, PA-C   5,000 Units at 09/02/17 1437  . HYDROcodone-acetaminophen (NORCO/VICODIN) 5-325 MG per tablet 1-2 tablet  1-2 tablet Oral Q6H PRN Cathlyn Parsons, PA-C   1 tablet at 08/20/17 2036  . MUSCLE RUB CREA  Topical PRN Angiulli, Lavon Paganini, PA-C      . nitroGLYCERIN (NITROSTAT) SL tablet 0.4 mg  0.4 mg Sublingual Q5 Min x 3 PRN Angiulli, Lavon Paganini, PA-C      . ondansetron [facility]) tablet 4 mg  4 mg Oral Q6H PRN Angiulli, Lavon Paganini, PA-C       Or  . ondansetron [facility]) injection 4 mg  4 mg Intravenous Q6H PRN Angiulli, Lavon Paganini, PA-C      . risperiDONE (RISPERDAL) tablet 0.5 mg  0.5 mg Oral BID Charlett Blake, MD   0.5 mg at 09/02/17 1114  . senna-docusate (Senokot-S) tablet 2 tablet  2 tablet Oral QHS Meredith Staggers, MD   2 tablet at 09/01/17 2033  . sodium phosphate (FLEET) 7-19 GM/118ML enema 1 enema  1 enema Rectal Daily PRN Meredith Staggers, MD      .  sodium phosphate (FLEET) 7-19 GM/118ML enema 1 enema  1 enema Rectal Q0600 Meredith Staggers, MD   1 enema at 09/02/17 [phone]  . sorbitol 70 % solution 30 mL  30 mL Oral Daily PRN Cathlyn Parsons, PA-C   30 mL at 09/02/17 1120  . tiZANidine (ZANAFLEX) tablet 4 mg  4 mg Oral Q6H PRN Cathlyn Parsons, PA-C   4 mg at 08/19/17 1313     Discharge Medications: Please see discharge summary for a list of discharge medications.  Relevant Imaging Results:  Relevant Lab Results:   Additional Information SS#  [id number]  Lennart Pall, LCSW

## 2017-09-02 NOTE — Progress Notes (Signed)
[place] PHYSICAL MEDICINE & REHABILITATION     PROGRESS NOTE    Subjective/Complaints: No new issues  ROS: Limited due to cognitive/behavioral   Objective: Vital Signs: Blood pressure (!) 124/56, pulse (!) 54, temperature 98.9 F (37.2 C), temperature source Oral, resp. rate 19, height 5' 9.5" (1.765 m), weight 72.7 kg (160 lb 4.4 oz), SpO2 100 %. No results found. No results for input(s): WBC, HGB, HCT, PLT in the last 72 hours. No results for input(s): NA, K, CL, GLUCOSE, BUN, CREATININE, CALCIUM in the last 72 hours.  Invalid input(s): CO CBG (last 3)  No results for input(s): GLUCAP in the last 72 hours.  Wt Readings from Last 3 Encounters:  08/18/17 72.7 kg (160 lb 4.4 oz)    Physical Exam:  Head: Normocephalic and atraumatic.  Eyes: Right eye exhibits no discharge. Left eye exhibits no discharge. No scleral icterus.  Neck: Normal range of motion. Neck supple. No thyromegaly present.  Oral: edentulous Cardiovascular:  brady Respiratory: CTA Bilaterally without wheezes or rales. Normal effort    GI: Soft. Bowel sounds are normal. He exhibits no distension.  Musculoskeletal: He exhibits no edema or tenderness.  Neurological: He is alert.  Patient provides his name and age.  Remains. Severely dysarthric A&Ox3 Right lean Motor: B/l UE 0/5 proximal to distal--trace triceps B/l LE: HF 2 to 2+/5, KE 2+/5, distally 1-2/5.  No changes Skin:   Uro: foreskin now reduced area.  Foley in place with clear urine Psychiatric: Anxious    Assessment/Plan: 1. 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 Physiatrist is providing close team supervision and 24 hour management of active medical problems listed below. Physiatrist and rehab team continue to assess barriers to discharge/monitor patient progress toward functional and medical  goals.  Function:  Bathing Bathing position   Position: Bed  Bathing parts   Body parts bathed by helper: Right arm, Left arm, Chest, Abdomen, Front perineal area, Buttocks, Right upper leg, Left upper leg, Right lower leg, Left lower leg, Back  Bathing assist Assist Level: 2 helpers      Upper Body Dressing/Undressing Upper body dressing   What is the patient wearing?: Pull over shirt/dress       Pull over shirt/dress - Perfomed by helper: Thread/unthread right sleeve, Thread/unthread left sleeve, Put head through opening, Pull shirt over trunk        Upper body assist Assist Level: (total assist)      Lower Body Dressing/Undressing Lower body dressing   What is the patient wearing?: Underwear, Pants, Non-skid slipper socks, Ted Hose   Underwear - Performed by helper: Thread/unthread left underwear leg, Thread/unthread right underwear leg, Pull underwear up/down   Pants- Performed by helper: Thread/unthread right pants leg, Thread/unthread left pants leg, Pull pants up/down   Non-skid slipper socks- Performed by helper: Don/doff right sock, Don/doff left sock               TED Hose - Performed by helper: Don/doff right TED hose, Don/doff left TED hose  Lower body assist Assist for lower body dressing: (total assist)      Toileting Toileting Toileting activity did not occur: No continent bowel/bladder event   Toileting steps completed by helper: Adjust clothing prior to toileting, Performs perineal hygiene, Adjust clothing after toileting    Toileting assist Assist level: Two helpers   Transfers Chair/bed transfer   Chair/bed transfer method:  Squat pivot Chair/bed transfer assist level: Moderate assist (Pt 50 - 74%/lift or lower) Chair/bed transfer assistive device: Sliding board     Locomotion Ambulation Ambulation activity did not occur: Safety/medical concerns   Max distance: 10 Assist level: 2 helpers   Oceanographer activity did not occur:  Safety/medical concerns Type: Manual(quadriparesis)      Cognition Comprehension Comprehension assist level: Understands basic 90% of the time/cues < 10% of the time  Expression Expression assist level: Expresses basic 75 - 89% of the time/requires cueing 10 - 24% of the time. Needs helper to occlude trach/needs to repeat words.  Social Interaction Social Interaction assist level: Interacts appropriately 90% of the time - Needs monitoring or encouragement for participation or interaction.  Problem Solving Problem solving assist level: Solves basic 25 - 49% of the time - needs direction more than half the time to initiate, plan or complete simple activities  Memory Memory assist level: Recognizes or recalls 50 - 74% of the time/requires cueing 25 - 49% of the time   Medical Problem List and Plan: 1.  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 SNF pending    - bilateral WHO's and PRAFO's to be worn at rest   -Therapy intensity decreased to 15 hours over 7 days per therapy max assist to stand but does have some proximal muscle strength which he can utilize in a standing position  2.  DVT Prophylaxis/Anticoagulation: Subcutaneous heparin.    3. Pain Management: Hydrocodone as needed, Zanaflex 4 mg every 6 hours as needed   -Continue low-dose baclofen to help with extensor and adductor tone 4. Mood/schizophrenia: Xanax 0.25 mg twice daily as needed, no evidence of agitation,   risperdal .5mg  BID with good results 5. Neuropsych: This patient is capable of making decisions on his own behalf. 6. Skin/Wound Care: Routine skin checks 7. Fluids/Electrolytes/Nutrition: encourage PO    - Potassium normalSodium up to 132   8. Hypotension with bradycardia with history of HTN. Bradycardia felt to be chronic. Follow-up cardiology services no further planned workup. Echocardiogram unremarkable. Troponin negative. Monitor with  increased mobility 9. Hyponatremia. See above 10. Dysphagia. Dysphagia #2 thin liquids.advance per speech therapy  11. UTI/neurogenic bladder:      - UCX + for proteus and enterococcus--augmentin completed   -Foley replaced 12.  Neurogenic bowel: Working on bowel program with daily suppository--     -Scheduled Fleet enema  qam   LOS (Days) 16 A FACE TO [place] T, MD 09/02/2017 9:05 AM

## 2017-09-02 NOTE — Progress Notes (Signed)
Patient having amber colored fluid with sediment, mucous and strong odor p treatment of UTI with cipro. Dan Angiullil PAC notified of status and await new orders. Foley placed during treatment of UTI due to repeat catheterizations for urinary retention. Continue to encourage fluids and diligent foley care rendered. [name]

## 2017-09-02 NOTE — Progress Notes (Addendum)
Physical Therapy Session Note  Patient Details  Name: Dean Oliver MRN: [id number] Date of Birth: 08/29/1946  Today's Date: 09/02/2017 PT Individual Time: 1000-1100 and 1415-1440 PT Individual Time Calculation (min): 60 min and 25 min (total 85 min)   Short Term Goals: Week 3:  PT Short Term Goal 1 (Week 3): =LTG due to estimated LOS  Skilled Therapeutic Interventions/Progress Updates: Tx 1: Pt received seated in bed, denies pain and agreeable to treatment. Rolling modA and pt assisting moving LEs off EOB; maxA sidelying>sit. Transfer bed>w/c with stedy and +2A for boosting hips and maintaining hands on grab bar. Gait x2 trials, x10' and x20' with +2A "three muskateers" and w/c follow; improving LE placement with cues for wide BOS. One episode of BLE buckling requiring totalA +2 and assist to safely return to sitting in w/c. Squat pivot transfer w/c <>mat table modA. Dynamic sitting balance with lateral leans to elbow propped on pillow and returning to midline for focus on core strengthening and coordination for carryover into bed mobility. Sit >supine modA. BLE hook lying trunk rotation with passive overstretch to A with tone management and lumbopelvic dissociation. BUE PROM to pecs, shoulder ER. Returned to sitting modA for rolling to R, maxA sidelying>sit with good carryover of trunk activation from earlier trials. Returned to w/c as above; remained semi-reclined in w/c at end of session, all needs in reach. Participated in music therapy throughout session to increase compliance.   Tx 2: Pt received seated in w/c, denies pain and agreeable to treatment however requesting to return to bed. Transfer w/c >bed modA squat pivot. Sit >supine maxA. Began performing PROM to LEs and pt reports incontinent of stool. Performed bridging to doff pants, rolling R/L modA to doff/don brief and perform hygiene. PROM to BLE x30 sec-1 min per tolerance, including gastroc, soleus, hamstrings, glutes, hip IR/ER.  Remained supine in bed at end of session, all needs in reach and RN present.      Therapy Documentation Precautions:  Precautions Precautions: Fall, Cervical Required Braces or Orthoses: Cervical Brace Cervical Brace: Hard collar, Other (comment)(okay to don in sitting) Restrictions Weight Bearing Restrictions: No  See Function Navigator for Current Functional Status.   Therapy/Group: Individual Therapy  Luberta Mutter 09/02/2017, 10:59 AM

## 2017-09-03 ENCOUNTER — Inpatient Hospital Stay (HOSPITAL_COMMUNITY): Payer: Medicare Other | Admitting: Occupational Therapy

## 2017-09-03 ENCOUNTER — Inpatient Hospital Stay (HOSPITAL_COMMUNITY): Payer: Medicare Other | Admitting: Physical Therapy

## 2017-09-03 NOTE — Progress Notes (Signed)
[place] PHYSICAL MEDICINE & REHABILITATION     PROGRESS NOTE    Subjective/Complaints: Slept well per nurse.  No new problems reported.  Has pain at times  ROS: Limited due to cognitive/behavioral   Objective: Vital Signs: Blood pressure 139/72, pulse (!) 59, temperature 98.5 F (36.9 C), temperature source Oral, resp. rate 18, height 5' 9.5" (1.765 m), weight 72.7 kg (160 lb 4.4 oz), SpO2 97 %. No results found. No results for input(s): WBC, HGB, HCT, PLT in the last 72 hours. No results for input(s): NA, K, CL, GLUCOSE, BUN, CREATININE, CALCIUM in the last 72 hours.  Invalid input(s): CO CBG (last 3)  No results for input(s): GLUCAP in the last 72 hours.  Wt Readings from Last 3 Encounters:  08/18/17 72.7 kg (160 lb 4.4 oz)    Physical Exam:  Head: Normocephalic and atraumatic.  Eyes: Right eye exhibits no discharge. Left eye exhibits no discharge. No scleral icterus.  Neck: Normal range of motion. Neck supple. No thyromegaly present.  Oral: edentulous Cardiovascular: Bradycardic without JVD Respiratory: CTA Bilaterally without wheezes or rales. Normal effort  GI: Soft. Bowel sounds are normal. He exhibits no distension.  Musculoskeletal: He exhibits no edema or tenderness.  Neurological: He is alert.  Patient provides his name and age.    Remains severely dysarthric A&Ox3 Right lean Motor: B/l UE 0/5 proximal to distal--trace triceps--no changes B/l LE: HF 2 to 2+/5, KE 2+/5, distally 1-2/5.  No changes Skin:   Uro: foreskin now reduced area.  Foley in place with clear urine Psychiatric: Anxious    Assessment/Plan: 1. 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 Physiatrist is providing close team supervision and 24 hour management of active medical problems listed below. Physiatrist and rehab team continue to assess barriers  to discharge/monitor patient progress toward functional and medical goals.  Function:  Bathing Bathing position   Position: Bed  Bathing parts   Body parts bathed by helper: Right arm, Left arm, Chest, Abdomen, Front perineal area, Buttocks, Right upper leg, Left upper leg, Right lower leg, Left lower leg, Back  Bathing assist Assist Level: 2 helpers      Upper Body Dressing/Undressing Upper body dressing   What is the patient wearing?: Pull over shirt/dress       Pull over shirt/dress - Perfomed by helper: Thread/unthread right sleeve, Thread/unthread left sleeve, Put head through opening, Pull shirt over trunk        Upper body assist Assist Level: (total assist)      Lower Body Dressing/Undressing Lower body dressing   What is the patient wearing?: Underwear, Pants, Non-skid slipper socks, Ted Hose   Underwear - Performed by helper: Thread/unthread left underwear leg, Thread/unthread right underwear leg, Pull underwear up/down   Pants- Performed by helper: Thread/unthread right pants leg, Thread/unthread left pants leg, Pull pants up/down   Non-skid slipper socks- Performed by helper: Don/doff right sock, Don/doff left sock               TED Hose - Performed by helper: Don/doff right TED hose, Don/doff left TED hose  Lower body assist Assist for lower body dressing: (total assist)      Toileting Toileting Toileting activity did not occur: No continent bowel/bladder event   Toileting steps completed by helper: Adjust clothing prior to toileting, Performs perineal hygiene, Adjust clothing after toileting    Toileting assist Assist level:  Two helpers   Transfers Chair/bed transfer   Chair/bed transfer method: Squat pivot Chair/bed transfer assist level: Moderate assist (Pt 50 - 74%/lift or lower) Chair/bed transfer assistive device: Sliding board     Locomotion Ambulation Ambulation activity did not occur: Safety/medical concerns   Max distance:  20 Assist level: 2 helpers   Oceanographer activity did not occur: Safety/medical concerns Type: Manual(quadriparesis)      Cognition Comprehension Comprehension assist level: Understands basic 90% of the time/cues < 10% of the time  Expression Expression assist level: Expresses basic 75 - 89% of the time/requires cueing 10 - 24% of the time. Needs helper to occlude trach/needs to repeat words.  Social Interaction Social Interaction assist level: Interacts appropriately 90% of the time - Needs monitoring or encouragement for participation or interaction.  Problem Solving Problem solving assist level: Solves basic 25 - 49% of the time - needs direction more than half the time to initiate, plan or complete simple activities  Memory Memory assist level: Recognizes or recalls 75 - 89% of the time/requires cueing 10 - 24% of the time   Medical Problem List and Plan: 1.  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 SNF pending    - bilateral WHO's and PRAFO's to be worn at rest   -Therapy intensity decreased to 15 hours over 7 days per therapy max assist to stand but does have some proximal muscle strength which he can utilize in a standing position  2.  DVT Prophylaxis/Anticoagulation: Subcutaneous heparin.    3. Pain Management: Hydrocodone as needed, Zanaflex 4 mg every 6 hours as needed   -Continue low-dose baclofen to help with extensor and adductor tone 4. Mood/schizophrenia: Xanax 0.25 mg twice daily as needed, no evidence of agitation,   risperdal .5mg  BID with good results 5. Neuropsych: This patient is capable of making decisions on his own behalf. 6. Skin/Wound Care: Routine skin checks 7. Fluids/Electrolytes/Nutrition: encourage PO    - Potassium normalSodium up to 132   8. Hypotension with bradycardia with history of HTN. Bradycardia felt to be chronic. Follow-up cardiology services no further  planned workup. Echocardiogram unremarkable. Troponin negative. Monitor with increased mobility 9. Hyponatremia. See above 10. Dysphagia. Dysphagia #2 thin liquids.advance per speech therapy  11. UTI/neurogenic bladder:      - UCX + for proteus and enterococcus--augmentin completed   -Foley replaced 12.  Neurogenic bowel: Working on bowel program with daily suppository--     -Scheduled Fleet enema  qam   LOS (Days) [place] T, MD 09/03/2017 9:07 AM

## 2017-09-03 NOTE — Progress Notes (Signed)
Occupational Therapy Session Note  Patient Details  Name: Dean Oliver MRN: [id number] Date of Birth: 04-09-1946  Today's Date: 09/03/2017  Session 1 OT Individual Time: 0803-0900 OT Individual Time Calculation (min): 57 min   Session 2 OT Individual Time: 1215-1300 OT Individual Time Calculation (min): 45 min    Short Term Goals: Week 2:  OT Short Term Goal 1 (Week 2): Pt will transfer to toilet/ BSC with mod A +1 with LRAD OT Short Term Goal 2 (Week 2): Pt will perform one grooming task with max A with HOH A OT Short Term Goal 3 (Week 2): Pt will perform rolling right / left in bed with mod A for LB dressing  OT Short Term Goal 4 (Week 2): Pt will perform sit to stand in prep for ADL task with mod A +2  Skilled Therapeutic Interventions/Progress Updates:    Session 1 OT finished breakfast with pt with total A. B UE gentle ROM through all planes in supine. LB bathing/dressing with rolling L and R with max A for rolling but able to initiate with LEs and core strength. Total A to thread pant legs, then worked on bridging to pull pants up over hips. Sup <>sit with pt able to help advance LEs toward EOB and max A to elevate trunk. Max A squat-pivot to TIS wc. Hand over hand A to utilize UEs to wash face and upper body. Total A to thread shirt, but demonstrated improved core strength to lean forward while OT pulled down shirt. Put on pt preferred music in room for relaxation. Pt left with needs met and call bell placed beside head to push if needed.  Session 2 OT treatment session fosued on pt directing care while eating lunch, Sitting balance, and UE PROM. OT set-up food bolus on feeding utensil, then provided hand over hand A with elbow supported on pillow to bring food to mouth. Decrease in tone noted with repetition. Squat-pivot to return to bed with Max A. Worked on trunk control in sitting, then total A to return to supine. Pt directed care on how to remove c-collar, and pt left in with  needs met and bed alarm on.   Therapy Documentation Precautions:  Precautions Precautions: Fall, Cervical Required Braces or Orthoses: Cervical Brace Cervical Brace: Hard collar, Other (comment)(okay to don in sitting) Restrictions Weight Bearing Restrictions: No Pain:  none/denies pain ADL: ADL ADL Comments: see functional navigator  See Function Navigator for Current Functional Status.   Therapy/Group: Individual Therapy  Valma Cava 09/03/2017, 1:22 PM

## 2017-09-03 NOTE — Progress Notes (Signed)
Physical Therapy Session Note  Patient Details  Name: Dean Oliver MRN: [id number] Date of Birth: 11/24/1945  Today's Date: 09/03/2017 PT Individual Time: 1115-1200 PT Individual Time Calculation (min): 45 min   Short Term Goals: Week 3:  PT Short Term Goal 1 (Week 3): =LTG due to estimated LOS  Skilled Therapeutic Interventions/Progress Updates: Pt received seated in w/c, denies pain and agreeable to treatment. Gait 2 trials x30' each with +2A; modA on first trial for RLE placement to reduce scissoring and facilitation for R stance control. Second trial no assist for progression, min cues for stance control. Posterior bias in standing, requires cues to facilitate anterior weight shift over stance limb. Attempted to engaged in B scap retraction; use of multimodal cues and demonstration however pt unable to correctly activate middle traps/rhomboids for scap retraction. Returned to room after incontinent bowel movement. Sit <>stand in stedy; performed hygiene and clothing management totalA in standing, two rest breaks in sitting d/t fatigue. Remained semi-reclined in w/c at end of session, all needs in reach.      Therapy Documentation Precautions:  Precautions Precautions: Fall, Cervical Required Braces or Orthoses: Cervical Brace Cervical Brace: Hard collar, Other (comment)(okay to don in sitting) Restrictions Weight Bearing Restrictions: No   See Function Navigator for Current Functional Status.   Therapy/Group: Individual Therapy  Luberta Mutter 09/03/2017, 12:05 PM

## 2017-09-04 ENCOUNTER — Inpatient Hospital Stay (HOSPITAL_COMMUNITY): Payer: Medicare Other | Admitting: Occupational Therapy

## 2017-09-04 ENCOUNTER — Inpatient Hospital Stay (HOSPITAL_COMMUNITY): Payer: Medicare Other | Admitting: Physical Therapy

## 2017-09-04 NOTE — Progress Notes (Signed)
Occupational Therapy Session Note  Patient Details  Name: Dean Oliver MRN: [id number] Date of Birth: November 07, 1945  Today's Date: 09/04/2017 OT Individual Time: 1100-1205 and 1305-1330 OT Individual Time Calculation (min): 65 min and 25 min   Short Term Goals: Week 1:  OT Short Term Goal 1 (Week 1): Pt will transfer to toilet/ BSC with mod A +1 with LRAD OT Short Term Goal 1 - Progress (Week 1): Progressing toward goal OT Short Term Goal 2 (Week 1): Pt will sitting EOB with supervision in prep for ADL task  OT Short Term Goal 2 - Progress (Week 1): Met OT Short Term Goal 3 (Week 1): Pt will perform one grooming task with max A with HOH A OT Short Term Goal 3 - Progress (Week 1): Progressing toward goal OT Short Term Goal 4 (Week 1): Pt will perform rolling right / left in bed with mod A for LB dressing  OT Short Term Goal 4 - Progress (Week 1): Progressing toward goal OT Short Term Goal 5 (Week 1): Pt will perform sit to stand in prep for ADL task with mod A +2 OT Short Term Goal 5 - Progress (Week 1): Progressing toward goal Week 2:  OT Short Term Goal 1 (Week 2): Pt will transfer to toilet/ BSC with mod A +1 with LRAD OT Short Term Goal 2 (Week 2): Pt will perform one grooming task with max A with HOH A OT Short Term Goal 3 (Week 2): Pt will perform rolling right / left in bed with mod A for LB dressing  OT Short Term Goal 4 (Week 2): Pt will perform sit to stand in prep for ADL task with mod A +2      Skilled Therapeutic Interventions/Progress Updates:    Visit 1:  No c/o pain. Pt received in w/c stating he would like a shower.  Pt received a shower with use of roll in chair and assist from OT and rehab tech with a focus on sitting balance, using abdominals for forward flexion, and sit to stand and stand pivot skills.  Pt was able to rise to stand with mod-max A of 1 person and then stand pivoted with mod A of 2.  In the initial part of movement pt needs max A and then he can easily  rise to a full stand with mod A.  Pt stood numerous times in session to doff pants, to transfer on and off shower seat, to don briefs and don pants.  He then tolerated standing for 1 minute with lateral wt shifts with mod A while "dancing" to the music.  He continues to need total A with bathing and dressing due to limited AROM in BUE.  Visit 2:  No c/o pain. Pt received in bed and stated he was very tired and did not want to get out of bed. Worked on gentle PROM to [place].  Pt has pain with full elbow flexion and shoulder range so stretching was slow and cautious.  He has some active finger flexion in B hands.  Provided pt with soft yellow foam blocks to squeeze. Which he was able to squeeze a minimal amount.  Pt resting in bed with all needs met.  Therapy Documentation Precautions:  Precautions Precautions: Fall, Cervical Required Braces or Orthoses: Cervical Brace Cervical Brace: Hard collar, Other (comment)(okay to don in sitting) Restrictions Weight Bearing Restrictions: No     ADL: ADL ADL Comments: see functional navigator  See Function Navigator for Current Functional  Status.   Therapy/Group: Individual Therapy  [place] 09/04/2017, 1:05 PM

## 2017-09-04 NOTE — Progress Notes (Signed)
Physical Therapy Session Note  Patient Details  Name: Dean Oliver MRN: [id number] Date of Birth: 06-Feb-1946  Today's Date: 09/04/2017 PT Individual Time: 0900-1015 PT Individual Time Calculation (min): 75 min   Short Term Goals: Week 3:  PT Short Term Goal 1 (Week 3): =LTG due to estimated LOS  Skilled Therapeutic Interventions/Progress Updates: Pt received seated in bed, denies pain and agreeable to treatment. BLE TEDs and ace wraps donned totalA, pants donned maxA with pt rolling R/L modA to pull up pants. Supine>sit maxA with logroll. Transfer bed>w/c maxA squat pivot. Oral hygiene and washing face totalA at sink with pt directing care and expressing needs. Gait with eva walker x25' +2A for managing walker and stabilizing pelvis. Pt with posterior bias and difficulty progressing body forward over feet to maintain neutral standing posture d/t limited muscle length in ankle plantarflexors. Transfer mat table>w/c modA squat pivot. Standing frame performed x5 min with wedge under distal foot to provide stretch to BLE gastroc. Remained semi-reclined in w/c at end of session, all needs in reach.      Therapy Documentation Precautions:  Precautions Precautions: Fall, Cervical Required Braces or Orthoses: Cervical Brace Cervical Brace: Hard collar, Other (comment)(okay to don in sitting) Restrictions Weight Bearing Restrictions: No  See Function Navigator for Current Functional Status.   Therapy/Group: Individual Therapy  Luberta Mutter 09/04/2017, 10:45 AM

## 2017-09-04 NOTE — Progress Notes (Signed)
[place] PHYSICAL MEDICINE & REHABILITATION     PROGRESS NOTE    Subjective/Complaints: No new complaints. RN reported no issues overnight  ROS: Limited due to cognitive/behavioral   Objective: Vital Signs: Blood pressure (!) 147/77, pulse 66, temperature 99.2 F (37.3 C), temperature source Oral, resp. rate 18, height 5' 9.5" (1.765 m), weight 72.7 kg (160 lb 4.4 oz), SpO2 98 %. No results found. No results for input(s): WBC, HGB, HCT, PLT in the last 72 hours. No results for input(s): NA, K, CL, GLUCOSE, BUN, CREATININE, CALCIUM in the last 72 hours.  Invalid input(s): CO CBG (last 3)  No results for input(s): GLUCAP in the last 72 hours.  Wt Readings from Last 3 Encounters:  08/18/17 72.7 kg (160 lb 4.4 oz)    Physical Exam:  Head: Normocephalic and atraumatic.  Eyes: Right eye exhibits no discharge. Left eye exhibits no discharge. No scleral icterus.  Neck: Normal range of motion. Neck supple. No thyromegaly present.  Oral: edentulous Cardiovascular:  bradycardia  Respiratory: CTA Bilaterally without wheezes or rales. Normal effort   GI: Soft. Bowel sounds are normal. He exhibits no distension.  Musculoskeletal: He exhibits no edema or tenderness.  Neurological: He is alert.  Patient provides his name and age.    Remains severely dysarthric A&Ox3 Right lean Motor: B/l UE 0/5 proximal to distal--trace triceps--no change B/l LE: HF 2 to 2+/5, KE 2+/5, distally 1-2/5.  No change Skin:   Uro: foreskin now reduced area.  Foley in place with cloudy urine Psychiatric: Anxious    Assessment/Plan: 1. 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 Physiatrist is providing close team supervision and 24 hour management of active medical problems listed below. Physiatrist and rehab team continue to assess barriers to discharge/monitor  patient progress toward functional and medical goals.  Function:  Bathing Bathing position   Position: Bed  Bathing parts   Body parts bathed by helper: Right arm, Left arm, Chest, Abdomen, Front perineal area, Buttocks, Right upper leg, Left upper leg, Right lower leg, Left lower leg, Back  Bathing assist Assist Level: 2 helpers      Upper Body Dressing/Undressing Upper body dressing   What is the patient wearing?: Pull over shirt/dress       Pull over shirt/dress - Perfomed by helper: Thread/unthread right sleeve, Thread/unthread left sleeve, Put head through opening, Pull shirt over trunk        Upper body assist Assist Level: (total assist)      Lower Body Dressing/Undressing Lower body dressing   What is the patient wearing?: Underwear, Pants, Non-skid slipper socks, Ted Hose   Underwear - Performed by helper: Thread/unthread left underwear leg, Thread/unthread right underwear leg, Pull underwear up/down   Pants- Performed by helper: Thread/unthread right pants leg, Thread/unthread left pants leg, Pull pants up/down   Non-skid slipper socks- Performed by helper: Don/doff right sock, Don/doff left sock               TED Hose - Performed by helper: Don/doff right TED hose, Don/doff left TED hose  Lower body assist Assist for lower body dressing: (total assist)      Toileting Toileting Toileting activity did not occur: No continent bowel/bladder event   Toileting steps completed by helper: Adjust clothing prior to toileting, Performs perineal hygiene, Adjust clothing after toileting    Toileting assist Assist level: Two helpers   Transfers  Chair/bed transfer   Chair/bed transfer method: Squat pivot Chair/bed transfer assist level: Moderate assist (Pt 50 - 74%/lift or lower) Chair/bed transfer assistive device: Sliding board     Locomotion Ambulation Ambulation activity did not occur: Safety/medical concerns   Max distance: 30 Assist level: 2 helpers    Oceanographer activity did not occur: Safety/medical concerns Type: Manual(quadriparesis)      Cognition Comprehension Comprehension assist level: Understands basic 90% of the time/cues < 10% of the time  Expression Expression assist level: Expresses basic 90% of the time/requires cueing < 10% of the time.  Social Interaction Social Interaction assist level: Interacts appropriately 90% of the time - Needs monitoring or encouragement for participation or interaction.  Problem Solving Problem solving assist level: Solves basic 90% of the time/requires cueing < 10% of the time  Memory Memory assist level: Recognizes or recalls 90% of the time/requires cueing < 10% of the time   Medical Problem List and Plan: 1.  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 SNF pending    - bilateral WHO's and PRAFO's to be worn at rest   -Therapy intensity decreased to 15 hours over 7 days per therapy max assist to stand but does have some proximal muscle strength which he can utilize in a standing position  2.  DVT Prophylaxis/Anticoagulation: Subcutaneous heparin.    3. Pain Management: Hydrocodone as needed, Zanaflex 4 mg every 6 hours as needed   -Continue low-dose baclofen to help with extensor and adductor tone 4. Mood/schizophrenia: Xanax 0.25 mg twice daily as needed, no evidence of agitation,   risperdal .5mg  BID with good results 5. Neuropsych: This patient is capable of making decisions on his own behalf. 6. Skin/Wound Care: Routine skin checks 7. Fluids/Electrolytes/Nutrition: encourage PO    - Potassium normalSodium up to 132   8. Hypotension with bradycardia with history of HTN. Bradycardia felt to be chronic. Follow-up cardiology services no further planned workup. Echocardiogram unremarkable. Troponin negative. Monitor with increased mobility 9. Hyponatremia. See above 10. Dysphagia. Dysphagia #2 thin  liquids.advance per speech therapy  11. UTI/neurogenic bladder:      - UCX + for proteus and enterococcus--augmentin completed   -Foley replaced.  Urine cloudy now with odor and low-grade temperature.   -Check urine culture 12.  Neurogenic bowel: Working on bowel program with daily suppository--     -Scheduled Fleet enema  qam with more regular results   LOS (Days) [place] T, MD 09/04/2017 8:47 AM

## 2017-09-05 ENCOUNTER — Inpatient Hospital Stay (HOSPITAL_COMMUNITY): Payer: Medicare Other | Admitting: Physical Therapy

## 2017-09-05 ENCOUNTER — Inpatient Hospital Stay (HOSPITAL_COMMUNITY): Payer: Medicare Other

## 2017-09-05 ENCOUNTER — Inpatient Hospital Stay (HOSPITAL_COMMUNITY): Payer: Medicare Other | Admitting: *Deleted

## 2017-09-05 MED ORDER — SULFAMETHOXAZOLE-TRIMETHOPRIM 800-160 MG PO TABS
1.0000 | ORAL_TABLET | Freq: Two times a day (BID) | ORAL | Status: DC
Start: 2017-09-05 — End: 2017-09-07
  Administered 2017-09-05 – 2017-09-07 (×5): 1 via ORAL
  Filled 2017-09-05 (×5): qty 1

## 2017-09-05 NOTE — Progress Notes (Signed)
[facility] PHYSICAL MEDICINE & REHABILITATION     PROGRESS NOTE    Subjective/Complaints: Problems with Foley obstruction last night.  Foley was replaced and urine forthcoming.  Patient comfortable this morning.  ROS: pt denies nausea, vomiting, diarrhea, cough, shortness of breath or chest pain   Objective: Vital Signs: Blood pressure (!) 150/69, pulse 62, temperature 98.9 F (37.2 C), temperature source Oral, resp. rate 17, height 5' 9.5" (1.765 m), weight 72.7 kg (160 lb 4.4 oz), SpO2 100 %. No results found. No results for input(s): WBC, HGB, HCT, PLT in the last 72 hours. No results for input(s): NA, K, CL, GLUCOSE, BUN, CREATININE, CALCIUM in the last 72 hours.  Invalid input(s): CO CBG (last 3)  No results for input(s): GLUCAP in the last 72 hours.  Wt Readings from Last 3 Encounters:  08/18/17 72.7 kg (160 lb 4.4 oz)    Physical Exam:  Head: Normocephalic and atraumatic.  Eyes: Right eye exhibits no discharge. Left eye exhibits no discharge. No scleral icterus.  Neck: Normal range of motion. Neck supple. No thyromegaly present.  Oral: edentulous Cardiovascular: Bradycardia Respiratory: CTA Bilaterally without wheezes or rales. Normal effort  GI: Soft. Bowel sounds are normal. He exhibits no distension.  Musculoskeletal: He exhibits no edema or tenderness.  Neurological: He is alert.  Patient provides his name and age.    Remains severely dysarthric A&Ox3 Right lean Motor: B/l UE 0/5 proximal to distal--trace triceps--no change B/l LE: HF 2 to 2+/5, KE 2+/5, distally 1-2/5.  No change Skin:   Uro: foreskin now reduced area.  Foley in place with cloudy urine Psychiatric: Anxious    Assessment/Plan: 1. 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 Physiatrist is providing close team supervision and 24 hour management of active  medical problems listed below. Physiatrist and rehab team continue to assess barriers to discharge/monitor patient progress toward functional and medical goals.  Function:  Bathing Bathing position   Position: Bed  Bathing parts   Body parts bathed by helper: Right arm, Left arm, Chest, Abdomen, Front perineal area, Buttocks, Right upper leg, Left upper leg, Right lower leg, Left lower leg, Back  Bathing assist Assist Level: 2 helpers      Upper Body Dressing/Undressing Upper body dressing   What is the patient wearing?: Pull over shirt/dress       Pull over shirt/dress - Perfomed by helper: Thread/unthread right sleeve, Thread/unthread left sleeve, Put head through opening, Pull shirt over trunk        Upper body assist Assist Level: (total assist)      Lower Body Dressing/Undressing Lower body dressing   What is the patient wearing?: Underwear, Pants, Non-skid slipper socks, Ted Hose   Underwear - Performed by helper: Thread/unthread left underwear leg, Thread/unthread right underwear leg, Pull underwear up/down   Pants- Performed by helper: Thread/unthread right pants leg, Thread/unthread left pants leg, Pull pants up/down   Non-skid slipper socks- Performed by helper: Don/doff right sock, Don/doff left sock               TED Hose - Performed by helper: Don/doff right TED hose, Don/doff left TED hose  Lower body assist Assist for lower body dressing: (total assist)      Toileting Toileting Toileting activity did not occur: No continent bowel/bladder event   Toileting steps completed by helper: Adjust clothing prior to toileting, Performs perineal hygiene, Adjust  clothing after toileting    Toileting assist Assist level: Two helpers   Transfers Chair/bed transfer   Chair/bed transfer method: Squat pivot Chair/bed transfer assist level: Moderate assist (Pt 50 - 74%/lift or lower) Chair/bed transfer assistive device: Sliding board     Locomotion Ambulation  Ambulation activity did not occur: Safety/medical concerns   Max distance: 30 Assist level: 2 helpers   Oceanographer activity did not occur: Safety/medical concerns Type: Manual(quadriparesis)      Cognition Comprehension Comprehension assist level: Understands basic 90% of the time/cues < 10% of the time  Expression Expression assist level: Expresses basic 90% of the time/requires cueing < 10% of the time.  Social Interaction Social Interaction assist level: Interacts appropriately 90% of the time - Needs monitoring or encouragement for participation or interaction.  Problem Solving Problem solving assist level: Solves basic 90% of the time/requires cueing < 10% of the time  Memory Memory assist level: Recognizes or recalls 90% of the time/requires cueing < 10% of the time   Medical Problem List and Plan: 1.  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 SNF pending    - bilateral WHO's and PRAFO's to be worn at rest   -Therapy intensity decreased to 15 hours over 7 days 2.  DVT Prophylaxis/Anticoagulation: Subcutaneous heparin.    3. Pain Management: Hydrocodone as needed, Zanaflex 4 mg every 6 hours as needed   -Continue low-dose baclofen to help with extensor and adductor tone 4. Mood/schizophrenia: Xanax 0.25 mg twice daily as needed, no evidence of agitation,   risperdal .5mg  BID with good results 5. Neuropsych: This patient is capable of making decisions on his own behalf. 6. Skin/Wound Care: Routine skin checks 7. Fluids/Electrolytes/Nutrition: encourage PO    - Potassium normalSodium up to 132 most recently 8. Hypotension with bradycardia with history of HTN. Bradycardia felt to be chronic. Follow-up cardiology services no further planned workup. Echocardiogram unremarkable. Troponin negative. Monitor with increased mobility 9. Hyponatremia. See above 10. Dysphagia. Dysphagia #2 thin  liquids.advance per speech therapy  11. UTI/neurogenic bladder:      - UCX + for proteus and enterococcus--augmentin completed   -Foley replaced last night due to obstruction .  Urine cloudy now with odor and low-grade temperature at times.   -Follow-up urine culture still pending.  Holding on antibiotic until culture back 12.  Neurogenic bowel: Working on bowel program with daily suppository--     -Scheduled Fleet enema  qam with more regular results   LOS (Days) Paxton T, MD 09/05/2017 8:31 AM

## 2017-09-05 NOTE — Progress Notes (Signed)
Follow-up urine culture 09/04/2017 showing greater 100,000 Proteus sensitive to Bactrim.

## 2017-09-05 NOTE — Progress Notes (Signed)
Patient complained that he want to unable to pee, he assessed, he stomach was distended, he was bladder scan for 512 cc on-call notified, he ordered to irrigate Foley but RN was unable to irrigate Foley, an other RN was called but she too wasn't able to irrigate Foley, on-call notified again and he ordered to remove the Foley and replace it with new one. The order was carried out per protocol. We continue to monitor

## 2017-09-05 NOTE — Progress Notes (Signed)
Occupational Therapy Session Note  Patient Details  Name: Dean Oliver MRN: [id number] Date of Birth: [birth year]  Today's Date: 09/05/2017 OT Individual Time: 0700-0755 OT Individual Time Calculation (min): 55 min    Short Term Goals: Week 2:  OT Short Term Goal 1 (Week 2): Pt will transfer to toilet/ BSC with mod A +1 with LRAD OT Short Term Goal 1 - Progress (Week 2): Met OT Short Term Goal 2 (Week 2): Pt will perform one grooming task with max A with HOH A OT Short Term Goal 2 - Progress (Week 2): Progressing toward goal OT Short Term Goal 3 (Week 2): Pt will perform rolling right / left in bed with mod A for LB dressing  OT Short Term Goal 3 - Progress (Week 2): Progressing toward goal OT Short Term Goal 4 (Week 2): Pt will perform sit to stand in prep for ADL task with mod A +2 Week 3:  OT Short Term Goal 1 (Week 3): STG=LTG secondary to ELOS  Skilled Therapeutic Interventions/Progress Updates:    OT intervention with focus on bed mobility, static sitting balance, dynamic sitting balance, functional transfers, BUE PROM, and activity tolerance to increase independence with BADLs.  LTG downgraded after consultation with OTR.  Pt requires max A for rolling L/R in bed to facilitate pulling pants over hips.  Pt requires tot A for all dressing tasks but is able to raise knees and lift BLE to facilitate LB dressing.  Pt requires tot A for supine>sit EOB and remains seated at supervision level to facilitate donning shirt and C-collar.  Pt requires max A for squat pivot transfer to w/c.  Pt remain seated in w/c with all needs within reach and music on computer per pt preference.   Therapy Documentation Precautions:  Precautions Precautions: Fall, Cervical Required Braces or Orthoses: Cervical Brace Cervical Brace: Hard collar, Other (comment)(okay to don in sitting) Restrictions Weight Bearing Restrictions: No Pain:  Pt denies pain  See Function Navigator for Current Functional  Status.   Therapy/Group: Individual Therapy  [name] 09/05/2017, 7:55 AM

## 2017-09-05 NOTE — Plan of Care (Signed)
Patient unable to urinate without Foley

## 2017-09-05 NOTE — Progress Notes (Signed)
Occupational Therapy Weekly Progress Note  Patient Details  Name: Dean Oliver MRN: [id number] Date of Birth: [birth year]  Beginning of progress report period: August 25, 2017 End of progress report period: September 05, 2017   Patient has met 1 of 3 short term goals.  Pt progress has been slow but steady during the past week.  Pt continues to require tot A for bathing/dressing tasks.  Trace finger flexion noted in BUE in addition to trace elbow flexion.  Pt follows one step commands and initiates rolling R/L in bed to facilitate LB dressing tasks.  Pt directs care with min verbal cues.  Pt performs squat pivot transfers with mod A to w/c and BSC.   Patient continues to demonstrate the following deficits: muscle weakness, muscle joint tightness and muscle paralysis, abnormal tone, decreased problem solving and decreased sitting balance, decreased standing balance and decreased postural control and therefore will continue to benefit from skilled OT intervention to enhance overall performance with BADL and Reduce care partner burden.  Patient not progressing toward long term goals.  See goal revision..  Plan of care revisions:  LTG of shower transfers, eating, bathing, dressing, grooming, and dressing discontinued and goals of sitting (min) and STATIC standing ( mod) balance and toilet transfers (mod A) downgraded due to limited trunk control and severely impaired shoulder PROM and trace active movement in BUE.  Pt is unable to actively use his arms for support or assist.    OT Short Term Goals Week 2:  OT Short Term Goal 1 (Week 2): Pt will transfer to toilet/ BSC with mod A +1 with LRAD OT Short Term Goal 1 - Progress (Week 2): Met OT Short Term Goal 2 (Week 2): Pt will perform one grooming task with max A with HOH A OT Short Term Goal 2 - Progress (Week 2): Progressing toward goal OT Short Term Goal 3 (Week 2): Pt will perform rolling right / left in bed with mod A for LB dressing  OT Short  Term Goal 3 - Progress (Week 2): Progressing toward goal OT Short Term Goal 4 (Week 2): Pt will perform sit to stand in prep for ADL task with mod A +2  Week 3: STG=LTG secondary to ELOS      Therapy Documentation Precautions:  Precautions Precautions: Fall, Cervical Required Braces or Orthoses: Cervical Brace Cervical Brace: Hard collar, Other (comment)(okay to don in sitting) Restrictions Weight Bearing Restrictions: No  See Function Navigator for Current Functional Status.     [name] [facility] 09/05/2017, 6:58 AM

## 2017-09-05 NOTE — Progress Notes (Signed)
Physical Therapy Session Note  Patient Details  Name: Dean Oliver MRN: [id number] Date of Birth: 1946-04-26  Today's Date: 09/05/2017 PT Individual Time: 0900-1000 PT Individual Time Calculation (min): 60 min   Short Term Goals: Week 3:  PT Short Term Goal 1 (Week 3): =LTG due to estimated LOS  Skilled Therapeutic Interventions/Progress Updates:    Pt seated in TIS w/c in room, agreeable to participate in therapy session. Pt reports no pain this AM. Sit to stand Mod A, ambulation 2 x 15 ft, 1 x 10 ft with assist x 2 (3 muskateers), v/c and visual cues (green line on floor) to widen BOS, v/c and manual cues for upright trunk. Pt is able to complete one turn to sit after second bout of ambulation with v/c for steps and direction, pt is unable to complete turn with final bout of ambulation due to onset of fatigue and LE buckling. BP 131/68 after ambulation. Squat pivot transfer w/c to mat with Mod A, v/c and manual cues for weight shift and sequencing. Sitting balance EOM with Min A for upright posture. R/L side leans onto 2 pillows with UE x 10 reps with tactile cues and Mod A to return to midline, R/L side leans onto 1 pillow x 5 reps with tactile cues and Max A to return to midline, focus on use of triceps and lateral trunk flexors. Supine to/from sit assist x 2 for trunk and B LE. Rolling R/L with Min-Mod A mostly for upper body weight shift, pt does well rolling with hips. Supine B LE therex x 10 reps. Squat pivot transfer mat to w/c with Max A due to pt fatigue. Pt assisted back to bed at end of session with assist x 2 due to fatigue. Pt left supine in bed with needs in reach.  Therapy Documentation Precautions:  Precautions Precautions: Fall, Cervical Required Braces or Orthoses: Cervical Brace Cervical Brace: Hard collar, Other (comment)(okay to don in sitting) Restrictions Weight Bearing Restrictions: No  See Function Navigator for Current Functional Status.   Therapy/Group:  Individual Therapy  Excell Seltzer, PT, DPT  09/05/2017, 11:05 AM

## 2017-09-05 NOTE — Progress Notes (Signed)
Physical Therapy Session Note  Patient Details  Name: Dean Oliver MRN: [id number] Date of Birth: 1945-10-05  Today's Date: 09/05/2017 PT Individual Time: 1415-1445 PT Individual Time Calculation (min): 30 min   Short Term Goals: Week 3:  PT Short Term Goal 1 (Week 3): =LTG due to estimated LOS  Skilled Therapeutic Interventions/Progress Updates:    Pt semi-reclined in bed, agreeable to participate in therapy session. Pt reports no pain this PM but does have soreness in upper shoulder and neck area. Therapist applied muscle rub to shoulders and neck to improve pt comfort level. Supine B LE therex x 10 reps, AAROM with lots of verbal encouragement. Music therapy during session to encourage participation. Pt left supine in bed with call bell in reach by L UE, family present.  Therapy Documentation Precautions:  Precautions Precautions: Fall, Cervical Required Braces or Orthoses: Cervical Brace Cervical Brace: Hard collar, Other (comment)(okay to don in sitting) Restrictions Weight Bearing Restrictions: No  See Function Navigator for Current Functional Status.   Therapy/Group: Individual Therapy  Excell Seltzer, PT, DPT  09/05/2017, 3:56 PM

## 2017-09-05 NOTE — Progress Notes (Signed)
Occupational Therapy Note  Patient Details  Name: Dean Oliver MRN: [id number] Date of Birth: February 22, 1946  Today's Date: 09/05/2017 OT Individual Time: 1300-1340 OT Individual Time Calculation (min): 40 min   Pt denies pain Individual Therapy  Pt resting in bed upon arrival listening to music. Pt engaged in bed mobility tasks at mod/max A level.  HOH assistance provided to complete simple grooming tasks.  BUE PROM performed.  Pt noted with continued less tone and shoulder and finger trace movements.  Pt remained in bed with all needs within reach.    [name] [facility] 09/05/2017, 2:47 PM

## 2017-09-05 NOTE — Plan of Care (Addendum)
LTG of shower transfers, eating, bathing, dressing, grooming, and dressing discontinued and goals of sitting and standing balance and toilet transfers downgraded due to limited trunk control and severely impaired shoulder PROM and trace active movement in BUE.  Pt is unable to actively use his arms for support or assist.

## 2017-09-06 ENCOUNTER — Inpatient Hospital Stay (HOSPITAL_COMMUNITY): Payer: Medicare Other | Admitting: Physical Therapy

## 2017-09-06 ENCOUNTER — Inpatient Hospital Stay (HOSPITAL_COMMUNITY): Payer: Medicare Other

## 2017-09-06 LAB — URINE CULTURE: Culture: >=100000 — AB

## 2017-09-06 NOTE — Progress Notes (Signed)
[place] PHYSICAL MEDICINE & REHABILITATION     PROGRESS NOTE    Subjective/Complaints: No new issues overnight.  Still has low-grade temperature.  ROS: pt denies nausea, vomiting, diarrhea, cough, shortness of breath or chest pain   Objective: Vital Signs: Blood pressure 118/62, pulse (!) 58, temperature 99.9 F (37.7 C), temperature source Oral, resp. rate 12, height 5' 9.5" (1.765 m), weight 72.7 kg (160 lb 4.4 oz), SpO2 99 %. No results found. No results for input(s): WBC, HGB, HCT, PLT in the last 72 hours. No results for input(s): NA, K, CL, GLUCOSE, BUN, CREATININE, CALCIUM in the last 72 hours.  Invalid input(s): CO CBG (last 3)  No results for input(s): GLUCAP in the last 72 hours.  Wt Readings from Last 3 Encounters:  08/18/17 72.7 kg (160 lb 4.4 oz)    Physical Exam:  Head: Normocephalic and atraumatic.  Eyes: Right eye exhibits no discharge. Left eye exhibits no discharge. No scleral icterus.  Neck: Normal range of motion. Neck supple. No thyromegaly present.  Oral: edentulous Cardiovascular: brady, no jvd Respiratory: CTA Bilaterally without wheezes or rales. Normal effort   GI: Soft. Bowel sounds are normal. He exhibits no distension.  Musculoskeletal: He exhibits no edema or tenderness.  Neurological: He is alert.  Patient provides his name and age.    Remains severely dysarthric A&Ox3 Right lean Motor: B/l UE 0/5 proximal to distal--trace movement? B/l LE: HF 2 to 2+/5, KE 2+/5, distally 1-2/5.  No change Skin:   Uro: foreskin now reduced area.  Foley in place with cloudy urine Psychiatric: Anxious    Assessment/Plan: 1. 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 Physiatrist is providing close team supervision and 24 hour management of active medical problems listed below. Physiatrist and rehab team continue to  assess barriers to discharge/monitor patient progress toward functional and medical goals.  Function:  Bathing Bathing position   Position: Bed  Bathing parts   Body parts bathed by helper: Right arm, Left arm, Chest, Abdomen, Front perineal area, Buttocks, Right upper leg, Left upper leg, Right lower leg, Left lower leg, Back  Bathing assist Assist Level: 2 helpers      Upper Body Dressing/Undressing Upper body dressing   What is the patient wearing?: Pull over shirt/dress       Pull over shirt/dress - Perfomed by helper: Thread/unthread right sleeve, Thread/unthread left sleeve, Put head through opening, Pull shirt over trunk        Upper body assist Assist Level: (total assist)      Lower Body Dressing/Undressing Lower body dressing   What is the patient wearing?: Underwear, Pants, Non-skid slipper socks, Ted Hose   Underwear - Performed by helper: Thread/unthread left underwear leg, Thread/unthread right underwear leg, Pull underwear up/down   Pants- Performed by helper: Thread/unthread right pants leg, Thread/unthread left pants leg, Pull pants up/down   Non-skid slipper socks- Performed by helper: Don/doff right sock, Don/doff left sock               TED Hose - Performed by helper: Don/doff right TED hose, Don/doff left TED hose  Lower body assist Assist for lower body dressing: (total assist)      Toileting Toileting Toileting activity did not occur: No continent bowel/bladder event   Toileting steps completed by helper: Adjust clothing prior to toileting, Performs perineal hygiene, Adjust clothing after toileting    Toileting  assist Assist level: Two helpers   Transfers Chair/bed transfer   Chair/bed transfer method: Squat pivot Chair/bed transfer assist level: Maximal assist (Pt 25 - 49%/lift and lower) Chair/bed transfer assistive device: Sliding board     Locomotion Ambulation Ambulation activity did not occur: Safety/medical concerns   Max  distance: 15 Assist level: 2 helpers   Oceanographer activity did not occur: Safety/medical concerns Type: Manual(quadriparesis)      Cognition Comprehension Comprehension assist level: Understands basic 90% of the time/cues < 10% of the time  Expression Expression assist level: Expresses basic 90% of the time/requires cueing < 10% of the time.  Social Interaction Social Interaction assist level: Interacts appropriately 75 - 89% of the time - Needs redirection for appropriate language or to initiate interaction.  Problem Solving Problem solving assist level: Solves basic 90% of the time/requires cueing < 10% of the time  Memory Memory assist level: Recognizes or recalls 90% of the time/requires cueing < 10% of the time   Medical Problem List and Plan: 1.  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 SNF pending    - bilateral WHO's and PRAFO's to be worn at rest   -Therapy intensity decreased to 15 hours over 7 days 2.  DVT Prophylaxis/Anticoagulation: Subcutaneous heparin.    3. Pain Management: Hydrocodone as needed, Zanaflex 4 mg every 6 hours as needed   -Continue low-dose baclofen to help with extensor and adductor tone 4. Mood/schizophrenia: Xanax 0.25 mg twice daily as needed, no evidence of agitation,   risperdal .5mg  BID with good results 5. Neuropsych: This patient is capable of making decisions on his own behalf. 6. Skin/Wound Care: Routine skin checks 7. Fluids/Electrolytes/Nutrition: encourage PO    - Potassium normalSodium up to 132 most recently 8. Hypotension with bradycardia with history of HTN. Bradycardia felt to be chronic. Follow-up cardiology services no further planned workup. Echocardiogram unremarkable. Troponin negative. Monitor with increased mobility 9. Hyponatremia. See above 10. Dysphagia. Dysphagia #2 thin liquids.advance per speech therapy  11. UTI/neurogenic bladder:       - UCX + for proteus and enterococcus--augmentin completed   -Foley replaced last night due to obstruction .    -Follow-up urine culture again with Proteus.  Bactrim started yesterday 12/17 12.  Neurogenic bowel: Working on bowel program with daily suppository--     -Scheduled Fleet enema  qam with more regular results   LOS (Days) [place] T, MD 09/06/2017 8:46 AM

## 2017-09-06 NOTE — Progress Notes (Signed)
Physical Therapy Session Note  Patient Details  Name: Dean Oliver MRN: [id number] Date of Birth: 1945-12-08  Today's Date: 09/06/2017 PT Individual Time: 0800-0900 PT Individual Time Calculation (min): 60 min   Short Term Goals: Week 3:  PT Short Term Goal 1 (Week 3): =LTG due to estimated LOS  Skilled Therapeutic Interventions/Progress Updates: Pt received supine in bed, denies pain and agreeable to treatment. BLE TEDs, ace wraps donned totalA. Pants donned maxA with pt bridging to A with pulling pants up. Supine>sit with maxA; pt able to initiate moving LEs towards side of bed. Squat pivot transfer to w/c modA. Shirt donned totalA. Seated in w/c at sink, performed oral hygiene maxA; pt able to demo anterior weight shift to lean over sink and spit. Gait x15' with LUE three muskateers x15' with modA +2; cues for upright posture d/t posterior/L lateral bias. Sit <>supine on mat table maxA. Positioned on towel roll longitudinally between shoulder blades and positioned in T/Y position for pec stretch; pt reports increased stiffnesss and pain R>L. Hookyling trunk rotation x10 each side to A with tone management. Returned to w/c squat pivot modA. Pt engaged in music therapy throughout session to increase compliance and participation. Remained semi-reclined in w/c at end of session, all needs in reach.      Therapy Documentation Precautions:  Precautions Precautions: Fall, Cervical Required Braces or Orthoses: Cervical Brace Cervical Brace: Hard collar, Other (comment)(okay to don in sitting) Restrictions Weight Bearing Restrictions: No  See Function Navigator for Current Functional Status.   Therapy/Group: Individual Therapy  Luberta Mutter 09/06/2017, 9:13 AM

## 2017-09-06 NOTE — Progress Notes (Signed)
Occupational Therapy Session Note  Patient Details  Name: Dean Oliver MRN: [id number] Date of Birth: [birth year]  Today's Date: 09/06/2017 OT Individual Time: 1100-1200 OT Individual Time Calculation (min): 60 min    Short Term Goals: Week 3:  OT Short Term Goal 1 (Week 3): STG=LTG secondary to ELOS  Skilled Therapeutic Interventions/Progress Updates:    Pt resting in w/c upon arrival.  OT intervention with focus on BUE PROM/AAROM/AROM.  Pt with decreased BUE tone this morning.  BUE PROM at shoulder ~110 degrees without pt reporting pain.  Performed BUE slides on table top.  Trace shoulder shrugs and finger flexion.  Pt remained in w/c with music playing and soft call bell positioned.  Therapy Documentation Precautions:  Precautions Precautions: Fall, Cervical Required Braces or Orthoses: Cervical Brace Cervical Brace: Hard collar, Other (comment)(okay to don in sitting) Restrictions Weight Bearing Restrictions: No  Pain: Pain Assessment Pain Assessment: Faces Faces Pain Scale: Hurts a little bit Pain Location: Generalized Pain Descriptors / Indicators: Aching Patients Stated Pain Goal: 2 Pain Intervention(s): Medication (See eMAR);Repositioned  See Function Navigator for Current Functional Status.   Therapy/Group: Individual Therapy  [name] 09/06/2017, 12:19 PM

## 2017-09-06 NOTE — Progress Notes (Signed)
Physical Therapy Session Note  Patient Details  Name: CAIDIN HEIDENREICH MRN: [id number] Date of Birth: 1946-05-16  Today's Date: 09/06/2017 PT Individual Time: 0915-0945 PT Individual Time Calculation (min): 30 min   Short Term Goals: Week 3:  PT Short Term Goal 1 (Week 3): =LTG due to estimated LOS  Skilled Therapeutic Interventions/Progress Updates:    Patient in w/c in room finished breakfast with NT assist.  Patient in w/c assisted to ortho gym.  Transfer to mat mod A +2 for safety and sitting balance activities performed including seated leans and kicking ball with min A overall.  Patient requiring cues to correct when LOB to R.  Transferred back to w/c mod A +1 and pt assisted to room and left with soft touch call button in reach.   Therapy Documentation Precautions:  Precautions Precautions: Fall, Cervical Required Braces or Orthoses: Cervical Brace Cervical Brace: Hard collar, Other (comment)(okay to don in sitting) Restrictions Weight Bearing Restrictions: No Pain: Pain Assessment Pain Assessment: No/denies pain    See Function Navigator for Current Functional Status.   Therapy/Group: Individual Therapy  Reginia Naas 09/06/2017, 2:56 PM

## 2017-09-07 ENCOUNTER — Inpatient Hospital Stay (HOSPITAL_COMMUNITY): Payer: Medicare Other | Admitting: Physical Therapy

## 2017-09-07 ENCOUNTER — Inpatient Hospital Stay (HOSPITAL_COMMUNITY): Payer: Medicare Other | Admitting: *Deleted

## 2017-09-07 MED ORDER — SULFAMETHOXAZOLE-TRIMETHOPRIM 800-160 MG PO TABS: 1.0000 | ORAL_TABLET | Freq: Two times a day (BID) | ORAL | 0 refills | Status: DC

## 2017-09-07 MED ORDER — BACLOFEN 5 MG PO TABS: 5.0000 mg | ORAL_TABLET | Freq: Two times a day (BID) | ORAL | 0 refills | Status: AC

## 2017-09-07 MED ORDER — HYDROCODONE-ACETAMINOPHEN 5-325 MG PO TABS: 1.0000 | ORAL_TABLET | Freq: Four times a day (QID) | ORAL | 0 refills | Status: DC | PRN

## 2017-09-07 MED ORDER — ALPRAZOLAM 0.25 MG PO TABS: 0.2500 mg | ORAL_TABLET | Freq: Two times a day (BID) | ORAL | 0 refills | Status: DC | PRN

## 2017-09-07 MED ORDER — TIZANIDINE HCL 4 MG PO TABS: 4.0000 mg | ORAL_TABLET | Freq: Four times a day (QID) | ORAL | 0 refills | Status: AC | PRN

## 2017-09-07 MED ORDER — SENNOSIDES-DOCUSATE SODIUM 8.6-50 MG PO TABS: 2.0000 | ORAL_TABLET | Freq: Every day | ORAL | Status: DC

## 2017-09-07 MED ORDER — FLEET ENEMA 7-19 GM/118ML RE ENEM: 1.0000 | ENEMA | Freq: Every day | RECTAL | 0 refills | Status: AC | PRN

## 2017-09-07 MED ORDER — RISPERIDONE 0.5 MG PO TABS: 0.5000 mg | ORAL_TABLET | Freq: Two times a day (BID) | ORAL | 0 refills | Status: AC

## 2017-09-07 NOTE — Progress Notes (Signed)
Occupational Therapy Session Note  Patient Details  Name: VIOLET CART MRN: [id number] Date of Birth: 05/07/1946  Today's Date: 09/07/2017 OT Individual Time: 0700-0755 OT Individual Time Calculation (min): 55 min    Short Term Goals: Week 3:  OT Short Term Goal 1 (Week 3): STG=LTG secondary to ELOS  Skilled Therapeutic Interventions/Progress Updates:    OT intervention with focus on bed mobility, sitting balance, following one step commands, functional transfers, and directing care.  Pt requires max A for bed mobility and supine>sit EOB.  Pt sits EOB at supervision level. Pt performs squat pivot transfer to w/c with mod A.  Pt requires tot A for bathing/dressing tasks.  Pt remained in w/c with all needs within reach.   Therapy Documentation Precautions:  Precautions Precautions: Fall, Cervical Required Braces or Orthoses: Cervical Brace Cervical Brace: Hard collar, Other (comment)(okay to don in sitting) Restrictions Weight Bearing Restrictions: No Pain:  "I feel alright"  See Function Navigator for Current Functional Status.   Therapy/Group: Individual Therapy  [name] 09/07/2017, 7:55 AM

## 2017-09-07 NOTE — Progress Notes (Signed)
Patient is going to nursing home via ambulance, just called and gave report.

## 2017-09-07 NOTE — Progress Notes (Addendum)
Physical Therapy Session Note  Patient Details  Name: Dean Oliver MRN: [id number] Date of Birth: [birth year]  Today's Date: 09/07/2017 PT Individual Time: 1035-1055 PT Individual Time Calculation (min): 20 min    Skilled Therapeutic Interventions/Progress Updates:    Session initiated with pt seated up in wheelchair.  Session focused on improving functional mobility to decrease burden of care for D/C today to SNF.  Pt with c-collar in place t/o session.  Pt transferred to bed with max assist x 1, sit to supine with +2 assist, rolls with mod A b/l.  Following session, pt returned to bed, caregiver in room, and call bell in reach.  Pt declined full session today due to wanting to rest prior to moving to SNF. Therapy Documentation Precautions:  Precautions Precautions: Fall, Cervical Required Braces or Orthoses: Cervical Brace Cervical Brace: Hard collar, Other (comment)(okay to don in sitting) Restrictions Weight Bearing Restrictions: No   See Function Navigator for Current Functional Status.   Therapy/Group: Individual Therapy  Emerson Barretto Hilario Quarry 09/07/2017, 12:37 PM

## 2017-09-07 NOTE — Progress Notes (Signed)
Social Work Patient ID: Dean Oliver, male   DOB: [birth year], 71 y.o.   MRN: [id number]    Rexene Alberts  Social Worker  General Practice  Patient Care Conference  Signed  Date of Service:  09/07/2017  9:51 AM          Signed          [] Hide copied text  [] Hover for details   Inpatient RehabilitationTeam Conference and Plan of Care Update Date: 09/06/2017   Time: 2:00 PM      Patient Name: Dean Oliver      Medical Record Number: [id number]  Date of Birth: 1946/06/10 Sex: Male         Room/Bed: 4W08C/4W08C-01 Payor Info: Payor: MEDICARE / Plan: MEDICARE PART B / Product Type: *No Product type* /     Admitting Diagnosis: Hypotension  Admit Date/Time:  08/17/2017  4:17 PM Admission Comments: No comment available    Primary Diagnosis:  Central cord syndrome ([place]) Principal Problem: Central cord syndrome [facility])       Patient Active Problem List    Diagnosis Date Noted  . Neurogenic bladder 08/18/2017  . Neurogenic bowel 08/18/2017  . Bacterial UTI 08/18/2017  . Central cord syndrome ([place]) 08/17/2017  . Dysphagia    . Fall    . Hypotension    . Sinus bradycardia    . HNP (herniated nucleus pulposus) with myelopathy, cervical    . Benign essential HTN    . Hyponatremia    . Acute blood loss anemia    . Symptomatic bradycardia 08/12/2017  . Schizophrenia [facility])        Expected Discharge Date: Expected Discharge Date: (SNF)   Team Members Present: Physician leading conference: Dr. Alger Simons Social Worker Present: Lennart Pall, LCSW Nurse Present: Dorien Chihuahua, RN PT Present: Kem Parkinson, PT OT Present: Roanna Epley, [place], OT SLP Present: Weston Anna, SLP PPS Coordinator present : Daiva Nakayama, RN, CRRN       Current Status/Progress Goal Weekly Team Focus  Medical     Foley had to be changed out last night.  Minimal neurological progress.  Spasticity perhaps a bit better  Stabilized  medically for discharge to next venue  of care  Bladder/UTI/catheter management   Bowel/Bladder     Incont. of bowel; foley catheter. LBM 09/03/17  free from CAUTI and to able urinate freely  Foley care, pari care; empty drainage bag   Swallow/Nutrition/ Hydration               ADL's     sitting balance-min A; functional transfers-mod A/max A; less BUE tone; trace finger flexion and shoulder shrug  bathing/dressing goals d/c; sitting balance-min A; standing balance-mod A; toilet transfers - mod A  activity tolerance, bed mobility, sitting balance, functional transfers   Mobility     maxA bed mobility, mod/maxA squat pivot transfers, maxA +2 gait  modA bed mobility, transfers, modA standing balance  BLE NMR, bed mobility and transfer training, gait training, dynamic sitting/standing balance    Communication               Safety/Cognition/ Behavioral Observations             Pain     no C/O pain  <2  Assess and treat pain q shift and as needed   Skin     Bacitracin to foreskin, sweeling penis  Skin to be free from infection/breakdown  Assess skin q shift and as needed     *  See Care Plan and progress notes for long and short-term goals.      Barriers to Discharge   Current Status/Progress Possible Resolutions Date Resolved   Physician     Neurogenic Bowel & Bladder        Location in facility which can provide needed care      Nursing                 PT                    OT                 SLP            SW              Discharge Planning/Teaching Needs:  Plan for pt to d/c to at SNF when medically cleared to do so.  NA   Team Discussion:  Foley replaced due to UTI.  Still total assist with ADLs and mod/ max with transfers.  Ready for SNF when bed available.  Revisions to Treatment Plan:       Continued Need for Acute Rehabilitation Level of Care: The patient requires daily medical management by a physician with specialized training in physical medicine and rehabilitation for the following  conditions: Daily direction of a multidisciplinary physical rehabilitation program to ensure safe treatment while eliciting the highest outcome that is of practical value to the patient.: Yes Daily medical management of patient stability for increased activity during participation in an intensive rehabilitation regime.: Yes Daily analysis of laboratory values and/or radiology reports with any subsequent need for medication adjustment of medical intervention for : Neurological problems;Urological problems   Dean Oliver 09/07/2017, 9:52 AM                  [name], [place] Worker  General Practice  Patient Care Conference  Signed  Date of Service:  08/30/2017  3:06 PM          Signed          [] Hide copied text  [] Hover for details   Inpatient RehabilitationTeam Conference and Plan of Care Update Date: 08/30/2017   Time: 2:05 PM      Patient Name: Dean Oliver      Medical Record Number: [id number]  Date of Birth: 12-05-1945 Sex: Male         Room/Bed: 4W08C/4W08C-01 Payor Info: Payor: MEDICARE / Plan: MEDICARE PART B / Product Type: *No Product type* /     Admitting Diagnosis: Hypotension  Admit Date/Time:  08/17/2017  4:17 PM Admission Comments: No comment available    Primary Diagnosis:  Central cord syndrome ([place]) Principal Problem: Central cord syndrome [facility])       Patient Active Problem List    Diagnosis Date Noted  . Neurogenic bladder 08/18/2017  . Neurogenic bowel 08/18/2017  . Bacterial UTI 08/18/2017  . Central cord syndrome ([place]) 08/17/2017  . Dysphagia    . Fall    . Hypotension    . Sinus bradycardia    . HNP (herniated nucleus pulposus) with myelopathy, cervical    . Benign essential HTN    . Hyponatremia    . Acute blood loss anemia    . Symptomatic bradycardia 08/12/2017  . Schizophrenia [facility])        Expected Discharge Date: Expected Discharge Date: (SNF)   Team Members Present: Physician leading conference: Dr.  Alger Simons Social Worker  Present: Lennart Pall, LCSW Nurse Present: Junius Creamer, RN PT Present: Kem Parkinson, PT OT Present: Roanna Epley, [place], OT PPS Coordinator present : Daiva Nakayama, RN, CRRN       Current Status/Progress Goal Weekly Team Focus  Medical     Minimal changes in neurological examination.  Foley replaced.  Still incontinent of bowel and working towards bowel program  Establish  See above   Bowel/Bladder     incont of bowel; foley catheter; lbm 08/29/2017  free from CAUTI  foley care and peri care; empty drainage bag   Swallow/Nutrition/ Hydration               ADL's     bed mobility-max A; sitting balance-min A/supervision; self care-tot A; functional transfers-mod/max A  mod A overall      Mobility     totalA bed mobility, mod/maxA squat pivot transfers, maxA +2 gait non-functional distances   modA bed mobility, transfers, modA standing balance  activity tolerance, sitting/standing balance   Communication               Safety/Cognition/ Behavioral Observations   CVA; heparin; maximove  free from injury/fall  follow patient safety plan   Pain     no c/o pain  <2 out of 10  assess for pain q shift and prn   Skin     bacitracin to foreskin; foam to sacrum  skin free from infection and breakdown  assess skin q shift and prn     Rehab Goals Patient on target to meet rehab goals: Yes *See Care Plan and progress notes for long and short-term goals.      Barriers to Discharge   Current Status/Progress Possible Resolutions Date Resolved   Physician     Neurogenic Bowel & Bladder        Foley replaced.  Trying suppository/enema for a.m. program      Nursing                 PT                    OT                 SLP            SW              Discharge Planning/Teaching Needs:  Plan for pt to d/c to at SNF when medically cleared to do so.  NA   Team Discussion:  Medically ready for SNF per MD but will need to continue to  address bowel.  Currently squat -pivot at mod assist.  Improved sitting balance.  SW has begun bed search.  Revisions to Treatment Plan:  None    Continued Need for Acute Rehabilitation Level of Care: The patient requires daily medical management by a physician with specialized training in physical medicine and rehabilitation for the following conditions: Daily direction of a multidisciplinary physical rehabilitation program to ensure safe treatment while eliciting the highest outcome that is of practical value to the patient.: Yes Daily medical management of patient stability for increased activity during participation in an intensive rehabilitation regime.: Yes Daily analysis of laboratory values and/or radiology reports with any subsequent need for medication adjustment of medical intervention for : Neurological problems   Dean Oliver 08/30/2017, 3:06 PM                  [name], [place] Worker  General Practice  Patient Care Conference  Signed  Date of Service:  08/24/2017  3:54 PM          Signed          [] Hide copied text  [] Hover for details   Inpatient RehabilitationTeam Conference and Plan of Care Update Date: 08/23/2017   Time: 2:10 PM      Patient Name: Dean Oliver      Medical Record Number: [id number]  Date of Birth: 04/09/1946 Sex: Male         Room/Bed: 4W08C/4W08C-01 Payor Info: Payor: MEDICARE / Plan: MEDICARE PART B / Product Type: *No Product type* /     Admitting Diagnosis: Hypotension  Admit Date/Time:  08/17/2017  4:17 PM Admission Comments: No comment available    Primary Diagnosis:  Central cord syndrome ([place]) Principal Problem: Central cord syndrome [facility])       Patient Active Problem List    Diagnosis Date Noted  . Neurogenic bladder 08/18/2017  . Neurogenic bowel 08/18/2017  . Bacterial UTI 08/18/2017  . Central cord syndrome ([place]) 08/17/2017  . Dysphagia    . Fall    . Hypotension    . Sinus bradycardia    .  HNP (herniated nucleus pulposus) with myelopathy, cervical    . Benign essential HTN    . Hyponatremia    . Acute blood loss anemia    . Symptomatic bradycardia 08/12/2017  . Schizophrenia [facility])        Expected Discharge Date: Expected Discharge Date: (SNF)   Team Members Present: Physician leading conference: Dr. Alger Simons Social Worker Present: Lennart Pall, LCSW Nurse Present: Isla Pence, RN PT Present: Kem Parkinson, PT OT Present: Willeen Cass, OT;Roanna Epley, COTA SLP Present: Charolett Bumpers, SLP PPS Coordinator present : Ileana Ladd, PT       Current Status/Progress Goal Weekly Team Focus  Medical     Cervical central cord syndrome with tetraplegia, early spasticity.  Patient with neurogenic bowel and bladder as well.  Improve functional use of arms and legs.  Bladder management, treating urinary tract infection, heart rate and blood pressure control, pain management   Bowel/Bladder     requires I & O cath Q 6-8 hours for relief  maintain bladder free from infections      Swallow/Nutrition/ Hydration     dys 3 and thin liquids, Min- Supervision A pocketing and removing food  supervision  skilled observation during meal, carryover strategies for swallowing   ADL's     bed mobility-max A; sitting balance-max A/mod A; tot A for self care  mod A overall  activity tolerance, bed mobility, sitting balance, BADL retraining   Mobility     totalA bed mobility and transfers, +2 sit <>stand, min guard sitting balance  modA bed mobility, transfers, modA standing balance  activity tolerance, participation in therapy, bed mobility, transfers, sitting balance   Communication     clear verbal speech  maintain clear communication      Safety/Cognition/ Behavioral Observations   alert and oriented without noted behaviors  maintain cognition to meet needs      Pain     denies any pain   maintian adequate comfort      Skin     intact  maintain skin free from breakdowns         Rehab Goals Patient on target to meet rehab goals: Yes *See Care Plan and progress notes for long and short-term goals.      Barriers to  Discharge   Current Status/Progress Possible Resolutions Date Resolved   Physician     Medical stability;Neurogenic Bowel & Bladder        Ongoing rehabilitation efforts.  Treating UTI.  May need to go home with a Foley catheter unless a caregiver is able to perform caths      Nursing                 PT                    OT                 SLP            SW              Discharge Planning/Teaching Needs:  Plan for pt to d/c to at SNF when medically cleared to do so.  NA   Team Discussion:  Pt with central cord injury and baseline cognitive deficits; difficult to understand.  Awaiting in put from urology.  Need to address bowel issues and establish a good b/b program.  Total assistance overall;  Max tone.  amb 7' with 2 max assist today.  Trial of tilt-in-space w/c.  Pt is pocketing food but overall with good intake.  Mod assist goals overall.  Revisions to Treatment Plan:       Continued Need for Acute Rehabilitation Level of Care: The patient requires daily medical management by a physician with specialized training in physical medicine and rehabilitation for the following conditions: Daily direction of a multidisciplinary physical rehabilitation program to ensure safe treatment while eliciting the highest outcome that is of practical value to the patient.: Yes Daily medical management of patient stability for increased activity during participation in an intensive rehabilitation regime.: Yes Daily analysis of laboratory values and/or radiology reports with any subsequent need for medication adjustment of medical intervention for : Neurological problems;Urological problems   Dean Oliver 08/24/2017, 3:54 PM

## 2017-09-07 NOTE — Progress Notes (Signed)
Physical Therapy Discharge Summary  Patient Details  Name: Dean Oliver MRN: [id number] Date of Birth: 1945-11-06  Today's Date: 09/07/2017 PT Individual Time: 1035-1055 PT Individual Time Calculation (min): 20 min    Patient has met 2 of 4 long term goals due to improved activity tolerance, improved balance, improved postural control, functional use of  right lower extremity and left lower extremity and improved coordination.  Patient to discharge at a wheelchair level Max Assist.   Patient to d/c to SNF for continued progress towards functional goals and reducing burden of care.  Reasons goals not met: Requires maxA for standing balance, maxA sit>supine.   Recommendation:  Patient will benefit from ongoing skilled PT services in skilled nursing facility setting to continue to advance safe functional mobility, address ongoing impairments in strength, coordination, ROM, activity tolerance, and minimize fall risk.  Equipment: No equipment provided  Reasons for discharge: lack of progress toward goals and discharge from hospital  Patient/family agrees with progress made and goals achieved: Yes  PT Discharge Precautions/Restrictions Precautions Precautions: Fall;Cervical Required Braces or Orthoses: Cervical Brace Cervical Brace: Hard collar Restrictions Weight Bearing Restrictions: No Pain Pain Assessment Faces Pain Scale: No hurt Vision/Perception    No change from baseline; cataracts Cognition Overall Cognitive Status: History of cognitive impairments - at baseline Arousal/Alertness: Awake/alert Orientation Level: Oriented to situation Attention: Sustained Sustained Attention: Appears intact Memory: Impaired Awareness: Impaired Awareness Impairment: Intellectual impairment Problem Solving: Impaired Problem Solving Impairment: Functional basic Safety/Judgment: Impaired Sensation Sensation Light Touch: Impaired Detail Light Touch Impaired Details: Impaired  LUE;Impaired RUE Stereognosis: Not tested Hot/Cold: Appears Intact Proprioception: Impaired Detail Proprioception Impaired Details: Impaired RUE;Impaired LUE Motor  Motor Motor: Tetraplegia  Mobility Bed Mobility Bed Mobility: Rolling Right;Rolling Left;Supine to Sit;Sit to Supine Rolling Right: 3: Mod assist Rolling Right Details: Verbal cues for technique;Tactile cues for weight shifting;Tactile cues for placement Rolling Left: 3: Mod assist Rolling Left Details: Tactile cues for placement;Tactile cues for weight shifting;Verbal cues for precautions/safety;Verbal cues for technique Left Sidelying to Sit: 2: Max assist Left Sidelying to Sit: Patient Percentage: 20% Left Sidelying to Sit Details: Manual facilitation for weight shifting;Tactile cues for weight shifting;Tactile cues for sequencing;Tactile cues for placement;Tactile cues for posture Sit to Supine: 1: +1 Total assist Sit to Supine - Details: Verbal cues for technique;Verbal cues for precautions/safety;Tactile cues for weight shifting Transfers Transfers: Yes Sit to Stand: 3: Mod assist Sit to Stand Details: Tactile cues for sequencing;Tactile cues for weight shifting;Tactile cues for posture;Tactile cues for initiation Squat Pivot Transfers: 3: Mod assist;2: Max assist(fluctuates) Squat Pivot Transfer Details: Verbal cues for technique;Verbal cues for sequencing;Verbal cues for precautions/safety;Tactile cues for weight shifting;Tactile cues for placement;Tactile cues for sequencing;Tactile cues for initiation Locomotion  Ambulation Ambulation: Yes Ambulation/Gait Assistance: 1: +2 Total assist Ambulation Distance (Feet): 30 Feet Assistive device: 2 person hand held assist("three muskateers" under RUE, LUE HHA by second person) Ambulation/Gait Assistance Details: Verbal cues for technique;Verbal cues for precautions/safety;Tactile cues for weight shifting;Tactile cues for weight beaing;Tactile cues for placement;Tactile  cues for sequencing;Tactile cues for posture Ambulation/Gait Assistance Details: assist for LE placement to reduce scissoring, tactile cueing at R knee for stance control, cues for anterior weight shift d/t posterior bias Gait Gait: Yes Gait Pattern: Right foot flat;Left foot flat;Narrow base of support;Poor foot clearance - left;Poor foot clearance - right;Decreased stride length Gait velocity: significantly decreased Stairs / Additional Locomotion Stairs: No Architect: Yes Wheelchair Assistance: 4: Advertising account executive Details: (propels backwards; non-functional) Wheelchair  Propulsion: Both lower extermities Wheelchair Parts Management: Needs assistance Distance: 56'   Trunk/Postural Assessment  Cervical Assessment Cervical Assessment: Exceptions to WFL(Cervical collar) Thoracic Assessment Thoracic Assessment: (Inc kyphosis) Lumbar Assessment Lumbar Assessment: (Post pelvic tilt) Postural Control Postural Control: Deficits on evaluation Righting Reactions: (Requires min A for sitting balance)  Balance Balance Balance Assessed: Yes Static Sitting Balance Static Sitting - Balance Support: No upper extremity supported(min A sitting EOB) Dynamic Sitting Balance Sitting balance - Comments: min a sitting EOB Extremity Assessment  RUE Assessment RUE Assessment: Exceptions to WFL(Absent muscle activation in R UE) RUE Strength RUE Overall Strength Comments: trace shoulder and biceps/triceps; trace finger flecion RUE Tone RUE Tone: Modified Ashworth Modified Ashworth Scale for Grading Hypertonia RUE: Slight increase in muscle tone, manifested by a catch, followed by minimal resistance throughout the remainder (less than half) of the ROM LUE Assessment LUE Assessment: Exceptions to WFL(No UE mm activation noted) LUE Strength LUE Overall Strength Comments: trace movement in shouler elevatoin, biceps, triceps, and finger flexion LUE Tone LUE  Tone: Modified Ashworth Modified Ashworth Scale for Grading Hypertonia LUE: Slight increase in muscle tone, manifested by a catch, followed by minimal resistance throughout the remainder (less than half) of the ROM RLE Assessment RLE Assessment: Exceptions to WFL(Pt able to extend b/l knees against gravity; unable to follow commands for hip flexion or ankle DF) LLE Assessment LLE Assessment: (Able to extend b/l knees against gravity, however, unable to follow commands for hip flexion or ankle DF)   See Function Navigator for Current Functional Status.  LaVerene Hilario Quarry 09/07/2017, 12:49 PM

## 2017-09-07 NOTE — Progress Notes (Signed)
[facility] PHYSICAL MEDICINE & REHABILITATION     PROGRESS NOTE    Subjective/Complaints: No new complaints or problems.  Slept well  Objective: Vital Signs: Blood pressure (!) 150/60, pulse 60, temperature 99.1 F (37.3 C), temperature source Oral, resp. rate 18, height 5' 9.5" (1.765 m), weight 72.7 kg (160 lb 4.4 oz), SpO2 100 %. No results found. No results for input(s): WBC, HGB, HCT, PLT in the last 72 hours. No results for input(s): NA, K, CL, GLUCOSE, BUN, CREATININE, CALCIUM in the last 72 hours.  Invalid input(s): CO CBG (last 3)  No results for input(s): GLUCAP in the last 72 hours.  Wt Readings from Last 3 Encounters:  08/18/17 72.7 kg (160 lb 4.4 oz)    Physical Exam:  Head: Normocephalic and atraumatic.  Eyes: Right eye exhibits no discharge. Left eye exhibits no discharge. No scleral icterus.  Neck: Normal range of motion. Neck supple. No thyromegaly present.  Oral: edentulous Cardiovascular: Bradycardic Respiratory: CTA Bilaterally without wheezes or rales. Normal effort   GI: Soft. Bowel sounds are normal. He exhibits no distension.  Musculoskeletal: He exhibits no edema or tenderness.  Neurological: He is alert.  Patient provides his name and age.    Remains severely dysarthric A&Ox3 Right lean Motor: B/l UE 0/5 proximal to distal--trace movement? B/l LE: HF 2 to 2+/5, KE 2+/5, distally 1-2/5.  No change Skin:   Uro: foreskin now reduced area.  Foley in place with cloudy urine Psychiatric: Anxious    Assessment/Plan: 1. 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 Physiatrist is providing close team supervision and 24 hour management of active medical problems listed below. Physiatrist and rehab team continue to assess barriers to discharge/monitor patient progress toward functional and medical  goals.  Function:  Bathing Bathing position   Position: Bed  Bathing parts   Body parts bathed by helper: Right arm, Left arm, Chest, Abdomen, Front perineal area, Buttocks, Right upper leg, Left upper leg, Right lower leg, Left lower leg, Back  Bathing assist Assist Level: 2 helpers      Upper Body Dressing/Undressing Upper body dressing   What is the patient wearing?: Pull over shirt/dress       Pull over shirt/dress - Perfomed by helper: Thread/unthread right sleeve, Thread/unthread left sleeve, Put head through opening, Pull shirt over trunk        Upper body assist Assist Level: (totalA)      Lower Body Dressing/Undressing Lower body dressing   What is the patient wearing?: Pants, Ted Hose, Non-skid slipper socks   Underwear - Performed by helper: Thread/unthread left underwear leg, Thread/unthread right underwear leg, Pull underwear up/down   Pants- Performed by helper: Thread/unthread right pants leg, Thread/unthread left pants leg, Pull pants up/down   Non-skid slipper socks- Performed by helper: Don/doff right sock, Don/doff left sock               TED Hose - Performed by helper: Don/doff right TED hose, Don/doff left TED hose  Lower body assist Assist for lower body dressing: (maxA)      Toileting Toileting Toileting activity did not occur: No continent bowel/bladder event   Toileting steps completed by helper: Adjust clothing prior to toileting, Performs perineal hygiene, Adjust clothing after toileting    Toileting assist Assist level: Two helpers   Transfers Chair/bed transfer   Chair/bed transfer method: Squat pivot Chair/bed transfer assist level:  Moderate assist (Pt 50 - 74%/lift or lower) Chair/bed transfer assistive device: Sliding board     Locomotion Ambulation Ambulation activity did not occur: Safety/medical concerns   Max distance: 15 Assist level: 2 helpers   Oceanographer activity did not occur: Safety/medical  concerns Type: Manual(quadriparesis)      Cognition Comprehension Comprehension assist level: Understands basic 90% of the time/cues < 10% of the time  Expression Expression assist level: Expresses basic 90% of the time/requires cueing < 10% of the time.  Social Interaction Social Interaction assist level: Interacts appropriately 90% of the time - Needs monitoring or encouragement for participation or interaction.  Problem Solving Problem solving assist level: Solves basic 50 - 74% of the time/requires cueing 25 - 49% of the time  Memory Memory assist level: Recognizes or recalls 75 - 89% of the time/requires cueing 10 - 24% of the time   Medical Problem List and Plan: 1.  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  DVT Prophylaxis/Anticoagulation: Subcutaneous heparin.    3. Pain Management: Hydrocodone as needed, Zanaflex 4 mg every 6 hours as needed   -Continue low-dose baclofen to help with extensor and adductor tone 4. Mood/schizophrenia: Xanax 0.25 mg twice daily as needed, no evidence of agitation,   risperdal .5mg  BID with good results 5. Neuropsych: This patient is capable of making decisions on his own behalf. 6. Skin/Wound Care: Routine skin checks 7. Fluids/Electrolytes/Nutrition: encourage PO    - Potassium normalSodium up to 132 most recently 8. Hypotension with bradycardia with history of HTN. Bradycardia felt to be chronic. Follow-up cardiology services no further planned workup. Echocardiogram unremarkable. Troponin negative. Monitor with increased mobility 9. Hyponatremia. See above 10. Dysphagia. Dysphagia #2 thin liquids.advance per speech therapy  11. UTI/neurogenic bladder:      - UCX + for proteus and enterococcus--augmentin completed   -Foley replaced last night due to  obstruction .    -Follow-up urine culture again with Proteus.  Bactrim started yesterday 12/17 12.  Neurogenic bowel: Working on bowel program with daily suppository--     -Scheduled Fleet enema  qam with more regular results   LOS (Days) Wittmann T, MD 09/07/2017 9:16 AM

## 2017-09-07 NOTE — Patient Care Conference (Signed)
Inpatient RehabilitationTeam Conference and Plan of Care Update Date: 09/06/2017   Time: 2:00 PM    Patient Name: Dean Oliver      Medical Record Number: [id number]  Date of Birth: 1946/01/24 Sex: Male         Room/Bed: 4W08C/4W08C-01 Payor Info: Payor: MEDICARE / Plan: MEDICARE PART B / Product Type: *No Product type* /    Admitting Diagnosis: Hypotension  Admit Date/Time:  08/17/2017  4:17 PM Admission Comments: No comment available   Primary Diagnosis:  Central cord syndrome (Dean Oliver) Principal Problem: Central cord syndrome [facility])  Patient Active Problem List   Diagnosis Date Noted  . Neurogenic bladder 08/18/2017  . Neurogenic bowel 08/18/2017  . Bacterial UTI 08/18/2017  . Central cord syndrome (Dean Oliver) 08/17/2017  . Dysphagia   . Fall   . Hypotension   . Sinus bradycardia   . HNP (herniated nucleus pulposus) with myelopathy, cervical   . Benign essential HTN   . Hyponatremia   . Acute blood loss anemia   . Symptomatic bradycardia 08/12/2017  . Schizophrenia Dean Oliver - Dean Oliver)     Expected Discharge Date: Expected Discharge Date: (SNF)  Team Members Present: Physician leading conference: Dr. Alger Simons Social Worker Present: Lennart Pall, LCSW Nurse Present: Dorien Chihuahua, RN PT Present: Kem Parkinson, PT OT Present: Roanna Epley, [place], OT SLP Present: Weston Anna, SLP PPS Coordinator present : Daiva Nakayama, RN, CRRN     Current Status/Progress Goal Weekly Team Focus  Medical   Foley had to be changed out last night.  Minimal neurological progress.  Spasticity perhaps a bit better  Stabilized  medically for discharge to next venue of care  Bladder/UTI/catheter management   Bowel/Bladder   Incont. of bowel; foley catheter. LBM 09/03/17  free from CAUTI and to able urinate freely  Foley care, pari care; empty drainage bag   Swallow/Nutrition/ Hydration             ADL's   sitting balance-min A; functional transfers-mod A/max A; less BUE tone; trace  finger flexion and shoulder shrug  bathing/dressing goals d/c; sitting balance-min A; standing balance-mod A; toilet transfers - mod A  activity tolerance, bed mobility, sitting balance, functional transfers   Mobility   maxA bed mobility, mod/maxA squat pivot transfers, maxA +2 gait  modA bed mobility, transfers, modA standing balance  BLE NMR, bed mobility and transfer training, gait training, dynamic sitting/standing balance    Communication             Safety/Cognition/ Behavioral Observations            Pain   no C/O pain  <2  Assess and treat pain q shift and as needed   Skin   Bacitracin to foreskin, sweeling penis  Skin to be free from infection/breakdown  Assess skin q shift and as needed      *See Care Plan and progress notes for long and short-term goals.     Barriers to Discharge  Current Status/Progress Possible Resolutions Date Resolved   Physician    Neurogenic Bowel & Bladder        Location in facility which can provide needed care      Nursing                  PT                    OT  SLP                SW                Discharge Planning/Teaching Needs:  Plan for pt to d/c to at SNF when medically cleared to do so.  NA   Team Discussion:  Foley replaced due to UTI.  Still total assist with ADLs and mod/ max with transfers.  Ready for SNF when bed available.  Revisions to Treatment Plan:      Continued Need for Acute Rehabilitation Level of Care: The patient requires daily medical management by a physician with specialized training in physical medicine and rehabilitation for the following conditions: Daily direction of a multidisciplinary physical rehabilitation program to ensure safe treatment while eliciting the highest outcome that is of practical value to the patient.: Yes Daily medical management of patient stability for increased activity during participation in an intensive rehabilitation regime.: Yes Daily analysis of  laboratory values and/or radiology reports with any subsequent need for medication adjustment of medical intervention for : Neurological problems;Urological problems  Dean Oliver 09/07/2017, 9:52 AM

## 2017-09-07 NOTE — Progress Notes (Signed)
Social Work  Discharge Note  The overall goal for the admission was met for:   Discharge location: Yes - plan upon CIR admission was for SNF  Length of Stay: Yes - 21 dats  Discharge activity level: Yes - max to total assist  Home/community participation: Yes  Services provided included: MD, RD, PT, OT, SLP, RN, TR, Pharmacy and SW  Financial Services: Medicare and Medicaid  Follow-up services arranged: Other: SNF at [place]  Comments (or additional information):  Patient/Family verbalized understanding of follow-up arrangements: Yes  Individual responsible for coordination of the follow-up plan: guardian, [name]  Confirmed correct DME delivered: NA  Aliscia Clayton

## 2017-09-07 NOTE — Progress Notes (Signed)
Occupational Therapy Discharge Summary  Patient Details  Name: Dean Oliver MRN: [id number] Date of Birth: 09-12-1946  Patient has met 2 of 3 long term goals due to improved activity tolerance, improved balance, postural control, ability to compensate for deficits, functional use of  RIGHT lower and LEFT lower extremity and improved attention.  Pt progress was minimal during this admission.  Pt BUE with trace shoulder elevation, biceps, triceps, and finger flexion, but pt is unable to use BUE functionally to assist with BADLs.  Pt requires max A for bed mobility and supine>sit EOB.  Pt able to sit EOB at supervision level but requires min A for dynamic sitting.  Pt performs functional squat pivot transfers with mod A.  Pt discharging to SNF.Patient to discharge at overall Total Assist level for self care and mod A for sq pivot transfers.  Patient's care partner unavailable to provide the necessary physical and cognitive assistance at discharge.    Reasons goals not met: standing balance - pt continues to need max A   Recommendation:  Patient will benefit from ongoing skilled OT services in skilled nursing facility setting to continue to advance functional skills in the area of BADL.  Equipment: No equipment provided discharge to SNF  Reasons for discharge: treatment goals met  Patient/family agrees with progress made and goals achieved: Yes  OT Discharge Vision Baseline Vision/History: Cataracts Patient Visual Report: No change from baseline Cognition Overall Cognitive Status: History of cognitive impairments - at baseline Arousal/Alertness: Awake/alert Orientation Level: Oriented to person;Oriented to place;Oriented to situation Attention: Sustained Sustained Attention: Appears intact Memory: Impaired Awareness: Impaired Awareness Impairment: Intellectual impairment Problem Solving: Impaired Problem Solving Impairment: Functional basic Safety/Judgment:  Impaired Sensation Sensation Light Touch: Impaired Detail Light Touch Impaired Details: Impaired LUE;Impaired RUE Stereognosis: Not tested Hot/Cold: Appears Intact Proprioception: Impaired Detail Proprioception Impaired Details: Impaired RUE;Impaired LUE Motor  Motor Motor: Tetraplegia;Abnormal tone;Abnormal postural alignment and control     Trunk/Postural Assessment  Cervical Assessment Cervical Assessment: Exceptions to WFL(cervical collar) Thoracic Assessment Thoracic Assessment: Exceptions to WFL(foreard flexion) Lumbar Assessment Lumbar Assessment: Exceptions to WFL(posteriot pelvic tilt) Postural Control Postural Control: Deficits on evaluation  Balance Dynamic Sitting Balance Sitting balance - Comments: min a sitting EOB Extremity/Trunk Assessment RUE Assessment RUE Assessment: Exceptions to [employer] RUE Strength RUE Overall Strength Comments: trace shoulder and biceps/triceps; trace finger flecion RUE Tone RUE Tone: Modified Ashworth Modified Ashworth Scale for Grading Hypertonia RUE: Slight increase in muscle tone, manifested by a catch, followed by minimal resistance throughout the remainder (less than half) of the ROM LUE Assessment LUE Assessment: Exceptions to [facility] LUE Strength LUE Overall Strength Comments: trace movement in shouler elevatoin, biceps, triceps, and finger flexion LUE Tone LUE Tone: Modified Ashworth Modified Ashworth Scale for Grading Hypertonia LUE: Slight increase in muscle tone, manifested by a catch, followed by minimal resistance throughout the remainder (less than half) of the ROM   See Function Navigator for Current Functional Status.  [name] [facility] 09/07/2017, 9:51 AM

## 2017-09-07 NOTE — Discharge Instructions (Signed)
Inpatient Rehab Discharge Instructions  BU[facility] AXEL Discharge date and time: No discharge date for patient encounter.   Activities/Precautions/ Functional Status: Activity: activity as tolerated Diet: soft Wound Care: none needed Functional status:  ___ No restrictions     ___ Walk up steps independently ___ 24/7 supervision/assistance   ___ Walk up steps with assistance ___ Intermittent supervision/assistance  ___ Bathe/dress independently ___ Walk with walker     _x__ Bathe/dress with assistance ___ Walk Independently    ___ Shower independently ___ Walk with assistance    ___ Shower with assistance ___ No alcohol     ___ Return to work/school ________  Special Instructions:    My questions have been answered and I understand these instructions. I will adhere to these goals and the provided educational materials after my discharge from the hospital.  Patient/Caregiver Signature _______________________________ Date __________  Clinician Signature _______________________________________ Date __________  Please bring this form and your medication list with you to all your follow-up doctor's appointments.

## 2017-09-07 NOTE — Discharge Summary (Signed)
NAMEJOURDAN, DURBIN NO.:  [id number]  MEDICAL RECORD NO.:  [id number]  LOCATION:  4W08C                        FACILITY:  [facility]  PHYSICIAN:  Meredith Staggers, M.D.DATE OF BIRTH:  [birth year]  DATE OF ADMISSION:  08/17/2017 DATE OF DISCHARGE:  09/07/2017                              DISCHARGE SUMMARY   DISCHARGE DIAGNOSES: 1. 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 2. Subcutaneous heparin for deep vein thrombosis prophylaxis. 3. Pain management. 4. Hypotension with bradycardia. 5. Hyponatremia, resolved. 6. Dysphagia. 7. Urinary tract infection with neurogenic bladder. 8. Neurogenic bowel.  HISTORY OF PRESENT ILLNESS:  This is a 71 year old right-handed male with history of hypertension, documented schizophrenia, resides in a group home, presented on August 12, 2017, after a fall.  Complains of weakness in arms and legs.  CT of the head showed artifact versus subcentimeter infarct in the left pons.  MRI cervical spine showed spinal cord edema at C2-C3 as well as moderate spinal stenosis, central cord syndrome.  Limited MRI of the brain reviewed, relatively unremarkable.  Neurosurgery consulted; not a surgical candidate; maintained in an Aspen cervical collar when out of bed.  Bouts of hypotension, bradycardia; followup with Cardiology Services.  Troponin negative.  Echocardiogram; ejection fraction of 60%, no wall motion abnormalities.  Bradycardia felt to be chronic.  No AV block noted. Cardiology Services signed off on August 15, 2017, no further recommendations.  Subcutaneous heparin for DVT prophylaxis.  Currently on a dysphagia 2 thin liquid diet.  The patient was admitted for a comprehensive rehab program.  PAST MEDICAL HISTORY:  See discharge diagnoses.  SOCIAL HISTORY:  Lives in a group home.  FUNCTIONAL STATUS UPON ADMISSION TO REHAB SERVICES:  +2 physical  assist sit to stand, +2 physical assist supine to sit, max total assist activities of daily living.  PHYSICAL EXAMINATION:  VITAL SIGNS:  Blood pressure 100/54, pulse 110, temperature 100, respirations 18. GENERAL:  Alert male, in no acute distress.  Provides his name and age. Limited medical historian. HEENT:  EOMs intact. NECK:  Aspen cervical collar in place. CARDIAC:  Rate controlled. ABDOMEN:  Soft, nontender.  Good bowel sounds. LUNGS:  Clear to auscultation without wheeze. EXTREMITIES:  Bilateral upper extremity motor testing 0/5 proximal to distal.  Bilateral lower extremities hip flexors 3-/5, knee extension 2+/5, distally 0/5.  REHABILITATION HOSPITAL COURSE:  The patient was admitted to Inpatient Rehab Services.  Therapies initiated on a 3-hour daily basis consisting of physical therapy, occupational therapy, speech therapy, and rehabilitation nursing.  The following issues were addressed during the patient's rehabilitation stay:  Pertaining to Mr. Milanese's central cord injury-myelopathy, he would follow up with Neurosurgery, conservative care, Aspen cervical collar in place.  Subcutaneous heparin throughout his hospital course for DVT prophylaxis, no bleeding episodes.  Pain management with the use of baclofen 5 mg twice daily as well as hydrocodone and Zanaflex 4 mg every 6 hours as needed.  He would continue low-dose baclofen to help with extensor and adductor tone. Mood, schizophrenia; Xanax and Risperdal ongoing, he was cooperative with therapies.  Blood pressures monitored; early hospital course of bradycardia, felt  to be chronic per Cardiology Services, no further recommendations made.  His diet was advanced to mechanical soft, thin liquids.  Neurogenic bowel and bladder.  He was completing a course of Bactrim for a Proteus UTI.  He was afebrile.  Neurogenic bowel with stool softener as advised.  In regard to his neurogenic bladder, Foley catheter tube had been  placed, followed at a distance by Urology Services.  Initially requiring intermittent catheterizations; however, this was difficult to perform, thus a Foley catheter tube was placed and would remain in place, changed regularly.  The patient received weekly collaborative interdisciplinary team conferences to discuss estimated length of stay, family teaching, any barriers to discharge.  He transferred to the moderate assist +2 for safety and sitting balance activities; requires cues to correct with loss of balance to the right; transferred back to wheelchair, moderate assist.  Activities of daily living and homemaking; engaged in bed mobility task at moderate max assist level.  Due to limited assistance at group home, it was felt skilled nursing facility was needed with bed becoming available on September 07, 2017.  DISCHARGE MEDICATIONS:  Included, 1. Aspirin 81 mg p.o. daily. 2. Baclofen 5 mg p.o. b.i.d. 3. Risperdal 0.5 mg p.o. b.i.d. 4. Senokot-S 2 tablets p.o. at bedtime. 5. Bactrim DS 1 tablet p.o. every 12 hours x5 days and stop. 6. Xanax 0.25 mg p.o. b.i.d. 7. Hydrocodone 1-2 tablet p.o. every 6 hours as needed pain. 8. Zanaflex 4 mg p.o. every 6 hours as needed. 9. Fleet Enema daily as needed. 10.MVI po daily    DIET:  Mechanical soft.  SPECIAL INSTRUCTIONS:  Aspen cervical collar when out of bed or sitting up.  The patient would follow up with Dr. Alger Simons at the Outpatient Rehab Service office as directed and Dr. Sherley Bounds, Neurosurgery, call for appointment.     Lauraine Rinne, P.A.   ______________________________ Meredith Staggers, M.D.    DA/MEDQ  D:  09/07/2017  T:  09/07/2017  Job:  [id number]  cc:   [place] and Eustace Moore, MD

## 2017-09-07 NOTE — Progress Notes (Signed)
Social Work Patient ID: Dean Oliver, male   DOB: [birth year], 71 y.o.   MRN: [id number]   Have received SNF bed offer today from [facility].  Pt and family have accepted and plan transfer this afternoon.  Tx team and MD aware.  Jaree Trinka, LCSW

## 2017-09-08 ENCOUNTER — Inpatient Hospital Stay (HOSPITAL_COMMUNITY): Payer: Medicare Other

## 2017-09-08 DIAGNOSIS — R41841 Cognitive communication deficit: Secondary | ICD-10-CM | POA: Diagnosis not present

## 2017-09-08 DIAGNOSIS — M6281 Muscle weakness (generalized): Secondary | ICD-10-CM | POA: Diagnosis not present

## 2017-09-08 DIAGNOSIS — R1311 Dysphagia, oral phase: Secondary | ICD-10-CM | POA: Diagnosis not present

## 2017-09-08 DIAGNOSIS — S14122D Central cord syndrome at C2 level of cervical spinal cord, subsequent encounter: Secondary | ICD-10-CM | POA: Diagnosis not present

## 2017-09-08 DIAGNOSIS — G8250 Quadriplegia, unspecified: Secondary | ICD-10-CM | POA: Diagnosis not present

## 2017-09-09 DIAGNOSIS — S14122D Central cord syndrome at C2 level of cervical spinal cord, subsequent encounter: Secondary | ICD-10-CM | POA: Diagnosis not present

## 2017-09-09 DIAGNOSIS — R1311 Dysphagia, oral phase: Secondary | ICD-10-CM | POA: Diagnosis not present

## 2017-09-09 DIAGNOSIS — M6281 Muscle weakness (generalized): Secondary | ICD-10-CM | POA: Diagnosis not present

## 2017-09-09 DIAGNOSIS — G8250 Quadriplegia, unspecified: Secondary | ICD-10-CM | POA: Diagnosis not present

## 2017-09-09 DIAGNOSIS — R41841 Cognitive communication deficit: Secondary | ICD-10-CM | POA: Diagnosis not present

## 2017-09-10 DIAGNOSIS — M6281 Muscle weakness (generalized): Secondary | ICD-10-CM | POA: Diagnosis not present

## 2017-09-10 DIAGNOSIS — R1311 Dysphagia, oral phase: Secondary | ICD-10-CM | POA: Diagnosis not present

## 2017-09-10 DIAGNOSIS — S14122D Central cord syndrome at C2 level of cervical spinal cord, subsequent encounter: Secondary | ICD-10-CM | POA: Diagnosis not present

## 2017-09-10 DIAGNOSIS — R41841 Cognitive communication deficit: Secondary | ICD-10-CM | POA: Diagnosis not present

## 2017-09-10 DIAGNOSIS — G8250 Quadriplegia, unspecified: Secondary | ICD-10-CM | POA: Diagnosis not present

## 2017-09-11 DIAGNOSIS — G8250 Quadriplegia, unspecified: Secondary | ICD-10-CM | POA: Diagnosis not present

## 2017-09-11 DIAGNOSIS — R41841 Cognitive communication deficit: Secondary | ICD-10-CM | POA: Diagnosis not present

## 2017-09-11 DIAGNOSIS — R1311 Dysphagia, oral phase: Secondary | ICD-10-CM | POA: Diagnosis not present

## 2017-09-11 DIAGNOSIS — S14122D Central cord syndrome at C2 level of cervical spinal cord, subsequent encounter: Secondary | ICD-10-CM | POA: Diagnosis not present

## 2017-09-11 DIAGNOSIS — M6281 Muscle weakness (generalized): Secondary | ICD-10-CM | POA: Diagnosis not present

## 2017-09-12 DIAGNOSIS — R41841 Cognitive communication deficit: Secondary | ICD-10-CM | POA: Diagnosis not present

## 2017-09-12 DIAGNOSIS — S14122D Central cord syndrome at C2 level of cervical spinal cord, subsequent encounter: Secondary | ICD-10-CM | POA: Diagnosis not present

## 2017-09-12 DIAGNOSIS — M6281 Muscle weakness (generalized): Secondary | ICD-10-CM | POA: Diagnosis not present

## 2017-09-12 DIAGNOSIS — R1311 Dysphagia, oral phase: Secondary | ICD-10-CM | POA: Diagnosis not present

## 2017-09-12 DIAGNOSIS — G8250 Quadriplegia, unspecified: Secondary | ICD-10-CM | POA: Diagnosis not present

## 2017-09-14 DIAGNOSIS — M6281 Muscle weakness (generalized): Secondary | ICD-10-CM | POA: Diagnosis not present

## 2017-09-14 DIAGNOSIS — R41841 Cognitive communication deficit: Secondary | ICD-10-CM | POA: Diagnosis not present

## 2017-09-14 DIAGNOSIS — R1311 Dysphagia, oral phase: Secondary | ICD-10-CM | POA: Diagnosis not present

## 2017-09-14 DIAGNOSIS — G8250 Quadriplegia, unspecified: Secondary | ICD-10-CM | POA: Diagnosis not present

## 2017-09-14 DIAGNOSIS — S14122D Central cord syndrome at C2 level of cervical spinal cord, subsequent encounter: Secondary | ICD-10-CM | POA: Diagnosis not present

## 2017-09-14 DIAGNOSIS — R471 Dysarthria and anarthria: Secondary | ICD-10-CM | POA: Diagnosis not present

## 2017-09-14 NOTE — Progress Notes (Addendum)
Late entry for missed G-code:    04-Sep-2017 1600  OT G-codes **NOT FOR INPATIENT CLASS**  Functional Assessment Tool Used Clinical judgement  Functional Limitation Self care  Self Care Current Status [phone]) CN  Self Care Goal Status (X4128) CL   Norman Herrlich, MS OTR/L  Pager: [phone]

## 2017-09-14 NOTE — Progress Notes (Addendum)
Late entry for missed G-code:    14-Sep-2017 1200  OT G-codes **NOT FOR INPATIENT CLASS**  Functional Assessment Tool Used Clinical judgement  Functional Limitation Self care  Self Care Current Status [phone]) CN  Self Care Goal Status (D2550) CL   Norman Herrlich, MS OTR/L  Pager: [phone]

## 2017-09-15 DIAGNOSIS — R7989 Other specified abnormal findings of blood chemistry: Secondary | ICD-10-CM | POA: Diagnosis not present

## 2017-09-15 DIAGNOSIS — S14122D Central cord syndrome at C2 level of cervical spinal cord, subsequent encounter: Secondary | ICD-10-CM | POA: Diagnosis not present

## 2017-09-15 DIAGNOSIS — G8250 Quadriplegia, unspecified: Secondary | ICD-10-CM | POA: Diagnosis not present

## 2017-09-15 DIAGNOSIS — D649 Anemia, unspecified: Secondary | ICD-10-CM | POA: Diagnosis not present

## 2017-09-15 DIAGNOSIS — M6281 Muscle weakness (generalized): Secondary | ICD-10-CM | POA: Diagnosis not present

## 2017-09-15 DIAGNOSIS — R41841 Cognitive communication deficit: Secondary | ICD-10-CM | POA: Diagnosis not present

## 2017-09-15 DIAGNOSIS — R1311 Dysphagia, oral phase: Secondary | ICD-10-CM | POA: Diagnosis not present

## 2017-09-15 DIAGNOSIS — Z5181 Encounter for therapeutic drug level monitoring: Secondary | ICD-10-CM | POA: Diagnosis not present

## 2017-09-16 DIAGNOSIS — G8250 Quadriplegia, unspecified: Secondary | ICD-10-CM | POA: Diagnosis not present

## 2017-09-16 DIAGNOSIS — M6281 Muscle weakness (generalized): Secondary | ICD-10-CM | POA: Diagnosis not present

## 2017-09-16 DIAGNOSIS — S14122D Central cord syndrome at C2 level of cervical spinal cord, subsequent encounter: Secondary | ICD-10-CM | POA: Diagnosis not present

## 2017-09-16 DIAGNOSIS — R1311 Dysphagia, oral phase: Secondary | ICD-10-CM | POA: Diagnosis not present

## 2017-09-16 DIAGNOSIS — R41841 Cognitive communication deficit: Secondary | ICD-10-CM | POA: Diagnosis not present

## 2017-09-17 DIAGNOSIS — S14122D Central cord syndrome at C2 level of cervical spinal cord, subsequent encounter: Secondary | ICD-10-CM | POA: Diagnosis not present

## 2017-09-17 DIAGNOSIS — R41841 Cognitive communication deficit: Secondary | ICD-10-CM | POA: Diagnosis not present

## 2017-09-17 DIAGNOSIS — G8250 Quadriplegia, unspecified: Secondary | ICD-10-CM | POA: Diagnosis not present

## 2017-09-17 DIAGNOSIS — M6281 Muscle weakness (generalized): Secondary | ICD-10-CM | POA: Diagnosis not present

## 2017-09-17 DIAGNOSIS — R1311 Dysphagia, oral phase: Secondary | ICD-10-CM | POA: Diagnosis not present

## 2017-09-19 DIAGNOSIS — R41841 Cognitive communication deficit: Secondary | ICD-10-CM | POA: Diagnosis not present

## 2017-09-19 DIAGNOSIS — S14122D Central cord syndrome at C2 level of cervical spinal cord, subsequent encounter: Secondary | ICD-10-CM | POA: Diagnosis not present

## 2017-09-19 DIAGNOSIS — R1311 Dysphagia, oral phase: Secondary | ICD-10-CM | POA: Diagnosis not present

## 2017-09-19 DIAGNOSIS — G8250 Quadriplegia, unspecified: Secondary | ICD-10-CM | POA: Diagnosis not present

## 2017-09-19 DIAGNOSIS — M6281 Muscle weakness (generalized): Secondary | ICD-10-CM | POA: Diagnosis not present

## 2017-09-19 NOTE — Progress Notes (Signed)
PT Evaluation Addendum Late entry for missed G-code on Sep 02, 2017 Based on review of documentation and goals    09-02-17 1556  PT G-Codes **NOT FOR INPATIENT CLASS**  Functional Assessment Tool Used AM-PAC 6 Clicks Basic Mobility  Functional Limitation Mobility: Walking and moving around  Mobility: Walking and Moving Around Current Status (I3382) CN  Mobility: Walking and Moving Around Goal Status [phone]) CJ  09/19/2017 [place], [place]

## 2017-09-20 DIAGNOSIS — R1311 Dysphagia, oral phase: Secondary | ICD-10-CM | POA: Diagnosis not present

## 2017-09-20 DIAGNOSIS — Z5181 Encounter for therapeutic drug level monitoring: Secondary | ICD-10-CM | POA: Diagnosis not present

## 2017-09-20 DIAGNOSIS — D519 Vitamin B12 deficiency anemia, unspecified: Secondary | ICD-10-CM | POA: Diagnosis not present

## 2017-09-20 DIAGNOSIS — R41841 Cognitive communication deficit: Secondary | ICD-10-CM | POA: Diagnosis not present

## 2017-09-20 DIAGNOSIS — R1312 Dysphagia, oropharyngeal phase: Secondary | ICD-10-CM | POA: Diagnosis not present

## 2017-09-20 DIAGNOSIS — G8250 Quadriplegia, unspecified: Secondary | ICD-10-CM | POA: Diagnosis not present

## 2017-09-20 DIAGNOSIS — J188 Other pneumonia, unspecified organism: Secondary | ICD-10-CM | POA: Diagnosis not present

## 2017-09-20 DIAGNOSIS — D649 Anemia, unspecified: Secondary | ICD-10-CM | POA: Diagnosis not present

## 2017-09-20 DIAGNOSIS — S14122D Central cord syndrome at C2 level of cervical spinal cord, subsequent encounter: Secondary | ICD-10-CM | POA: Diagnosis not present

## 2017-09-20 DIAGNOSIS — M6281 Muscle weakness (generalized): Secondary | ICD-10-CM | POA: Diagnosis not present

## 2017-09-20 DIAGNOSIS — R7989 Other specified abnormal findings of blood chemistry: Secondary | ICD-10-CM | POA: Diagnosis not present

## 2017-09-21 DIAGNOSIS — R41841 Cognitive communication deficit: Secondary | ICD-10-CM | POA: Diagnosis not present

## 2017-09-21 DIAGNOSIS — R471 Dysarthria and anarthria: Secondary | ICD-10-CM | POA: Diagnosis not present

## 2017-09-21 DIAGNOSIS — G8250 Quadriplegia, unspecified: Secondary | ICD-10-CM | POA: Diagnosis not present

## 2017-09-21 DIAGNOSIS — J188 Other pneumonia, unspecified organism: Secondary | ICD-10-CM | POA: Diagnosis not present

## 2017-09-21 DIAGNOSIS — R1311 Dysphagia, oral phase: Secondary | ICD-10-CM | POA: Diagnosis not present

## 2017-09-21 DIAGNOSIS — M6281 Muscle weakness (generalized): Secondary | ICD-10-CM | POA: Diagnosis not present

## 2017-09-21 DIAGNOSIS — S14122D Central cord syndrome at C2 level of cervical spinal cord, subsequent encounter: Secondary | ICD-10-CM | POA: Diagnosis not present

## 2017-09-22 DIAGNOSIS — D649 Anemia, unspecified: Secondary | ICD-10-CM | POA: Diagnosis not present

## 2017-09-22 DIAGNOSIS — R1311 Dysphagia, oral phase: Secondary | ICD-10-CM | POA: Diagnosis not present

## 2017-09-22 DIAGNOSIS — S14122D Central cord syndrome at C2 level of cervical spinal cord, subsequent encounter: Secondary | ICD-10-CM | POA: Diagnosis not present

## 2017-09-22 DIAGNOSIS — M6281 Muscle weakness (generalized): Secondary | ICD-10-CM | POA: Diagnosis not present

## 2017-09-22 DIAGNOSIS — G8250 Quadriplegia, unspecified: Secondary | ICD-10-CM | POA: Diagnosis not present

## 2017-09-22 DIAGNOSIS — Z5181 Encounter for therapeutic drug level monitoring: Secondary | ICD-10-CM | POA: Diagnosis not present

## 2017-09-22 DIAGNOSIS — R41841 Cognitive communication deficit: Secondary | ICD-10-CM | POA: Diagnosis not present

## 2017-09-22 DIAGNOSIS — Z7901 Long term (current) use of anticoagulants: Secondary | ICD-10-CM | POA: Diagnosis not present

## 2017-09-22 DIAGNOSIS — R319 Hematuria, unspecified: Secondary | ICD-10-CM | POA: Diagnosis not present

## 2017-09-22 DIAGNOSIS — Z79899 Other long term (current) drug therapy: Secondary | ICD-10-CM | POA: Diagnosis not present

## 2017-09-22 DIAGNOSIS — R471 Dysarthria and anarthria: Secondary | ICD-10-CM | POA: Diagnosis not present

## 2017-09-22 DIAGNOSIS — J188 Other pneumonia, unspecified organism: Secondary | ICD-10-CM | POA: Diagnosis not present

## 2017-09-22 DIAGNOSIS — N390 Urinary tract infection, site not specified: Secondary | ICD-10-CM | POA: Diagnosis not present

## 2017-09-23 DIAGNOSIS — M6281 Muscle weakness (generalized): Secondary | ICD-10-CM | POA: Diagnosis not present

## 2017-09-23 DIAGNOSIS — J188 Other pneumonia, unspecified organism: Secondary | ICD-10-CM | POA: Diagnosis not present

## 2017-09-23 DIAGNOSIS — S14122D Central cord syndrome at C2 level of cervical spinal cord, subsequent encounter: Secondary | ICD-10-CM | POA: Diagnosis not present

## 2017-09-23 DIAGNOSIS — G8250 Quadriplegia, unspecified: Secondary | ICD-10-CM | POA: Diagnosis not present

## 2017-09-23 DIAGNOSIS — R1311 Dysphagia, oral phase: Secondary | ICD-10-CM | POA: Diagnosis not present

## 2017-09-23 DIAGNOSIS — R41841 Cognitive communication deficit: Secondary | ICD-10-CM | POA: Diagnosis not present

## 2017-09-26 DIAGNOSIS — N319 Neuromuscular dysfunction of bladder, unspecified: Secondary | ICD-10-CM | POA: Diagnosis not present

## 2017-09-26 DIAGNOSIS — G8250 Quadriplegia, unspecified: Secondary | ICD-10-CM | POA: Diagnosis not present

## 2017-09-26 DIAGNOSIS — S14122D Central cord syndrome at C2 level of cervical spinal cord, subsequent encounter: Secondary | ICD-10-CM | POA: Diagnosis not present

## 2017-09-26 DIAGNOSIS — M25472 Effusion, left ankle: Secondary | ICD-10-CM | POA: Diagnosis not present

## 2017-09-26 DIAGNOSIS — R41841 Cognitive communication deficit: Secondary | ICD-10-CM | POA: Diagnosis not present

## 2017-09-26 DIAGNOSIS — Z960 Presence of urogenital implants: Secondary | ICD-10-CM | POA: Diagnosis not present

## 2017-09-26 DIAGNOSIS — J188 Other pneumonia, unspecified organism: Secondary | ICD-10-CM | POA: Diagnosis not present

## 2017-09-26 DIAGNOSIS — I739 Peripheral vascular disease, unspecified: Secondary | ICD-10-CM | POA: Diagnosis not present

## 2017-09-26 DIAGNOSIS — B350 Tinea barbae and tinea capitis: Secondary | ICD-10-CM | POA: Diagnosis not present

## 2017-09-26 DIAGNOSIS — R1311 Dysphagia, oral phase: Secondary | ICD-10-CM | POA: Diagnosis not present

## 2017-09-26 DIAGNOSIS — M6281 Muscle weakness (generalized): Secondary | ICD-10-CM | POA: Diagnosis not present

## 2017-09-27 DIAGNOSIS — R41841 Cognitive communication deficit: Secondary | ICD-10-CM | POA: Diagnosis not present

## 2017-09-27 DIAGNOSIS — R1311 Dysphagia, oral phase: Secondary | ICD-10-CM | POA: Diagnosis not present

## 2017-09-27 DIAGNOSIS — J188 Other pneumonia, unspecified organism: Secondary | ICD-10-CM | POA: Diagnosis not present

## 2017-09-27 DIAGNOSIS — M6281 Muscle weakness (generalized): Secondary | ICD-10-CM | POA: Diagnosis not present

## 2017-09-27 DIAGNOSIS — S14122D Central cord syndrome at C2 level of cervical spinal cord, subsequent encounter: Secondary | ICD-10-CM | POA: Diagnosis not present

## 2017-09-27 DIAGNOSIS — G8250 Quadriplegia, unspecified: Secondary | ICD-10-CM | POA: Diagnosis not present

## 2017-09-28 DIAGNOSIS — M6281 Muscle weakness (generalized): Secondary | ICD-10-CM | POA: Diagnosis not present

## 2017-09-28 DIAGNOSIS — J188 Other pneumonia, unspecified organism: Secondary | ICD-10-CM | POA: Diagnosis not present

## 2017-09-28 DIAGNOSIS — R1311 Dysphagia, oral phase: Secondary | ICD-10-CM | POA: Diagnosis not present

## 2017-09-28 DIAGNOSIS — S14122D Central cord syndrome at C2 level of cervical spinal cord, subsequent encounter: Secondary | ICD-10-CM | POA: Diagnosis not present

## 2017-09-28 DIAGNOSIS — G8250 Quadriplegia, unspecified: Secondary | ICD-10-CM | POA: Diagnosis not present

## 2017-09-28 DIAGNOSIS — R41841 Cognitive communication deficit: Secondary | ICD-10-CM | POA: Diagnosis not present

## 2017-09-28 DIAGNOSIS — B350 Tinea barbae and tinea capitis: Secondary | ICD-10-CM | POA: Diagnosis not present

## 2017-09-29 DIAGNOSIS — R41841 Cognitive communication deficit: Secondary | ICD-10-CM | POA: Diagnosis not present

## 2017-09-29 DIAGNOSIS — R1311 Dysphagia, oral phase: Secondary | ICD-10-CM | POA: Diagnosis not present

## 2017-09-29 DIAGNOSIS — J188 Other pneumonia, unspecified organism: Secondary | ICD-10-CM | POA: Diagnosis not present

## 2017-09-29 DIAGNOSIS — M6281 Muscle weakness (generalized): Secondary | ICD-10-CM | POA: Diagnosis not present

## 2017-09-29 DIAGNOSIS — S14122D Central cord syndrome at C2 level of cervical spinal cord, subsequent encounter: Secondary | ICD-10-CM | POA: Diagnosis not present

## 2017-09-29 DIAGNOSIS — G8250 Quadriplegia, unspecified: Secondary | ICD-10-CM | POA: Diagnosis not present

## 2017-09-30 DIAGNOSIS — G8250 Quadriplegia, unspecified: Secondary | ICD-10-CM | POA: Diagnosis not present

## 2017-09-30 DIAGNOSIS — J188 Other pneumonia, unspecified organism: Secondary | ICD-10-CM | POA: Diagnosis not present

## 2017-09-30 DIAGNOSIS — S14122D Central cord syndrome at C2 level of cervical spinal cord, subsequent encounter: Secondary | ICD-10-CM | POA: Diagnosis not present

## 2017-09-30 DIAGNOSIS — R41841 Cognitive communication deficit: Secondary | ICD-10-CM | POA: Diagnosis not present

## 2017-09-30 DIAGNOSIS — R1311 Dysphagia, oral phase: Secondary | ICD-10-CM | POA: Diagnosis not present

## 2017-09-30 DIAGNOSIS — M6281 Muscle weakness (generalized): Secondary | ICD-10-CM | POA: Diagnosis not present

## 2017-10-03 DIAGNOSIS — R41841 Cognitive communication deficit: Secondary | ICD-10-CM | POA: Diagnosis not present

## 2017-10-03 DIAGNOSIS — M6281 Muscle weakness (generalized): Secondary | ICD-10-CM | POA: Diagnosis not present

## 2017-10-03 DIAGNOSIS — G8250 Quadriplegia, unspecified: Secondary | ICD-10-CM | POA: Diagnosis not present

## 2017-10-03 DIAGNOSIS — J188 Other pneumonia, unspecified organism: Secondary | ICD-10-CM | POA: Diagnosis not present

## 2017-10-03 DIAGNOSIS — R1311 Dysphagia, oral phase: Secondary | ICD-10-CM | POA: Diagnosis not present

## 2017-10-03 DIAGNOSIS — S14122D Central cord syndrome at C2 level of cervical spinal cord, subsequent encounter: Secondary | ICD-10-CM | POA: Diagnosis not present

## 2017-10-03 NOTE — Progress Notes (Signed)
PT Evaluation Addendum Late entry for missed G-code on 09-04-17 Based on review of documentation and goals    09/04/2017 1201  PT G-Codes **NOT FOR INPATIENT CLASS**  Functional Assessment Tool Used Clinical judgement (chart review)  Functional Limitation Mobility: Walking and moving around  Mobility: Walking and Moving Around Current Status (L0786) CN  Mobility: Walking and Moving Around Goal Status (L5449) CK  PT Evaluation  $PT Eval High Complexity 1 High (15)  10/03/2017 [place], PT [phone]

## 2017-10-03 NOTE — Progress Notes (Signed)
   08/18/17 1227  Swallow Evaluation Recommendations  Medication Administration Whole meds with liquid  Compensations Slow rate;Small sips/bites;Follow solids with liquid;Lingual sweep for clearance of pocketing  Treatment Plan  Oral Care Recommendations Oral care BID  SLP G-Codes **NOT FOR INPATIENT CLASS**  Functional Assessment Tool Used skilled clinical judgement via chart review  Functional Limitations Swallowing  Swallow Current Status (R1165) CI  Swallow Goal Status (B9038) CI  Information entered late for original evaluation complete by Windell Moulding, SLP on 08/18/17 [place], [place] [phone]

## 2017-10-04 DIAGNOSIS — G8250 Quadriplegia, unspecified: Secondary | ICD-10-CM | POA: Diagnosis not present

## 2017-10-04 DIAGNOSIS — S14122D Central cord syndrome at C2 level of cervical spinal cord, subsequent encounter: Secondary | ICD-10-CM | POA: Diagnosis not present

## 2017-10-04 DIAGNOSIS — R1311 Dysphagia, oral phase: Secondary | ICD-10-CM | POA: Diagnosis not present

## 2017-10-04 DIAGNOSIS — R41841 Cognitive communication deficit: Secondary | ICD-10-CM | POA: Diagnosis not present

## 2017-10-04 DIAGNOSIS — M6281 Muscle weakness (generalized): Secondary | ICD-10-CM | POA: Diagnosis not present

## 2017-10-04 DIAGNOSIS — J188 Other pneumonia, unspecified organism: Secondary | ICD-10-CM | POA: Diagnosis not present

## 2017-10-05 ENCOUNTER — Inpatient Hospital Stay (HOSPITAL_COMMUNITY)
Admission: EM | Admit: 2017-10-05 | Discharge: 2017-10-10 | DRG: 871 | Disposition: A | Discharge status: Skilled Nursing Facility | Payer: Medicare Other | Attending: Family Medicine | Admitting: Family Medicine

## 2017-10-05 ENCOUNTER — Emergency Department (HOSPITAL_COMMUNITY): Payer: Medicare Other

## 2017-10-05 ENCOUNTER — Other Ambulatory Visit: Payer: Self-pay

## 2017-10-05 ENCOUNTER — Encounter (HOSPITAL_COMMUNITY): Payer: Self-pay | Admitting: *Deleted

## 2017-10-05 ENCOUNTER — Inpatient Hospital Stay (HOSPITAL_COMMUNITY): Payer: Medicare Other

## 2017-10-05 DIAGNOSIS — R6520 Severe sepsis without septic shock: Secondary | ICD-10-CM | POA: Diagnosis not present

## 2017-10-05 DIAGNOSIS — R270 Ataxia, unspecified: Secondary | ICD-10-CM | POA: Diagnosis not present

## 2017-10-05 DIAGNOSIS — Y95 Nosocomial condition: Secondary | ICD-10-CM | POA: Diagnosis present

## 2017-10-05 DIAGNOSIS — N390 Urinary tract infection, site not specified: Secondary | ICD-10-CM | POA: Diagnosis not present

## 2017-10-05 DIAGNOSIS — K5900 Constipation, unspecified: Secondary | ICD-10-CM | POA: Diagnosis not present

## 2017-10-05 DIAGNOSIS — R319 Hematuria, unspecified: Secondary | ICD-10-CM

## 2017-10-05 DIAGNOSIS — E871 Hypo-osmolality and hyponatremia: Secondary | ICD-10-CM

## 2017-10-05 DIAGNOSIS — K592 Neurogenic bowel, not elsewhere classified: Secondary | ICD-10-CM | POA: Diagnosis not present

## 2017-10-05 DIAGNOSIS — R509 Fever, unspecified: Secondary | ICD-10-CM

## 2017-10-05 DIAGNOSIS — F419 Anxiety disorder, unspecified: Secondary | ICD-10-CM | POA: Diagnosis present

## 2017-10-05 DIAGNOSIS — J189 Pneumonia, unspecified organism: Secondary | ICD-10-CM | POA: Diagnosis not present

## 2017-10-05 DIAGNOSIS — I10 Essential (primary) hypertension: Secondary | ICD-10-CM | POA: Diagnosis present

## 2017-10-05 DIAGNOSIS — A419 Sepsis, unspecified organism: Secondary | ICD-10-CM | POA: Diagnosis not present

## 2017-10-05 DIAGNOSIS — N179 Acute kidney failure, unspecified: Secondary | ICD-10-CM

## 2017-10-05 DIAGNOSIS — F329 Major depressive disorder, single episode, unspecified: Secondary | ICD-10-CM | POA: Diagnosis present

## 2017-10-05 DIAGNOSIS — I248 Other forms of acute ischemic heart disease: Secondary | ICD-10-CM | POA: Diagnosis present

## 2017-10-05 DIAGNOSIS — B964 Proteus (mirabilis) (morganii) as the cause of diseases classified elsewhere: Secondary | ICD-10-CM | POA: Diagnosis not present

## 2017-10-05 DIAGNOSIS — R5383 Other fatigue: Secondary | ICD-10-CM | POA: Diagnosis not present

## 2017-10-05 DIAGNOSIS — N319 Neuromuscular dysfunction of bladder, unspecified: Secondary | ICD-10-CM | POA: Diagnosis not present

## 2017-10-05 DIAGNOSIS — Z7982 Long term (current) use of aspirin: Secondary | ICD-10-CM | POA: Diagnosis not present

## 2017-10-05 DIAGNOSIS — E872 Acidosis: Secondary | ICD-10-CM | POA: Diagnosis present

## 2017-10-05 DIAGNOSIS — L89324 Pressure ulcer of left buttock, stage 4: Secondary | ICD-10-CM

## 2017-10-05 DIAGNOSIS — E860 Dehydration: Secondary | ICD-10-CM | POA: Diagnosis present

## 2017-10-05 DIAGNOSIS — R29810 Facial weakness: Secondary | ICD-10-CM | POA: Diagnosis present

## 2017-10-05 DIAGNOSIS — G8250 Quadriplegia, unspecified: Secondary | ICD-10-CM | POA: Diagnosis not present

## 2017-10-05 DIAGNOSIS — F209 Schizophrenia, unspecified: Secondary | ICD-10-CM | POA: Diagnosis not present

## 2017-10-05 DIAGNOSIS — R0682 Tachypnea, not elsewhere classified: Secondary | ICD-10-CM | POA: Diagnosis not present

## 2017-10-05 DIAGNOSIS — R310 Gross hematuria: Secondary | ICD-10-CM

## 2017-10-05 HISTORY — DX: Quadriplegia, unspecified: G82.50

## 2017-10-05 LAB — CBC WITH DIFFERENTIAL/PLATELET
BASOS PCT: 0 %
Basophils Absolute: 0 10*3/uL (ref 0.0–0.1)
EOS ABS: 0.7 10*3/uL (ref 0.0–0.7)
Eosinophils Relative: 10 %
HEMATOCRIT: 36.1 % — AB (ref 39.0–52.0)
HEMOGLOBIN: 11.8 g/dL — AB (ref 13.0–17.0)
LYMPHS PCT: 7 %
Lymphs Abs: 0.5 10*3/uL — ABNORMAL LOW (ref 0.7–4.0)
MCH: 26.9 pg (ref 26.0–34.0)
MCHC: 32.7 g/dL (ref 30.0–36.0)
MCV: 82.4 fL (ref 78.0–100.0)
Monocytes Absolute: 0.4 10*3/uL (ref 0.1–1.0)
Monocytes Relative: 6 %
Neutro Abs: 4.9 10*3/uL (ref 1.7–7.7)
Neutrophils Relative %: 77 %
Platelets: 300 10*3/uL (ref 150–400)
RBC: 4.38 MIL/uL (ref 4.22–5.81)
RDW: 12.4 % (ref 11.5–15.5)
WBC: 6.5 10*3/uL (ref 4.0–10.5)

## 2017-10-05 LAB — COMPREHENSIVE METABOLIC PANEL
ALK PHOS: 55 U/L (ref 38–126)
ALT: 24 U/L (ref 17–63)
AST: 34 U/L (ref 15–41)
Albumin: 2.8 g/dL — ABNORMAL LOW (ref 3.5–5.0)
Anion gap: 14 (ref 5–15)
BILIRUBIN TOTAL: 1.5 mg/dL — AB (ref 0.3–1.2)
BUN: 43 mg/dL — ABNORMAL HIGH (ref 6–20)
CO2: 20 mmol/L — ABNORMAL LOW (ref 22–32)
Calcium: 9.2 mg/dL (ref 8.9–10.3)
Chloride: 89 mmol/L — ABNORMAL LOW (ref 101–111)
Creatinine, Ser: 1.66 mg/dL — ABNORMAL HIGH (ref 0.61–1.24)
GFR calc Af Amer: 47 mL/min — ABNORMAL LOW (ref >60)
GFR calc non Af Amer: 40 mL/min — ABNORMAL LOW (ref >60)
Glucose, Bld: 170 mg/dL — ABNORMAL HIGH (ref 65–99)
POTASSIUM: 5 mmol/L (ref 3.5–5.1)
Sodium: 123 mmol/L — ABNORMAL LOW (ref 135–145)
TOTAL PROTEIN: 6.9 g/dL (ref 6.5–8.1)

## 2017-10-05 LAB — URINALYSIS, MICROSCOPIC (REFLEX)

## 2017-10-05 LAB — URINALYSIS, ROUTINE W REFLEX MICROSCOPIC
GLUCOSE, UA: NEGATIVE mg/dL
KETONES UR: NEGATIVE mg/dL
Nitrite: NEGATIVE
PH: 7.5 (ref 5.0–8.0)
Protein, ur: 100 mg/dL — AB
Specific Gravity, Urine: 1.01 (ref 1.005–1.030)

## 2017-10-05 LAB — I-STAT CG4 LACTIC ACID, ED
Lactic Acid, Venous: 2.09 mmol/L (ref 0.5–1.9)
Lactic Acid, Venous: 1.36 mmol/L (ref 0.5–1.9)

## 2017-10-05 LAB — PROTIME-INR
INR: 1.56
Prothrombin Time: 18.5 seconds — ABNORMAL HIGH (ref 11.4–15.2)

## 2017-10-05 LAB — TROPONIN I: Troponin I: 0.1 ng/mL (ref <0.03)

## 2017-10-05 MED ORDER — VANCOMYCIN HCL IN DEXTROSE 1-5 GM/200ML-% IV SOLN
1000.0000 mg | INTRAVENOUS | Status: AC
  Administered 2017-10-06: 1000 mg via INTRAVENOUS
  Filled 2017-10-05: qty 200

## 2017-10-05 MED ORDER — VANCOMYCIN HCL IN DEXTROSE 1-5 GM/200ML-% IV SOLN
1000.0000 mg | Freq: Once | INTRAVENOUS | Status: AC
  Administered 2017-10-05: 1000 mg via INTRAVENOUS
  Filled 2017-10-05: qty 200

## 2017-10-05 MED ORDER — SODIUM CHLORIDE 0.9 % IV SOLN
1000.0000 mL | INTRAVENOUS | Status: DC
  Administered 2017-10-05: 1000 mL via INTRAVENOUS

## 2017-10-05 MED ORDER — ENOXAPARIN SODIUM 40 MG/0.4ML ~~LOC~~ SOLN
40.0000 mg | SUBCUTANEOUS | Status: DC
  Administered 2017-10-05 – 2017-10-08 (×4): 40 mg via SUBCUTANEOUS
  Filled 2017-10-05 (×4): qty 0.4

## 2017-10-05 MED ORDER — PIPERACILLIN-TAZOBACTAM 3.375 G IVPB
3.3750 g | Freq: Three times a day (TID) | INTRAVENOUS | Status: DC
  Administered 2017-10-05 – 2017-10-06 (×2): 3.375 g via INTRAVENOUS
  Filled 2017-10-05 (×2): qty 50

## 2017-10-05 MED ORDER — SODIUM CHLORIDE 0.9 % IV BOLUS (SEPSIS)
1000.0000 mL | Freq: Once | INTRAVENOUS | Status: AC
  Administered 2017-10-05: 1000 mL via INTRAVENOUS

## 2017-10-05 MED ORDER — BISACODYL 10 MG RE SUPP
10.0000 mg | Freq: Once | RECTAL | Status: AC
  Administered 2017-10-05: 10 mg via RECTAL
  Filled 2017-10-05: qty 1

## 2017-10-05 MED ORDER — ACETAMINOPHEN 650 MG RE SUPP
RECTAL | Status: AC
  Administered 2017-10-05: 14:00:00
  Filled 2017-10-05: qty 1

## 2017-10-05 MED ORDER — PIPERACILLIN-TAZOBACTAM 3.375 G IVPB 30 MIN
3.3750 g | Freq: Once | INTRAVENOUS | Status: AC
  Administered 2017-10-05: 3.375 g via INTRAVENOUS
  Filled 2017-10-05: qty 50

## 2017-10-05 MED ORDER — SODIUM CHLORIDE 0.9 % IV SOLN
INTRAVENOUS | Status: DC
  Administered 2017-10-05: 22:00:00 via INTRAVENOUS

## 2017-10-05 MED ORDER — PNEUMOCOCCAL VAC POLYVALENT 25 MCG/0.5ML IJ INJ: 0.5000 mL | INJECTION | INTRAMUSCULAR | Status: DC

## 2017-10-05 NOTE — ED Triage Notes (Signed)
Sent from [facility] for evaluation of fever, has foley with gross bloody output

## 2017-10-05 NOTE — ED Notes (Signed)
Call to 3A, RN to call back 

## 2017-10-05 NOTE — H&P (Signed)
History and Physical    Dean Oliver ZOX:[id number] DOB: [birth year] DOA: 10/05/2017    PCP: Patient, No Pcp Per  Patient coming from: SNF  Chief Complaint: fever  HPI: Dean Oliver is a 72 y.o. male with medical history of quadriplegia, schizophrenia chronic foley cath who presents with a fever of 102 and bloody urine after a catheter change today. At baseline, he is alert and communicative but his cousins who are giving me a history state that he has been lethargic. Of note, CT scan suggests a pneumonia. His cousin noted him coughing today. He was also in close proximity and was drinking sharing utensils with a person who has been diagnosed with a pneumonia and has been hospitalized.  In the ER, the cousins also notice a left facial droop which they have not noticed before.  ED Course: Vanc and Zosyn given CT renal stone study shows lung infiltrates- no renal stones or blood clots.   Review of Systems:  All other systems reviewed and apart from HPI, are negative.  Past Medical History:  Diagnosis Date  . Cataracts, bilateral   . HTN (hypertension)   . Schizophrenia [facility])     Past Surgical History:  Procedure Laterality Date  . None      Social History:   reports that  has never smoked. he has never used smokeless tobacco. He reports that he does not drink alcohol or use drugs.  No Known Allergies  Family History  Family history unknown: Yes     Prior to Admission medications   Medication Sig Start Date End Date Taking? Authorizing Provider  ALPRAZolam (XANAX) 0.25 MG tablet Take 1 tablet (0.25 mg total) by mouth 2 (two) times daily as needed for anxiety. 09/07/17   Angiulli, Lavon Paganini, PA-C  aspirin EC 81 MG EC tablet Take 1 tablet (81 mg total) by mouth daily. 08/18/17   Allie Bossier, MD  Baclofen 5 MG TABS Take 5 mg by mouth 2 (two) times daily. 09/07/17   Angiulli, Lavon Paganini, PA-C  HYDROcodone-acetaminophen (NORCO/VICODIN) 5-325 MG tablet Take 1-2 tablets by  mouth every 6 (six) hours as needed for moderate pain. 09/07/17   Angiulli, Lavon Paganini, PA-C  Multiple Vitamins-Minerals (CERTAVITE SENIOR/ANTIOXIDANT) TABS Take 1 tablet by mouth daily.    [provider]  risperiDONE (RISPERDAL) 0.5 MG tablet Take 1 tablet (0.5 mg total) by mouth 2 (two) times daily. 09/07/17   Angiulli, Lavon Paganini, PA-C  senna-docusate (SENOKOT-S) 8.6-50 MG tablet Take 2 tablets by mouth at bedtime. 09/07/17   Angiulli, Lavon Paganini, PA-C  sodium phosphate (FLEET) 7-19 GM/118ML ENEM Place 133 mLs (1 enema total) rectally daily as needed for severe constipation. 09/07/17   Angiulli, Lavon Paganini, PA-C  sulfamethoxazole-trimethoprim (BACTRIM DS,SEPTRA DS) 800-160 MG tablet Take 1 tablet by mouth every 12 (twelve) hours. 09/07/17   Angiulli, Lavon Paganini, PA-C  tiZANidine (ZANAFLEX) 4 MG tablet Take 1 tablet (4 mg total) by mouth every 6 (six) hours as needed for muscle spasms. 09/07/17   Cathlyn Parsons, PA-C    Physical Exam: Wt Readings from Last 3 Encounters:  10/05/17 64.9 kg (143 lb 3 oz)  08/18/17 72.7 kg (160 lb 4.4 oz)   Vitals:   10/05/17 1630 10/05/17 1638 10/05/17 1700 10/05/17 1704  BP: (!) 85/63  (!) 79/57 (!) 82/60  Pulse: (!) 114  (!) 111 (!) 111  Resp: 13  15 18   Temp:  100.1 F (37.8 C)    TempSrc:  Oral  SpO2: 94%  95% 94%  Weight:          Constitutional: NAD, calm, comfortable- awakes for seconds, tries to speak but falls asleep quickly Eyes: PERTLA, lids and conjunctivae normal ENMT: Mucous membranes are dry- he has severe thrush - no teeth Neck: normal, supple, no masses, no thyromegaly Respiratory: clear to auscultation bilaterally, no wheezing, no crackles. Normal respiratory effort. No accessory muscle use. + dry cough on exam Cardiovascular: S1 & S2 heard, regular rate and rhythm, no murmurs / rubs / gallops. No extremity edema. 2+ pedal pulses. No carotid bruits.  Abdomen: No distension, no tenderness, no masses palpated. No  hepatosplenomegaly. Bowel sounds normal.  Musculoskeletal: no clubbing / cyanosis. No joint deformity upper and lower extremities. Good ROM, no contractures. Normal muscle tone.  Skin: no rashes, lesions, ulcers. No induration Neurologic: unable to move arms or legs- has a left facial droop Psychiatric: unable to assess    Labs on Admission: I have personally reviewed following labs and imaging studies  CBC: Recent Labs  Lab 10/05/17 1444  WBC 6.5  NEUTROABS 4.9  HGB 11.8*  HCT 36.1*  MCV 82.4  PLT 604   Basic Metabolic Panel: Recent Labs  Lab 10/05/17 1444  NA 123*  K 5.0  CL 89*  CO2 20*  GLUCOSE 170*  BUN 43*  CREATININE 1.66*  CALCIUM 9.2   GFR: Estimated Creatinine Clearance: 38.6 mL/min (A) (by C-G formula based on SCr of 1.66 mg/dL (H)). Liver Function Tests: Recent Labs  Lab 10/05/17 1444  AST 34  ALT 24  ALKPHOS 55  BILITOT 1.5*  PROT 6.9  ALBUMIN 2.8*   No results for input(s): LIPASE, AMYLASE in the last 168 hours. No results for input(s): AMMONIA in the last 168 hours. Coagulation Profile: Recent Labs  Lab 10/05/17 1444  INR 1.56   Cardiac Enzymes: Recent Labs  Lab 10/05/17 1444  TROPONINI 0.10*   BNP (last 3 results) No results for input(s): PROBNP in the last 8760 hours. HbA1C: No results for input(s): HGBA1C in the last 72 hours. CBG: No results for input(s): GLUCAP in the last 168 hours. Lipid Profile: No results for input(s): CHOL, HDL, LDLCALC, TRIG, CHOLHDL, LDLDIRECT in the last 72 hours. Thyroid Function Tests: No results for input(s): TSH, T4TOTAL, FREET4, T3FREE, THYROIDAB in the last 72 hours. Anemia Panel: No results for input(s): VITAMINB12, FOLATE, FERRITIN, TIBC, IRON, RETICCTPCT in the last 72 hours. Urine analysis:    Component Value Date/Time   COLORURINE STRAW (A) 10/05/2017 1540   APPEARANCEUR HAZY (A) 10/05/2017 1540   LABSPEC 1.010 10/05/2017 1540   PHURINE 7.5 10/05/2017 1540   GLUCOSEU NEGATIVE  10/05/2017 1540   HGBUR LARGE (A) 10/05/2017 1540   BILIRUBINUR SMALL (A) 10/05/2017 1540   KETONESUR NEGATIVE 10/05/2017 1540   PROTEINUR 100 (A) 10/05/2017 1540   NITRITE NEGATIVE 10/05/2017 1540   LEUKOCYTESUR LARGE (A) 10/05/2017 1540   Sepsis Labs: @LABRCNTIP (procalcitonin:4,lacticidven:4) ) Recent Results (from the past 240 hour(s))  Culture, blood (Routine x 2)     Status: None (Preliminary result)   Collection Time: 10/05/17  2:44 PM  Result Value Ref Range Status   Specimen Description BLOOD RIGHT FOREARM  Final   Special Requests   Final    BOTTLES DRAWN AEROBIC AND ANAEROBIC Blood Culture adequate volume   Culture PENDING  Incomplete   Report Status PENDING  Incomplete  Culture, blood (Routine x 2)     Status: None (Preliminary result)   Collection Time: 10/05/17  2:52 PM  Result Value Ref Range Status   Specimen Description LEFT ANTECUBITAL  Final   Special Requests   Final    BOTTLES DRAWN AEROBIC AND ANAEROBIC Blood Culture results may not be optimal due to an inadequate volume of blood received in culture bottles   Culture PENDING  Incomplete   Report Status PENDING  Incomplete     Radiological Exams on Admission: Dg Chest Port 1 View  Result Date: 10/05/2017 CLINICAL DATA:  72 year old male code sepsis.  Fever and hematuria. EXAM: PORTABLE CHEST 1 VIEW COMPARISON:  08/12/2017 portable chest. FINDINGS: Portable AP upright view at 1557 hrs. Similar low lung volumes. Mediastinal contours are stable and within normal limits. No pneumothorax, pulmonary edema, pleural effusion or consolidation. Mild perihilar crowding of of lung markings with no convincing airspace opacity. Mild gaseous distension of bowel in the visible left abdomen. IMPRESSION: Low lung volumes, otherwise no acute cardiopulmonary abnormality. Electronically Signed   By: Genevie Ann M.D.   On: 10/05/2017 16:15    EKG: Independently reviewed. Sinus tach at 123bpm  Assessment/Plan Principal Problem:    Severe Sepsis secondary to UTI vs Pneumonia (HCAP)   Dehydration with severe hyponatremia   AKI (acute kidney injury) ([place]) - fever, hypotension, leukocytosis, cough, lactic acidosis - previously grew out Proteus and Enterococcus in urine which was resistant to Cipro - NOTE: UA in setting of chronic foley can often be + for bacteria and WBCs - I have asked the ED to obtain imaging due to the hematuria-  CT renal stone study has been ordered and shows b/l pulmonary infiltrates & diffuse thickening of the bladder wall  - has received Vanc and Zosyn in the ER - aggressive IV hydration has been initiated - repeat LA is normal now- BP Improved   Hematuria - ? Due to catheter change vs due to UTI- follow  Lethargy - NPO for now - obtain SLP eval to ensure infiltrates not secondary to aspiration  Left facial droop - quite significant on my exam- cousins state this is a new finding - check CT head- will need to start Aspirin and stroke work up if positive    Schizophrenia ([place])   Neurogenic bladder   Neurogenic bowel   Quadriplegia ([place]) - has chronic foley - cont Baclofen and Tizanidine  Anxiety/depression? - cont Xanax and Risperdone  Constipation - CT shows>> Prominent stool in the rectum and descending colon with gaseous distention of the colon - will order a laxative   DVT prophylaxis: Lovenox Code Status: Full code  Family Communication:   Disposition Plan:   Consults called: none  Admission status: admit to med/surg    Debbe Odea MD [facility] Pager: [email or web address] Password [email or web address] 7PM-7AM, please contact night-coverage   10/05/2017, 5:41 PM

## 2017-10-05 NOTE — ED Provider Notes (Signed)
[facility] EMERGENCY DEPARTMENT Provider Note   CSN: [id number] Arrival date & time: 10/05/17  1400     History   Chief Complaint Chief Complaint  Patient presents with  . Fever    HPI SEAB AXEL is a 72 y.o. male.  The history is provided by the nursing home and the EMS personnel. The history is limited by the condition of the patient.  Fever    Pt was seen at 1445. Per EMS and NH report:  Pt sent to ED for evaluation of fever. Foley with gross bloody output after change at [facility] yesterday. Pt has hx schizophrenia and quadriplegia.   Past Medical History:  Diagnosis Date  . Cataracts, bilateral   . HTN (hypertension)   . Schizophrenia [facility])     Patient Active Problem List   Diagnosis Date Noted  . Neurogenic bladder 08/18/2017  . Neurogenic bowel 08/18/2017  . Bacterial UTI 08/18/2017  . Central cord syndrome ([place]) 08/17/2017  . Dysphagia   . Fall   . Hypotension   . Sinus bradycardia   . HNP (herniated nucleus pulposus) with myelopathy, cervical   . Benign essential HTN   . Hyponatremia   . Acute blood loss anemia   . Symptomatic bradycardia 08/12/2017  . Schizophrenia [facility])     Past Surgical History:  Procedure Laterality Date  . None         Home Medications    Prior to Admission medications   Medication Sig Start Date End Date Taking? Authorizing Provider  ALPRAZolam (XANAX) 0.25 MG tablet Take 1 tablet (0.25 mg total) by mouth 2 (two) times daily as needed for anxiety. 09/07/17   Angiulli, Lavon Paganini, PA-C  aspirin EC 81 MG EC tablet Take 1 tablet (81 mg total) by mouth daily. 08/18/17   Allie Bossier, MD  Baclofen 5 MG TABS Take 5 mg by mouth 2 (two) times daily. 09/07/17   Angiulli, Lavon Paganini, PA-C  HYDROcodone-acetaminophen (NORCO/VICODIN) 5-325 MG tablet Take 1-2 tablets by mouth every 6 (six) hours as needed for moderate pain. 09/07/17   Angiulli, Lavon Paganini, PA-C  Multiple Vitamins-Minerals (CERTAVITE SENIOR/ANTIOXIDANT) TABS Take 1 tablet by  mouth daily.    [provider]  risperiDONE (RISPERDAL) 0.5 MG tablet Take 1 tablet (0.5 mg total) by mouth 2 (two) times daily. 09/07/17   Angiulli, Lavon Paganini, PA-C  senna-docusate (SENOKOT-S) 8.6-50 MG tablet Take 2 tablets by mouth at bedtime. 09/07/17   Angiulli, Lavon Paganini, PA-C  sodium phosphate (FLEET) 7-19 GM/118ML ENEM Place 133 mLs (1 enema total) rectally daily as needed for severe constipation. 09/07/17   Angiulli, Lavon Paganini, PA-C  sulfamethoxazole-trimethoprim (BACTRIM DS,SEPTRA DS) 800-160 MG tablet Take 1 tablet by mouth every 12 (twelve) hours. 09/07/17   Angiulli, Lavon Paganini, PA-C  tiZANidine (ZANAFLEX) 4 MG tablet Take 1 tablet (4 mg total) by mouth every 6 (six) hours as needed for muscle spasms. 09/07/17   Angiulli, Lavon Paganini, PA-C    Family History Family History  Family history unknown: Yes    Social History Social History   Tobacco Use  . Smoking status: Never Smoker  . Smokeless tobacco: Never Used  Substance Use Topics  . Alcohol use: No    Frequency: Never  . Drug use: No     Allergies   Patient has no known allergies.   Review of Systems Review of Systems  Unable to perform ROS: Acuity of condition  Constitutional: Positive for fever.     Physical Exam  Updated Vital Signs BP (!) 85/63   Pulse (!) 114   Temp 100.1 F (37.8 C) (Oral)   Resp 13   Wt 64.9 kg (143 lb 3 oz)   SpO2 94%   BMI 20.84 kg/m    Patient Vitals for the past 24 hrs:  BP Temp Temp src Pulse Resp SpO2 Weight  10/05/17 1638 - 100.1 F (37.8 C) Oral - - - -  10/05/17 1630 (!) 85/63 - - (!) 114 13 94 % -  10/05/17 1600 (!) 89/66 - - (!) 121 18 94 % -  10/05/17 1530 94/66 - - (!) 122 19 95 % -  10/05/17 1500 106/73 - - (!) 119 18 94 % -  10/05/17 1439 - - - - - - 64.9 kg (143 lb 3 oz)  10/05/17 1438 112/68 (!) 102.7 F (39.3 C) Rectal (!) 117 18 96 % -  10/05/17 1437 - - - - - 93 % -  10/05/17 1430 112/68 - - (!) 116 17 94 % -  10/05/17 1411 109/69 (!) 102.3 F  (39.1 C) Rectal (!) 127 19 93 % -     Physical Exam 1450: Physical examination:  Nursing notes reviewed; Vital signs and O2 SAT reviewed; +febrile.;; Constitutional: Well developed, Well nourished, In no acute distress; Head:  Normocephalic, atraumatic; Eyes: EOMI, PERRL, No scleral icterus; ENMT: Mouth and pharynx normal, Mucous membranes dry; Neck: Supple, Full range of motion, No lymphadenopathy; Cardiovascular: Tachycardic rate and rhythm, No gallop; Respiratory: Breath sounds clear & equal bilaterally, No wheezes.  Speaking full sentences with ease, Normal respiratory effort/excursion; Chest: Nontender, Movement normal; Abdomen: Soft, Nontender, Nondistended, Normal bowel sounds; Genitourinary: Foley draining freely, +hematuria, no clots noted..; Extremities: Pulses normal, No tenderness, No edema, No calf edema or asymmetry.; Neuro: Laying supine, eyes closed. No facial droop. Minimal speech. Quadriplegia per hx.; Skin: Color normal, Warm, Dry.   ED Treatments / Results  Labs (all labs ordered are listed, but only abnormal results are displayed)   EKG  EKG Interpretation  Date/Time:  Wednesday October 05 2017 14:08:47 EST Ventricular Rate:  123 PR Interval:    QRS Duration: 82 QT Interval:  287 QTC Calculation: 411 R Axis:   7 Text Interpretation:  Sinus tachycardia Borderline low voltage, extremity leads Since last tracing rate slower and QT has shortened Confirmed by Daleen Bo [phone]) on 10/05/2017 2:12:07 PM       Radiology   Procedures Procedures (including critical care time)  Medications Ordered in ED Medications  0.9 %  sodium chloride infusion (1,000 mLs Intravenous New Bag/Given 10/05/17 1457)  vancomycin (VANCOCIN) IVPB 1000 mg/200 mL premix (1,000 mg Intravenous New Bag/Given 10/05/17 1637)  piperacillin-tazobactam (ZOSYN) IVPB 3.375 g (not administered)  vancomycin (VANCOCIN) IVPB 1000 mg/200 mL premix (not administered)  acetaminophen (TYLENOL) 650 MG  suppository (  Given 10/05/17 1415)  piperacillin-tazobactam (ZOSYN) IVPB 3.375 g (0 g Intravenous Stopped 10/05/17 1627)  sodium chloride 0.9 % bolus 1,000 mL (1,000 mLs Intravenous New Bag/Given 10/05/17 1636)  sodium chloride 0.9 % bolus 1,000 mL (1,000 mLs Intravenous New Bag/Given 10/05/17 1643)     Initial Impression / Assessment and Plan / ED Course  I have reviewed the triage vital signs and the nursing notes.  Pertinent labs & imaging results that were available during my care of the patient were reviewed by me and considered in my medical decision making (see chart for details).  MDM Reviewed: previous chart, nursing note and vitals Reviewed previous: labs and ECG  Interpretation: labs, ECG and x-ray Total time providing critical care: 30-74 minutes. This excludes time spent performing separately reportable procedures and services. Consults: admitting MD   CRITICAL CARE Performed by: Alfonzo Feller Total critical care time: 35 minutes Critical care time was exclusive of separately billable procedures and treating other patients. Critical care was necessary to treat or prevent imminent or life-threatening deterioration. Critical care was time spent personally by me on the following activities: development of treatment plan with patient and/or surrogate as well as nursing, discussions with consultants, evaluation of patient's response to treatment, examination of patient, obtaining history from patient or surrogate, ordering and performing treatments and interventions, ordering and review of laboratory studies, ordering and review of radiographic studies, pulse oximetry and re-evaluation of patient's condition.   Results for orders placed or performed during the hospital encounter of 10/05/17  Culture, blood (Routine x 2)  Result Value Ref Range   Specimen Description BLOOD RIGHT FOREARM    Special Requests      BOTTLES DRAWN AEROBIC AND ANAEROBIC Blood Culture adequate volume    Culture PENDING    Report Status PENDING   Culture, blood (Routine x 2)  Result Value Ref Range   Specimen Description LEFT ANTECUBITAL    Special Requests      BOTTLES DRAWN AEROBIC AND ANAEROBIC Blood Culture results may not be optimal due to an inadequate volume of blood received in culture bottles   Culture PENDING    Report Status PENDING   Comprehensive metabolic panel  Result Value Ref Range   Sodium 123 (L) 135 - 145 mmol/L   Potassium 5.0 3.5 - 5.1 mmol/L   Chloride 89 (L) 101 - 111 mmol/L   CO2 20 (L) 22 - 32 mmol/L   Glucose, Bld 170 (H) 65 - 99 mg/dL   BUN 43 (H) 6 - 20 mg/dL   Creatinine, Ser 1.66 (H) 0.61 - 1.24 mg/dL   Calcium 9.2 8.9 - 10.3 mg/dL   Total Protein 6.9 6.5 - 8.1 g/dL   Albumin 2.8 (L) 3.5 - 5.0 g/dL   AST 34 15 - 41 U/L   ALT 24 17 - 63 U/L   Alkaline Phosphatase 55 38 - 126 U/L   Total Bilirubin 1.5 (H) 0.3 - 1.2 mg/dL   GFR calc non Af Amer 40 (L) >60 mL/min   GFR calc Af Amer 47 (L) >60 mL/min   Anion gap 14 5 - 15  CBC with Differential  Result Value Ref Range   WBC 6.5 4.0 - 10.5 K/uL   RBC 4.38 4.22 - 5.81 MIL/uL   Hemoglobin 11.8 (L) 13.0 - 17.0 g/dL   HCT 36.1 (L) 39.0 - 52.0 %   MCV 82.4 78.0 - 100.0 fL   MCH 26.9 26.0 - 34.0 pg   MCHC 32.7 30.0 - 36.0 g/dL   RDW 12.4 11.5 - 15.5 %   Platelets 300 150 - 400 K/uL   Neutrophils Relative % 77 %   Lymphocytes Relative 7 %   Monocytes Relative 6 %   Eosinophils Relative 10 %   Basophils Relative 0 %   Neutro Abs 4.9 1.7 - 7.7 K/uL   Lymphs Abs 0.5 (L) 0.7 - 4.0 K/uL   Monocytes Absolute 0.4 0.1 - 1.0 K/uL   Eosinophils Absolute 0.7 0.0 - 0.7 K/uL   Basophils Absolute 0.0 0.0 - 0.1 K/uL   WBC Morphology MILD LEFT SHIFT (1-5% METAS, OCC MYELO, OCC BANDS)   Protime-INR  Result Value Ref Range  Prothrombin Time 18.5 (H) 11.4 - 15.2 seconds   INR 1.56   Urinalysis, Routine w reflex microscopic  Result Value Ref Range   Color, Urine STRAW (A) YELLOW   APPearance HAZY (A) CLEAR     Specific Gravity, Urine 1.010 1.005 - 1.030   pH 7.5 5.0 - 8.0   Glucose, UA NEGATIVE NEGATIVE mg/dL   Hgb urine dipstick LARGE (A) NEGATIVE   Bilirubin Urine SMALL (A) NEGATIVE   Ketones, ur NEGATIVE NEGATIVE mg/dL   Protein, ur 100 (A) NEGATIVE mg/dL   Nitrite NEGATIVE NEGATIVE   Leukocytes, UA LARGE (A) NEGATIVE  Troponin I  Result Value Ref Range   Troponin I 0.10 (HH) <0.03 ng/mL  Urinalysis, Microscopic (reflex)  Result Value Ref Range   RBC / HPF TOO NUMEROUS TO COUNT 0 - 5 RBC/hpf   WBC, UA TOO NUMEROUS TO COUNT 0 - 5 WBC/hpf   Bacteria, UA FEW (A) NONE SEEN   Squamous Epithelial / LPF 0-5 (A) NONE SEEN   Mucus PRESENT   I-Stat CG4 Lactic Acid, ED  Result Value Ref Range   Lactic Acid, Venous 2.09 (HH) 0.5 - 1.9 mmol/L  I-Stat CG4 Lactic Acid, ED  Result Value Ref Range   Lactic Acid, Venous 1.36 0.5 - 1.9 mmol/L   Dg Chest Port 1 View Result Date: 10/05/2017 CLINICAL DATA:  72 year old male code sepsis.  Fever and hematuria. EXAM: PORTABLE CHEST 1 VIEW COMPARISON:  08/12/2017 portable chest. FINDINGS: Portable AP upright view at 1557 hrs. Similar low lung volumes. Mediastinal contours are stable and within normal limits. No pneumothorax, pulmonary edema, pleural effusion or consolidation. Mild perihilar crowding of of lung markings with no convincing airspace opacity. Mild gaseous distension of bowel in the visible left abdomen. IMPRESSION: Low lung volumes, otherwise no acute cardiopulmonary abnormality. Electronically Signed   By: Genevie Ann M.D.   On: 10/05/2017 16:15    Ct Renal Stone Study Result Date: 10/05/2017 CLINICAL DATA:  Gross hematuria.  Fever of unknown origin. EXAM: CT ABDOMEN AND PELVIS WITHOUT CONTRAST TECHNIQUE: Multidetector CT imaging of the abdomen and pelvis was performed following the standard protocol without IV contrast. COMPARISON:  None. FINDINGS: Lower chest: The patient has bilateral patchy lower lobe pulmonary infiltrates as well as a small area  of infiltrate in the lingula. No effusions. Coronary artery calcification. Heart size is normal. Hepatobiliary: 17 mm low-density lesion in the posterior aspect of the right lobe of the liver consistent with a cyst. Liver parenchyma is otherwise normal. No biliary ductal dilatation. Pancreas: The pancreas is not well-defined but there is no discrete abnormality. Spleen: Normal. Adrenals/Urinary Tract: The kidneys and adrenal glands appear normal. No hydronephrosis. A Foley catheter is in place. There is diffuse thickening of the bladder wall without a focal mass. Stomach/Bowel: Prominent stool in the rectum. Slight gaseous distention of the colon. Moderate stool in the descending colon. No free air. No dilated small bowel. Vascular/Lymphatic: No significant vascular findings are present. No enlarged abdominal or pelvic lymph nodes. Reproductive: Prostate is unremarkable. Other: There is a small amount of ascites in the abdomen and pelvis. Anasarca. Musculoskeletal: No acute or significant osseous findings. IMPRESSION: 1. Patchy bilateral lower lobe infiltrates. 2. Diffuse thickening of the bladder wall with Foley catheter in place. No mass or hydronephrosis. 3. Prominent stool in the rectum and descending colon with gaseous distention of the colon. 4. Small amount of ascites.  Anasarca. Electronically Signed   By: Lorriane Shire M.D.   On: 10/05/2017 18:05  Results for AZZAM, MEHRA (MRN [id number]) as of 10/05/2017 16:58  Ref. Range 08/22/2017 05:36 08/26/2017 09:07 08/29/2017 05:24 10/05/2017 14:44  Sodium Latest Ref Range: 135 - 145 mmol/L 129 (L) 130 (L) 132 (L) 123 (L)   Results for OSAMA, COLESON (MRN [id number]) as of 10/05/2017 16:58  Ref. Range 08/22/2017 05:36 08/26/2017 09:07 08/29/2017 05:24 10/05/2017 14:44  BUN Latest Ref Range: 6 - 20 mg/dL 10 8 11  43 (H)  Creatinine Latest Ref Range: 0.61 - 1.24 mg/dL 0.65 0.67 0.68 1.66 (H)      1640:  Febrile with tachycardia on arrival; code sepsis  called, orders placed. APAP given for fever. BP initially stable, now becoming hypotensive. IVF bolus 30mg /kg ordered. Troponin elevated, likely demand d/t sepsis. T/C to [facility] Dr. Bronson Ing, case discussed, including:  HPI, pertinent PM/SHx, VS/PE, dx testing, ED course and treatment:  Agrees regarding troponin elevation, tx underlying sepsis.   1655:  T/C to [facility] Dr. Wynelle Cleveland, case discussed, including:  HPI, pertinent PM/SHx, VS/PE, dx testing, ED course and treatment:  Agreeable to admit.   1720:  Urine coming from foley catheter now changing from frank hematuria, to pink-tinged. CT A/P pending.  T/C to [facility] Dr. Diona Fanti, case discussed, including:  HPI, pertinent PM/SHx, VS/PE, dx testing, ED course and treatment:  Agrees with CT scan, he will place note in chart.     Final Clinical Impressions(s) / ED Diagnoses   Final diagnoses:  None    ED Discharge Orders    None        Francine Graven, DO 10/07/17 2112

## 2017-10-05 NOTE — ED Notes (Signed)
Date and time results received: 10/05/17 1546   Test: Troponin Critical Value: .10  Name of Provider Notified: Thurnell Garbe  Orders Received? Or Actions Taken?: No new orders given.

## 2017-10-05 NOTE — ED Notes (Signed)
Pt repositioned again and elevated his arms and hands per family request.  Pt has some swelling in his arms, his hands and his perineal area which I noted.  No distress and pt is alert and voices feeling more comfortable after being repositioned

## 2017-10-05 NOTE — ED Notes (Signed)
Admitting MD is at the bedside at this time

## 2017-10-05 NOTE — Progress Notes (Signed)
Pharmacy Antibiotic Note  Dean Oliver is a 72 y.o. male admitted on 10/05/2017 with sepsis.  Pharmacy has been consulted for Vanco/Zosyn dosing.  Plan: Vancomycin 1000 mg IV every 24 hours.  Goal trough 15-20 mcg/mL. Zosyn 3.375g IV q8h (4 hour infusion).  Monitor labs, cultures, and vitals. Vanco trough when needed.  Weight: 143 lb 3 oz (64.9 kg)  Temp (24hrs), Avg:102.5 F (39.2 C), Min:102.3 F (39.1 C), Max:102.7 F (39.3 C)  Recent Labs  Lab 10/05/17 1420 10/05/17 1444  WBC  --  6.5  CREATININE  --  1.66*  LATICACIDVEN 2.09*  --     Estimated Creatinine Clearance: 38.6 mL/min (A) (by C-G formula based on SCr of 1.66 mg/dL (H)).    No Known Allergies  Antimicrobials this admission: Vancomycin 1/16 >>  Zosyn 1/16 >>   Dose adjustments this admission:   Microbiology results: 1/16 BCx: pending 1/16 UCx: pending    Thank you for allowing pharmacy to be a part of this patient's care.   Margot Ables, PharmD Clinical Pharmacist 10/05/2017 4:01 PM

## 2017-10-05 NOTE — ED Notes (Signed)
Patient transported to CT 

## 2017-10-05 NOTE — ED Notes (Signed)
Urine is now yellow that is coming through catheter

## 2017-10-05 NOTE — ED Notes (Signed)
BC were previously drawn

## 2017-10-05 NOTE — Consult Note (Signed)
I have spoken with Dr. Thurnell Garbe about this patient's resident dictation.  Spinal cord injured patient with long-term catheter placement.  Not an active patient of our practice.  Admitted for UTI/code sepsis.  Gross hematuria upon presentation, catheter changed yesterday at his nursing facility.  I agree with CT abdomen and pelvis to assure proper placement of catheter, as well as  to rule out obvious bladder or upper tract abnormality.  At this point, unless significant complications/urologic issues need to be addressed, we will be available for helping this gentleman, but we will see him only otherwise on an as-needed basis.

## 2017-10-06 ENCOUNTER — Encounter (HOSPITAL_COMMUNITY): Payer: Self-pay | Admitting: Family Medicine

## 2017-10-06 ENCOUNTER — Inpatient Hospital Stay (HOSPITAL_COMMUNITY): Payer: Medicare Other

## 2017-10-06 DIAGNOSIS — A419 Sepsis, unspecified organism: Principal | ICD-10-CM

## 2017-10-06 DIAGNOSIS — I361 Nonrheumatic tricuspid (valve) insufficiency: Secondary | ICD-10-CM | POA: Diagnosis not present

## 2017-10-06 DIAGNOSIS — N179 Acute kidney failure, unspecified: Secondary | ICD-10-CM | POA: Diagnosis not present

## 2017-10-06 DIAGNOSIS — G8250 Quadriplegia, unspecified: Secondary | ICD-10-CM | POA: Diagnosis not present

## 2017-10-06 DIAGNOSIS — N319 Neuromuscular dysfunction of bladder, unspecified: Secondary | ICD-10-CM | POA: Diagnosis not present

## 2017-10-06 DIAGNOSIS — R319 Hematuria, unspecified: Secondary | ICD-10-CM | POA: Diagnosis not present

## 2017-10-06 DIAGNOSIS — E871 Hypo-osmolality and hyponatremia: Secondary | ICD-10-CM | POA: Diagnosis not present

## 2017-10-06 DIAGNOSIS — J189 Pneumonia, unspecified organism: Secondary | ICD-10-CM | POA: Diagnosis not present

## 2017-10-06 DIAGNOSIS — I248 Other forms of acute ischemic heart disease: Secondary | ICD-10-CM | POA: Diagnosis not present

## 2017-10-06 DIAGNOSIS — I10 Essential (primary) hypertension: Secondary | ICD-10-CM | POA: Diagnosis not present

## 2017-10-06 DIAGNOSIS — R000 Tachycardia, unspecified: Secondary | ICD-10-CM

## 2017-10-06 DIAGNOSIS — R509 Fever, unspecified: Secondary | ICD-10-CM

## 2017-10-06 DIAGNOSIS — L89324 Pressure ulcer of left buttock, stage 4: Secondary | ICD-10-CM

## 2017-10-06 DIAGNOSIS — N390 Urinary tract infection, site not specified: Secondary | ICD-10-CM | POA: Diagnosis not present

## 2017-10-06 DIAGNOSIS — R310 Gross hematuria: Secondary | ICD-10-CM | POA: Diagnosis not present

## 2017-10-06 DIAGNOSIS — K592 Neurogenic bowel, not elsewhere classified: Secondary | ICD-10-CM | POA: Diagnosis not present

## 2017-10-06 DIAGNOSIS — R4182 Altered mental status, unspecified: Secondary | ICD-10-CM | POA: Diagnosis not present

## 2017-10-06 DIAGNOSIS — F209 Schizophrenia, unspecified: Secondary | ICD-10-CM | POA: Diagnosis not present

## 2017-10-06 LAB — BLOOD CULTURE ID PANEL (REFLEXED)
Acinetobacter baumannii: NOT DETECTED
CANDIDA GLABRATA: NOT DETECTED
CANDIDA KRUSEI: NOT DETECTED
CANDIDA PARAPSILOSIS: NOT DETECTED
CARBAPENEM RESISTANCE: NOT DETECTED
Candida albicans: NOT DETECTED
Candida tropicalis: NOT DETECTED
ESCHERICHIA COLI: NOT DETECTED
Enterobacter cloacae complex: NOT DETECTED
Enterobacteriaceae species: DETECTED — AB
Enterococcus species: NOT DETECTED
Haemophilus influenzae: NOT DETECTED
KLEBSIELLA OXYTOCA: NOT DETECTED
KLEBSIELLA PNEUMONIAE: NOT DETECTED
LISTERIA MONOCYTOGENES: NOT DETECTED
Neisseria meningitidis: NOT DETECTED
Proteus species: DETECTED — AB
Pseudomonas aeruginosa: NOT DETECTED
SERRATIA MARCESCENS: NOT DETECTED
STAPHYLOCOCCUS AUREUS BCID: NOT DETECTED
STAPHYLOCOCCUS SPECIES: NOT DETECTED
STREPTOCOCCUS PNEUMONIAE: NOT DETECTED
STREPTOCOCCUS PYOGENES: NOT DETECTED
Streptococcus agalactiae: NOT DETECTED
Streptococcus species: NOT DETECTED

## 2017-10-06 LAB — CBC
HCT: 27.8 % — ABNORMAL LOW (ref 39.0–52.0)
Hemoglobin: 9 g/dL — ABNORMAL LOW (ref 13.0–17.0)
MCH: 27.4 pg (ref 26.0–34.0)
MCHC: 32.4 g/dL (ref 30.0–36.0)
MCV: 84.8 fL (ref 78.0–100.0)
PLATELETS: 228 10*3/uL (ref 150–400)
RBC: 3.28 MIL/uL — ABNORMAL LOW (ref 4.22–5.81)
RDW: 12.6 % (ref 11.5–15.5)
WBC: 6.5 10*3/uL (ref 4.0–10.5)

## 2017-10-06 LAB — COMPREHENSIVE METABOLIC PANEL
ALBUMIN: 2.3 g/dL — AB (ref 3.5–5.0)
ALT: 27 U/L (ref 17–63)
ANION GAP: 10 (ref 5–15)
AST: 32 U/L (ref 15–41)
Alkaline Phosphatase: 52 U/L (ref 38–126)
BILIRUBIN TOTAL: 1.3 mg/dL — AB (ref 0.3–1.2)
BUN: 26 mg/dL — ABNORMAL HIGH (ref 6–20)
CHLORIDE: 100 mmol/L — AB (ref 101–111)
CO2: 22 mmol/L (ref 22–32)
Calcium: 8.5 mg/dL — ABNORMAL LOW (ref 8.9–10.3)
Creatinine, Ser: 0.84 mg/dL (ref 0.61–1.24)
GFR calc Af Amer: >60 mL/min (ref >60)
GFR calc non Af Amer: >60 mL/min (ref >60)
GLUCOSE: 105 mg/dL — AB (ref 65–99)
POTASSIUM: 3.2 mmol/L — AB (ref 3.5–5.1)
SODIUM: 132 mmol/L — AB (ref 135–145)
Total Protein: 5.5 g/dL — ABNORMAL LOW (ref 6.5–8.1)

## 2017-10-06 LAB — TROPONIN I: TROPONIN I: 1.6 ng/mL — AB (ref <0.03)

## 2017-10-06 LAB — ECHOCARDIOGRAM LIMITED
HEIGHTINCHES: 67 in
WEIGHTICAEL: 2204.6 [oz_av]

## 2017-10-06 LAB — STREP PNEUMONIAE URINARY ANTIGEN: STREP PNEUMO URINARY ANTIGEN: NEGATIVE

## 2017-10-06 LAB — MRSA PCR SCREENING: MRSA by PCR: NEGATIVE

## 2017-10-06 MED ORDER — ACETAMINOPHEN 325 MG PO TABS
650.0000 mg | ORAL_TABLET | Freq: Four times a day (QID) | ORAL | Status: DC | PRN
  Administered 2017-10-06 – 2017-10-09 (×5): 650 mg via ORAL
  Filled 2017-10-06 (×5): qty 2

## 2017-10-06 MED ORDER — ASPIRIN 300 MG RE SUPP: 300.0000 mg | Freq: Every day | RECTAL | Status: DC

## 2017-10-06 MED ORDER — ASPIRIN 325 MG PO TABS
325.0000 mg | ORAL_TABLET | Freq: Every day | ORAL | Status: DC
  Administered 2017-10-06 – 2017-10-10 (×5): 325 mg via ORAL
  Filled 2017-10-06 (×5): qty 1

## 2017-10-06 MED ORDER — POTASSIUM CHLORIDE IN NACL 20-0.9 MEQ/L-% IV SOLN
INTRAVENOUS | Status: AC
  Administered 2017-10-06 – 2017-10-07 (×3): via INTRAVENOUS

## 2017-10-06 MED ORDER — KETOROLAC TROMETHAMINE 15 MG/ML IJ SOLN
15.0000 mg | Freq: Once | INTRAMUSCULAR | Status: AC
  Administered 2017-10-06: 15 mg via INTRAVENOUS
  Filled 2017-10-06: qty 1

## 2017-10-06 MED ORDER — DEXTROSE 5 % IV SOLN
2.0000 g | INTRAVENOUS | Status: AC
  Administered 2017-10-06 – 2017-10-09 (×4): 2 g via INTRAVENOUS
  Filled 2017-10-06 (×5): qty 2

## 2017-10-06 MED ORDER — PNEUMOCOCCAL VAC POLYVALENT 25 MCG/0.5ML IJ INJ: 0.5000 mL | INJECTION | INTRAMUSCULAR | Status: DC

## 2017-10-06 NOTE — Progress Notes (Signed)
PHARMACY - PHYSICIAN COMMUNICATION CRITICAL VALUE ALERT - BLOOD CULTURE IDENTIFICATION (BCID)  Dean Oliver is an 72 y.o. male who presented to [facility] on 10/05/2017 with a chief complaint of fever  Assessment:  Quadraplegic with chronic foley cath presented to ED with Fever and bloody urine after a catheter change.  In addition the CT shows a lung infiltrate. WBC is normal.   Name of physician (or Provider) Contacted: Dr. Wynetta Emery  Current antibiotics: Zosyn  Changes to prescribed antibiotics recommended:  Contacted MD regarding Proteus in [facility] and previous proteus in this pt sensitive to Ceftriaxone. MD transitioned pt to ceftriaxone 2gm IV q24h.  Results for orders placed or performed during the hospital encounter of 10/05/17  Blood Culture ID Panel (Reflexed) (Collected: 10/05/2017  2:52 PM)  Result Value Ref Range   Enterococcus species NOT DETECTED NOT DETECTED   Listeria monocytogenes NOT DETECTED NOT DETECTED   Staphylococcus species NOT DETECTED NOT DETECTED   Staphylococcus aureus NOT DETECTED NOT DETECTED   Streptococcus species NOT DETECTED NOT DETECTED   Streptococcus agalactiae NOT DETECTED NOT DETECTED   Streptococcus pneumoniae NOT DETECTED NOT DETECTED   Streptococcus pyogenes NOT DETECTED NOT DETECTED   Acinetobacter baumannii NOT DETECTED NOT DETECTED   Enterobacteriaceae species DETECTED (A) NOT DETECTED   Enterobacter cloacae complex NOT DETECTED NOT DETECTED   Escherichia coli NOT DETECTED NOT DETECTED   Klebsiella oxytoca NOT DETECTED NOT DETECTED   Klebsiella pneumoniae NOT DETECTED NOT DETECTED   Proteus species DETECTED (A) NOT DETECTED   Serratia marcescens NOT DETECTED NOT DETECTED   Carbapenem resistance NOT DETECTED NOT DETECTED   Haemophilus influenzae NOT DETECTED NOT DETECTED   Neisseria meningitidis NOT DETECTED NOT DETECTED   Pseudomonas aeruginosa NOT DETECTED NOT DETECTED   Candida albicans NOT DETECTED NOT DETECTED   Candida glabrata NOT  DETECTED NOT DETECTED   Candida krusei NOT DETECTED NOT DETECTED   Candida parapsilosis NOT DETECTED NOT DETECTED   Candida tropicalis NOT DETECTED NOT DETECTED    Isac Sarna, BS [place], BCPS Clinical Pharmacist Pager [phone]  10/06/2017  11:05 AM

## 2017-10-06 NOTE — Progress Notes (Signed)
CRITICAL VALUE ALERT  Critical Value:  Positive blood cultures  Date & Time Notied:  10/06/17 0530  Provider Notified: Olevia Bowens  Orders Received/Actions taken: No new orders at this time

## 2017-10-06 NOTE — Progress Notes (Signed)
*  PRELIMINARY RESULTS* Echocardiogram 2D Echocardiogram has been performed.  Dean Oliver 10/06/2017, 11:48 AM

## 2017-10-06 NOTE — NC FL2 (Addendum)
[place] LEVEL OF CARE SCREENING TOOL     IDENTIFICATION  Patient Name: Dean Oliver Birthdate: 1946/06/18 Sex: male Admission Date (Current Location): 10/05/2017  [facility] and [place] Number:  [employer] and Address:  [place] [address], [place]      Provider Number: [id number]  Attending Physician Name and Address:  Murlean Iba, MD  Relative Name and Phone Number:       Current Level of Care: Hospital Recommended Level of Care: [place] Prior Approval Number:    Date Approved/Denied:   PASRR Number:    Discharge Plan: SNF    Current Diagnoses: Patient Active Problem List   Diagnosis Date Noted  . Pressure injury of skin 10/06/2017  . Fever in adult   . Gross hematuria   . Urinary tract infection with hematuria   . Quadriplegia ([place]) 10/05/2017  . Sepsis secondary to UTI ([place]) 10/05/2017  . AKI (acute kidney injury) ([place]) 10/05/2017  . HCAP (healthcare-associated pneumonia) 10/05/2017  . Neurogenic bladder 08/18/2017  . Neurogenic bowel 08/18/2017  . Bacterial UTI 08/18/2017  . Central cord syndrome ([place]) 08/17/2017  . Dysphagia   . Fall   . Hypotension   . Sinus bradycardia   . HNP (herniated nucleus pulposus) with myelopathy, cervical   . Benign essential HTN   . Hyponatremia   . Acute blood loss anemia   . Symptomatic bradycardia 08/12/2017  . Schizophrenia ([place])     Orientation RESPIRATION BLADDER Height & Weight     Self, Time, Situation, Place  Normal Indwelling catheter Weight: 137 lb 12.6 oz (62.5 kg) Height:  5\' 7"  (170.2 cm)  BEHAVIORAL SYMPTOMS/MOOD NEUROLOGICAL BOWEL NUTRITION STATUS      Incontinent Diet(see discharge summary)  AMBULATORY STATUS COMMUNICATION OF NEEDS Skin   Total Care(Patient has quadriplegia) Verbally PU Stage and Appropriate Care(penis)                       Personal Care Assistance Level of Assistance  Total care Bathing  Assistance: Maximum assistance Feeding assistance: Maximum assistance   Total Care Assistance: Maximum assistance   Functional Limitations Info  Sight, Hearing, Speech Sight Info: Adequate Hearing Info: Adequate Speech Info: Adequate    SPECIAL CARE FACTORS FREQUENCY                       Contractures Contractures Info: Not present    Additional Factors Info  Code Status, Psychotropic Code Status Info: Full Code   Psychotropic Info: Xanax, Risperdal         Current Medications (10/06/2017):  This is the current hospital active medication list Current Facility-Administered Medications  Medication Dose Route Frequency Provider Last Rate Last Dose  . 0.9 % NaCl with KCl 20 mEq/ L  infusion   Intravenous Continuous Wynetta Emery, Leighanne Adolph L, MD   Stopped at 10/06/17 1138  . acetaminophen (TYLENOL) tablet 650 mg  650 mg Oral Q6H PRN Reubin Milan, MD   650 mg at 10/06/17 1137  . aspirin suppository 300 mg  300 mg Rectal Daily Najae Filsaime L, MD       Or  . aspirin tablet 325 mg  325 mg Oral Daily Verlean Allport L, MD   325 mg at 10/06/17 0923  . cefTRIAXone (ROCEPHIN) 2 g in dextrose 5 % 50 mL IVPB  2 g Intravenous Q24H Wallace Cogliano L, MD 100 mL/hr at 10/06/17 1243 2  g at 10/06/17 1243  . enoxaparin (LOVENOX) injection 40 mg  40 mg Subcutaneous Q24H Debbe Odea, MD   40 mg at 10/05/17 2205  . [START ON 10/07/2017] pneumococcal 23 valent vaccine (PNU-IMMUNE) injection 0.5 mL  0.5 mL Intramuscular Tomorrow-1000 Lancer Thurner L, MD         Discharge Medications: Please see discharge summary for a list of discharge medications.  Relevant Imaging Results:  Relevant Lab Results:   Additional Information    Settle, Clydene Pugh, LCSW

## 2017-10-06 NOTE — Progress Notes (Signed)
CRITICAL VALUE ALERT  Critical Value:  Aerobic Blood Culture growing gram negative rods  Date & Time Notied:  10/06/17 1337  Provider Notified: Dr. Wynetta Emery  Orders Received/Actions taken: Paged and made aware

## 2017-10-06 NOTE — Clinical Social Work Note (Signed)
Clinical Social Work Assessment  Patient Details  Name: Dean Oliver MRN: [id number] Date of Birth: November 03, 1945  Date of referral:  10/06/17               Reason for consult:  Other (Comment Required)(From [facility] )                Permission sought to share information with:    Permission granted to share information::     Name::        Agency::  [facility] at [facility]  Relationship::     Contact Information:     Housing/Transportation Living arrangements for the past 2 months:  [place] of Information:  Facility Patient Interpreter Needed:  None Criminal Activity/Legal Involvement Pertinent to Current Situation/Hospitalization:  No - Comment as needed Significant Relationships:  Other Family Members Lives with:  Facility Resident Do you feel safe going back to the place where you live?  Yes Need for family participation in patient care:  Yes (Comment)  Care giving concerns:  Patient receives total care at the facility. No concerns identified.    Social Worker assessment / plan:  Patient has been a resident at [facility] since 09/07/17. He came to the facility after being discharged from [facility].  Patient had previously resided in a group home. Patient has quadriplegia. He is total care. Patient has an indwelling catheter. His extended family is supported.  Patient can return to the facility at discharge.   Employment status:  Disabled (Comment on whether or not currently receiving Disability) Insurance information:  Medicare, Medicaid In [place] PT Recommendations:  Not assessed at this time Information / Referral to community resources:     Patient/Family's Response to care:  Patient was asleep.   Patient/Family's Understanding of and Emotional Response to Diagnosis, Current Treatment, and Prognosis:  LCSW will contact patient's guardian to further discuss.   Emotional Assessment Appearance:  Appears stated age Attitude/Demeanor/Rapport:    Affect (typically  observed):  Unable to Assess(Patient was asleep. ) Orientation:  Oriented to Self, Oriented to Place, Oriented to  Time, Oriented to Situation Alcohol / Substance use:  Not Applicable Psych involvement (Current and /or in the community):  No (Comment)  Discharge Needs  Concerns to be addressed:  Other (Comment Required(Return to facility at discharge.) Readmission within the last 30 days:  Yes Current discharge risk:  None Barriers to Discharge:  No Barriers Identified   Ihor Gully, LCSW 10/06/2017, 1:29 PM

## 2017-10-06 NOTE — Evaluation (Signed)
Clinical/Bedside Swallow Evaluation Patient Details  Name: Dean Oliver MRN: [id number] Date of Birth: May 21, 1946  Today's Date: 10/06/2017 Time: SLP Start Time (ACUTE ONLY): 1300 SLP Stop Time (ACUTE ONLY): 1328 SLP Time Calculation (min) (ACUTE ONLY): 28 min  Past Medical History:  Past Medical History:  Diagnosis Date  . Cataracts, bilateral   . HTN (hypertension)   . Quadriplegia ([place])   . Schizophrenia [facility])    Past Surgical History:  Past Surgical History:  Procedure Laterality Date  . None     HPI:  Dean Oliver is a 72 y.o. male with medical history of quadriplegia, schizophrenia chronic foley cath who presents with a fever of 102 and bloody urine after a catheter change today. At baseline, he is alert and communicative but his cousins who are giving me a history state that he has been lethargic. Of note, CT scan suggests a pneumonia. His cousin noted him coughing today. He was also in close proximity and was drinking sharing utensils with a person who has been diagnosed with a pneumonia and has been hospitalized.    Assessment / Plan / Recommendation Clinical Impression  Clinical swallow evaluation completed at bedside. Pt reports occasional coughing when eating/drinking. He tells SLP, "I don't have pneumonia". Pt edentulous but states he consumes most textures. He required significant encouragement and repeated attempts to raise HOB to safe positioning for po intake.  Pt without overt signs/symptoms of aspiration at bedside, howeve he is impulsive with liquid intake (straw sips). Pt is at increased risk for aspiration given bedbound and dependent feeder status. Recommend D3/mechanical soft diet with thin liquids with 100% feeder assist and HOB elevated to at least 50 degrees. Pt appreciative of recommendations. PO meds whole with water when alert and upright. SLP will sign off. Reconsult if needed.  SLP Visit Diagnosis: Dysphagia, unspecified (R13.10)    Aspiration Risk  Mild aspiration risk    Diet Recommendation Dysphagia 3 (Mech soft);Thin liquid   Liquid Administration via: Straw Medication Administration: Whole meds with liquid Supervision: Staff to assist with self feeding;Full supervision/cueing for compensatory strategies Compensations: Slow rate Postural Changes: Remain upright for at least 30 minutes after po intake;Seated upright at 90 degrees    Other  Recommendations Oral Care Recommendations: Oral care BID;Staff/trained caregiver to provide oral care Other Recommendations: Clarify dietary restrictions   Follow up Recommendations 24 hour supervision/assistance      Frequency and Duration            Prognosis Prognosis for Safe Diet Advancement: Good      Swallow Study   General Date of Onset: 10/05/17 HPI: Dean Oliver is a 72 y.o. male with medical history of quadriplegia, schizophrenia chronic foley cath who presents with a fever of 102 and bloody urine after a catheter change today. At baseline, he is alert and communicative but his cousins who are giving me a history state that he has been lethargic. Of note, CT scan suggests a pneumonia. His cousin noted him coughing today. He was also in close proximity and was drinking sharing utensils with a person who has been diagnosed with a pneumonia and has been hospitalized.  Type of Study: Bedside Swallow Evaluation Previous Swallow Assessment: November BSE at [facility] Diet Prior to this Study: NPO Temperature Spikes Noted: No Respiratory Status: Room air History of Recent Intubation: No Behavior/Cognition: Alert;Cooperative;Pleasant mood Oral Cavity Assessment: Within Functional Limits Oral Care Completed by SLP: Yes Oral Cavity - Dentition: Edentulous Vision: Impaired for self-feeding Self-Feeding Abilities:  Total assist Patient Positioning: Upright in bed(Pt required encouragement to elevate HOB to 50 degrees) Baseline Vocal Quality: Normal Volitional Cough: Strong Volitional  Swallow: Able to elicit    Oral/Motor/Sensory Function Overall Oral Motor/Sensory Function: Within functional limits   Ice Chips Ice chips: Within functional limits Presentation: Spoon   Thin Liquid Thin Liquid: Within functional limits Presentation: Straw    Nectar Thick Nectar Thick Liquid: Not tested   Honey Thick Honey Thick Liquid: Not tested   Puree Puree: Within functional limits Presentation: Spoon   Solid   Thank you,  Genene Churn, CCC-SLP [phone]    Solid: Within functional limits        Anabelle Bungert 10/06/2017,1:35 PM

## 2017-10-06 NOTE — Progress Notes (Signed)
Darius Bump, RN notified of blood culture results at this time.

## 2017-10-06 NOTE — Consult Note (Signed)
Cardiology Consult    Patient ID: Dean Oliver; [id number]; 05-12-1946   Admit date: 10/05/2017 Date of Consult: 10/06/2017  Primary Care Provider: Patient, No Pcp Per Primary Cardiologist: Dr. Harrington Challenger (Inpatient Evaluation in 07/2017)  Patient Profile    Dean Oliver is a 72 y.o. male with past medical history of Quadriplegia, HTN and Schizophrenia who is being seen today for the evaluation of elevated troponin at the request of Dr. Wynetta Emery.   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 Cardiology followed and there was no indication of PPM placement at that time. An echocardiogram was performed and showed a preserved EF of 55-60% with no regional WMA. He was admitted to [facility] for therapy secondary to his decreased functional capacity and quadriplegia, discharged to SNF on 09/07/2017.  He presented to [facility] ED on 10/05/2016 for evaluation of a fever and gross hematuria. Initial labs showed WBC 6.5, Hgb 11.8, and platelets 300. Na+ 123, K+ 5.0, and creatinine 1.66 (previusly 0.68 in 08/2017). Lactic Acid 2.09. Initial troponin 0.10. EKG shows sinus tachycardia, HR 123.  He has been admitted with severe sepsis secondary to UTI and HCAP. He has been started on Vancomycin and Zosyn. Blood cultures were drawn on admission and are positive (showing gram negative rods and gram positive diplococci.  CXR with no acute cardiopulmonary abnormalities. CT Head with no acute findings.   In talking with the patient today, he reports feeling better than he was prior to admission. Denies any chest pain or recent dyspnea. Has noticed palpitations at times. No recent orthopnea, PND, or lower extremity edema. Spends most of his day in bed secondary to quadriplegia. Does sit in a chair at his SNF by his report.   Past Medical History:  Diagnosis Date  .  Cataracts, bilateral   . HTN (hypertension)   . Quadriplegia ([place])   . Schizophrenia [facility])     Past Surgical History:  Procedure Laterality Date  . None       Home Medications:  Prior to Admission medications   Medication Sig Start Date End Date Taking? Authorizing Provider  ALPRAZolam (XANAX) 0.25 MG tablet Take 1 tablet (0.25 mg total) by mouth 2 (two) times daily as needed for anxiety. 09/07/17  Yes Angiulli, Lavon Paganini, PA-C  aspirin EC 81 MG EC tablet Take 1 tablet (81 mg total) by mouth daily. 08/18/17  Yes Allie Bossier, MD  Baclofen 5 MG TABS Take 5 mg by mouth 2 (two) times daily. 09/07/17  Yes Angiulli, Lavon Paganini, PA-C  clotrimazole (LOTRIMIN) 1 % cream Apply 1 application topically 2 (two) times daily. 14  Day course to left cheek for ring worm starting on 09/28/2017   Yes [provider]  ferrous sulfate 325 (65 FE) MG tablet Take 325 mg by mouth daily with breakfast.   Yes [provider]  HYDROcodone-acetaminophen (NORCO/VICODIN) 5-325 MG tablet Take 1-2 tablets by mouth every 6 (six) hours as needed for moderate pain. 09/07/17  Yes Angiulli, Lavon Paganini, PA-C  Multiple Vitamins-Minerals (CERTAVITE SENIOR/ANTIOXIDANT) TABS Take 1 tablet by mouth daily.   Yes [provider]  risperiDONE (RISPERDAL) 0.5 MG tablet Take 1 tablet (0.5 mg total) by mouth 2 (two) times daily. 09/07/17  Yes Angiulli, Lavon Paganini, PA-C  senna-docusate (SENOKOT-S) 8.6-50 MG tablet Take 2 tablets by mouth at bedtime. Patient taking differently:  Take 2 tablets by mouth 2 (two) times daily.  09/07/17  Yes Angiulli, Lavon Paganini, PA-C  sodium phosphate (FLEET) 7-19 GM/118ML ENEM Place 133 mLs (1 enema total) rectally daily as needed for severe constipation. 09/07/17  Yes Angiulli, Lavon Paganini, PA-C  tiZANidine (ZANAFLEX) 4 MG tablet Take 1 tablet (4 mg total) by mouth every 6 (six) hours as needed for muscle spasms. 09/07/17  Yes Angiulli, Lavon Paganini, PA-C  vitamin C (ASCORBIC ACID) 500 MG tablet  Take 500 mg by mouth daily.   Yes [provider]  sulfamethoxazole-trimethoprim (BACTRIM DS,SEPTRA DS) 800-160 MG tablet Take 1 tablet by mouth every 12 (twelve) hours. Patient not taking: Reported on 10/05/2017 09/07/17   Cathlyn Parsons, PA-C    Inpatient Medications: Scheduled Meds: . aspirin  300 mg Rectal Daily   Or  . aspirin  325 mg Oral Daily  . enoxaparin (LOVENOX) injection  40 mg Subcutaneous Q24H  . [START ON 10/07/2017] pneumococcal 23 valent vaccine  0.5 mL Intramuscular Tomorrow-1000   Continuous Infusions: . sodium chloride 140 mL/hr at 10/06/17 0927  . piperacillin-tazobactam (ZOSYN)  IV 3.375 g (10/06/17 0606)  . vancomycin     PRN Meds: acetaminophen  Allergies:   No Known Allergies  Social History:   Social History   Socioeconomic History  . Marital status: Single    Spouse name: Not on file  . Number of children: Not on file  . Years of education: Not on file  . Highest education level: Not on file  Social Needs  . Financial resource strain: Not on file  . Food insecurity - worry: Not on file  . Food insecurity - inability: Not on file  . Transportation needs - medical: Not on file  . Transportation needs - non-medical: Not on file  Occupational History  . Occupation: Disabled  Tobacco Use  . Smoking status: Never Smoker  . Smokeless tobacco: Never Used  Substance and Sexual Activity  . Alcohol use: No    Frequency: Never  . Drug use: No  . Sexual activity: Not Currently  Other Topics Concern  . Not on file  Social History Narrative   Dean Oliver (uncle) is his POA.    Pt lives in [facility], [phone].     Family History:    Family History  Family history unknown: Yes    Patient reports no significant family history. No other family members at the bedside during this encounter.   Review of Systems    General:  No chills or night sweats or weight changes. Positive for fever.  Cardiovascular:   No chest pain, dyspnea on exertion, edema, orthopnea, palpitations, paroxysmal nocturnal dyspnea. Dermatological: No rash, lesions/masses Respiratory: No cough, dyspnea Urologic: No dysuria. Positive for hematuria.  Abdominal:   No nausea, vomiting, diarrhea, bright red blood per rectum, melena, or hematemesis Neurologic:  No visual changes, wkns, changes in mental status. All other systems reviewed and are otherwise negative except as noted above.  Physical Exam/Data    Vitals:   10/05/17 2159 10/05/17 2314 10/06/17 0423 10/06/17 0830  BP: 127/69  111/64   Pulse: (!) 125  (!) 133   Resp: 20  20   Temp: 99.6 F (37.6 C)  (!) 101.5 F (38.6 C) 98.7 F (37.1 C)  TempSrc: Oral  Oral Oral  SpO2: 93%  96%   Weight: 137 lb 12.6 oz (62.5 kg)     Height:  5\' 7"  (1.702 m)  Intake/Output Summary (Last 24 hours) at 10/06/2017 0936 Last data filed at 10/06/2017 0927 Gross per 24 hour  Intake 7328.75 ml  Output 2250 ml  Net 5078.75 ml   Filed Weights   10/05/17 1439 10/05/17 2159  Weight: 143 lb 3 oz (64.9 kg) 137 lb 12.6 oz (62.5 kg)   Body mass index is 21.58 kg/m.   General: Pleasant elderly African American male appearing in NAD Psych: Normal affect. Neuro: Alert and oriented X 3. Unable to move extremities.  HEENT: Normal  Neck: Supple without bruits or JVD. Lungs:  Resp regular and unlabored, CTA without wheezing or rales when listening anteriorly. Heart: Regular rhythm, tachycardiac rate. No s3, s4, or murmurs. Abdomen: Soft, non-tender, non-distended, BS + x 4.  Extremities: No clubbing, cyanosis or edema. DP/PT/Radials 2+ and equal bilaterally.   EKG:  The EKG was personally reviewed and demonstrates: Sinus tachycardia, HR 123. Telemetry:  Telemetry was personally reviewed and demonstrates: Sinus tachycardia, HR in 110's to 120's.    Labs/Studies     Relevant CV Studies:  Echocardiogram: 08/13/2017 Study Conclusions  - Left ventricle: The cavity size was  normal. Wall thickness was at   the upper limits of normal. Systolic function was normal. The   estimated ejection fraction was in the range of 55% to 60%. Wall   motion was normal; there were no regional wall motion   abnormalities. Left ventricular diastolic function parameters   were normal. - Aortic valve: There was trivial regurgitation. - Pulmonary arteries: PA peak pressure: 38 mm Hg (S).  Laboratory Data:  Chemistry Recent Labs  Lab 10/05/17 1444 10/06/17 0825  NA 123* 132*  K 5.0 3.2*  CL 89* 100*  CO2 20* 22  GLUCOSE 170* 105*  BUN 43* 26*  CREATININE 1.66* 0.84  CALCIUM 9.2 8.5*  GFRNONAA 40* >60  GFRAA 47* >60  ANIONGAP 14 10    Recent Labs  Lab 10/05/17 1444 10/06/17 0825  PROT 6.9 5.5*  ALBUMIN 2.8* 2.3*  AST 34 32  ALT 24 27  ALKPHOS 55 52  BILITOT 1.5* 1.3*   Hematology Recent Labs  Lab 10/05/17 1444  WBC 6.5  RBC 4.38  HGB 11.8*  HCT 36.1*  MCV 82.4  MCH 26.9  MCHC 32.7  RDW 12.4  PLT 300   Cardiac Enzymes Recent Labs  Lab 10/05/17 1444  TROPONINI 0.10*   No results for input(s): TROPIPOC in the last 168 hours.  BNPNo results for input(s): BNP, PROBNP in the last 168 hours.  DDimer No results for input(s): DDIMER in the last 168 hours.  Radiology/Studies:  Ct Head Wo Contrast  Result Date: 10/05/2017 CLINICAL DATA:  Quadriplegia.  Fever.  Lethargy.  Ataxia. EXAM: CT HEAD WITHOUT CONTRAST TECHNIQUE: Contiguous axial images were obtained from the base of the skull through the vertex without intravenous contrast. COMPARISON:  08/13/2017 head CT. FINDINGS: Brain: No evidence of parenchymal hemorrhage or extra-axial fluid collection. No mass lesion, mass effect, or midline shift. No CT evidence of acute infarction. Mild generalized cerebral volume loss. No ventriculomegaly. Vascular: No acute abnormality. Skull: No evidence of calvarial fracture. Sinuses/Orbits: The visualized paranasal sinuses are essentially clear. Other:  The mastoid air  cells are unopacified. IMPRESSION: 1.  No evidence of acute intracranial abnormality. 2. Mild generalized cerebral volume loss. Electronically Signed   By: Ilona Sorrel M.D.   On: 10/05/2017 20:48   Dg Chest Port 1 View  Result Date: 10/05/2017 CLINICAL DATA:  72 year old male code sepsis.  Fever  and hematuria. EXAM: PORTABLE CHEST 1 VIEW COMPARISON:  08/12/2017 portable chest. FINDINGS: Portable AP upright view at 1557 hrs. Similar low lung volumes. Mediastinal contours are stable and within normal limits. No pneumothorax, pulmonary edema, pleural effusion or consolidation. Mild perihilar crowding of of lung markings with no convincing airspace opacity. Mild gaseous distension of bowel in the visible left abdomen. IMPRESSION: Low lung volumes, otherwise no acute cardiopulmonary abnormality. Electronically Signed   By: Genevie Ann M.D.   On: 10/05/2017 16:15   Ct Renal Stone Study  Result Date: 10/05/2017 CLINICAL DATA:  Gross hematuria.  Fever of unknown origin. EXAM: CT ABDOMEN AND PELVIS WITHOUT CONTRAST TECHNIQUE: Multidetector CT imaging of the abdomen and pelvis was performed following the standard protocol without IV contrast. COMPARISON:  None. FINDINGS: Lower chest: The patient has bilateral patchy lower lobe pulmonary infiltrates as well as a small area of infiltrate in the lingula. No effusions. Coronary artery calcification. Heart size is normal. Hepatobiliary: 17 mm low-density lesion in the posterior aspect of the right lobe of the liver consistent with a cyst. Liver parenchyma is otherwise normal. No biliary ductal dilatation. Pancreas: The pancreas is not well-defined but there is no discrete abnormality. Spleen: Normal. Adrenals/Urinary Tract: The kidneys and adrenal glands appear normal. No hydronephrosis. A Foley catheter is in place. There is diffuse thickening of the bladder wall without a focal mass. Stomach/Bowel: Prominent stool in the rectum. Slight gaseous distention of the colon.  Moderate stool in the descending colon. No free air. No dilated small bowel. Vascular/Lymphatic: No significant vascular findings are present. No enlarged abdominal or pelvic lymph nodes. Reproductive: Prostate is unremarkable. Other: There is a small amount of ascites in the abdomen and pelvis. Anasarca. Musculoskeletal: No acute or significant osseous findings. IMPRESSION: 1. Patchy bilateral lower lobe infiltrates. 2. Diffuse thickening of the bladder wall with Foley catheter in place. No mass or hydronephrosis. 3. Prominent stool in the rectum and descending colon with gaseous distention of the colon. 4. Small amount of ascites.  Anasarca. Electronically Signed   By: Lorriane Shire M.D.   On: 10/05/2017 18:05     Assessment & Plan    1. Elevated Troponin - the patient presented for evaluation of fevers and lethargy, found to have sepsis and bacteremia. In talking with the patient, he denies any recent chest pain or dyspnea. Is not physically active at baseline secondary to quadriplegia.  - Initial troponin 0.10 with EKG showing sinus tachycardia, HR 123. Cyclic troponin values have not been obtained, however, it is unclear as to why an initial troponin was obtained. Repeat value to be drawn this morning. Would anticipate a flat-trend secondary to demand ischemia in the setting of his sepsis, bacteremia, and AKI. If troponin enzymes are found to be significantly elevated, can consider a repeat limited echo to assess LV function but would not pursue ischemic evaluation at this time in the setting of his acute illness and decreased functional capacity.   2. Sinus Tachycardia - HR has been elevated in the low-100's to 120's since admission. No evidence of atrial fibrillation or flutter.  - elevated HR likely secondary to his acute illness. Can use intermittent doses of IV Lopressor if needed to assist with rate-control but would not put on long-standing BB therapy given his history of bradycardia.   3.  HTN - BP has been variable at 79/59 - 127/76 since admission. Improved to 111/64 on most recent check.   4. Sepsis secondary to UTI and HCAP/ Bacteremia -  Lactic Acid 2.09 on admission. Has been started on Vancomycin and Zosyn. Blood cultures were drawn on admission and are positive (showing gram negative rods and gram positive diplococci.   - per admitting team  5. AKI/ Hyponatremia - Na+ 123 on admission with creatinine 1.66 (previously 0.68 in 08/2017).  - receiving IVF. Repeat BMET is pending.    For questions or updates, please contact [place] Please consult [email or web address] for contact info under Cardiology/STEMI.  Signed, Erma Heritage, PA-C 10/06/2017, 9:36 AM Pager: [phone]  The patient was seen and examined, and I agree with the history, physical exam, assessment and plan as documented above, with modifications as noted below. I have also personally reviewed all relevant documentation, old records, labs, and both radiographic and cardiovascular studies. I have also independently interpreted old and new ECG's.  Briefly, this is a 72 year old male who presented to the hospital on October 05, 2016 for fever and gross hematuria.  Creatinine was markedly elevated at 1.66 and had previously been 0.68.  Lactic acid was 2.09.  Initial troponin was 0.1 which is the reason why we were consulted.  Follow-up troponin is elevated at 1.6.  ECG which I personally interpreted demonstrated sinus tachycardia, heart rate 123 bpm.  Chest x-ray showed no pneumothorax, pulmonary edema, pleural effusion, or consolidation.  There were low lung volumes.  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  An echocardiogram was performed at that time and demonstrated normal left ventricular systolic and diastolic function with normal regional wall motion, LVEF 55-60%.  He is currently being treated with  Zosyn and vancomycin.  During the time of my exam, he complains of chest pain.  He denies shortness of breath.  I suspect that his troponin elevation is due to demand ischemia sepsis and sinus tachycardia.  I will obtain a limited echocardiogram to only assess left ventricular function and regional wall motion given the bump in his troponin and complaints of chest pain.  However, I would not pursue an ischemic evaluation at this time.  Once he has been treated for sepsis and if he continues to experience symptoms concerning for a cardiac etiology, and/or if a limited echocardiogram demonstrates a change in left ventricular function and/or regional wall motion, I would advance medical therapy in the outpatient setting and consider an outpatient stress test.  Given his history of bradycardia and current sepsis, I would avoid oral beta-blockers.  He is currently receiving aspirin rectally.  IV Lopressor can be administered as needed.   Kate Sable, MD, [facility]  10/06/2017 10:47 AM

## 2017-10-06 NOTE — Progress Notes (Addendum)
CRITICAL VALUE ALERT  Critical Value:  Troponin 1.60  Date & Time Notied:  10/05/17 3888  Provider Notified: Spoke with Dr. Jacinta Shoe  Orders Received/Actions taken:

## 2017-10-06 NOTE — Progress Notes (Signed)
PROGRESS NOTE    Dean Oliver  ZJQ:[id number]  DOB: [birth year]  DOA: 10/05/2017 PCP: Patient, No Pcp Per   Brief Admission Hx: Dean Oliver is a 72 y.o. male with medical history of quadriplegia, schizophrenia chronic foley cath who presents with a fever of 102 and bloody urine after a catheter change today. At baseline, he is alert and communicative but his cousins who are giving me a history state that he has been lethargic. Of note, CT scan suggests a pneumonia. His cousin noted him coughing today. He was also in close proximity and was drinking sharing utensils with a person who has been diagnosed with a pneumonia and has been hospitalized.  In the ER, the cousins also notice a left facial droop which they have not noticed before.  MDM/Assessment & Plan:   1. Severe sepsis - continue IV antibiotics and supportive care, lactate trending down, de-escalate antibiotics as clinically improving.   2. Sinus tachycardia - secondary to severe sepsis, treating as above. Follow.  3. Elevated troponin - secondary to demand ischemia, following.  4. UTI - continue IV ceftriaxone 2 gm every 24 hours.  5. Bacteremia - Proteus species reported, continue IV ceftriaxone, DC zosyn.  6. Left facial droop - stroke work up in progress, MRI negative for acute findings.   7. Hematuria - likely from recent catheter replacement, following.  8. Schizophrenia - resuming home medications, following.  9. Quadriplegia with neurogenic bladder - continue chronic foley, baclofen and tizanidine. 10. Anxiety / Depression - continue xanax/resperdone.  11. Constipation - continue laxatives. 1 BM reported in last 8 hours.   DVT prophylaxis: lovenox Code Status: Full  Family Communication: none Disposition Plan: TBD  Subjective: Pt asking to have catheter removed.    Objective: Vitals:   10/05/17 2159 10/05/17 2314 10/06/17 0423 10/06/17 0830  BP: 127/69  111/64   Pulse: (!) 125  (!) 133   Resp: 20  20     Temp: 99.6 F (37.6 C)  (!) 101.5 F (38.6 C) 98.7 F (37.1 C)  TempSrc: Oral  Oral Oral  SpO2: 93%  96%   Weight: 62.5 kg (137 lb 12.6 oz)     Height:  5\' 7"  (1.702 m)      Intake/Output Summary (Last 24 hours) at 10/06/2017 1204 Last data filed at 10/06/2017 0934 Gross per 24 hour  Intake 7345.08 ml  Output 2250 ml  Net 5095.08 ml   Filed Weights   10/05/17 1439 10/05/17 2159  Weight: 64.9 kg (143 lb 3 oz) 62.5 kg (137 lb 12.6 oz)     REVIEW OF SYSTEMS  As per history otherwise all reviewed and reported negative  Exam:  General exam: awake, alert, NAD. Cooperative.  Respiratory system: No increased work of breathing. Cardiovascular system: S1 & S2 heard, tachycardic. No JVD, murmurs, gallops, clicks or pedal edema. Gastrointestinal system: Abdomen is nondistended, soft and nontender. Normal bowel sounds heard. Central nervous system: Alert and oriented. Quadriplegic.  Extremities: no CCE.  Data Reviewed: Basic Metabolic Panel: Recent Labs  Lab 10/05/17 1444 10/06/17 0825  NA 123* 132*  K 5.0 3.2*  CL 89* 100*  CO2 20* 22  GLUCOSE 170* 105*  BUN 43* 26*  CREATININE 1.66* 0.84  CALCIUM 9.2 8.5*   Liver Function Tests: Recent Labs  Lab 10/05/17 1444 10/06/17 0825  AST 34 32  ALT 24 27  ALKPHOS 55 52  BILITOT 1.5* 1.3*  PROT 6.9 5.5*  ALBUMIN 2.8* 2.3*   No results  for input(s): LIPASE, AMYLASE in the last 168 hours. No results for input(s): AMMONIA in the last 168 hours. CBC: Recent Labs  Lab 10/05/17 1444  WBC 6.5  NEUTROABS 4.9  HGB 11.8*  HCT 36.1*  MCV 82.4  PLT 300   Cardiac Enzymes: Recent Labs  Lab 10/05/17 1444 10/06/17 0858  TROPONINI 0.10* 1.60*   CBG (last 3)  No results for input(s): GLUCAP in the last 72 hours. Recent Results (from the past 240 hour(s))  Culture, blood (Routine x 2)     Status: None (Preliminary result)   Collection Time: 10/05/17  2:44 PM  Result Value Ref Range Status   Specimen Description BLOOD  RIGHT FOREARM  Final   Special Requests   Final    BOTTLES DRAWN AEROBIC AND ANAEROBIC Blood Culture adequate volume   Culture  Setup Time   Final    GRAM NEGATIVE RODS ANAEROBIC BOTTLE ONLY GRAM POSITIVE DIPLOCOCCI ANAEROBIC BOTTLE ONLY Gram Stain Report Called to,Read Back By and Verified With: LIGHTNER,N @ 0530 ON 1.17.19 BY BOWMAN,L    Culture NO GROWTH < 24 HOURS  Final   Report Status PENDING  Incomplete  Culture, blood (Routine x 2)     Status: None (Preliminary result)   Collection Time: 10/05/17  2:52 PM  Result Value Ref Range Status   Specimen Description LEFT ANTECUBITAL  Final   Special Requests   Final    BOTTLES DRAWN AEROBIC AND ANAEROBIC Blood Culture results may not be optimal due to an inadequate volume of blood received in culture bottles   Culture  Setup Time   Final    GRAM NEGATIVE RODS ANAEROBIC BOTTLE ONLY GRAM POSITIVE DIPLOCOCCI ANAEROBIC BOTTLE ONLY Gram Stain Report Called to,Read Back By and Verified With: LIGHTNER,N @ 0530 ON 1.17.19 BY BOWMAN,L CRITICAL RESULT CALLED TO, READ BACK BY AND VERIFIED WITH: Jamas Lav [id number] Performed at [facility], [place] [address] [place], [place] [address]    Culture GRAM NEGATIVE RODS  Final   Report Status PENDING  Incomplete  Blood Culture ID Panel (Reflexed)     Status: Abnormal   Collection Time: 10/05/17  2:52 PM  Result Value Ref Range Status   Enterococcus species NOT DETECTED NOT DETECTED Final   Listeria monocytogenes NOT DETECTED NOT DETECTED Final   Staphylococcus species NOT DETECTED NOT DETECTED Final   Staphylococcus aureus NOT DETECTED NOT DETECTED Final   Streptococcus species NOT DETECTED NOT DETECTED Final   Streptococcus agalactiae NOT DETECTED NOT DETECTED Final   Streptococcus pneumoniae NOT DETECTED NOT DETECTED Final   Streptococcus pyogenes NOT DETECTED NOT DETECTED Final   Acinetobacter baumannii NOT DETECTED NOT DETECTED Final   Enterobacteriaceae species DETECTED  (A) NOT DETECTED Final    Comment: Enterobacteriaceae represent a large family of gram-negative bacteria, not a single organism. CRITICAL RESULT CALLED TO, READ BACK BY AND VERIFIED WITH: LIrma Newness.D. 11:00 10/06/17 ([email or web address])    Enterobacter cloacae complex NOT DETECTED NOT DETECTED Final   Escherichia coli NOT DETECTED NOT DETECTED Final   Klebsiella oxytoca NOT DETECTED NOT DETECTED Final   Klebsiella pneumoniae NOT DETECTED NOT DETECTED Final   Proteus species DETECTED (A) NOT DETECTED Final    Comment: CRITICAL RESULT CALLED TO, READ BACK BY AND VERIFIED WITH: LIrma Newness.D. 11:00 10/06/17 ([email or web address])    Serratia marcescens NOT DETECTED NOT DETECTED Final   Carbapenem resistance NOT DETECTED NOT DETECTED Final   Haemophilus influenzae NOT DETECTED NOT DETECTED Final  Neisseria meningitidis NOT DETECTED NOT DETECTED Final   Pseudomonas aeruginosa NOT DETECTED NOT DETECTED Final   Candida albicans NOT DETECTED NOT DETECTED Final   Candida glabrata NOT DETECTED NOT DETECTED Final   Candida krusei NOT DETECTED NOT DETECTED Final   Candida parapsilosis NOT DETECTED NOT DETECTED Final   Candida tropicalis NOT DETECTED NOT DETECTED Final  MRSA PCR Screening     Status: None   Collection Time: 10/05/17 10:51 PM  Result Value Ref Range Status   MRSA by PCR NEGATIVE NEGATIVE Final    Comment:        The GeneXpert MRSA Assay (FDA approved for NASAL specimens only), is one component of a comprehensive MRSA colonization surveillance program. It is not intended to diagnose MRSA infection nor to guide or monitor treatment for MRSA infections.      Studies: Ct Head Wo Contrast  Result Date: 10/05/2017 CLINICAL DATA:  Quadriplegia.  Fever.  Lethargy.  Ataxia. EXAM: CT HEAD WITHOUT CONTRAST TECHNIQUE: Contiguous axial images were obtained from the base of the skull through the vertex without intravenous contrast. COMPARISON:  08/13/2017 head CT. FINDINGS: Brain: No evidence of  parenchymal hemorrhage or extra-axial fluid collection. No mass lesion, mass effect, or midline shift. No CT evidence of acute infarction. Mild generalized cerebral volume loss. No ventriculomegaly. Vascular: No acute abnormality. Skull: No evidence of calvarial fracture. Sinuses/Orbits: The visualized paranasal sinuses are essentially clear. Other:  The mastoid air cells are unopacified. IMPRESSION: 1.  No evidence of acute intracranial abnormality. 2. Mild generalized cerebral volume loss. Electronically Signed   By: Ilona Sorrel M.D.   On: 10/05/2017 20:48   Mr Brain Wo Contrast  Result Date: 10/06/2017 CLINICAL DATA:  Quadriplegic patient with altered mental status over the last 2 days. EXAM: MRI HEAD WITHOUT CONTRAST TECHNIQUE: Multiplanar, multiecho pulse sequences of the brain and surrounding structures were obtained without intravenous contrast. COMPARISON:  CT 10/05/2017.  MRI 08/13/2017. FINDINGS: Brain: Diffusion imaging does not show any acute or subacute infarction. The brainstem and cerebellum are normal. Cerebral hemispheres are normal except for a tiny cyst or old lacunar infarction in the left basal ganglia. No evidence of widespread ischemic changes. No large vessel insult. No mass lesion, hemorrhage, hydrocephalus or extra-axial collection. Vascular: Major vessels at the base of the brain show flow. Skull and upper cervical spine: Negative except for possible myelomalacia at the C2-3 cord. Sinuses/Orbits: Clear/normal Other: None IMPRESSION: No acute intracranial finding. Normal except for a tiny cyst or lacunar infarction in the left basal ganglia. Electronically Signed   By: Nelson Chimes M.D.   On: 10/06/2017 10:47   Dg Chest Port 1 View  Result Date: 10/05/2017 CLINICAL DATA:  72 year old male code sepsis.  Fever and hematuria. EXAM: PORTABLE CHEST 1 VIEW COMPARISON:  08/12/2017 portable chest. FINDINGS: Portable AP upright view at 1557 hrs. Similar low lung volumes. Mediastinal  contours are stable and within normal limits. No pneumothorax, pulmonary edema, pleural effusion or consolidation. Mild perihilar crowding of of lung markings with no convincing airspace opacity. Mild gaseous distension of bowel in the visible left abdomen. IMPRESSION: Low lung volumes, otherwise no acute cardiopulmonary abnormality. Electronically Signed   By: Genevie Ann M.D.   On: 10/05/2017 16:15   Ct Renal Stone Study  Result Date: 10/05/2017 CLINICAL DATA:  Gross hematuria.  Fever of unknown origin. EXAM: CT ABDOMEN AND PELVIS WITHOUT CONTRAST TECHNIQUE: Multidetector CT imaging of the abdomen and pelvis was performed following the standard protocol without IV contrast. COMPARISON:  None. FINDINGS: Lower chest: The patient has bilateral patchy lower lobe pulmonary infiltrates as well as a small area of infiltrate in the lingula. No effusions. Coronary artery calcification. Heart size is normal. Hepatobiliary: 17 mm low-density lesion in the posterior aspect of the right lobe of the liver consistent with a cyst. Liver parenchyma is otherwise normal. No biliary ductal dilatation. Pancreas: The pancreas is not well-defined but there is no discrete abnormality. Spleen: Normal. Adrenals/Urinary Tract: The kidneys and adrenal glands appear normal. No hydronephrosis. A Foley catheter is in place. There is diffuse thickening of the bladder wall without a focal mass. Stomach/Bowel: Prominent stool in the rectum. Slight gaseous distention of the colon. Moderate stool in the descending colon. No free air. No dilated small bowel. Vascular/Lymphatic: No significant vascular findings are present. No enlarged abdominal or pelvic lymph nodes. Reproductive: Prostate is unremarkable. Other: There is a small amount of ascites in the abdomen and pelvis. Anasarca. Musculoskeletal: No acute or significant osseous findings. IMPRESSION: 1. Patchy bilateral lower lobe infiltrates. 2. Diffuse thickening of the bladder wall with Foley  catheter in place. No mass or hydronephrosis. 3. Prominent stool in the rectum and descending colon with gaseous distention of the colon. 4. Small amount of ascites.  Anasarca. Electronically Signed   By: Lorriane Shire M.D.   On: 10/05/2017 18:05   Scheduled Meds: . aspirin  300 mg Rectal Daily   Or  . aspirin  325 mg Oral Daily  . enoxaparin (LOVENOX) injection  40 mg Subcutaneous Q24H  . [START ON 10/07/2017] pneumococcal 23 valent vaccine  0.5 mL Intramuscular Tomorrow-1000   Continuous Infusions: . 0.9 % NaCl with KCl 20 mEq / L Stopped (10/06/17 1138)  . cefTRIAXone (ROCEPHIN)  IV    . vancomycin 1,000 mg (10/06/17 1137)    Principal Problem:   Sepsis secondary to UTI ([place]) Active Problems:   Schizophrenia ([place])   Hyponatremia   Neurogenic bladder   Neurogenic bowel   Quadriplegia (H[facility])   AKI (acute kidney injury) ([place])   HCAP (healthcare-associated pneumonia)   Pressure injury of skin  Time spent:   Irwin Brakeman, MD, FAAFP [facility] Pager [phone]  If 7PM-7AM, please contact night-coverage [email or web address] Password [email or web address] 10/06/2017, 12:04 PM    LOS: 1 day

## 2017-10-07 DIAGNOSIS — R509 Fever, unspecified: Secondary | ICD-10-CM | POA: Diagnosis not present

## 2017-10-07 DIAGNOSIS — R319 Hematuria, unspecified: Secondary | ICD-10-CM | POA: Diagnosis not present

## 2017-10-07 DIAGNOSIS — E871 Hypo-osmolality and hyponatremia: Secondary | ICD-10-CM | POA: Diagnosis not present

## 2017-10-07 DIAGNOSIS — A419 Sepsis, unspecified organism: Secondary | ICD-10-CM | POA: Diagnosis not present

## 2017-10-07 DIAGNOSIS — K592 Neurogenic bowel, not elsewhere classified: Secondary | ICD-10-CM | POA: Diagnosis not present

## 2017-10-07 DIAGNOSIS — F209 Schizophrenia, unspecified: Secondary | ICD-10-CM | POA: Diagnosis not present

## 2017-10-07 DIAGNOSIS — J189 Pneumonia, unspecified organism: Secondary | ICD-10-CM | POA: Diagnosis not present

## 2017-10-07 DIAGNOSIS — R310 Gross hematuria: Secondary | ICD-10-CM | POA: Diagnosis not present

## 2017-10-07 DIAGNOSIS — N179 Acute kidney failure, unspecified: Secondary | ICD-10-CM | POA: Diagnosis not present

## 2017-10-07 DIAGNOSIS — N319 Neuromuscular dysfunction of bladder, unspecified: Secondary | ICD-10-CM | POA: Diagnosis not present

## 2017-10-07 DIAGNOSIS — G8250 Quadriplegia, unspecified: Secondary | ICD-10-CM | POA: Diagnosis not present

## 2017-10-07 DIAGNOSIS — N390 Urinary tract infection, site not specified: Secondary | ICD-10-CM | POA: Diagnosis not present

## 2017-10-07 LAB — CBC WITH DIFFERENTIAL/PLATELET
Basophils Absolute: 0 10*3/uL (ref 0.0–0.1)
Basophils Relative: 0 %
EOS ABS: 0 10*3/uL (ref 0.0–0.7)
EOS PCT: 0 %
HCT: 26.6 % — ABNORMAL LOW (ref 39.0–52.0)
HEMOGLOBIN: 8.6 g/dL — AB (ref 13.0–17.0)
LYMPHS ABS: 0.4 10*3/uL — AB (ref 0.7–4.0)
Lymphocytes Relative: 5 %
MCH: 27.1 pg (ref 26.0–34.0)
MCHC: 32.3 g/dL (ref 30.0–36.0)
MCV: 83.9 fL (ref 78.0–100.0)
MONOS PCT: 6 %
Monocytes Absolute: 0.5 10*3/uL (ref 0.1–1.0)
NEUTROS PCT: 89 %
Neutro Abs: 7.1 10*3/uL (ref 1.7–7.7)
Platelets: 241 10*3/uL (ref 150–400)
RBC: 3.17 MIL/uL — ABNORMAL LOW (ref 4.22–5.81)
RDW: 12.6 % (ref 11.5–15.5)
WBC: 8 10*3/uL (ref 4.0–10.5)

## 2017-10-07 LAB — COMPREHENSIVE METABOLIC PANEL
ALK PHOS: 60 U/L (ref 38–126)
ALT: 33 U/L (ref 17–63)
ANION GAP: 8 (ref 5–15)
AST: 45 U/L — ABNORMAL HIGH (ref 15–41)
Albumin: 2.3 g/dL — ABNORMAL LOW (ref 3.5–5.0)
BUN: 23 mg/dL — ABNORMAL HIGH (ref 6–20)
CALCIUM: 8.1 mg/dL — AB (ref 8.9–10.3)
CO2: 19 mmol/L — AB (ref 22–32)
Chloride: 103 mmol/L (ref 101–111)
Creatinine, Ser: 0.63 mg/dL (ref 0.61–1.24)
Glucose, Bld: 109 mg/dL — ABNORMAL HIGH (ref 65–99)
Potassium: 3.8 mmol/L (ref 3.5–5.1)
SODIUM: 130 mmol/L — AB (ref 135–145)
TOTAL PROTEIN: 5.7 g/dL — AB (ref 6.5–8.1)
Total Bilirubin: 1 mg/dL (ref 0.3–1.2)

## 2017-10-07 LAB — HIV ANTIBODY (ROUTINE TESTING W REFLEX): HIV Screen 4th Generation wRfx: NONREACTIVE

## 2017-10-07 MED ORDER — POTASSIUM CHLORIDE IN NACL 20-0.9 MEQ/L-% IV SOLN
INTRAVENOUS | Status: AC
  Administered 2017-10-07 – 2017-10-08 (×4): via INTRAVENOUS

## 2017-10-07 MED ORDER — ENSURE ENLIVE PO LIQD
237.0000 mL | Freq: Two times a day (BID) | ORAL | Status: DC
  Administered 2017-10-07 – 2017-10-10 (×6): 237 mL via ORAL

## 2017-10-07 NOTE — Progress Notes (Signed)
PROGRESS NOTE    Dean Oliver  YHC:[id number]  DOB: [birth year]  DOA: 10/05/2017 PCP: Patient, No Pcp Per   Brief Admission Hx: Dean Oliver is a 72 y.o. male with medical history of quadriplegia, schizophrenia chronic foley cath who presents with a fever of 102 and bloody urine after a catheter change today. At baseline, Dean is alert and communicative but his cousins who are giving me a history state that Dean has been lethargic. Of note, CT scan suggests a pneumonia. His cousin noted him coughing today. Dean was also in close proximity and was drinking sharing utensils with a person who has been diagnosed with a pneumonia and has been hospitalized.  In the ER, the cousins also notice a left facial droop which they have not noticed before.  MDM/Assessment & Plan:   1. Severe sepsis - continue IV antibiotics and supportive care, lactate trending down, de-escalate antibiotics as clinically improving.   2. Sinus tachycardia - secondary to severe sepsis, treating as above. Follow. Slowly improving with treatment.  3. Elevated troponin - secondary to demand ischemia, following.  4. UTI - continue IV ceftriaxone 2 gm every 24 hours.  5. Bacteremia - Proteus species reported, continue IV ceftriaxone, DC zosyn.  Sensitivities pending.  6. Left facial droop - stroke work up in progress, MRI negative for acute findings.   7. Hematuria - likely from recent catheter replacement, following.  8. Schizophrenia - resuming home medications, following.  9. Quadriplegia with neurogenic bladder - continue chronic foley, baclofen and tizanidine. 10. Anxiety / Depression - continue xanax/resperdone.  11. Constipation - continue laxatives. Dean Oliver.   DVT prophylaxis: lovenox Code Status: Full  Family Communication: none Disposition Plan: SNF  Subjective: Pt without specific complaints this morning.     Objective: Vitals:   10/06/17 2127 10/06/17 2137 10/07/17 0304 10/07/17 0409  BP: 110/65    (!) 146/88  Pulse: (!) 101   (!) 112  Resp: 16   20  Temp: 98 F (36.7 C)  (!) 102.1 F (38.9 C) 99 F (37.2 C)  TempSrc: Oral  Oral Oral  SpO2: 100% 92%  96%  Weight:      Height:        Intake/Output Summary (Last 24 hours) at 10/07/2017 1201 Last data filed at 10/07/2017 0411 Gross per 24 hour  Intake 3050 ml  Output 751 ml  Net 2299 ml   Filed Weights   10/05/17 1439 10/05/17 2159  Weight: 64.9 kg (143 lb 3 oz) 62.5 kg (137 lb 12.6 oz)     REVIEW OF SYSTEMS  As per history otherwise all reviewed and reported negative  Exam:  General exam: awake, alert, NAD. Cooperative.  Respiratory system: No increased work of breathing. Cardiovascular system: S1 & S2 heard, tachycardic. No JVD, murmurs, gallops, clicks or pedal edema. Gastrointestinal system: Abdomen is nondistended, soft and nontender. Normal bowel sounds heard. Central nervous system: Alert and oriented. Quadriplegic.  Extremities: no CCE.  Data Reviewed: Basic Metabolic Panel: Recent Labs  Lab 10/05/17 1444 10/06/17 0825 10/07/17 0435  NA 123* 132* 130*  K 5.0 3.2* 3.8  CL 89* 100* 103  CO2 20* 22 19*  GLUCOSE 170* 105* 109*  BUN 43* 26* 23*  CREATININE 1.66* 0.84 0.63  CALCIUM 9.2 8.5* 8.1*   Liver Function Tests: Recent Labs  Lab 10/05/17 1444 10/06/17 0825 10/07/17 0435  AST 34 32 45*  ALT 24 27 33  ALKPHOS 55 52 60  BILITOT 1.5* 1.3*  1.0  PROT 6.9 5.5* 5.7*  ALBUMIN 2.8* 2.3* 2.3*   No results for input(s): LIPASE, AMYLASE in the last 168 hours. No results for input(s): AMMONIA in the last 168 hours. CBC: Recent Labs  Lab 10/05/17 1444 10/06/17 0825 10/07/17 0435  WBC 6.5 6.5 8.0  NEUTROABS 4.9  --  7.1  HGB 11.8* 9.0* 8.6*  HCT 36.1* 27.8* 26.6*  MCV 82.4 84.8 83.9  PLT 300 228 241   Cardiac Enzymes: Recent Labs  Lab 10/05/17 1444 10/06/17 0858  TROPONINI 0.10* 1.60*   CBG (last 3)  No results for input(s): GLUCAP in the last 72 hours. Recent Results (from the  past 240 hour(s))  Culture, blood (Routine x 2)     Status: None (Preliminary result)   Collection Time: 10/05/17  2:44 PM  Result Value Ref Range Status   Specimen Description BLOOD RIGHT FOREARM  Final   Special Requests   Final    BOTTLES DRAWN AEROBIC AND ANAEROBIC Blood Culture adequate volume   Culture  Setup Time   Final    GRAM NEGATIVE RODS IN BOTH AEROBIC AND ANAEROBIC BOTTLES GRAM POSITIVE DIPLOCOCCI ANAEROBIC BOTTLE ONLY Gram Stain Report Called to,Read Back By and Verified With: LIGHTNER,N @ 0530 ON 1.17.19 BY BOWMAN,L CRITICAL VALUE NOTED.  VALUE IS CONSISTENT WITH PREVIOUSLY REPORTED AND CALLED VALUE. Performed at [facility], [place] [address] [place], [place] [address]    Culture GRAM NEGATIVE RODS  Final   Report Status PENDING  Incomplete  Culture, blood (Routine x 2)     Status: None (Preliminary result)   Collection Time: 10/05/17  2:52 PM  Result Value Ref Range Status   Specimen Description LEFT ANTECUBITAL  Final   Special Requests   Final    BOTTLES DRAWN AEROBIC AND ANAEROBIC Blood Culture results may not be optimal due to an inadequate volume of blood received in culture bottles   Culture  Setup Time   Final    GRAM NEGATIVE RODS ANAEROBIC BOTTLE ONLY GRAM POSITIVE DIPLOCOCCI ANAEROBIC BOTTLE ONLY Gram Stain Report Called to,Read Back By and Verified With: LIGHTNER,N @ 0530 ON 1.17.19 BY BOWMAN,L CRITICAL RESULT CALLED TO, READ BACK BY AND VERIFIED WITH: PHARMD A POOLE [id number] M WILSON GRAM POSITIVE COCCI AEB Gram Stain Report Called to,Read Back By and Verified With: MAYES @ 1340  ON [id number] BY HENDERSON L.    Culture GRAM NEGATIVE RODS  Final   Report Status PENDING  Incomplete  Blood Culture ID Panel (Reflexed)     Status: Abnormal   Collection Time: 10/05/17  2:52 PM  Result Value Ref Range Status   Enterococcus species NOT DETECTED NOT DETECTED Final   Listeria monocytogenes NOT DETECTED NOT DETECTED Final   Staphylococcus species NOT  DETECTED NOT DETECTED Final   Staphylococcus aureus NOT DETECTED NOT DETECTED Final   Streptococcus species NOT DETECTED NOT DETECTED Final   Streptococcus agalactiae NOT DETECTED NOT DETECTED Final   Streptococcus pneumoniae NOT DETECTED NOT DETECTED Final   Streptococcus pyogenes NOT DETECTED NOT DETECTED Final   Acinetobacter baumannii NOT DETECTED NOT DETECTED Final   Enterobacteriaceae species DETECTED (A) NOT DETECTED Final    Comment: Enterobacteriaceae represent a large family of gram-negative bacteria, not a single organism. CRITICAL RESULT CALLED TO, READ BACK BY AND VERIFIED WITH: LIrma Newness.D. 11:00 10/06/17 ([email or web address])    Enterobacter cloacae complex NOT DETECTED NOT DETECTED Final   Escherichia coli NOT DETECTED NOT DETECTED Final   Klebsiella oxytoca NOT DETECTED  NOT DETECTED Final   Klebsiella pneumoniae NOT DETECTED NOT DETECTED Final   Proteus species DETECTED (A) NOT DETECTED Final    Comment: CRITICAL RESULT CALLED TO, READ BACK BY AND VERIFIED WITH: LIrma Newness.D. 11:00 10/06/17 ([email or web address])    Serratia marcescens NOT DETECTED NOT DETECTED Final   Carbapenem resistance NOT DETECTED NOT DETECTED Final   Haemophilus influenzae NOT DETECTED NOT DETECTED Final   Neisseria meningitidis NOT DETECTED NOT DETECTED Final   Pseudomonas aeruginosa NOT DETECTED NOT DETECTED Final   Candida albicans NOT DETECTED NOT DETECTED Final   Candida glabrata NOT DETECTED NOT DETECTED Final   Candida krusei NOT DETECTED NOT DETECTED Final   Candida parapsilosis NOT DETECTED NOT DETECTED Final   Candida tropicalis NOT DETECTED NOT DETECTED Final  Urine culture     Status: Abnormal (Preliminary result)   Collection Time: 10/05/17  3:40 PM  Result Value Ref Range Status   Specimen Description URINE, CLEAN CATCH  Final   Special Requests NONE  Final   Culture (A)  Final    >=100,000 COLONIES/mL PROTEUS MIRABILIS SUSCEPTIBILITIES TO FOLLOW Performed at [facility], [place]  [address], [place], [place] [address]    Report Status PENDING  Incomplete  MRSA PCR Screening     Status: None   Collection Time: 10/05/17 10:51 PM  Result Value Ref Range Status   MRSA by PCR NEGATIVE NEGATIVE Final    Comment:        The GeneXpert MRSA Assay (FDA approved for NASAL specimens only), is one component of a comprehensive MRSA colonization surveillance program. It is not intended to diagnose MRSA infection nor to guide or monitor treatment for MRSA infections.      Studies: Ct Head Wo Contrast  Result Date: 10/05/2017 CLINICAL DATA:  Quadriplegia.  Fever.  Lethargy.  Ataxia. EXAM: CT HEAD WITHOUT CONTRAST TECHNIQUE: Contiguous axial images were obtained from the base of the skull through the vertex without intravenous contrast. COMPARISON:  08/13/2017 head CT. FINDINGS: Brain: No evidence of parenchymal hemorrhage or extra-axial fluid collection. No mass lesion, mass effect, or midline shift. No CT evidence of acute infarction. Mild generalized cerebral volume loss. No ventriculomegaly. Vascular: No acute abnormality. Skull: No evidence of calvarial fracture. Sinuses/Orbits: The visualized paranasal sinuses are essentially clear. Other:  The mastoid air cells are unopacified. IMPRESSION: 1.  No evidence of acute intracranial abnormality. 2. Mild generalized cerebral volume loss. Electronically Signed   By: Ilona Sorrel M.D.   On: 10/05/2017 20:48   Mr Brain Wo Contrast  Result Date: 10/06/2017 CLINICAL DATA:  Quadriplegic patient with altered mental status over the last 2 days. EXAM: MRI HEAD WITHOUT CONTRAST TECHNIQUE: Multiplanar, multiecho pulse sequences of the brain and surrounding structures were obtained without intravenous contrast. COMPARISON:  CT 10/05/2017.  MRI 08/13/2017. FINDINGS: Brain: Diffusion imaging does not show any acute or subacute infarction. The brainstem and cerebellum are normal. Cerebral hemispheres are normal except for a tiny cyst or old lacunar  infarction in the left basal ganglia. No evidence of widespread ischemic changes. No large vessel insult. No mass lesion, hemorrhage, hydrocephalus or extra-axial collection. Vascular: Major vessels at the base of the brain show flow. Skull and upper cervical spine: Negative except for possible myelomalacia at the C2-3 cord. Sinuses/Orbits: Clear/normal Other: None IMPRESSION: No acute intracranial finding. Normal except for a tiny cyst or lacunar infarction in the left basal ganglia. Electronically Signed   By: Nelson Chimes M.D.   On: 10/06/2017 10:47  Dg Chest Port 1 View  Result Date: 10/05/2017 CLINICAL DATA:  72 year old male code sepsis.  Fever and hematuria. EXAM: PORTABLE CHEST 1 VIEW COMPARISON:  08/12/2017 portable chest. FINDINGS: Portable AP upright view at 1557 hrs. Similar low lung volumes. Mediastinal contours are stable and within normal limits. No pneumothorax, pulmonary edema, pleural effusion or consolidation. Mild perihilar crowding of of lung markings with no convincing airspace opacity. Mild gaseous distension of bowel in the visible left abdomen. IMPRESSION: Low lung volumes, otherwise no acute cardiopulmonary abnormality. Electronically Signed   By: Genevie Ann M.D.   On: 10/05/2017 16:15   Ct Renal Stone Study  Result Date: 10/05/2017 CLINICAL DATA:  Gross hematuria.  Fever of unknown origin. EXAM: CT ABDOMEN AND PELVIS WITHOUT CONTRAST TECHNIQUE: Multidetector CT imaging of the abdomen and pelvis was performed following the standard protocol without IV contrast. COMPARISON:  None. FINDINGS: Lower chest: The patient has bilateral patchy lower lobe pulmonary infiltrates as well as a small area of infiltrate in the lingula. No effusions. Coronary artery calcification. Heart size is normal. Hepatobiliary: 17 mm low-density lesion in the posterior aspect of the right lobe of the liver consistent with a cyst. Liver parenchyma is otherwise normal. No biliary ductal dilatation. Pancreas: The  pancreas is not well-defined but there is no discrete abnormality. Spleen: Normal. Adrenals/Urinary Tract: The kidneys and adrenal glands appear normal. No hydronephrosis. A Foley catheter is in place. There is diffuse thickening of the bladder wall without a focal mass. Stomach/Bowel: Prominent stool in the rectum. Slight gaseous distention of the colon. Moderate stool in the descending colon. No free air. No dilated small bowel. Vascular/Lymphatic: No significant vascular findings are present. No enlarged abdominal or pelvic lymph nodes. Reproductive: Prostate is unremarkable. Other: There is a small amount of ascites in the abdomen and pelvis. Anasarca. Musculoskeletal: No acute or significant osseous findings. IMPRESSION: 1. Patchy bilateral lower lobe infiltrates. 2. Diffuse thickening of the bladder wall with Foley catheter in place. No mass or hydronephrosis. 3. Prominent stool in the rectum and descending colon with gaseous distention of the colon. 4. Small amount of ascites.  Anasarca. Electronically Signed   By: Lorriane Shire M.D.   On: 10/05/2017 18:05   Scheduled Meds: . aspirin  300 mg Rectal Daily   Or  . aspirin  325 mg Oral Daily  . enoxaparin (LOVENOX) injection  40 mg Subcutaneous Q24H  . pneumococcal 23 valent vaccine  0.5 mL Intramuscular Tomorrow-1000   Continuous Infusions: . cefTRIAXone (ROCEPHIN)  IV Stopped (10/06/17 1313)    Principal Problem:   Sepsis secondary to UTI ([place]) Active Problems:   Schizophrenia ([place] [place])   Hyponatremia   Neurogenic bladder   Neurogenic bowel   Quadriplegia (HCC)   AKI (acute kidney injury) ([place])   HCAP (healthcare-associated pneumonia)   Pressure injury of skin   Fever in adult   Gross hematuria   Urinary tract infection with hematuria  Time spent:   Irwin Brakeman, MD, FAAFP [facility] Pager [phone]  If 7PM-7AM, please contact night-coverage [email or web address] Password [email or web address] 10/07/2017, 12:01 PM    LOS: 2 days

## 2017-10-07 NOTE — Clinical Social Work Note (Signed)
LCSW spoke with patient's cousin/legal guardian, Harlen Danford, who advised that she would like to see if [facility]F or [facility] could accept patient. She stated that if these places do not accept patient she will have patient to go back to [place].    Darik Massing, Clydene Pugh, LCSW

## 2017-10-07 NOTE — Care Management Important Message (Signed)
Important Message  Patient Details  Name: Dean Oliver MRN: [id number] Date of Birth: 07/07/1946   Medicare Important Message Given:  Yes    Sherald Barge, RN 10/07/2017, 10:35 AM

## 2017-10-07 NOTE — Progress Notes (Signed)
OT Note - Addendum G-Codes    08/31/17 1520  OT Visit Information  Last OT Received On August 31, 2017  OT G-codes **NOT FOR INPATIENT CLASS**  Functional Assessment Tool Used Clinical judgement  Functional Limitation Self care  Self Care Current Status [phone]) CN  Self Care Goal Status (G8115) [place], OT/L  [phone] 08-31-2017

## 2017-10-07 NOTE — Progress Notes (Signed)
Initial Nutrition Assessment  DOCUMENTATION CODES:      INTERVENTION:  -Ensure Enlive po BID, each supplement provides 350 kcal and 20 grams of protein  (Patient prefers chocolate)   -Fed by staff all meals and assist with oral supplements between meals  NUTRITION DIAGNOSIS:   Inadequate oral intake(in the setting of inflammation) related to acute illness(severe sepsis) as evidenced by estimated needs, per patient/family report, edema.   GOAL: Pt to meet >/= 90% of their estimated nutrition needs     MONITOR:   PO intake, Labs, I & O's, Weight trends, Supplement acceptance, Skin  REASON FOR ASSESSMENT:   Malnutrition Screening Tool    ASSESSMENT: Patient is a 72 yo quadriplegic (central cord injury)from [place]. Hx of schizophrenia, HTN and bilateral cataracts. He presents with fever, gross bloody urine output (neurogenic bladder) and c/o increased letheragy. Labs: Sodium 130, Alb 2.3, Hemoglobin 8.6.  MRI of brain: no acute intracranial findings. EKG obtained.   Assessed by ST and diet advanced to Dyshaphia 3. Documented meal intake 50%. He has not been receiving oral supplements. He is edentulous and says he used to have dentures but lost them. Patient requires assistance with meals, ONS and hydration.  He tells RD usual weight is 140 lb. Minimal wt change based on pt report.    NUTRITION - FOCUSED PHYSICAL EXAM: Mild temporal muscle loss, mild buccal fat depletion  Diet Order:  DIET DYS 3 Room service appropriate? Yes; Fluid consistency: Thin  EDUCATION NEEDS:  No education needs have been identified at this time Skin:  Skin Assessment: Skin Integrity Issues: Skin Integrity Issues:: Stage II Stage II: penis  Last BM:  1/17   Height:   Ht Readings from Last 1 Encounters:  10/05/17 5\' 7"  (1.702 m)    Weight:   Wt Readings from Last 1 Encounters:  10/05/17 137 lb 12.6 oz (62.5 kg)    Ideal Body Weight:  61 kg(adj. due to quadriplegia)  BMI:  Body mass  index is 21.58 kg/m.  Estimated Nutritional Needs:   Kcal:  1500-1700  Protein:  73-80 gr  Fluid:  >1500 ml daily  Colman Cater MS,RD,CSG,LDN Office: [phone] Pager: [phone]

## 2017-10-08 DIAGNOSIS — G8250 Quadriplegia, unspecified: Secondary | ICD-10-CM | POA: Diagnosis not present

## 2017-10-08 DIAGNOSIS — F209 Schizophrenia, unspecified: Secondary | ICD-10-CM | POA: Diagnosis not present

## 2017-10-08 DIAGNOSIS — J189 Pneumonia, unspecified organism: Secondary | ICD-10-CM | POA: Diagnosis not present

## 2017-10-08 DIAGNOSIS — N319 Neuromuscular dysfunction of bladder, unspecified: Secondary | ICD-10-CM | POA: Diagnosis not present

## 2017-10-08 DIAGNOSIS — R509 Fever, unspecified: Secondary | ICD-10-CM | POA: Diagnosis not present

## 2017-10-08 DIAGNOSIS — K592 Neurogenic bowel, not elsewhere classified: Secondary | ICD-10-CM | POA: Diagnosis not present

## 2017-10-08 DIAGNOSIS — E871 Hypo-osmolality and hyponatremia: Secondary | ICD-10-CM | POA: Diagnosis not present

## 2017-10-08 DIAGNOSIS — N179 Acute kidney failure, unspecified: Secondary | ICD-10-CM | POA: Diagnosis not present

## 2017-10-08 DIAGNOSIS — A419 Sepsis, unspecified organism: Secondary | ICD-10-CM | POA: Diagnosis not present

## 2017-10-08 DIAGNOSIS — N390 Urinary tract infection, site not specified: Secondary | ICD-10-CM | POA: Diagnosis not present

## 2017-10-08 DIAGNOSIS — R310 Gross hematuria: Secondary | ICD-10-CM | POA: Diagnosis not present

## 2017-10-08 DIAGNOSIS — R319 Hematuria, unspecified: Secondary | ICD-10-CM | POA: Diagnosis not present

## 2017-10-08 LAB — CBC WITH DIFFERENTIAL/PLATELET
BASOS ABS: 0 10*3/uL (ref 0.0–0.1)
Basophils Relative: 0 %
EOS PCT: 1 %
Eosinophils Absolute: 0.1 10*3/uL (ref 0.0–0.7)
HEMATOCRIT: 25.6 % — AB (ref 39.0–52.0)
Hemoglobin: 8.1 g/dL — ABNORMAL LOW (ref 13.0–17.0)
LYMPHS PCT: 9 %
Lymphs Abs: 0.7 10*3/uL (ref 0.7–4.0)
MCH: 27.2 pg (ref 26.0–34.0)
MCHC: 31.6 g/dL (ref 30.0–36.0)
MCV: 85.9 fL (ref 78.0–100.0)
MONO ABS: 0.9 10*3/uL (ref 0.1–1.0)
MONOS PCT: 11 %
NEUTROS ABS: 5.9 10*3/uL (ref 1.7–7.7)
Neutrophils Relative %: 79 %
PLATELETS: 254 10*3/uL (ref 150–400)
RBC: 2.98 MIL/uL — ABNORMAL LOW (ref 4.22–5.81)
RDW: 12.4 % (ref 11.5–15.5)
WBC: 7.6 10*3/uL (ref 4.0–10.5)

## 2017-10-08 LAB — COMPREHENSIVE METABOLIC PANEL
ALT: 54 U/L (ref 17–63)
ANION GAP: 8 (ref 5–15)
AST: 55 U/L — AB (ref 15–41)
Albumin: 2.1 g/dL — ABNORMAL LOW (ref 3.5–5.0)
Alkaline Phosphatase: 69 U/L (ref 38–126)
BILIRUBIN TOTAL: 0.4 mg/dL (ref 0.3–1.2)
BUN: 14 mg/dL (ref 6–20)
CHLORIDE: 103 mmol/L (ref 101–111)
CO2: 20 mmol/L — ABNORMAL LOW (ref 22–32)
Calcium: 7.8 mg/dL — ABNORMAL LOW (ref 8.9–10.3)
Creatinine, Ser: 0.39 mg/dL — ABNORMAL LOW (ref 0.61–1.24)
Glucose, Bld: 144 mg/dL — ABNORMAL HIGH (ref 65–99)
Potassium: 4 mmol/L (ref 3.5–5.1)
Sodium: 131 mmol/L — ABNORMAL LOW (ref 135–145)
TOTAL PROTEIN: 5.4 g/dL — AB (ref 6.5–8.1)

## 2017-10-08 LAB — URINE CULTURE: Culture: >=100000 — AB

## 2017-10-08 MED ORDER — FAMOTIDINE 20 MG PO TABS
20.0000 mg | ORAL_TABLET | Freq: Every day | ORAL | Status: DC
  Administered 2017-10-08 – 2017-10-10 (×3): 20 mg via ORAL
  Filled 2017-10-08 (×3): qty 1

## 2017-10-08 MED ORDER — ONDANSETRON HCL 4 MG/2ML IJ SOLN: 4.0000 mg | Freq: Four times a day (QID) | INTRAMUSCULAR | Status: DC | PRN

## 2017-10-08 NOTE — Progress Notes (Signed)
PROGRESS NOTE    Dean Oliver  RUE:[id number]  DOB: [birth year]  DOA: 10/05/2017 PCP: Patient, No Pcp Per   Brief Admission Hx: Dean Oliver is a 72 y.o. male with medical history of quadriplegia, schizophrenia chronic foley cath who presents with a fever of 102 and bloody urine after a catheter change today. At baseline, he is alert and communicative but his cousins who are giving me a history state that he has been lethargic. Of note, CT scan suggests a pneumonia. His cousin noted him coughing today. He was also in close proximity and was drinking sharing utensils with a person who has been diagnosed with a pneumonia and has been hospitalized.  In the ER, the cousins also notice a left facial droop which they have not noticed before.  MDM/Assessment & Plan:   1. Severe sepsis - continue IV antibiotics and supportive care, lactate trending down, de-escalate antibiotics as clinically improving.   2. Sinus tachycardia - secondary to severe sepsis, treating as above. Follow. Slowly improving with treatment.  3. Elevated troponin - secondary to demand ischemia, following.  4. UTI - continue IV ceftriaxone 2 gm every 24 hours.  5. Bacteremia - Proteus species reported, continue IV ceftriaxone, DC zosyn.  Sensitivities pending.  6. Left facial droop - stroke work up in progress, MRI negative for acute findings.   7. Hematuria - resolved now.  likely from recent catheter replacement, following.  8. Schizophrenia - resumed home medications, following.  9. Quadriplegia with neurogenic bladder - continue chronic foley, baclofen and tizanidine. 10. Anxiety / Depression - continue xanax/resperdone.  11. Constipation - continue laxatives. He is having BMs.   DVT prophylaxis: lovenox Code Status: Full  Family Communication: none Disposition Plan: SNF  Subjective: Pt feels nauseated after eating breakfast this morning.      Objective: Vitals:   10/07/17 0304 10/07/17 0409 10/07/17 2100  10/08/17 0626  BP:  (!) 146/88 121/76 140/82  Pulse:  (!) 112 91 90  Resp:  20 16 17   Temp: (!) 102.1 F (38.9 C) 99 F (37.2 C) 100.3 F (37.9 C) 99.2 F (37.3 C)  TempSrc: Oral Oral Oral Oral  SpO2:  96% 100% 97%  Weight:      Height:        Intake/Output Summary (Last 24 hours) at 10/08/2017 1036 Last data filed at 10/08/2017 0800 Gross per 24 hour  Intake 3220 ml  Output 890 ml  Net 2330 ml   Filed Weights   10/05/17 1439 10/05/17 2159  Weight: 64.9 kg (143 lb 3 oz) 62.5 kg (137 lb 12.6 oz)     REVIEW OF SYSTEMS  As per history otherwise all reviewed and reported negative  Exam:  General exam: awake, alert, NAD. Cooperative.  Respiratory system: No increased work of breathing. Cardiovascular system: S1 & S2 heard, tachycardic. No JVD, murmurs, gallops, clicks or pedal edema. Gastrointestinal system: Abdomen is nondistended, soft and nontender. Normal bowel sounds heard. Central nervous system: Alert and oriented. Quadriplegic.  Extremities: no CCE.  Data Reviewed: Basic Metabolic Panel: Recent Labs  Lab 10/05/17 1444 10/06/17 0825 10/07/17 0435  NA 123* 132* 130*  K 5.0 3.2* 3.8  CL 89* 100* 103  CO2 20* 22 19*  GLUCOSE 170* 105* 109*  BUN 43* 26* 23*  CREATININE 1.66* 0.84 0.63  CALCIUM 9.2 8.5* 8.1*   Liver Function Tests: Recent Labs  Lab 10/05/17 1444 10/06/17 0825 10/07/17 0435  AST 34 32 45*  ALT 24 27 33  ALKPHOS 55 52 60  BILITOT 1.5* 1.3* 1.0  PROT 6.9 5.5* 5.7*  ALBUMIN 2.8* 2.3* 2.3*   No results for input(s): LIPASE, AMYLASE in the last 168 hours. No results for input(s): AMMONIA in the last 168 hours. CBC: Recent Labs  Lab 10/05/17 1444 10/06/17 0825 10/07/17 0435  WBC 6.5 6.5 8.0  NEUTROABS 4.9  --  7.1  HGB 11.8* 9.0* 8.6*  HCT 36.1* 27.8* 26.6*  MCV 82.4 84.8 83.9  PLT 300 228 241   Cardiac Enzymes: Recent Labs  Lab 10/05/17 1444 10/06/17 0858  TROPONINI 0.10* 1.60*   CBG (last 3)  No results for input(s):  GLUCAP in the last 72 hours. Recent Results (from the past 240 hour(s))  Culture, blood (Routine x 2)     Status: Abnormal (Preliminary result)   Collection Time: 10/05/17  2:44 PM  Result Value Ref Range Status   Specimen Description BLOOD RIGHT FOREARM  Final   Special Requests   Final    BOTTLES DRAWN AEROBIC AND ANAEROBIC Blood Culture adequate volume   Culture  Setup Time   Final    GRAM NEGATIVE RODS IN BOTH AEROBIC AND ANAEROBIC BOTTLES GRAM POSITIVE DIPLOCOCCI ANAEROBIC BOTTLE ONLY Gram Stain Report Called to,Read Back By and Verified With: LIGHTNER,N @ 0530 ON 1.17.19 BY BOWMAN,L CRITICAL VALUE NOTED.  VALUE IS CONSISTENT WITH PREVIOUSLY REPORTED AND CALLED VALUE. Performed at [facility], [place] [address], [place], [place] [address]    Culture PROTEUS MIRABILIS (A)  Final   Report Status PENDING  Incomplete  Culture, blood (Routine x 2)     Status: Abnormal (Preliminary result)   Collection Time: 10/05/17  2:52 PM  Result Value Ref Range Status   Specimen Description LEFT ANTECUBITAL  Final   Special Requests   Final    BOTTLES DRAWN AEROBIC AND ANAEROBIC Blood Culture results may not be optimal due to an inadequate volume of blood received in culture bottles   Culture  Setup Time   Final    GRAM NEGATIVE RODS ANAEROBIC BOTTLE ONLY GRAM POSITIVE DIPLOCOCCI ANAEROBIC BOTTLE ONLY Gram Stain Report Called to,Read Back By and Verified With: LIGHTNER,N @ 0530 ON 1.17.19 BY BOWMAN,L CRITICAL RESULT CALLED TO, READ BACK BY AND VERIFIED WITH: PHARMD A POOLE [id number] 16 M WILSON GRAM POSITIVE COCCI AEB Gram Stain Report Called to,Read Back By and Verified With: MAYES @ 1340  ON [id number] BY HENDERSON L.    Culture (A)  Final    STAPHYLOCOCCUS SPECIES (COAGULASE NEGATIVE) CULTURE REINCUBATED FOR BETTER GROWTH PROTEUS MIRABILIS    Report Status PENDING  Incomplete   Organism ID, Bacteria PROTEUS MIRABILIS  Final      Susceptibility   Proteus mirabilis - MIC*    AMPICILLIN  <=2 SENSITIVE Sensitive     CEFAZOLIN <=4 SENSITIVE Sensitive     CEFEPIME <=1 SENSITIVE Sensitive     CEFTAZIDIME <=1 SENSITIVE Sensitive     CEFTRIAXONE <=1 SENSITIVE Sensitive     CIPROFLOXACIN >=4 RESISTANT Resistant     GENTAMICIN <=1 SENSITIVE Sensitive     IMIPENEM 1 SENSITIVE Sensitive     TRIMETH/SULFA <=20 SENSITIVE Sensitive     AMPICILLIN/SULBACTAM <=2 SENSITIVE Sensitive     PIP/TAZO <=4 SENSITIVE Sensitive     * PROTEUS MIRABILIS  Blood Culture ID Panel (Reflexed)     Status: Abnormal   Collection Time: 10/05/17  2:52 PM  Result Value Ref Range Status   Enterococcus species NOT DETECTED NOT DETECTED Final   Listeria  monocytogenes NOT DETECTED NOT DETECTED Final   Staphylococcus species NOT DETECTED NOT DETECTED Final   Staphylococcus aureus NOT DETECTED NOT DETECTED Final   Streptococcus species NOT DETECTED NOT DETECTED Final   Streptococcus agalactiae NOT DETECTED NOT DETECTED Final   Streptococcus pneumoniae NOT DETECTED NOT DETECTED Final   Streptococcus pyogenes NOT DETECTED NOT DETECTED Final   Acinetobacter baumannii NOT DETECTED NOT DETECTED Final   Enterobacteriaceae species DETECTED (A) NOT DETECTED Final    Comment: Enterobacteriaceae represent a large family of gram-negative bacteria, not a single organism. CRITICAL RESULT CALLED TO, READ BACK BY AND VERIFIED WITH: LIrma Newness.D. 11:00 10/06/17 ([email or web address])    Enterobacter cloacae complex NOT DETECTED NOT DETECTED Final   Escherichia coli NOT DETECTED NOT DETECTED Final   Klebsiella oxytoca NOT DETECTED NOT DETECTED Final   Klebsiella pneumoniae NOT DETECTED NOT DETECTED Final   Proteus species DETECTED (A) NOT DETECTED Final    Comment: CRITICAL RESULT CALLED TO, READ BACK BY AND VERIFIED WITH: LIrma Newness.D. 11:00 10/06/17 ([email or web address])    Serratia marcescens NOT DETECTED NOT DETECTED Final   Carbapenem resistance NOT DETECTED NOT DETECTED Final   Haemophilus influenzae NOT DETECTED NOT DETECTED Final     Neisseria meningitidis NOT DETECTED NOT DETECTED Final   Pseudomonas aeruginosa NOT DETECTED NOT DETECTED Final   Candida albicans NOT DETECTED NOT DETECTED Final   Candida glabrata NOT DETECTED NOT DETECTED Final   Candida krusei NOT DETECTED NOT DETECTED Final   Candida parapsilosis NOT DETECTED NOT DETECTED Final   Candida tropicalis NOT DETECTED NOT DETECTED Final  Urine culture     Status: Abnormal   Collection Time: 10/05/17  3:40 PM  Result Value Ref Range Status   Specimen Description URINE, CLEAN CATCH  Final   Special Requests NONE  Final   Culture >=100,000 COLONIES/mL PROTEUS MIRABILIS (A)  Final   Report Status 10/08/2017 FINAL  Final   Organism ID, Bacteria PROTEUS MIRABILIS (A)  Final      Susceptibility   Proteus mirabilis - MIC*    AMPICILLIN <=2 SENSITIVE Sensitive     CEFAZOLIN 8 SENSITIVE Sensitive     CEFTRIAXONE <=1 SENSITIVE Sensitive     CIPROFLOXACIN >=4 RESISTANT Resistant     GENTAMICIN <=1 SENSITIVE Sensitive     IMIPENEM 2 SENSITIVE Sensitive     NITROFURANTOIN 128 RESISTANT Resistant     TRIMETH/SULFA <=20 SENSITIVE Sensitive     AMPICILLIN/SULBACTAM <=2 SENSITIVE Sensitive     PIP/TAZO <=4 SENSITIVE Sensitive     * >=100,000 COLONIES/mL PROTEUS MIRABILIS  MRSA PCR Screening     Status: None   Collection Time: 10/05/17 10:51 PM  Result Value Ref Range Status   MRSA by PCR NEGATIVE NEGATIVE Final    Comment:        The GeneXpert MRSA Assay (FDA approved for NASAL specimens only), is one component of a comprehensive MRSA colonization surveillance program. It is not intended to diagnose MRSA infection nor to guide or monitor treatment for MRSA infections.      Studies: No results found. Scheduled Meds: . aspirin  300 mg Rectal Daily   Or  . aspirin  325 mg Oral Daily  . enoxaparin (LOVENOX) injection  40 mg Subcutaneous Q24H  . famotidine  20 mg Oral Daily  . feeding supplement (ENSURE ENLIVE)  237 mL Oral BID BM  . pneumococcal 23  valent vaccine  0.5 mL Intramuscular Tomorrow-1000   Continuous Infusions: . 0.9 % NaCl with KCl 20 mEq /  L 150 mL/hr at 10/08/17 1034  . cefTRIAXone (ROCEPHIN)  IV Stopped (10/07/17 1442)    Principal Problem:   Sepsis secondary to UTI [facility]) Active Problems:   Schizophrenia ([place])   Hyponatremia   Neurogenic bladder   Neurogenic bowel   Quadriplegia (HCC)   AKI (acute kidney injury) ([place])   HCAP (healthcare-associated pneumonia)   Pressure injury of skin   Fever in adult   Gross hematuria   Urinary tract infection with hematuria  Time spent:   Irwin Brakeman, MD, FAAFP [facility] Pager [phone]  If 7PM-7AM, please contact night-coverage [email or web address] Password [email or web address] 10/08/2017, 10:36 AM    LOS: 3 days

## 2017-10-09 DIAGNOSIS — E871 Hypo-osmolality and hyponatremia: Secondary | ICD-10-CM | POA: Diagnosis not present

## 2017-10-09 DIAGNOSIS — K592 Neurogenic bowel, not elsewhere classified: Secondary | ICD-10-CM | POA: Diagnosis not present

## 2017-10-09 DIAGNOSIS — R310 Gross hematuria: Secondary | ICD-10-CM | POA: Diagnosis not present

## 2017-10-09 DIAGNOSIS — R509 Fever, unspecified: Secondary | ICD-10-CM | POA: Diagnosis not present

## 2017-10-09 DIAGNOSIS — J189 Pneumonia, unspecified organism: Secondary | ICD-10-CM | POA: Diagnosis not present

## 2017-10-09 DIAGNOSIS — A419 Sepsis, unspecified organism: Secondary | ICD-10-CM | POA: Diagnosis not present

## 2017-10-09 DIAGNOSIS — N319 Neuromuscular dysfunction of bladder, unspecified: Secondary | ICD-10-CM | POA: Diagnosis not present

## 2017-10-09 DIAGNOSIS — F209 Schizophrenia, unspecified: Secondary | ICD-10-CM | POA: Diagnosis not present

## 2017-10-09 DIAGNOSIS — G8250 Quadriplegia, unspecified: Secondary | ICD-10-CM | POA: Diagnosis not present

## 2017-10-09 DIAGNOSIS — N390 Urinary tract infection, site not specified: Secondary | ICD-10-CM | POA: Diagnosis not present

## 2017-10-09 DIAGNOSIS — R319 Hematuria, unspecified: Secondary | ICD-10-CM | POA: Diagnosis not present

## 2017-10-09 DIAGNOSIS — N179 Acute kidney failure, unspecified: Secondary | ICD-10-CM | POA: Diagnosis not present

## 2017-10-09 LAB — CBC WITH DIFFERENTIAL/PLATELET
BASOS PCT: 0 %
Basophils Absolute: 0 10*3/uL (ref 0.0–0.1)
Eosinophils Absolute: 0.1 10*3/uL (ref 0.0–0.7)
Eosinophils Relative: 1 %
HEMATOCRIT: 26.3 % — AB (ref 39.0–52.0)
HEMOGLOBIN: 8.4 g/dL — AB (ref 13.0–17.0)
LYMPHS ABS: 0.9 10*3/uL (ref 0.7–4.0)
Lymphocytes Relative: 11 %
MCH: 26.8 pg (ref 26.0–34.0)
MCHC: 31.9 g/dL (ref 30.0–36.0)
MCV: 84 fL (ref 78.0–100.0)
MONOS PCT: 14 %
Monocytes Absolute: 1.1 10*3/uL — ABNORMAL HIGH (ref 0.1–1.0)
NEUTROS ABS: 6.1 10*3/uL (ref 1.7–7.7)
NEUTROS PCT: 74 %
Platelets: 261 10*3/uL (ref 150–400)
RBC: 3.13 MIL/uL — ABNORMAL LOW (ref 4.22–5.81)
RDW: 12.8 % (ref 11.5–15.5)
WBC: 8.2 10*3/uL (ref 4.0–10.5)

## 2017-10-09 LAB — COMPREHENSIVE METABOLIC PANEL
ALK PHOS: 65 U/L (ref 38–126)
ALT: 46 U/L (ref 17–63)
ANION GAP: 9 (ref 5–15)
AST: 32 U/L (ref 15–41)
Albumin: 2.1 g/dL — ABNORMAL LOW (ref 3.5–5.0)
BILIRUBIN TOTAL: 0.6 mg/dL (ref 0.3–1.2)
BUN: 10 mg/dL (ref 6–20)
CO2: 21 mmol/L — ABNORMAL LOW (ref 22–32)
Calcium: 8.2 mg/dL — ABNORMAL LOW (ref 8.9–10.3)
Chloride: 104 mmol/L (ref 101–111)
Creatinine, Ser: 0.32 mg/dL — ABNORMAL LOW (ref 0.61–1.24)
GLUCOSE: 115 mg/dL — AB (ref 65–99)
POTASSIUM: 3.7 mmol/L (ref 3.5–5.1)
Sodium: 134 mmol/L — ABNORMAL LOW (ref 135–145)
TOTAL PROTEIN: 5.3 g/dL — AB (ref 6.5–8.1)

## 2017-10-09 LAB — CULTURE, BLOOD (ROUTINE X 2): Special Requests: ADEQUATE

## 2017-10-09 MED ORDER — AMOXICILLIN-POT CLAVULANATE 500-125 MG PO TABS
1.0000 | ORAL_TABLET | Freq: Two times a day (BID) | ORAL | Status: DC
  Administered 2017-10-10: 500 mg via ORAL
  Filled 2017-10-09: qty 1

## 2017-10-09 MED ORDER — MINERAL OIL RE ENEM
1.0000 | ENEMA | Freq: Once | RECTAL | Status: AC
  Administered 2017-10-09: 1 via RECTAL

## 2017-10-09 NOTE — Progress Notes (Addendum)
PROGRESS NOTE  Dean Oliver  IZT:[id number]  DOB: 1945-10-06  DOA: 10/05/2017 PCP: Patient, No Pcp Per  Brief Admission Hx: Dean Oliver is a 72 y.o. male with medical history of quadriplegia, schizophrenia chronic foley cath who presents with a fever of 102 and bloody urine after a catheter change today. At baseline, he is alert and communicative but his cousins who are giving me a history state that he has been lethargic. Of note, CT scan suggests a pneumonia. His cousin noted him coughing today. He was also in close proximity and was drinking sharing utensils with a person who has been diagnosed with a pneumonia and has been hospitalized.  In the ER, the cousins also notice a left facial droop which they have not noticed before.  MDM/Assessment & Plan:   1. Severe sepsis - continue IV antibiotics and supportive care, lactate trending down, de-escalate antibiotics as clinically improving.  Start oral augmentin 1/21.  2. Sinus tachycardia - secondary to severe sepsis, treating as above. Follow. Slowly improving with treatment.  3. Elevated troponin - secondary to demand ischemia, following.  4. Proteus mirabilis UTI - continue IV ceftriaxone 2 gm every 24 hours.  5. Proteus mirabilis Bacteremia - Proteus species reported, continue IV ceftriaxone, DC zosyn. Augmentin to start orally 1/21.   6. Left facial droop - resolved now.  MRI negative for acute findings.   7. Hematuria - resolved now.  likely from recent catheter replacement, following.  8. Schizophrenia - resumed home medications, following.  9. Quadriplegia with neurogenic bladder - continue chronic foley, baclofen and tizanidine. 10. Anxiety / Depression - continue xanax/respirdone.  11. Constipation - Enema ordered today per patient request.   DVT prophylaxis: lovenox Code Status: Full  Family Communication: none Disposition Plan: SNF 1/21  Subjective: Pt requesting an enema to be given to evacuate his bowels.      Objective: Vitals:   10/08/17 0626 10/08/17 1318 10/08/17 2032 10/09/17 0515  BP: 140/82 128/73 (!) 148/89 (!) 150/84  Pulse: 90 89 89 95  Resp: 17 18 18 18   Temp: 99.2 F (37.3 C) 99 F (37.2 C) 99.4 F (37.4 C) 99.9 F (37.7 C)  TempSrc: Oral Oral Oral Oral  SpO2: 97% 96% 100% 99%  Weight:      Height:        Intake/Output Summary (Last 24 hours) at 10/09/2017 3825 Last data filed at 10/09/2017 0516 Gross per 24 hour  Intake 770 ml  Output 1500 ml  Net -730 ml   Filed Weights   10/05/17 1439 10/05/17 2159  Weight: 64.9 kg (143 lb 3 oz) 62.5 kg (137 lb 12.6 oz)   REVIEW OF SYSTEMS  As per history otherwise all reviewed and reported negative  Exam:  General exam: awake, alert, NAD. Cooperative.  Respiratory system: No increased work of breathing. Cardiovascular system: S1 & S2 heard, tachycardic. No JVD, murmurs, gallops, clicks or pedal edema. Gastrointestinal system: Abdomen is nondistended, soft and nontender. Normal bowel sounds heard. Central nervous system: Alert and oriented. Quadriplegic.  Extremities: no CCE.  Data Reviewed: Basic Metabolic Panel: Recent Labs  Lab 10/05/17 1444 10/06/17 0825 10/07/17 0435 10/08/17 1509  NA 123* 132* 130* 131*  K 5.0 3.2* 3.8 4.0  CL 89* 100* 103 103  CO2 20* 22 19* 20*  GLUCOSE 170* 105* 109* 144*  BUN 43* 26* 23* 14  CREATININE 1.66* 0.84 0.63 0.39*  CALCIUM 9.2 8.5* 8.1* 7.8*   Liver Function Tests: Recent Labs  Lab 10/05/17  1444 10/06/17 0825 10/07/17 0435 10/08/17 1509  AST 34 32 45* 55*  ALT 24 27 33 54  ALKPHOS 55 52 60 69  BILITOT 1.5* 1.3* 1.0 0.4  PROT 6.9 5.5* 5.7* 5.4*  ALBUMIN 2.8* 2.3* 2.3* 2.1*   No results for input(s): LIPASE, AMYLASE in the last 168 hours. No results for input(s): AMMONIA in the last 168 hours. CBC: Recent Labs  Lab 10/05/17 1444 10/06/17 0825 10/07/17 0435 10/08/17 1509  WBC 6.5 6.5 8.0 7.6  NEUTROABS 4.9  --  7.1 5.9  HGB 11.8* 9.0* 8.6* 8.1*  HCT 36.1*  27.8* 26.6* 25.6*  MCV 82.4 84.8 83.9 85.9  PLT 300 228 241 254   Cardiac Enzymes: Recent Labs  Lab 10/05/17 1444 10/06/17 0858  TROPONINI 0.10* 1.60*   CBG (last 3)  No results for input(s): GLUCAP in the last 72 hours. Recent Results (from the past 240 hour(s))  Culture, blood (Routine x 2)     Status: Abnormal (Preliminary result)   Collection Time: 10/05/17  2:44 PM  Result Value Ref Range Status   Specimen Description BLOOD RIGHT FOREARM  Final   Special Requests   Final    BOTTLES DRAWN AEROBIC AND ANAEROBIC Blood Culture adequate volume   Culture  Setup Time   Final    GRAM NEGATIVE RODS IN BOTH AEROBIC AND ANAEROBIC BOTTLES GRAM POSITIVE DIPLOCOCCI ANAEROBIC BOTTLE ONLY Gram Stain Report Called to,Read Back By and Verified With: LIGHTNER,N @ 0530 ON 1.17.19 BY BOWMAN,L CRITICAL VALUE NOTED.  VALUE IS CONSISTENT WITH PREVIOUSLY REPORTED AND CALLED VALUE. Performed at [facility], [place] [address] [place], [place] [address]    Culture PROTEUS MIRABILIS (A)  Final   Report Status PENDING  Incomplete  Culture, blood (Routine x 2)     Status: Abnormal (Preliminary result)   Collection Time: 10/05/17  2:52 PM  Result Value Ref Range Status   Specimen Description LEFT ANTECUBITAL  Final   Special Requests   Final    BOTTLES DRAWN AEROBIC AND ANAEROBIC Blood Culture results may not be optimal due to an inadequate volume of blood received in culture bottles   Culture  Setup Time   Final    GRAM NEGATIVE RODS ANAEROBIC BOTTLE ONLY GRAM POSITIVE DIPLOCOCCI ANAEROBIC BOTTLE ONLY Gram Stain Report Called to,Read Back By and Verified With: LIGHTNER,N @ 0530 ON 1.17.19 BY BOWMAN,L CRITICAL RESULT CALLED TO, READ BACK BY AND VERIFIED WITH: PHARMD A POOLE [id number] 35 M WILSON GRAM POSITIVE COCCI AEB Gram Stain Report Called to,Read Back By and Verified With: MAYES @ 1340  ON [id number] BY HENDERSON L.    Culture (A)  Final    STAPHYLOCOCCUS SPECIES (COAGULASE NEGATIVE) CULTURE  REINCUBATED FOR BETTER GROWTH PROTEUS MIRABILIS    Report Status PENDING  Incomplete   Organism ID, Bacteria PROTEUS MIRABILIS  Final      Susceptibility   Proteus mirabilis - MIC*    AMPICILLIN <=2 SENSITIVE Sensitive     CEFAZOLIN <=4 SENSITIVE Sensitive     CEFEPIME <=1 SENSITIVE Sensitive     CEFTAZIDIME <=1 SENSITIVE Sensitive     CEFTRIAXONE <=1 SENSITIVE Sensitive     CIPROFLOXACIN >=4 RESISTANT Resistant     GENTAMICIN <=1 SENSITIVE Sensitive     IMIPENEM 1 SENSITIVE Sensitive     TRIMETH/SULFA <=20 SENSITIVE Sensitive     AMPICILLIN/SULBACTAM <=2 SENSITIVE Sensitive     PIP/TAZO <=4 SENSITIVE Sensitive     * PROTEUS MIRABILIS  Blood Culture ID Panel (Reflexed)  Status: Abnormal   Collection Time: 10/05/17  2:52 PM  Result Value Ref Range Status   Enterococcus species NOT DETECTED NOT DETECTED Final   Listeria monocytogenes NOT DETECTED NOT DETECTED Final   Staphylococcus species NOT DETECTED NOT DETECTED Final   Staphylococcus aureus NOT DETECTED NOT DETECTED Final   Streptococcus species NOT DETECTED NOT DETECTED Final   Streptococcus agalactiae NOT DETECTED NOT DETECTED Final   Streptococcus pneumoniae NOT DETECTED NOT DETECTED Final   Streptococcus pyogenes NOT DETECTED NOT DETECTED Final   Acinetobacter baumannii NOT DETECTED NOT DETECTED Final   Enterobacteriaceae species DETECTED (A) NOT DETECTED Final    Comment: Enterobacteriaceae represent a large family of gram-negative bacteria, not a single organism. CRITICAL RESULT CALLED TO, READ BACK BY AND VERIFIED WITH: LIrma Newness.D. 11:00 10/06/17 ([email or web address])    Enterobacter cloacae complex NOT DETECTED NOT DETECTED Final   Escherichia coli NOT DETECTED NOT DETECTED Final   Klebsiella oxytoca NOT DETECTED NOT DETECTED Final   Klebsiella pneumoniae NOT DETECTED NOT DETECTED Final   Proteus species DETECTED (A) NOT DETECTED Final    Comment: CRITICAL RESULT CALLED TO, READ BACK BY AND VERIFIED WITH: LIrma Newness.D. 11:00 10/06/17 ([email or web address])    Serratia marcescens NOT DETECTED NOT DETECTED Final   Carbapenem resistance NOT DETECTED NOT DETECTED Final   Haemophilus influenzae NOT DETECTED NOT DETECTED Final   Neisseria meningitidis NOT DETECTED NOT DETECTED Final   Pseudomonas aeruginosa NOT DETECTED NOT DETECTED Final   Candida albicans NOT DETECTED NOT DETECTED Final   Candida glabrata NOT DETECTED NOT DETECTED Final   Candida krusei NOT DETECTED NOT DETECTED Final   Candida parapsilosis NOT DETECTED NOT DETECTED Final   Candida tropicalis NOT DETECTED NOT DETECTED Final  Urine culture     Status: Abnormal   Collection Time: 10/05/17  3:40 PM  Result Value Ref Range Status   Specimen Description URINE, CLEAN CATCH  Final   Special Requests NONE  Final   Culture >=100,000 COLONIES/mL PROTEUS MIRABILIS (A)  Final   Report Status 10/08/2017 FINAL  Final   Organism ID, Bacteria PROTEUS MIRABILIS (A)  Final      Susceptibility   Proteus mirabilis - MIC*    AMPICILLIN <=2 SENSITIVE Sensitive     CEFAZOLIN 8 SENSITIVE Sensitive     CEFTRIAXONE <=1 SENSITIVE Sensitive     CIPROFLOXACIN >=4 RESISTANT Resistant     GENTAMICIN <=1 SENSITIVE Sensitive     IMIPENEM 2 SENSITIVE Sensitive     NITROFURANTOIN 128 RESISTANT Resistant     TRIMETH/SULFA <=20 SENSITIVE Sensitive     AMPICILLIN/SULBACTAM <=2 SENSITIVE Sensitive     PIP/TAZO <=4 SENSITIVE Sensitive     * >=100,000 COLONIES/mL PROTEUS MIRABILIS  MRSA PCR Screening     Status: None   Collection Time: 10/05/17 10:51 PM  Result Value Ref Range Status   MRSA by PCR NEGATIVE NEGATIVE Final    Comment:        The GeneXpert MRSA Assay (FDA approved for NASAL specimens only), is one component of a comprehensive MRSA colonization surveillance program. It is not intended to diagnose MRSA infection nor to guide or monitor treatment for MRSA infections.     Studies: No results found. Scheduled Meds: . aspirin  300 mg Rectal Daily   Or    . aspirin  325 mg Oral Daily  . enoxaparin (LOVENOX) injection  40 mg Subcutaneous Q24H  . famotidine  20 mg Oral Daily  . feeding supplement (ENSURE ENLIVE)  237 mL  Oral BID BM  . pneumococcal 23 valent vaccine  0.5 mL Intramuscular Tomorrow-1000   Continuous Infusions: . cefTRIAXone (ROCEPHIN)  IV Stopped (10/08/17 1429)    Principal Problem:   Sepsis secondary to UTI ([place]) Active Problems:   Schizophrenia ([place])   Hyponatremia   Neurogenic bladder   Neurogenic bowel   Quadriplegia (HCC)   AKI (acute kidney injury) ([place])   HCAP (healthcare-associated pneumonia)   Pressure injury of skin   Fever in adult   Gross hematuria   Urinary tract infection with hematuria  Time spent:   Irwin Brakeman, MD, FAAFP [facility] Pager [phone]  If 7PM-7AM, please contact night-coverage [email or web address] Password [email or web address] 10/09/2017, 6:29 AM    LOS: 4 days

## 2017-10-10 DIAGNOSIS — F209 Schizophrenia, unspecified: Secondary | ICD-10-CM | POA: Diagnosis not present

## 2017-10-10 DIAGNOSIS — K592 Neurogenic bowel, not elsewhere classified: Secondary | ICD-10-CM | POA: Diagnosis not present

## 2017-10-10 DIAGNOSIS — E871 Hypo-osmolality and hyponatremia: Secondary | ICD-10-CM | POA: Diagnosis not present

## 2017-10-10 DIAGNOSIS — N319 Neuromuscular dysfunction of bladder, unspecified: Secondary | ICD-10-CM | POA: Diagnosis not present

## 2017-10-10 DIAGNOSIS — J189 Pneumonia, unspecified organism: Secondary | ICD-10-CM | POA: Diagnosis not present

## 2017-10-10 DIAGNOSIS — R279 Unspecified lack of coordination: Secondary | ICD-10-CM | POA: Diagnosis not present

## 2017-10-10 DIAGNOSIS — Z7401 Bed confinement status: Secondary | ICD-10-CM | POA: Diagnosis not present

## 2017-10-10 DIAGNOSIS — R319 Hematuria, unspecified: Secondary | ICD-10-CM | POA: Diagnosis not present

## 2017-10-10 DIAGNOSIS — R310 Gross hematuria: Secondary | ICD-10-CM | POA: Diagnosis not present

## 2017-10-10 DIAGNOSIS — A419 Sepsis, unspecified organism: Secondary | ICD-10-CM | POA: Diagnosis not present

## 2017-10-10 DIAGNOSIS — N179 Acute kidney failure, unspecified: Secondary | ICD-10-CM | POA: Diagnosis not present

## 2017-10-10 DIAGNOSIS — N390 Urinary tract infection, site not specified: Secondary | ICD-10-CM | POA: Diagnosis not present

## 2017-10-10 DIAGNOSIS — R509 Fever, unspecified: Secondary | ICD-10-CM | POA: Diagnosis not present

## 2017-10-10 DIAGNOSIS — G8250 Quadriplegia, unspecified: Secondary | ICD-10-CM | POA: Diagnosis not present

## 2017-10-10 MED ORDER — ACETAMINOPHEN 325 MG PO TABS: 650.0000 mg | ORAL_TABLET | Freq: Four times a day (QID) | ORAL | Status: AC | PRN

## 2017-10-10 MED ORDER — ALPRAZOLAM 0.25 MG PO TABS: 0.2500 mg | ORAL_TABLET | Freq: Two times a day (BID) | ORAL | 0 refills | Status: DC | PRN

## 2017-10-10 MED ORDER — SENNOSIDES-DOCUSATE SODIUM 8.6-50 MG PO TABS: 2.0000 | ORAL_TABLET | Freq: Two times a day (BID) | ORAL | Status: AC

## 2017-10-10 MED ORDER — HYDROCODONE-ACETAMINOPHEN 5-325 MG PO TABS: 1.0000 | ORAL_TABLET | Freq: Four times a day (QID) | ORAL | 0 refills | Status: DC | PRN

## 2017-10-10 MED ORDER — CEPHALEXIN 500 MG PO CAPS: 500.0000 mg | ORAL_CAPSULE | Freq: Three times a day (TID) | ORAL | 0 refills | Status: AC

## 2017-10-10 NOTE — Discharge Instructions (Signed)
You've Been Prescribed an Antibiotic in the Hospital for an Infection You've Been Prescribed an Antibiotic in the Hospital for an Infection Your healthcare team has decided you or your loved one has an infection that requires antibiotics, or needs antibiotics to prevent an infection in certain circumstances, such as before surgery. Antibiotics save lives, and they are critical tools for treating a number of common infections, such as pneumonia, and for life-threatening conditions such as sepsis. They need to be used properly because they can cause side effects and lead to antibiotic resistance. But when antibiotics are needed, the benefits outweigh the risks of side effects or antibiotic resistance. There are some important things you should know about your antibiotic treatment.  Your healthcare team may run tests before you start taking an antibiotic. ? Your team may take samples (from your blood, urine or other areas) to run tests to look for bacteria. These tests can be important to determine if you need an antibiotic at all and, if you do, which antibiotic will work best.  After a few days of treatment, your healthcare team might change, or even stop, your antibiotic. ? While they are working to find out what is making you sick, your team has started you on an antibiotic ? If test results show a different antibiotic would be better to treat your infection, they will change your antibiotic. ? Your team may review antibiotic therapy 48 to 72 hours after it is started based on your clinical condition and microbiology culture results, and stop or change antibiotic orders as needed--an important step in your care ? In some cases, once your team has more information, they might decide that you do not need an antibiotic at all. They may find out that you dont have an infection, or that the antibiotic youre taking wont work against your infection. For example, an infection caused by a virus cant be  treated with antibiotics. Staying on an antibiotic when you dont need it wont help you and the side effects could still hurt you.  You may experience side effects from your antibiotic. ? Like all medications, antibiotics have side effects. Some of these can be serious. ? Let your healthcare team know if you have any known allergies when you are admitted to the hospital. ? Common side effects of antibiotics can include rash, dizziness, nausea, yeast infections, and diarrhea. ? The most serious side effects include Clostridium difficile infection (also called C. difficile or C. diff) and life-threatening allergic reactions. C. difficile causes diarrhea that can lead to severe colon damage and death ? Diarrhea caused by C. difficile can be serious and must be recognized and treated quickly. When you are taking an antibiotic and you develop diarrhea, let your healthcare team know immediately. ? The risk of getting C. difficile diarrhea can last for up to several months even after you are no longer getting antibiotics. You should let your healthcare team know if you develop diarrhea even after you are no longer getting an antibiotic.  As a patient or caregiver, it is important to understand your or your loved one's antibiotic treatment. It is especially important for caregivers to speak up when patients can't speak for themselves. Here are some important questions to ask your healthcare team. ? What infection is this antibiotic treating and how do you know I have that infection? ? Is the antibiotic being prescribed the most targeted to treat the infection while causing the least side effects? ? What side effects might occur  from this antibiotic? ? How long will I need to take this antibiotic? ? Is it safe to take this antibiotic with other medications or supplements (e.g., vitamins) that I am taking? ? Are there any special directions I need to know about taking this antibiotic? For example, should I  take it with food? ? How will I be monitored to know whether my infection is responding to the antibiotic? ? What tests may help to make sure the right antibiotic is prescribed for me?  Remember, antibiotics are life-saving drugs and they need to be used properly. If you have any questions about your antibiotics, please talk to your healthcare team. To learn more about antibiotic prescribing and use, visit [employer]. CDC - Be Antibiotics Aware: Smart Use, Best Care, 12/2016. This information is not intended to replace advice given to you by your health care provider. Make sure you discuss any questions you have with your health care provider. Document Released: 01/13/2017 Document Revised: 01/13/2017 Document Reviewed: 01/13/2017 Elsevier Interactive Patient Education  [employer].

## 2017-10-10 NOTE — Plan of Care (Signed)
Patient had an uneventful night. Patient's vitals remain stable. He had a low grade temperature of 100.3 last night, but his temp is now down to 98.6. He has been voiding appropriately.

## 2017-10-10 NOTE — Care Management Important Message (Signed)
Important Message  Patient Details  Name: MARQUINN MESCHKE MRN: [id number] Date of Birth: November 30, 1945   Medicare Important Message Given:  Yes    Jernee Murtaugh, Chauncey Reading, RN 10/10/2017, 10:09 AM

## 2017-10-10 NOTE — Progress Notes (Signed)
Patient refused assessment this AM - flow sheet completed as able.  Patient refuses turning and foley care at this time, too.  Wondering when he is being discharged - told patient it would be today, but need to wait on the OK to send back to [facility] by SW.  Patient appears to be in NAD.  Will continue to monitor

## 2017-10-10 NOTE — Discharge Summary (Signed)
Physician Discharge Summary  Dean Oliver VQQ:[id number] DOB: 03-23-1946 DOA: 10/05/2017  PCP: Alger Simons  Admit date: 10/05/2017 Discharge date: 10/10/2017  Admitted From: SNF Disposition: SNF Recommendations for Outpatient Follow-up:  1. Follow up with PCP in 2 weeks 2. Please continue cephalexin for 14 days, then DISCONTINUE 3. Please maintain standard foley catheter care and change out regularly to prevent infection 4. Please obtain BMP/CBC in one week 5. Please follow up on the following pending results: final culture data  Discharge Condition: STABLE   CODE STATUS: FULL    Brief Hospitalization Summary: Please see all hospital notes, images, labs for full details of the hospitalization.  HPI: Dean Oliver is a 72 y.o. male with medical history of quadriplegia, schizophrenia chronic foley cath who presents with a fever of 102 and bloody urine after a catheter change today. At baseline, he is alert and communicative but his cousins who are giving me a history state that he has been lethargic. Of note, CT scan suggests a pneumonia. His cousin noted him coughing today. He was also in close proximity and was drinking sharing utensils with a person who has been diagnosed with a pneumonia and has been hospitalized.  In the ER, the cousins also notice a left facial droop which they have not noticed before.  ED Course: Vanc and Zosyn given CT renal stone study shows lung infiltrates- no renal stones or blood clots.   Brief Admission Hx: Dean Oliver a 71 y.o.malewith medical history ofquadriplegia, schizophrenia chronic foley cathwho presents with a feverof 102and bloody urine after a catheter change today.At baseline, he is alert and communicative but his cousins who are giving me a history state that he has been lethargic. Of note, CT scan suggests a pneumonia. His cousin noted him coughing today. He was also in close proximity and was drinking sharing utensils with a  person who has been diagnosed with a pneumonia and has been hospitalized.  In the ER, the cousins also notice a left facial droop which they have not noticed before.  MDM/Assessment & Plan:   1. Severe sepsis - RESOLVED NOW.  Treated with IV antibiotics and supportive care, lactate trending down, de-escalated antibiotics as clinically improving.  Discharge on oral cephalexin x 14 days.  2. Sinus tachycardia - RESOLVED NOW.  secondary to severe sepsis, treated as above.   3. Elevated troponin - secondary to demand ischemia. Stable. No chest pain symptoms.  4. Proteus mirabilis UTI - treated with IV ceftriaxone 2 gm every 24 hours.  5. Proteus mirabilis Bacteremia - Proteus species reported, Treated with IV ceftriaxone, discharge on 14 days of oral cephalexin.    6. Left facial droop - resolved now.  MRI negative for acute findings.   7. Hematuria - resolved now.  likely from recent catheter replacement.  8. Schizophrenia - resume home medications.  9. Quadriplegia with neurogenic bladder - continue chronic foley, baclofen and tizanidine. 10. Anxiety / Depression - continue xanax/respirdone.  11. Constipation - Enema ordered per patient request with multiple BMs reported.   DVT prophylaxis: lovenox Code Status: Full  Family Communication: none Disposition Plan: SNF 1/21  Discharge Diagnoses:  Principal Problem:   Sepsis secondary to UTI [facility]) Active Problems:   Schizophrenia ([place])   Hyponatremia   Neurogenic bladder   Neurogenic bowel   Quadriplegia (HCC)   AKI (acute kidney injury) ([place])   HCAP (healthcare-associated pneumonia)   Pressure injury of skin   Fever in adult   Gross hematuria  Urinary tract infection with hematuria  Discharge Instructions: Discharge Instructions    Increase activity slowly   Complete by:  As directed      Allergies as of 10/10/2017   No Known Allergies     Medication List    STOP taking these medications   sulfamethoxazole-trimethoprim  800-160 MG tablet Commonly known as:  BACTRIM DS,SEPTRA DS     TAKE these medications   acetaminophen 325 MG tablet Commonly known as:  TYLENOL Take 2 tablets (650 mg total) by mouth every 6 (six) hours as needed for mild pain, moderate pain or fever.   ALPRAZolam 0.25 MG tablet Commonly known as:  XANAX Take 1 tablet (0.25 mg total) by mouth 2 (two) times daily as needed for anxiety.   aspirin 81 MG EC tablet Take 1 tablet (81 mg total) by mouth daily.   Baclofen 5 MG Tabs Take 5 mg by mouth 2 (two) times daily.   cephALEXin 500 MG capsule Commonly known as:  KEFLEX Take 1 capsule (500 mg total) by mouth 3 (three) times daily for 14 days.   CERTAVITE SENIOR/ANTIOXIDANT Tabs Take 1 tablet by mouth daily.   clotrimazole 1 % cream Commonly known as:  LOTRIMIN Apply 1 application topically 2 (two) times daily. 14  Day course to left cheek for ring worm starting on 09/28/2017   ferrous sulfate 325 (65 FE) MG tablet Take 325 mg by mouth daily with breakfast.   HYDROcodone-acetaminophen 5-325 MG tablet Commonly known as:  NORCO/VICODIN Take 1 tablet by mouth every 6 (six) hours as needed for moderate pain. What changed:  how much to take   risperiDONE 0.5 MG tablet Commonly known as:  RISPERDAL Take 1 tablet (0.5 mg total) by mouth 2 (two) times daily.   senna-docusate 8.6-50 MG tablet Commonly known as:  Senokot-S Take 2 tablets by mouth 2 (two) times daily.   sodium phosphate 7-19 GM/118ML Enem Place 133 mLs (1 enema total) rectally daily as needed for severe constipation.   tiZANidine 4 MG tablet Commonly known as:  ZANAFLEX Take 1 tablet (4 mg total) by mouth every 6 (six) hours as needed for muscle spasms.   vitamin C 500 MG tablet Commonly known as:  ASCORBIC ACID Take 500 mg by mouth daily.      Contact information for after-discharge care    Destination    [facility] Follow up.   Service:  Skilled Nursing Contact information: [place] [phone]             No Known Allergies Allergies as of 10/10/2017   No Known Allergies     Medication List    STOP taking these medications   sulfamethoxazole-trimethoprim 800-160 MG tablet Commonly known as:  BACTRIM DS,SEPTRA DS     TAKE these medications   acetaminophen 325 MG tablet Commonly known as:  TYLENOL Take 2 tablets (650 mg total) by mouth every 6 (six) hours as needed for mild pain, moderate pain or fever.   ALPRAZolam 0.25 MG tablet Commonly known as:  XANAX Take 1 tablet (0.25 mg total) by mouth 2 (two) times daily as needed for anxiety.   aspirin 81 MG EC tablet Take 1 tablet (81 mg total) by mouth daily.   Baclofen 5 MG Tabs Take 5 mg by mouth 2 (two) times daily.   cephALEXin 500 MG capsule Commonly known as:  KEFLEX Take 1 capsule (500 mg total) by mouth 3 (three) times daily for  14 days.   CERTAVITE SENIOR/ANTIOXIDANT Tabs Take 1 tablet by mouth daily.   clotrimazole 1 % cream Commonly known as:  LOTRIMIN Apply 1 application topically 2 (two) times daily. 14  Day course to left cheek for ring worm starting on 09/28/2017   ferrous sulfate 325 (65 FE) MG tablet Take 325 mg by mouth daily with breakfast.   HYDROcodone-acetaminophen 5-325 MG tablet Commonly known as:  NORCO/VICODIN Take 1 tablet by mouth every 6 (six) hours as needed for moderate pain. What changed:  how much to take   risperiDONE 0.5 MG tablet Commonly known as:  RISPERDAL Take 1 tablet (0.5 mg total) by mouth 2 (two) times daily.   senna-docusate 8.6-50 MG tablet Commonly known as:  Senokot-S Take 2 tablets by mouth 2 (two) times daily.   sodium phosphate 7-19 GM/118ML Enem Place 133 mLs (1 enema total) rectally daily as needed for severe constipation.   tiZANidine 4 MG tablet Commonly known as:  ZANAFLEX Take 1 tablet (4 mg total) by mouth every 6 (six) hours as needed for muscle spasms.   vitamin C 500 MG  tablet Commonly known as:  ASCORBIC ACID Take 500 mg by mouth daily.       Procedures/Studies: Ct Head Wo Contrast  Result Date: 10/05/2017 CLINICAL DATA:  Quadriplegia.  Fever.  Lethargy.  Ataxia. EXAM: CT HEAD WITHOUT CONTRAST TECHNIQUE: Contiguous axial images were obtained from the base of the skull through the vertex without intravenous contrast. COMPARISON:  08/13/2017 head CT. FINDINGS: Brain: No evidence of parenchymal hemorrhage or extra-axial fluid collection. No mass lesion, mass effect, or midline shift. No CT evidence of acute infarction. Mild generalized cerebral volume loss. No ventriculomegaly. Vascular: No acute abnormality. Skull: No evidence of calvarial fracture. Sinuses/Orbits: The visualized paranasal sinuses are essentially clear. Other:  The mastoid air cells are unopacified. IMPRESSION: 1.  No evidence of acute intracranial abnormality. 2. Mild generalized cerebral volume loss. Electronically Signed   By: Ilona Sorrel M.D.   On: 10/05/2017 20:48   Mr Brain Wo Contrast  Result Date: 10/06/2017 CLINICAL DATA:  Quadriplegic patient with altered mental status over the last 2 days. EXAM: MRI HEAD WITHOUT CONTRAST TECHNIQUE: Multiplanar, multiecho pulse sequences of the brain and surrounding structures were obtained without intravenous contrast. COMPARISON:  CT 10/05/2017.  MRI 08/13/2017. FINDINGS: Brain: Diffusion imaging does not show any acute or subacute infarction. The brainstem and cerebellum are normal. Cerebral hemispheres are normal except for a tiny cyst or old lacunar infarction in the left basal ganglia. No evidence of widespread ischemic changes. No large vessel insult. No mass lesion, hemorrhage, hydrocephalus or extra-axial collection. Vascular: Major vessels at the base of the brain show flow. Skull and upper cervical spine: Negative except for possible myelomalacia at the C2-3 cord. Sinuses/Orbits: Clear/normal Other: None IMPRESSION: No acute intracranial finding.  Normal except for a tiny cyst or lacunar infarction in the left basal ganglia. Electronically Signed   By: Nelson Chimes M.D.   On: 10/06/2017 10:47   Dg Chest Port 1 View  Result Date: 10/05/2017 CLINICAL DATA:  72 year old male code sepsis.  Fever and hematuria. EXAM: PORTABLE CHEST 1 VIEW COMPARISON:  08/12/2017 portable chest. FINDINGS: Portable AP upright view at 1557 hrs. Similar low lung volumes. Mediastinal contours are stable and within normal limits. No pneumothorax, pulmonary edema, pleural effusion or consolidation. Mild perihilar crowding of of lung markings with no convincing airspace opacity. Mild gaseous distension of bowel in the visible left abdomen. IMPRESSION: Low lung volumes,  otherwise no acute cardiopulmonary abnormality. Electronically Signed   By: Genevie Ann M.D.   On: 10/05/2017 16:15   Ct Renal Stone Study  Result Date: 10/05/2017 CLINICAL DATA:  Gross hematuria.  Fever of unknown origin. EXAM: CT ABDOMEN AND PELVIS WITHOUT CONTRAST TECHNIQUE: Multidetector CT imaging of the abdomen and pelvis was performed following the standard protocol without IV contrast. COMPARISON:  None. FINDINGS: Lower chest: The patient has bilateral patchy lower lobe pulmonary infiltrates as well as a small area of infiltrate in the lingula. No effusions. Coronary artery calcification. Heart size is normal. Hepatobiliary: 17 mm low-density lesion in the posterior aspect of the right lobe of the liver consistent with a cyst. Liver parenchyma is otherwise normal. No biliary ductal dilatation. Pancreas: The pancreas is not well-defined but there is no discrete abnormality. Spleen: Normal. Adrenals/Urinary Tract: The kidneys and adrenal glands appear normal. No hydronephrosis. A Foley catheter is in place. There is diffuse thickening of the bladder wall without a focal mass. Stomach/Bowel: Prominent stool in the rectum. Slight gaseous distention of the colon. Moderate stool in the descending colon. No free air.  No dilated small bowel. Vascular/Lymphatic: No significant vascular findings are present. No enlarged abdominal or pelvic lymph nodes. Reproductive: Prostate is unremarkable. Other: There is a small amount of ascites in the abdomen and pelvis. Anasarca. Musculoskeletal: No acute or significant osseous findings. IMPRESSION: 1. Patchy bilateral lower lobe infiltrates. 2. Diffuse thickening of the bladder wall with Foley catheter in place. No mass or hydronephrosis. 3. Prominent stool in the rectum and descending colon with gaseous distention of the colon. 4. Small amount of ascites.  Anasarca. Electronically Signed   By: Lorriane Shire M.D.   On: 10/05/2017 18:05      Subjective: Pt without complaints today.  He is ready to go back to SNF.    Discharge Exam: Vitals:   10/09/17 2117 10/10/17 0518  BP: (!) 141/81 132/82  Pulse: 94 77  Resp: 18 18  Temp: 100.3 F (37.9 C) 98.6 F (37 C)  SpO2: 100% 100%   Vitals:   10/09/17 0515 10/09/17 1322 10/09/17 2117 10/10/17 0518  BP: (!) 150/84 (!) 141/79 (!) 141/81 132/82  Pulse: 95 87 94 77  Resp: 18 18 18 18   Temp: 99.9 F (37.7 C) 99 F (37.2 C) 100.3 F (37.9 C) 98.6 F (37 C)  TempSrc: Oral Oral Oral Oral  SpO2: 99% 98% 100% 100%  Weight:      Height:       General exam: awake, alert, NAD. Cooperative.  Respiratory system: No increased work of breathing. Cardiovascular system: S1 & S2 heard, tachycardic. No JVD, murmurs, gallops, clicks or pedal edema. Gastrointestinal system: Abdomen is nondistended, soft and nontender. Normal bowel sounds heard. Central nervous system: Alert and oriented. Quadriplegic.  Extremities: no CCE.   The results of significant diagnostics from this hospitalization (including imaging, microbiology, ancillary and laboratory) are listed below for reference.     Microbiology: Recent Results (from the past 240 hour(s))  Culture, blood (Routine x 2)     Status: Abnormal   Collection Time: 10/05/17  2:44 PM   Result Value Ref Range Status   Specimen Description BLOOD RIGHT FOREARM  Final   Special Requests   Final    BOTTLES DRAWN AEROBIC AND ANAEROBIC Blood Culture adequate volume   Culture  Setup Time   Final    GRAM NEGATIVE RODS IN BOTH AEROBIC AND ANAEROBIC BOTTLES Gram Stain Report Called to,Read Back By and  Verified With: LIGHTNER,N @ 0530 ON 1.17.19 BY BOWMAN,L CRITICAL VALUE NOTED.  VALUE IS CONSISTENT WITH PREVIOUSLY REPORTED AND CALLED VALUE. CORRECTED RESULTS PREVIOUSLY REPORTED AS: GRAM POSITIVE DIPLOCOCCI ANAEROBIC BOTTLE ONLY CORRECTED RESULTS CALLED TO: C MORRIS,RN AT 1523 10/09/17 BY L BENFIELD    Culture (A)  Final    PROTEUS MIRABILIS SUSCEPTIBILITIES PERFORMED ON PREVIOUS CULTURE WITHIN THE LAST 5 DAYS. Performed at [facility], [place] [address], [place],  [address]    Report Status 10/09/2017 FINAL  Final  Culture, blood (Routine x 2)     Status: Abnormal   Collection Time: 10/05/17  2:52 PM  Result Value Ref Range Status   Specimen Description LEFT ANTECUBITAL  Final   Special Requests   Final    BOTTLES DRAWN AEROBIC AND ANAEROBIC Blood Culture results may not be optimal due to an inadequate volume of blood received in culture bottles   Culture  Setup Time   Final    GRAM NEGATIVE RODS ANAEROBIC BOTTLE ONLY GRAM POSITIVE DIPLOCOCCI ANAEROBIC BOTTLE ONLY Gram Stain Report Called to,Read Back By and Verified With: LIGHTNER,N @ 0530 ON 1.17.19 BY BOWMAN,L CRITICAL RESULT CALLED TO, READ BACK BY AND VERIFIED WITH: PHARMD A POOLE [id number] M WILSON GRAM POSITIVE COCCI AEB Gram Stain Report Called to,Read Back By and Verified With: MAYES @ 1340  ON [id number] BY HENDERSON L.    Culture (A)  Final    STAPHYLOCOCCUS SPECIES (COAGULASE NEGATIVE) THE SIGNIFICANCE OF ISOLATING THIS ORGANISM FROM A SINGLE SET OF BLOOD CULTURES WHEN MULTIPLE SETS ARE DRAWN IS UNCERTAIN. PLEASE NOTIFY THE MICROBIOLOGY DEPARTMENT WITHIN ONE WEEK IF SPECIATION AND SENSITIVITIES ARE  REQUIRED. PROTEUS MIRABILIS    Report Status 10/09/2017 FINAL  Final   Organism ID, Bacteria PROTEUS MIRABILIS  Final      Susceptibility   Proteus mirabilis - MIC*    AMPICILLIN <=2 SENSITIVE Sensitive     CEFAZOLIN <=4 SENSITIVE Sensitive     CEFEPIME <=1 SENSITIVE Sensitive     CEFTAZIDIME <=1 SENSITIVE Sensitive     CEFTRIAXONE <=1 SENSITIVE Sensitive     CIPROFLOXACIN >=4 RESISTANT Resistant     GENTAMICIN <=1 SENSITIVE Sensitive     IMIPENEM 1 SENSITIVE Sensitive     TRIMETH/SULFA <=20 SENSITIVE Sensitive     AMPICILLIN/SULBACTAM <=2 SENSITIVE Sensitive     PIP/TAZO <=4 SENSITIVE Sensitive     * PROTEUS MIRABILIS  Blood Culture ID Panel (Reflexed)     Status: Abnormal   Collection Time: 10/05/17  2:52 PM  Result Value Ref Range Status   Enterococcus species NOT DETECTED NOT DETECTED Final   Listeria monocytogenes NOT DETECTED NOT DETECTED Final   Staphylococcus species NOT DETECTED NOT DETECTED Final   Staphylococcus aureus NOT DETECTED NOT DETECTED Final   Streptococcus species NOT DETECTED NOT DETECTED Final   Streptococcus agalactiae NOT DETECTED NOT DETECTED Final   Streptococcus pneumoniae NOT DETECTED NOT DETECTED Final   Streptococcus pyogenes NOT DETECTED NOT DETECTED Final   Acinetobacter baumannii NOT DETECTED NOT DETECTED Final   Enterobacteriaceae species DETECTED (A) NOT DETECTED Final    Comment: Enterobacteriaceae represent a large family of gram-negative bacteria, not a single organism. CRITICAL RESULT CALLED TO, READ BACK BY AND VERIFIED WITH: LIrma Newness.D. 11:00 10/06/17 ([email or web address])    Enterobacter cloacae complex NOT DETECTED NOT DETECTED Final   Escherichia coli NOT DETECTED NOT DETECTED Final   Klebsiella oxytoca NOT DETECTED NOT DETECTED Final   Klebsiella pneumoniae NOT DETECTED NOT DETECTED Final  Proteus species DETECTED (A) NOT DETECTED Final    Comment: CRITICAL RESULT CALLED TO, READ BACK BY AND VERIFIED WITH: LIrma Newness.D. 11:00  10/06/17 ([email or web address])    Serratia marcescens NOT DETECTED NOT DETECTED Final   Carbapenem resistance NOT DETECTED NOT DETECTED Final   Haemophilus influenzae NOT DETECTED NOT DETECTED Final   Neisseria meningitidis NOT DETECTED NOT DETECTED Final   Pseudomonas aeruginosa NOT DETECTED NOT DETECTED Final   Candida albicans NOT DETECTED NOT DETECTED Final   Candida glabrata NOT DETECTED NOT DETECTED Final   Candida krusei NOT DETECTED NOT DETECTED Final   Candida parapsilosis NOT DETECTED NOT DETECTED Final   Candida tropicalis NOT DETECTED NOT DETECTED Final  Urine culture     Status: Abnormal   Collection Time: 10/05/17  3:40 PM  Result Value Ref Range Status   Specimen Description URINE, CLEAN CATCH  Final   Special Requests NONE  Final   Culture >=100,000 COLONIES/mL PROTEUS MIRABILIS (A)  Final   Report Status 10/08/2017 FINAL  Final   Organism ID, Bacteria PROTEUS MIRABILIS (A)  Final      Susceptibility   Proteus mirabilis - MIC*    AMPICILLIN <=2 SENSITIVE Sensitive     CEFAZOLIN 8 SENSITIVE Sensitive     CEFTRIAXONE <=1 SENSITIVE Sensitive     CIPROFLOXACIN >=4 RESISTANT Resistant     GENTAMICIN <=1 SENSITIVE Sensitive     IMIPENEM 2 SENSITIVE Sensitive     NITROFURANTOIN 128 RESISTANT Resistant     TRIMETH/SULFA <=20 SENSITIVE Sensitive     AMPICILLIN/SULBACTAM <=2 SENSITIVE Sensitive     PIP/TAZO <=4 SENSITIVE Sensitive     * >=100,000 COLONIES/mL PROTEUS MIRABILIS  MRSA PCR Screening     Status: None   Collection Time: 10/05/17 10:51 PM  Result Value Ref Range Status   MRSA by PCR NEGATIVE NEGATIVE Final    Comment:        The GeneXpert MRSA Assay (FDA approved for NASAL specimens only), is one component of a comprehensive MRSA colonization surveillance program. It is not intended to diagnose MRSA infection nor to guide or monitor treatment for MRSA infections.      Labs: BNP (last 3 results) No results for input(s): BNP in the last 8760 hours. Basic  Metabolic Panel: Recent Labs  Lab 10/05/17 1444 10/06/17 0825 10/07/17 0435 10/08/17 1509 10/09/17 0650  NA 123* 132* 130* 131* 134*  K 5.0 3.2* 3.8 4.0 3.7  CL 89* 100* 103 103 104  CO2 20* 22 19* 20* 21*  GLUCOSE 170* 105* 109* 144* 115*  BUN 43* 26* 23* 14 10  CREATININE 1.66* 0.84 0.63 0.39* 0.32*  CALCIUM 9.2 8.5* 8.1* 7.8* 8.2*   Liver Function Tests: Recent Labs  Lab 10/05/17 1444 10/06/17 0825 10/07/17 0435 10/08/17 1509 10/09/17 0650  AST 34 32 45* 55* 32  ALT 24 27 33 54 46  ALKPHOS 55 52 60 69 65  BILITOT 1.5* 1.3* 1.0 0.4 0.6  PROT 6.9 5.5* 5.7* 5.4* 5.3*  ALBUMIN 2.8* 2.3* 2.3* 2.1* 2.1*   No results for input(s): LIPASE, AMYLASE in the last 168 hours. No results for input(s): AMMONIA in the last 168 hours. CBC: Recent Labs  Lab 10/05/17 1444 10/06/17 0825 10/07/17 0435 10/08/17 1509 10/09/17 0650  WBC 6.5 6.5 8.0 7.6 8.2  NEUTROABS 4.9  --  7.1 5.9 6.1  HGB 11.8* 9.0* 8.6* 8.1* 8.4*  HCT 36.1* 27.8* 26.6* 25.6* 26.3*  MCV 82.4 84.8 83.9 85.9 84.0  PLT 300 228 241  254 261   Cardiac Enzymes: Recent Labs  Lab 10/05/17 1444 10/06/17 0858  TROPONINI 0.10* 1.60*   BNP: Invalid input(s): POCBNP CBG: No results for input(s): GLUCAP in the last 168 hours. D-Dimer No results for input(s): DDIMER in the last 72 hours. Hgb A1c No results for input(s): HGBA1C in the last 72 hours. Lipid Profile No results for input(s): CHOL, HDL, LDLCALC, TRIG, CHOLHDL, LDLDIRECT in the last 72 hours. Thyroid function studies No results for input(s): TSH, T4TOTAL, T3FREE, THYROIDAB in the last 72 hours.  Invalid input(s): FREET3 Anemia work up No results for input(s): VITAMINB12, FOLATE, FERRITIN, TIBC, IRON, RETICCTPCT in the last 72 hours. Urinalysis    Component Value Date/Time   COLORURINE STRAW (A) 10/05/2017 1540   APPEARANCEUR HAZY (A) 10/05/2017 1540   LABSPEC 1.010 10/05/2017 1540   PHURINE 7.5 10/05/2017 1540   GLUCOSEU NEGATIVE 10/05/2017 1540    HGBUR LARGE (A) 10/05/2017 1540   BILIRUBINUR SMALL (A) 10/05/2017 1540   KETONESUR NEGATIVE 10/05/2017 1540   PROTEINUR 100 (A) 10/05/2017 1540   NITRITE NEGATIVE 10/05/2017 1540   LEUKOCYTESUR LARGE (A) 10/05/2017 1540   Sepsis Labs Invalid input(s): PROCALCITONIN,  WBC,  LACTICIDVEN Microbiology Recent Results (from the past 240 hour(s))  Culture, blood (Routine x 2)     Status: Abnormal   Collection Time: 10/05/17  2:44 PM  Result Value Ref Range Status   Specimen Description BLOOD RIGHT FOREARM  Final   Special Requests   Final    BOTTLES DRAWN AEROBIC AND ANAEROBIC Blood Culture adequate volume   Culture  Setup Time   Final    GRAM NEGATIVE RODS IN BOTH AEROBIC AND ANAEROBIC BOTTLES Gram Stain Report Called to,Read Back By and Verified With: LIGHTNER,N @ 0530 ON 1.17.19 BY BOWMAN,L CRITICAL VALUE NOTED.  VALUE IS CONSISTENT WITH PREVIOUSLY REPORTED AND CALLED VALUE. CORRECTED RESULTS PREVIOUSLY REPORTED AS: GRAM POSITIVE DIPLOCOCCI ANAEROBIC BOTTLE ONLY CORRECTED RESULTS CALLED TO: C MORRIS,RN AT 1523 10/09/17 BY L BENFIELD    Culture (A)  Final    PROTEUS MIRABILIS SUSCEPTIBILITIES PERFORMED ON PREVIOUS CULTURE WITHIN THE LAST 5 DAYS. Performed at [facility], [place] [address] [place], [place] [address]    Report Status 10/09/2017 FINAL  Final  Culture, blood (Routine x 2)     Status: Abnormal   Collection Time: 10/05/17  2:52 PM  Result Value Ref Range Status   Specimen Description LEFT ANTECUBITAL  Final   Special Requests   Final    BOTTLES DRAWN AEROBIC AND ANAEROBIC Blood Culture results may not be optimal due to an inadequate volume of blood received in culture bottles   Culture  Setup Time   Final    GRAM NEGATIVE RODS ANAEROBIC BOTTLE ONLY GRAM POSITIVE DIPLOCOCCI ANAEROBIC BOTTLE ONLY Gram Stain Report Called to,Read Back By and Verified With: LIGHTNER,N @ 0530 ON 1.17.19 BY BOWMAN,L CRITICAL RESULT CALLED TO, READ BACK BY AND VERIFIED WITH: PHARMD A  POOLE [id number] M WILSON GRAM POSITIVE COCCI AEB Gram Stain Report Called to,Read Back By and Verified With: MAYES @ 1340  ON [id number] BY HENDERSON L.    Culture (A)  Final    STAPHYLOCOCCUS SPECIES (COAGULASE NEGATIVE) THE SIGNIFICANCE OF ISOLATING THIS ORGANISM FROM A SINGLE SET OF BLOOD CULTURES WHEN MULTIPLE SETS ARE DRAWN IS UNCERTAIN. PLEASE NOTIFY THE MICROBIOLOGY DEPARTMENT WITHIN ONE WEEK IF SPECIATION AND SENSITIVITIES ARE REQUIRED. PROTEUS MIRABILIS    Report Status 10/09/2017 FINAL  Final   Organism ID, Bacteria PROTEUS MIRABILIS  Final      Susceptibility   Proteus mirabilis - MIC*    AMPICILLIN <=2 SENSITIVE Sensitive     CEFAZOLIN <=4 SENSITIVE Sensitive     CEFEPIME <=1 SENSITIVE Sensitive     CEFTAZIDIME <=1 SENSITIVE Sensitive     CEFTRIAXONE <=1 SENSITIVE Sensitive     CIPROFLOXACIN >=4 RESISTANT Resistant     GENTAMICIN <=1 SENSITIVE Sensitive     IMIPENEM 1 SENSITIVE Sensitive     TRIMETH/SULFA <=20 SENSITIVE Sensitive     AMPICILLIN/SULBACTAM <=2 SENSITIVE Sensitive     PIP/TAZO <=4 SENSITIVE Sensitive     * PROTEUS MIRABILIS  Blood Culture ID Panel (Reflexed)     Status: Abnormal   Collection Time: 10/05/17  2:52 PM  Result Value Ref Range Status   Enterococcus species NOT DETECTED NOT DETECTED Final   Listeria monocytogenes NOT DETECTED NOT DETECTED Final   Staphylococcus species NOT DETECTED NOT DETECTED Final   Staphylococcus aureus NOT DETECTED NOT DETECTED Final   Streptococcus species NOT DETECTED NOT DETECTED Final   Streptococcus agalactiae NOT DETECTED NOT DETECTED Final   Streptococcus pneumoniae NOT DETECTED NOT DETECTED Final   Streptococcus pyogenes NOT DETECTED NOT DETECTED Final   Acinetobacter baumannii NOT DETECTED NOT DETECTED Final   Enterobacteriaceae species DETECTED (A) NOT DETECTED Final    Comment: Enterobacteriaceae represent a large family of gram-negative bacteria, not a single organism. CRITICAL RESULT CALLED TO, READ BACK BY  AND VERIFIED WITH: LIrma Newness.D. 11:00 10/06/17 ([email or web address])    Enterobacter cloacae complex NOT DETECTED NOT DETECTED Final   Escherichia coli NOT DETECTED NOT DETECTED Final   Klebsiella oxytoca NOT DETECTED NOT DETECTED Final   Klebsiella pneumoniae NOT DETECTED NOT DETECTED Final   Proteus species DETECTED (A) NOT DETECTED Final    Comment: CRITICAL RESULT CALLED TO, READ BACK BY AND VERIFIED WITH: LIrma Newness.D. 11:00 10/06/17 ([email or web address])    Serratia marcescens NOT DETECTED NOT DETECTED Final   Carbapenem resistance NOT DETECTED NOT DETECTED Final   Haemophilus influenzae NOT DETECTED NOT DETECTED Final   Neisseria meningitidis NOT DETECTED NOT DETECTED Final   Pseudomonas aeruginosa NOT DETECTED NOT DETECTED Final   Candida albicans NOT DETECTED NOT DETECTED Final   Candida glabrata NOT DETECTED NOT DETECTED Final   Candida krusei NOT DETECTED NOT DETECTED Final   Candida parapsilosis NOT DETECTED NOT DETECTED Final   Candida tropicalis NOT DETECTED NOT DETECTED Final  Urine culture     Status: Abnormal   Collection Time: 10/05/17  3:40 PM  Result Value Ref Range Status   Specimen Description URINE, CLEAN CATCH  Final   Special Requests NONE  Final   Culture >=100,000 COLONIES/mL PROTEUS MIRABILIS (A)  Final   Report Status 10/08/2017 FINAL  Final   Organism ID, Bacteria PROTEUS MIRABILIS (A)  Final      Susceptibility   Proteus mirabilis - MIC*    AMPICILLIN <=2 SENSITIVE Sensitive     CEFAZOLIN 8 SENSITIVE Sensitive     CEFTRIAXONE <=1 SENSITIVE Sensitive     CIPROFLOXACIN >=4 RESISTANT Resistant     GENTAMICIN <=1 SENSITIVE Sensitive     IMIPENEM 2 SENSITIVE Sensitive     NITROFURANTOIN 128 RESISTANT Resistant     TRIMETH/SULFA <=20 SENSITIVE Sensitive     AMPICILLIN/SULBACTAM <=2 SENSITIVE Sensitive     PIP/TAZO <=4 SENSITIVE Sensitive     * >=100,000 COLONIES/mL PROTEUS MIRABILIS  MRSA PCR Screening     Status: None   Collection Time: 10/05/17 10:51 PM  Result  Value Ref Range Status   MRSA by PCR NEGATIVE NEGATIVE Final    Comment:        The GeneXpert MRSA Assay (FDA approved for NASAL specimens only), is one component of a comprehensive MRSA colonization surveillance program. It is not intended to diagnose MRSA infection nor to guide or monitor treatment for MRSA infections.    Time coordinating discharge: 40 mins  SIGNED:  Irwin Brakeman, MD  [facility] 10/10/2017, 9:05 AM Pager [phone]  If 7PM-7AM, please contact night-coverage [email or web address] Password [email or web address]

## 2017-10-10 NOTE — Progress Notes (Signed)
Patient discharging back to [facility].  Report called to [facility].  IVs removed - WNL.  Awaiting on EMS for transport - called by [place] at desk.  Patient aware and in NAD

## 2017-10-10 NOTE — Clinical Social Work Note (Addendum)
Facility aware of discharge and discharge clinicals sent.   Message left for patient's legal guardian.   Transport arranged via [facility].  LCSW signing off.      Simpson Paulos, Clydene Pugh, LCSW

## 2017-10-11 DIAGNOSIS — R1312 Dysphagia, oropharyngeal phase: Secondary | ICD-10-CM | POA: Diagnosis not present

## 2017-10-11 DIAGNOSIS — B350 Tinea barbae and tinea capitis: Secondary | ICD-10-CM | POA: Diagnosis not present

## 2017-10-11 DIAGNOSIS — R41841 Cognitive communication deficit: Secondary | ICD-10-CM | POA: Diagnosis not present

## 2017-10-11 DIAGNOSIS — M6281 Muscle weakness (generalized): Secondary | ICD-10-CM | POA: Diagnosis not present

## 2017-10-11 DIAGNOSIS — Z960 Presence of urogenital implants: Secondary | ICD-10-CM | POA: Diagnosis not present

## 2017-10-11 DIAGNOSIS — R1311 Dysphagia, oral phase: Secondary | ICD-10-CM | POA: Diagnosis not present

## 2017-10-11 DIAGNOSIS — S14122D Central cord syndrome at C2 level of cervical spinal cord, subsequent encounter: Secondary | ICD-10-CM | POA: Diagnosis not present

## 2017-10-11 DIAGNOSIS — G8250 Quadriplegia, unspecified: Secondary | ICD-10-CM | POA: Diagnosis not present

## 2017-10-11 DIAGNOSIS — J188 Other pneumonia, unspecified organism: Secondary | ICD-10-CM | POA: Diagnosis not present

## 2017-10-12 DIAGNOSIS — G8250 Quadriplegia, unspecified: Secondary | ICD-10-CM | POA: Diagnosis not present

## 2017-10-12 DIAGNOSIS — M6281 Muscle weakness (generalized): Secondary | ICD-10-CM | POA: Diagnosis not present

## 2017-10-12 DIAGNOSIS — G894 Chronic pain syndrome: Secondary | ICD-10-CM | POA: Diagnosis not present

## 2017-10-12 DIAGNOSIS — R41841 Cognitive communication deficit: Secondary | ICD-10-CM | POA: Diagnosis not present

## 2017-10-12 DIAGNOSIS — M6249 Contracture of muscle, multiple sites: Secondary | ICD-10-CM | POA: Diagnosis not present

## 2017-10-12 DIAGNOSIS — A419 Sepsis, unspecified organism: Secondary | ICD-10-CM | POA: Diagnosis not present

## 2017-10-12 DIAGNOSIS — R1311 Dysphagia, oral phase: Secondary | ICD-10-CM | POA: Diagnosis not present

## 2017-10-12 DIAGNOSIS — B964 Proteus (mirabilis) (morganii) as the cause of diseases classified elsewhere: Secondary | ICD-10-CM | POA: Diagnosis not present

## 2017-10-12 DIAGNOSIS — R7881 Bacteremia: Secondary | ICD-10-CM | POA: Diagnosis not present

## 2017-10-12 DIAGNOSIS — S14122D Central cord syndrome at C2 level of cervical spinal cord, subsequent encounter: Secondary | ICD-10-CM | POA: Diagnosis not present

## 2017-10-12 DIAGNOSIS — N390 Urinary tract infection, site not specified: Secondary | ICD-10-CM | POA: Diagnosis not present

## 2017-10-12 DIAGNOSIS — K5900 Constipation, unspecified: Secondary | ICD-10-CM | POA: Diagnosis not present

## 2017-10-12 DIAGNOSIS — J188 Other pneumonia, unspecified organism: Secondary | ICD-10-CM | POA: Diagnosis not present

## 2017-10-13 DIAGNOSIS — M6281 Muscle weakness (generalized): Secondary | ICD-10-CM | POA: Diagnosis not present

## 2017-10-13 DIAGNOSIS — F201 Disorganized schizophrenia: Secondary | ICD-10-CM | POA: Diagnosis not present

## 2017-10-13 DIAGNOSIS — G8250 Quadriplegia, unspecified: Secondary | ICD-10-CM | POA: Diagnosis not present

## 2017-10-13 DIAGNOSIS — F919 Conduct disorder, unspecified: Secondary | ICD-10-CM | POA: Diagnosis not present

## 2017-10-13 DIAGNOSIS — R1311 Dysphagia, oral phase: Secondary | ICD-10-CM | POA: Diagnosis not present

## 2017-10-13 DIAGNOSIS — F330 Major depressive disorder, recurrent, mild: Secondary | ICD-10-CM | POA: Diagnosis not present

## 2017-10-13 DIAGNOSIS — R41841 Cognitive communication deficit: Secondary | ICD-10-CM | POA: Diagnosis not present

## 2017-10-13 DIAGNOSIS — F413 Other mixed anxiety disorders: Secondary | ICD-10-CM | POA: Diagnosis not present

## 2017-10-13 DIAGNOSIS — J188 Other pneumonia, unspecified organism: Secondary | ICD-10-CM | POA: Diagnosis not present

## 2017-10-13 DIAGNOSIS — S14122D Central cord syndrome at C2 level of cervical spinal cord, subsequent encounter: Secondary | ICD-10-CM | POA: Diagnosis not present

## 2017-10-14 DIAGNOSIS — R41841 Cognitive communication deficit: Secondary | ICD-10-CM | POA: Diagnosis not present

## 2017-10-14 DIAGNOSIS — M6249 Contracture of muscle, multiple sites: Secondary | ICD-10-CM | POA: Diagnosis not present

## 2017-10-14 DIAGNOSIS — M6281 Muscle weakness (generalized): Secondary | ICD-10-CM | POA: Diagnosis not present

## 2017-10-14 DIAGNOSIS — K5900 Constipation, unspecified: Secondary | ICD-10-CM | POA: Diagnosis not present

## 2017-10-14 DIAGNOSIS — J188 Other pneumonia, unspecified organism: Secondary | ICD-10-CM | POA: Diagnosis not present

## 2017-10-14 DIAGNOSIS — S14122D Central cord syndrome at C2 level of cervical spinal cord, subsequent encounter: Secondary | ICD-10-CM | POA: Diagnosis not present

## 2017-10-14 DIAGNOSIS — G894 Chronic pain syndrome: Secondary | ICD-10-CM | POA: Diagnosis not present

## 2017-10-14 DIAGNOSIS — G8250 Quadriplegia, unspecified: Secondary | ICD-10-CM | POA: Diagnosis not present

## 2017-10-14 DIAGNOSIS — R1311 Dysphagia, oral phase: Secondary | ICD-10-CM | POA: Diagnosis not present

## 2017-10-15 DIAGNOSIS — S14122D Central cord syndrome at C2 level of cervical spinal cord, subsequent encounter: Secondary | ICD-10-CM | POA: Diagnosis not present

## 2017-10-15 DIAGNOSIS — M6281 Muscle weakness (generalized): Secondary | ICD-10-CM | POA: Diagnosis not present

## 2017-10-15 DIAGNOSIS — R41841 Cognitive communication deficit: Secondary | ICD-10-CM | POA: Diagnosis not present

## 2017-10-15 DIAGNOSIS — R1311 Dysphagia, oral phase: Secondary | ICD-10-CM | POA: Diagnosis not present

## 2017-10-15 DIAGNOSIS — J188 Other pneumonia, unspecified organism: Secondary | ICD-10-CM | POA: Diagnosis not present

## 2017-10-15 DIAGNOSIS — G8250 Quadriplegia, unspecified: Secondary | ICD-10-CM | POA: Diagnosis not present

## 2017-10-17 DIAGNOSIS — R1311 Dysphagia, oral phase: Secondary | ICD-10-CM | POA: Diagnosis not present

## 2017-10-17 DIAGNOSIS — R41841 Cognitive communication deficit: Secondary | ICD-10-CM | POA: Diagnosis not present

## 2017-10-17 DIAGNOSIS — M6281 Muscle weakness (generalized): Secondary | ICD-10-CM | POA: Diagnosis not present

## 2017-10-17 DIAGNOSIS — J188 Other pneumonia, unspecified organism: Secondary | ICD-10-CM | POA: Diagnosis not present

## 2017-10-17 DIAGNOSIS — S14122D Central cord syndrome at C2 level of cervical spinal cord, subsequent encounter: Secondary | ICD-10-CM | POA: Diagnosis not present

## 2017-10-17 DIAGNOSIS — D649 Anemia, unspecified: Secondary | ICD-10-CM | POA: Diagnosis not present

## 2017-10-17 DIAGNOSIS — I10 Essential (primary) hypertension: Secondary | ICD-10-CM | POA: Diagnosis not present

## 2017-10-17 DIAGNOSIS — G8250 Quadriplegia, unspecified: Secondary | ICD-10-CM | POA: Diagnosis not present

## 2017-10-18 DIAGNOSIS — G8250 Quadriplegia, unspecified: Secondary | ICD-10-CM | POA: Diagnosis not present

## 2017-10-18 DIAGNOSIS — G894 Chronic pain syndrome: Secondary | ICD-10-CM | POA: Diagnosis not present

## 2017-10-18 DIAGNOSIS — R41841 Cognitive communication deficit: Secondary | ICD-10-CM | POA: Diagnosis not present

## 2017-10-18 DIAGNOSIS — M6281 Muscle weakness (generalized): Secondary | ICD-10-CM | POA: Diagnosis not present

## 2017-10-18 DIAGNOSIS — J188 Other pneumonia, unspecified organism: Secondary | ICD-10-CM | POA: Diagnosis not present

## 2017-10-18 DIAGNOSIS — K5900 Constipation, unspecified: Secondary | ICD-10-CM | POA: Diagnosis not present

## 2017-10-18 DIAGNOSIS — R1311 Dysphagia, oral phase: Secondary | ICD-10-CM | POA: Diagnosis not present

## 2017-10-18 DIAGNOSIS — S14122D Central cord syndrome at C2 level of cervical spinal cord, subsequent encounter: Secondary | ICD-10-CM | POA: Diagnosis not present

## 2017-10-18 DIAGNOSIS — M6249 Contracture of muscle, multiple sites: Secondary | ICD-10-CM | POA: Diagnosis not present

## 2017-10-19 ENCOUNTER — Inpatient Hospital Stay: Payer: Medicare Other | Admitting: Physical Medicine & Rehabilitation

## 2017-10-19 DIAGNOSIS — J188 Other pneumonia, unspecified organism: Secondary | ICD-10-CM | POA: Diagnosis not present

## 2017-10-19 DIAGNOSIS — R7881 Bacteremia: Secondary | ICD-10-CM | POA: Diagnosis not present

## 2017-10-19 DIAGNOSIS — R1311 Dysphagia, oral phase: Secondary | ICD-10-CM | POA: Diagnosis not present

## 2017-10-19 DIAGNOSIS — A419 Sepsis, unspecified organism: Secondary | ICD-10-CM | POA: Diagnosis not present

## 2017-10-19 DIAGNOSIS — G8250 Quadriplegia, unspecified: Secondary | ICD-10-CM | POA: Diagnosis not present

## 2017-10-19 DIAGNOSIS — N390 Urinary tract infection, site not specified: Secondary | ICD-10-CM | POA: Diagnosis not present

## 2017-10-19 DIAGNOSIS — R41841 Cognitive communication deficit: Secondary | ICD-10-CM | POA: Diagnosis not present

## 2017-10-19 DIAGNOSIS — S14122D Central cord syndrome at C2 level of cervical spinal cord, subsequent encounter: Secondary | ICD-10-CM | POA: Diagnosis not present

## 2017-10-19 DIAGNOSIS — M6281 Muscle weakness (generalized): Secondary | ICD-10-CM | POA: Diagnosis not present

## 2017-10-19 DIAGNOSIS — D509 Iron deficiency anemia, unspecified: Secondary | ICD-10-CM | POA: Diagnosis not present

## 2017-10-20 DIAGNOSIS — S14122D Central cord syndrome at C2 level of cervical spinal cord, subsequent encounter: Secondary | ICD-10-CM | POA: Diagnosis not present

## 2017-10-20 DIAGNOSIS — F413 Other mixed anxiety disorders: Secondary | ICD-10-CM | POA: Diagnosis not present

## 2017-10-20 DIAGNOSIS — F201 Disorganized schizophrenia: Secondary | ICD-10-CM | POA: Diagnosis not present

## 2017-10-20 DIAGNOSIS — R1311 Dysphagia, oral phase: Secondary | ICD-10-CM | POA: Diagnosis not present

## 2017-10-20 DIAGNOSIS — J188 Other pneumonia, unspecified organism: Secondary | ICD-10-CM | POA: Diagnosis not present

## 2017-10-20 DIAGNOSIS — R41841 Cognitive communication deficit: Secondary | ICD-10-CM | POA: Diagnosis not present

## 2017-10-20 DIAGNOSIS — F333 Major depressive disorder, recurrent, severe with psychotic symptoms: Secondary | ICD-10-CM | POA: Diagnosis not present

## 2017-10-20 DIAGNOSIS — M6281 Muscle weakness (generalized): Secondary | ICD-10-CM | POA: Diagnosis not present

## 2017-10-20 DIAGNOSIS — G8250 Quadriplegia, unspecified: Secondary | ICD-10-CM | POA: Diagnosis not present

## 2017-10-20 DIAGNOSIS — F919 Conduct disorder, unspecified: Secondary | ICD-10-CM | POA: Diagnosis not present

## 2017-10-21 DIAGNOSIS — R41841 Cognitive communication deficit: Secondary | ICD-10-CM | POA: Diagnosis not present

## 2017-10-21 DIAGNOSIS — M6281 Muscle weakness (generalized): Secondary | ICD-10-CM | POA: Diagnosis not present

## 2017-10-21 DIAGNOSIS — R1312 Dysphagia, oropharyngeal phase: Secondary | ICD-10-CM | POA: Diagnosis not present

## 2017-10-21 DIAGNOSIS — J188 Other pneumonia, unspecified organism: Secondary | ICD-10-CM | POA: Diagnosis not present

## 2017-10-21 DIAGNOSIS — S14122D Central cord syndrome at C2 level of cervical spinal cord, subsequent encounter: Secondary | ICD-10-CM | POA: Diagnosis not present

## 2017-10-21 DIAGNOSIS — R1311 Dysphagia, oral phase: Secondary | ICD-10-CM | POA: Diagnosis not present

## 2017-10-21 DIAGNOSIS — G8250 Quadriplegia, unspecified: Secondary | ICD-10-CM | POA: Diagnosis not present

## 2017-10-22 DIAGNOSIS — M6281 Muscle weakness (generalized): Secondary | ICD-10-CM | POA: Diagnosis not present

## 2017-10-22 DIAGNOSIS — G8250 Quadriplegia, unspecified: Secondary | ICD-10-CM | POA: Diagnosis not present

## 2017-10-22 DIAGNOSIS — R1311 Dysphagia, oral phase: Secondary | ICD-10-CM | POA: Diagnosis not present

## 2017-10-22 DIAGNOSIS — J188 Other pneumonia, unspecified organism: Secondary | ICD-10-CM | POA: Diagnosis not present

## 2017-10-22 DIAGNOSIS — S14122D Central cord syndrome at C2 level of cervical spinal cord, subsequent encounter: Secondary | ICD-10-CM | POA: Diagnosis not present

## 2017-10-22 DIAGNOSIS — R41841 Cognitive communication deficit: Secondary | ICD-10-CM | POA: Diagnosis not present

## 2017-10-24 DIAGNOSIS — F333 Major depressive disorder, recurrent, severe with psychotic symptoms: Secondary | ICD-10-CM | POA: Diagnosis not present

## 2017-10-24 DIAGNOSIS — R41841 Cognitive communication deficit: Secondary | ICD-10-CM | POA: Diagnosis not present

## 2017-10-24 DIAGNOSIS — M6281 Muscle weakness (generalized): Secondary | ICD-10-CM | POA: Diagnosis not present

## 2017-10-24 DIAGNOSIS — G8250 Quadriplegia, unspecified: Secondary | ICD-10-CM | POA: Diagnosis not present

## 2017-10-24 DIAGNOSIS — F413 Other mixed anxiety disorders: Secondary | ICD-10-CM | POA: Diagnosis not present

## 2017-10-24 DIAGNOSIS — S14122D Central cord syndrome at C2 level of cervical spinal cord, subsequent encounter: Secondary | ICD-10-CM | POA: Diagnosis not present

## 2017-10-24 DIAGNOSIS — J188 Other pneumonia, unspecified organism: Secondary | ICD-10-CM | POA: Diagnosis not present

## 2017-10-24 DIAGNOSIS — F201 Disorganized schizophrenia: Secondary | ICD-10-CM | POA: Diagnosis not present

## 2017-10-24 DIAGNOSIS — R1311 Dysphagia, oral phase: Secondary | ICD-10-CM | POA: Diagnosis not present

## 2017-10-24 DIAGNOSIS — F919 Conduct disorder, unspecified: Secondary | ICD-10-CM | POA: Diagnosis not present

## 2017-10-25 DIAGNOSIS — S14122D Central cord syndrome at C2 level of cervical spinal cord, subsequent encounter: Secondary | ICD-10-CM | POA: Diagnosis not present

## 2017-10-25 DIAGNOSIS — R1311 Dysphagia, oral phase: Secondary | ICD-10-CM | POA: Diagnosis not present

## 2017-10-25 DIAGNOSIS — M6281 Muscle weakness (generalized): Secondary | ICD-10-CM | POA: Diagnosis not present

## 2017-10-25 DIAGNOSIS — R319 Hematuria, unspecified: Secondary | ICD-10-CM | POA: Diagnosis not present

## 2017-10-25 DIAGNOSIS — J188 Other pneumonia, unspecified organism: Secondary | ICD-10-CM | POA: Diagnosis not present

## 2017-10-25 DIAGNOSIS — N390 Urinary tract infection, site not specified: Secondary | ICD-10-CM | POA: Diagnosis not present

## 2017-10-25 DIAGNOSIS — G8250 Quadriplegia, unspecified: Secondary | ICD-10-CM | POA: Diagnosis not present

## 2017-10-25 DIAGNOSIS — R41841 Cognitive communication deficit: Secondary | ICD-10-CM | POA: Diagnosis not present

## 2017-10-26 DIAGNOSIS — J188 Other pneumonia, unspecified organism: Secondary | ICD-10-CM | POA: Diagnosis not present

## 2017-10-26 DIAGNOSIS — M6281 Muscle weakness (generalized): Secondary | ICD-10-CM | POA: Diagnosis not present

## 2017-10-26 DIAGNOSIS — G8250 Quadriplegia, unspecified: Secondary | ICD-10-CM | POA: Diagnosis not present

## 2017-10-26 DIAGNOSIS — R1311 Dysphagia, oral phase: Secondary | ICD-10-CM | POA: Diagnosis not present

## 2017-10-26 DIAGNOSIS — S14122D Central cord syndrome at C2 level of cervical spinal cord, subsequent encounter: Secondary | ICD-10-CM | POA: Diagnosis not present

## 2017-10-26 DIAGNOSIS — R41841 Cognitive communication deficit: Secondary | ICD-10-CM | POA: Diagnosis not present

## 2017-10-27 DIAGNOSIS — G8250 Quadriplegia, unspecified: Secondary | ICD-10-CM | POA: Diagnosis not present

## 2017-10-27 DIAGNOSIS — N390 Urinary tract infection, site not specified: Secondary | ICD-10-CM | POA: Diagnosis not present

## 2017-10-27 DIAGNOSIS — S14122D Central cord syndrome at C2 level of cervical spinal cord, subsequent encounter: Secondary | ICD-10-CM | POA: Diagnosis not present

## 2017-10-27 DIAGNOSIS — M6281 Muscle weakness (generalized): Secondary | ICD-10-CM | POA: Diagnosis not present

## 2017-10-27 DIAGNOSIS — J188 Other pneumonia, unspecified organism: Secondary | ICD-10-CM | POA: Diagnosis not present

## 2017-10-27 DIAGNOSIS — R509 Fever, unspecified: Secondary | ICD-10-CM | POA: Diagnosis not present

## 2017-10-27 DIAGNOSIS — R1311 Dysphagia, oral phase: Secondary | ICD-10-CM | POA: Diagnosis not present

## 2017-10-27 DIAGNOSIS — R41841 Cognitive communication deficit: Secondary | ICD-10-CM | POA: Diagnosis not present

## 2017-10-28 DIAGNOSIS — J188 Other pneumonia, unspecified organism: Secondary | ICD-10-CM | POA: Diagnosis not present

## 2017-10-28 DIAGNOSIS — S14122D Central cord syndrome at C2 level of cervical spinal cord, subsequent encounter: Secondary | ICD-10-CM | POA: Diagnosis not present

## 2017-10-28 DIAGNOSIS — G8250 Quadriplegia, unspecified: Secondary | ICD-10-CM | POA: Diagnosis not present

## 2017-10-28 DIAGNOSIS — R1311 Dysphagia, oral phase: Secondary | ICD-10-CM | POA: Diagnosis not present

## 2017-10-28 DIAGNOSIS — R41841 Cognitive communication deficit: Secondary | ICD-10-CM | POA: Diagnosis not present

## 2017-10-28 DIAGNOSIS — M6281 Muscle weakness (generalized): Secondary | ICD-10-CM | POA: Diagnosis not present

## 2017-10-31 DIAGNOSIS — S14122D Central cord syndrome at C2 level of cervical spinal cord, subsequent encounter: Secondary | ICD-10-CM | POA: Diagnosis not present

## 2017-10-31 DIAGNOSIS — J188 Other pneumonia, unspecified organism: Secondary | ICD-10-CM | POA: Diagnosis not present

## 2017-10-31 DIAGNOSIS — M6281 Muscle weakness (generalized): Secondary | ICD-10-CM | POA: Diagnosis not present

## 2017-10-31 DIAGNOSIS — R41841 Cognitive communication deficit: Secondary | ICD-10-CM | POA: Diagnosis not present

## 2017-10-31 DIAGNOSIS — R1311 Dysphagia, oral phase: Secondary | ICD-10-CM | POA: Diagnosis not present

## 2017-10-31 DIAGNOSIS — G8250 Quadriplegia, unspecified: Secondary | ICD-10-CM | POA: Diagnosis not present

## 2017-11-01 DIAGNOSIS — J188 Other pneumonia, unspecified organism: Secondary | ICD-10-CM | POA: Diagnosis not present

## 2017-11-01 DIAGNOSIS — Z79899 Other long term (current) drug therapy: Secondary | ICD-10-CM | POA: Diagnosis not present

## 2017-11-01 DIAGNOSIS — R509 Fever, unspecified: Secondary | ICD-10-CM | POA: Diagnosis not present

## 2017-11-01 DIAGNOSIS — R319 Hematuria, unspecified: Secondary | ICD-10-CM | POA: Diagnosis not present

## 2017-11-01 DIAGNOSIS — N390 Urinary tract infection, site not specified: Secondary | ICD-10-CM | POA: Diagnosis not present

## 2017-11-01 DIAGNOSIS — S14122D Central cord syndrome at C2 level of cervical spinal cord, subsequent encounter: Secondary | ICD-10-CM | POA: Diagnosis not present

## 2017-11-01 DIAGNOSIS — M6281 Muscle weakness (generalized): Secondary | ICD-10-CM | POA: Diagnosis not present

## 2017-11-01 DIAGNOSIS — R41841 Cognitive communication deficit: Secondary | ICD-10-CM | POA: Diagnosis not present

## 2017-11-01 DIAGNOSIS — R1311 Dysphagia, oral phase: Secondary | ICD-10-CM | POA: Diagnosis not present

## 2017-11-01 DIAGNOSIS — G8250 Quadriplegia, unspecified: Secondary | ICD-10-CM | POA: Diagnosis not present

## 2017-11-02 DIAGNOSIS — R41841 Cognitive communication deficit: Secondary | ICD-10-CM | POA: Diagnosis not present

## 2017-11-02 DIAGNOSIS — J188 Other pneumonia, unspecified organism: Secondary | ICD-10-CM | POA: Diagnosis not present

## 2017-11-02 DIAGNOSIS — S14122D Central cord syndrome at C2 level of cervical spinal cord, subsequent encounter: Secondary | ICD-10-CM | POA: Diagnosis not present

## 2017-11-02 DIAGNOSIS — G8250 Quadriplegia, unspecified: Secondary | ICD-10-CM | POA: Diagnosis not present

## 2017-11-02 DIAGNOSIS — R1311 Dysphagia, oral phase: Secondary | ICD-10-CM | POA: Diagnosis not present

## 2017-11-02 DIAGNOSIS — M6281 Muscle weakness (generalized): Secondary | ICD-10-CM | POA: Diagnosis not present

## 2017-11-03 DIAGNOSIS — M6281 Muscle weakness (generalized): Secondary | ICD-10-CM | POA: Diagnosis not present

## 2017-11-03 DIAGNOSIS — G8250 Quadriplegia, unspecified: Secondary | ICD-10-CM | POA: Diagnosis not present

## 2017-11-03 DIAGNOSIS — R41841 Cognitive communication deficit: Secondary | ICD-10-CM | POA: Diagnosis not present

## 2017-11-03 DIAGNOSIS — S14122D Central cord syndrome at C2 level of cervical spinal cord, subsequent encounter: Secondary | ICD-10-CM | POA: Diagnosis not present

## 2017-11-03 DIAGNOSIS — R1311 Dysphagia, oral phase: Secondary | ICD-10-CM | POA: Diagnosis not present

## 2017-11-03 DIAGNOSIS — J188 Other pneumonia, unspecified organism: Secondary | ICD-10-CM | POA: Diagnosis not present

## 2017-11-04 DIAGNOSIS — J188 Other pneumonia, unspecified organism: Secondary | ICD-10-CM | POA: Diagnosis not present

## 2017-11-04 DIAGNOSIS — S14122D Central cord syndrome at C2 level of cervical spinal cord, subsequent encounter: Secondary | ICD-10-CM | POA: Diagnosis not present

## 2017-11-04 DIAGNOSIS — G8250 Quadriplegia, unspecified: Secondary | ICD-10-CM | POA: Diagnosis not present

## 2017-11-04 DIAGNOSIS — R41841 Cognitive communication deficit: Secondary | ICD-10-CM | POA: Diagnosis not present

## 2017-11-04 DIAGNOSIS — M6281 Muscle weakness (generalized): Secondary | ICD-10-CM | POA: Diagnosis not present

## 2017-11-04 DIAGNOSIS — R1311 Dysphagia, oral phase: Secondary | ICD-10-CM | POA: Diagnosis not present

## 2017-11-05 DIAGNOSIS — J188 Other pneumonia, unspecified organism: Secondary | ICD-10-CM | POA: Diagnosis not present

## 2017-11-05 DIAGNOSIS — G8250 Quadriplegia, unspecified: Secondary | ICD-10-CM | POA: Diagnosis not present

## 2017-11-05 DIAGNOSIS — S14122D Central cord syndrome at C2 level of cervical spinal cord, subsequent encounter: Secondary | ICD-10-CM | POA: Diagnosis not present

## 2017-11-05 DIAGNOSIS — M6281 Muscle weakness (generalized): Secondary | ICD-10-CM | POA: Diagnosis not present

## 2017-11-05 DIAGNOSIS — R1311 Dysphagia, oral phase: Secondary | ICD-10-CM | POA: Diagnosis not present

## 2017-11-05 DIAGNOSIS — R41841 Cognitive communication deficit: Secondary | ICD-10-CM | POA: Diagnosis not present

## 2017-11-07 DIAGNOSIS — Z960 Presence of urogenital implants: Secondary | ICD-10-CM | POA: Diagnosis not present

## 2017-11-07 DIAGNOSIS — B964 Proteus (mirabilis) (morganii) as the cause of diseases classified elsewhere: Secondary | ICD-10-CM | POA: Diagnosis not present

## 2017-11-07 DIAGNOSIS — R41841 Cognitive communication deficit: Secondary | ICD-10-CM | POA: Diagnosis not present

## 2017-11-07 DIAGNOSIS — R1311 Dysphagia, oral phase: Secondary | ICD-10-CM | POA: Diagnosis not present

## 2017-11-07 DIAGNOSIS — N319 Neuromuscular dysfunction of bladder, unspecified: Secondary | ICD-10-CM | POA: Diagnosis not present

## 2017-11-07 DIAGNOSIS — M6281 Muscle weakness (generalized): Secondary | ICD-10-CM | POA: Diagnosis not present

## 2017-11-07 DIAGNOSIS — N390 Urinary tract infection, site not specified: Secondary | ICD-10-CM | POA: Diagnosis not present

## 2017-11-07 DIAGNOSIS — J188 Other pneumonia, unspecified organism: Secondary | ICD-10-CM | POA: Diagnosis not present

## 2017-11-07 DIAGNOSIS — S14122D Central cord syndrome at C2 level of cervical spinal cord, subsequent encounter: Secondary | ICD-10-CM | POA: Diagnosis not present

## 2017-11-07 DIAGNOSIS — G8250 Quadriplegia, unspecified: Secondary | ICD-10-CM | POA: Diagnosis not present

## 2017-11-08 DIAGNOSIS — J188 Other pneumonia, unspecified organism: Secondary | ICD-10-CM | POA: Diagnosis not present

## 2017-11-08 DIAGNOSIS — M6281 Muscle weakness (generalized): Secondary | ICD-10-CM | POA: Diagnosis not present

## 2017-11-08 DIAGNOSIS — B964 Proteus (mirabilis) (morganii) as the cause of diseases classified elsewhere: Secondary | ICD-10-CM | POA: Diagnosis not present

## 2017-11-08 DIAGNOSIS — R41841 Cognitive communication deficit: Secondary | ICD-10-CM | POA: Diagnosis not present

## 2017-11-08 DIAGNOSIS — S14122D Central cord syndrome at C2 level of cervical spinal cord, subsequent encounter: Secondary | ICD-10-CM | POA: Diagnosis not present

## 2017-11-08 DIAGNOSIS — R1311 Dysphagia, oral phase: Secondary | ICD-10-CM | POA: Diagnosis not present

## 2017-11-08 DIAGNOSIS — Z8701 Personal history of pneumonia (recurrent): Secondary | ICD-10-CM | POA: Diagnosis not present

## 2017-11-08 DIAGNOSIS — N390 Urinary tract infection, site not specified: Secondary | ICD-10-CM | POA: Diagnosis not present

## 2017-11-08 DIAGNOSIS — G8250 Quadriplegia, unspecified: Secondary | ICD-10-CM | POA: Diagnosis not present

## 2017-11-09 DIAGNOSIS — R41841 Cognitive communication deficit: Secondary | ICD-10-CM | POA: Diagnosis not present

## 2017-11-09 DIAGNOSIS — R1311 Dysphagia, oral phase: Secondary | ICD-10-CM | POA: Diagnosis not present

## 2017-11-09 DIAGNOSIS — G8250 Quadriplegia, unspecified: Secondary | ICD-10-CM | POA: Diagnosis not present

## 2017-11-09 DIAGNOSIS — J188 Other pneumonia, unspecified organism: Secondary | ICD-10-CM | POA: Diagnosis not present

## 2017-11-09 DIAGNOSIS — M6281 Muscle weakness (generalized): Secondary | ICD-10-CM | POA: Diagnosis not present

## 2017-11-09 DIAGNOSIS — S14122D Central cord syndrome at C2 level of cervical spinal cord, subsequent encounter: Secondary | ICD-10-CM | POA: Diagnosis not present

## 2017-11-10 ENCOUNTER — Emergency Department (HOSPITAL_COMMUNITY): Payer: Medicare Other

## 2017-11-10 ENCOUNTER — Other Ambulatory Visit: Payer: Self-pay

## 2017-11-10 ENCOUNTER — Emergency Department (HOSPITAL_COMMUNITY)
Admission: EM | Admit: 2017-11-10 | Discharge: 2017-11-10 | Disposition: A | Discharge status: Home / Self Care | Payer: Medicare Other | Attending: Emergency Medicine | Admitting: Emergency Medicine

## 2017-11-10 DIAGNOSIS — R41841 Cognitive communication deficit: Secondary | ICD-10-CM | POA: Diagnosis not present

## 2017-11-10 DIAGNOSIS — J188 Other pneumonia, unspecified organism: Secondary | ICD-10-CM | POA: Diagnosis not present

## 2017-11-10 DIAGNOSIS — M7989 Other specified soft tissue disorders: Secondary | ICD-10-CM | POA: Diagnosis not present

## 2017-11-10 DIAGNOSIS — R339 Retention of urine, unspecified: Secondary | ICD-10-CM | POA: Diagnosis not present

## 2017-11-10 DIAGNOSIS — R279 Unspecified lack of coordination: Secondary | ICD-10-CM | POA: Diagnosis not present

## 2017-11-10 DIAGNOSIS — Z79899 Other long term (current) drug therapy: Secondary | ICD-10-CM | POA: Diagnosis not present

## 2017-11-10 DIAGNOSIS — Z7401 Bed confinement status: Secondary | ICD-10-CM | POA: Diagnosis not present

## 2017-11-10 DIAGNOSIS — I10 Essential (primary) hypertension: Secondary | ICD-10-CM | POA: Diagnosis not present

## 2017-11-10 DIAGNOSIS — R319 Hematuria, unspecified: Secondary | ICD-10-CM | POA: Diagnosis not present

## 2017-11-10 DIAGNOSIS — G8250 Quadriplegia, unspecified: Secondary | ICD-10-CM | POA: Diagnosis not present

## 2017-11-10 DIAGNOSIS — Z7982 Long term (current) use of aspirin: Secondary | ICD-10-CM | POA: Diagnosis not present

## 2017-11-10 DIAGNOSIS — S14122D Central cord syndrome at C2 level of cervical spinal cord, subsequent encounter: Secondary | ICD-10-CM | POA: Diagnosis not present

## 2017-11-10 DIAGNOSIS — M6281 Muscle weakness (generalized): Secondary | ICD-10-CM | POA: Diagnosis not present

## 2017-11-10 DIAGNOSIS — D649 Anemia, unspecified: Secondary | ICD-10-CM | POA: Diagnosis not present

## 2017-11-10 DIAGNOSIS — M79672 Pain in left foot: Secondary | ICD-10-CM | POA: Insufficient documentation

## 2017-11-10 DIAGNOSIS — R1311 Dysphagia, oral phase: Secondary | ICD-10-CM | POA: Diagnosis not present

## 2017-11-10 LAB — URINALYSIS, ROUTINE W REFLEX MICROSCOPIC
Bilirubin Urine: NEGATIVE
GLUCOSE, UA: NEGATIVE mg/dL
Ketones, ur: NEGATIVE mg/dL
Nitrite: NEGATIVE
PH: 6 (ref 5.0–8.0)
Protein, ur: NEGATIVE mg/dL
SPECIFIC GRAVITY, URINE: 1.009 (ref 1.005–1.030)
SQUAMOUS EPITHELIAL / LPF: NONE SEEN

## 2017-11-10 MED ORDER — CEPHALEXIN 500 MG PO CAPS
500.0000 mg | ORAL_CAPSULE | Freq: Once | ORAL | Status: AC
  Administered 2017-11-10: 500 mg via ORAL
  Filled 2017-11-10: qty 1

## 2017-11-10 MED ORDER — CEPHALEXIN 500 MG PO CAPS: 500.0000 mg | ORAL_CAPSULE | Freq: Three times a day (TID) | ORAL | 0 refills | Status: DC

## 2017-11-10 NOTE — ED Notes (Addendum)
Pt carried out by [facility] on stretcher. Pt verbalized understanding of discharge instructions. Pt is unable to sign signature pad.

## 2017-11-10 NOTE — Discharge Instructions (Signed)
Do not remove Foley until 30-day time.  Unless otherwise instructed by primary care physician or urologist.  Keflex prescription times 7 days for possible UTI.  You will be contacted if culture shows sensitivity that requires change of medication.  To staff at facility: Please place bilateral heel pads to patient.

## 2017-11-10 NOTE — ED Provider Notes (Signed)
[facility] EMERGENCY DEPARTMENT Provider Note   CSN: [id number] Arrival date & time: 11/10/17  1941     History   Chief Complaint Chief Complaint  Patient presents with  . Hematuria    HPI Dean Oliver is a 72 y.o. male.  Chief complaint is unable to Place Foley, penile bleeding.  HPI this is a 72 year old male with a history of quadriplegia and neurogenic bowel and bladder.  Recent UTI.  Most recent discharge instructions state indwelling Foley.  Apparently nursing home is been doing straight caths frequently.  Unable to pass Foley denies any bleeding from penile meatus noted and he was transferred here.  His caregiver arrives later with multiple other complaints. Particularly right foot pain.   Past Medical History:  Diagnosis Date  . Cataracts, bilateral   . HTN (hypertension)   . Quadriplegia ([place])   . Schizophrenia [facility])     Patient Active Problem List   Diagnosis Date Noted  . Pressure injury of skin 10/06/2017  . Fever in adult   . Gross hematuria   . Urinary tract infection with hematuria   . Quadriplegia ([place]) 10/05/2017  . Sepsis secondary to UTI ([place]) 10/05/2017  . AKI (acute kidney injury) ([place]) 10/05/2017  . HCAP (healthcare-associated pneumonia) 10/05/2017  . Neurogenic bladder 08/18/2017  . Neurogenic bowel 08/18/2017  . Bacterial UTI 08/18/2017  . Central cord syndrome ([place]) 08/17/2017  . Dysphagia   . Fall   . Hypotension   . Sinus bradycardia   . HNP (herniated nucleus pulposus) with myelopathy, cervical   . Benign essential HTN   . Hyponatremia   . Acute blood loss anemia   . Symptomatic bradycardia 08/12/2017  . Schizophrenia [facility])     Past Surgical History:  Procedure Laterality Date  . None         Home Medications    Prior to Admission medications   Medication Sig Start Date End Date Taking? Authorizing Provider  acetaminophen (TYLENOL) 325 MG tablet Take 2 tablets (650 mg total) by mouth every 6 (six) hours as needed for  mild pain, moderate pain or fever. 10/10/17   Johnson, Clanford L, MD  ALPRAZolam (XANAX) 0.25 MG tablet Take 1 tablet (0.25 mg total) by mouth 2 (two) times daily as needed for anxiety. 10/10/17   Johnson, Clanford L, MD  aspirin EC 81 MG EC tablet Take 1 tablet (81 mg total) by mouth daily. 08/18/17   Allie Bossier, MD  Baclofen 5 MG TABS Take 5 mg by mouth 2 (two) times daily. 09/07/17   Angiulli, Lavon Paganini, PA-C  cephALEXin (KEFLEX) 500 MG capsule Take 1 capsule (500 mg total) by mouth 3 (three) times daily. 11/10/17   Tanna Furry, MD  clotrimazole (LOTRIMIN) 1 % cream Apply 1 application topically 2 (two) times daily. 14  Day course to left cheek for ring worm starting on 09/28/2017    [provider]  ferrous sulfate 325 (65 FE) MG tablet Take 325 mg by mouth daily with breakfast.    [provider]  HYDROcodone-acetaminophen (NORCO/VICODIN) 5-325 MG tablet Take 1 tablet by mouth every 6 (six) hours as needed for moderate pain. 10/10/17   Johnson, Clanford L, MD  Multiple Vitamins-Minerals (CERTAVITE SENIOR/ANTIOXIDANT) TABS Take 1 tablet by mouth daily.    [provider]  risperiDONE (RISPERDAL) 0.5 MG tablet Take 1 tablet (0.5 mg total) by mouth 2 (two) times daily. 09/07/17   Angiulli, Lavon Paganini, PA-C  senna-docusate (SENOKOT-S) 8.6-50 MG tablet Take 2  tablets by mouth 2 (two) times daily. 10/10/17   Johnson, Clanford L, MD  sodium phosphate (FLEET) 7-19 GM/118ML ENEM Place 133 mLs (1 enema total) rectally daily as needed for severe constipation. 09/07/17   Angiulli, Lavon Paganini, PA-C  tiZANidine (ZANAFLEX) 4 MG tablet Take 1 tablet (4 mg total) by mouth every 6 (six) hours as needed for muscle spasms. 09/07/17   Angiulli, Lavon Paganini, PA-C  vitamin C (ASCORBIC ACID) 500 MG tablet Take 500 mg by mouth daily.    [provider]    Family History Family History  Family history unknown: Yes    Social History Social History   Tobacco Use  . Smoking status: Never  Smoker  . Smokeless tobacco: Never Used  Substance Use Topics  . Alcohol use: No    Frequency: Never  . Drug use: No     Allergies   Patient has no known allergies.   Review of Systems Review of Systems  Constitutional: Negative for appetite change, chills, diaphoresis, fatigue and fever.  HENT: Negative for mouth sores, sore throat and trouble swallowing.   Eyes: Negative for visual disturbance.  Respiratory: Negative for cough, chest tightness, shortness of breath and wheezing.   Cardiovascular: Negative for chest pain.  Gastrointestinal: Negative for abdominal distention, abdominal pain, diarrhea, nausea and vomiting.  Endocrine: Negative for polydipsia, polyphagia and polyuria.  Genitourinary: Positive for hematuria. Negative for dysuria and frequency.  Musculoskeletal: Negative for gait problem.  Skin: Negative for color change, pallor and rash.  Neurological: Negative for dizziness, syncope, light-headedness and headaches.  Hematological: Does not bruise/bleed easily.  Psychiatric/Behavioral: Negative for behavioral problems and confusion.     Physical Exam Updated Vital Signs BP 120/68 (BP Location: Right Arm)   Pulse 76   Temp 99.1 F (37.3 C) (Oral)   Resp 18   Wt 62.1 kg (137 lb)   SpO2 96%   BMI 21.46 kg/m   Physical Exam  Constitutional: He appears well-developed and well-nourished. No distress.  HENT:  Head: Normocephalic.  Eyes: Conjunctivae are normal. Pupils are equal, round, and reactive to light. No scleral icterus.  Neck: Normal range of motion. Neck supple. No thyromegaly present.  Cardiovascular: Normal rate and regular rhythm. Exam reveals no gallop and no friction rub.  No murmur heard. Pulmonary/Chest: Effort normal and breath sounds normal. No respiratory distress. He has no wheezes. He has no rales.  Abdominal: Soft. Bowel sounds are normal. He exhibits no distension. There is no tenderness. There is no rebound.  Genitourinary:    Genitourinary Comments: Some blood at the meatus.  He has a strictured foreskin/phimosis.  Musculoskeletal: Normal range of motion.  Previous minimal soft tissue swelling.  No frank skin breakdown or erythema.  Neurological:  Quadraperesis.  Skin: Skin is warm and dry. No rash noted.  Psychiatric: He has a normal mood and affect. His behavior is normal.     ED Treatments / Results  Labs (all labs ordered are listed, but only abnormal results are displayed) Labs Reviewed  URINALYSIS, ROUTINE W REFLEX MICROSCOPIC - Abnormal; Notable for the following components:      Result Value   APPearance HAZY (*)    Hgb urine dipstick MODERATE (*)    Leukocytes, UA LARGE (*)    Bacteria, UA RARE (*)    All other components within normal limits  URINE CULTURE    EKG  EKG Interpretation None       Radiology Dg Foot Complete Left  Result Date: 11/10/2017 CLINICAL  DATA:  Chronic left foot pain.  Unsure of injury. EXAM: LEFT FOOT - COMPLETE 3+ VIEW COMPARISON:  None. FINDINGS: Limited by osteopenia that is prominent. There is soft tissue swelling about the foot and ankle that is nonspecific. No opaque foreign body or soft tissue gas. No definite acute fracture; calcific density between the bases of the first and second metatarsals is atherosclerotic. No dislocation. IMPRESSION: 1. Soft tissue swelling without gas or opaque foreign body. 2. No convincing fracture. Sensitivity of radiography is diminished by degree of osteopenia. Electronically Signed   By: Monte Fantasia M.D.   On: 11/10/2017 21:57    Procedures Procedures (including critical care time)  Medications Ordered in ED Medications  cephALEXin (KEFLEX) capsule 500 mg (not administered)     Initial Impression / Assessment and Plan / ED Course  I have reviewed the triage vital signs and the nursing notes.  Pertinent labs & imaging results that were available during my care of the patient were reviewed by me and considered in  my medical decision making (see chart for details).    X-ray negative.  He has good perfusion.  No erythema.  He is afebrile.  Will place heel pads.  Foley catheter placed by myself without difficulty.  Drains 1500 cc of urine.  Bloody with white blood cells.  Culture pending.   Discharge back to facility.  Follow-up culture as needed.  I have written to not straight cath to leave Foley in place for 30 days or as per instructed by primary care physician  Final Clinical Impressions(s) / ED Diagnoses   Final diagnoses:  Urinary retention  Left foot pain    ED Discharge Orders        Ordered    cephALEXin (KEFLEX) 500 MG capsule  3 times daily     11/10/17 2237       Tanna Furry, MD 11/10/17 2238

## 2017-11-10 NOTE — ED Triage Notes (Signed)
Pt brought by EMS from [place]. Facility states they attempted an in and out catheter and noticed there was blood when they removed the catheter. No bleeding noted at this time.

## 2017-11-11 DIAGNOSIS — M6281 Muscle weakness (generalized): Secondary | ICD-10-CM | POA: Diagnosis not present

## 2017-11-11 DIAGNOSIS — R41841 Cognitive communication deficit: Secondary | ICD-10-CM | POA: Diagnosis not present

## 2017-11-11 DIAGNOSIS — J188 Other pneumonia, unspecified organism: Secondary | ICD-10-CM | POA: Diagnosis not present

## 2017-11-11 DIAGNOSIS — G8250 Quadriplegia, unspecified: Secondary | ICD-10-CM | POA: Diagnosis not present

## 2017-11-11 DIAGNOSIS — S14122D Central cord syndrome at C2 level of cervical spinal cord, subsequent encounter: Secondary | ICD-10-CM | POA: Diagnosis not present

## 2017-11-11 DIAGNOSIS — R1311 Dysphagia, oral phase: Secondary | ICD-10-CM | POA: Diagnosis not present

## 2017-11-13 LAB — URINE CULTURE

## 2017-11-14 DIAGNOSIS — M542 Cervicalgia: Secondary | ICD-10-CM | POA: Diagnosis not present

## 2017-11-14 DIAGNOSIS — G8250 Quadriplegia, unspecified: Secondary | ICD-10-CM | POA: Diagnosis not present

## 2017-11-14 DIAGNOSIS — G8251 Quadriplegia, C1-C4 complete: Secondary | ICD-10-CM | POA: Diagnosis not present

## 2017-11-14 DIAGNOSIS — R1311 Dysphagia, oral phase: Secondary | ICD-10-CM | POA: Diagnosis not present

## 2017-11-14 DIAGNOSIS — M6281 Muscle weakness (generalized): Secondary | ICD-10-CM | POA: Diagnosis not present

## 2017-11-14 DIAGNOSIS — G2401 Drug induced subacute dyskinesia: Secondary | ICD-10-CM | POA: Diagnosis not present

## 2017-11-14 DIAGNOSIS — J188 Other pneumonia, unspecified organism: Secondary | ICD-10-CM | POA: Diagnosis not present

## 2017-11-14 DIAGNOSIS — S14122D Central cord syndrome at C2 level of cervical spinal cord, subsequent encounter: Secondary | ICD-10-CM | POA: Diagnosis not present

## 2017-11-14 DIAGNOSIS — Z79899 Other long term (current) drug therapy: Secondary | ICD-10-CM | POA: Diagnosis not present

## 2017-11-14 DIAGNOSIS — R41841 Cognitive communication deficit: Secondary | ICD-10-CM | POA: Diagnosis not present

## 2017-11-15 ENCOUNTER — Emergency Department (HOSPITAL_COMMUNITY): Payer: Medicare Other

## 2017-11-15 ENCOUNTER — Other Ambulatory Visit (HOSPITAL_COMMUNITY): Payer: Self-pay | Admitting: Internal Medicine

## 2017-11-15 ENCOUNTER — Inpatient Hospital Stay (HOSPITAL_COMMUNITY): Payer: Medicare Other

## 2017-11-15 ENCOUNTER — Other Ambulatory Visit: Payer: Self-pay | Admitting: Neurology

## 2017-11-15 ENCOUNTER — Other Ambulatory Visit: Payer: Self-pay

## 2017-11-15 ENCOUNTER — Encounter (HOSPITAL_COMMUNITY): Payer: Self-pay | Admitting: Emergency Medicine

## 2017-11-15 ENCOUNTER — Inpatient Hospital Stay (HOSPITAL_COMMUNITY)
Admission: EM | Admit: 2017-11-15 | Discharge: 2017-11-16 | DRG: 299 | Disposition: A | Discharge status: Skilled Nursing Facility | Payer: Medicare Other | Attending: Family Medicine | Admitting: Family Medicine

## 2017-11-15 DIAGNOSIS — R7989 Other specified abnormal findings of blood chemistry: Secondary | ICD-10-CM

## 2017-11-15 DIAGNOSIS — K5900 Constipation, unspecified: Secondary | ICD-10-CM | POA: Diagnosis not present

## 2017-11-15 DIAGNOSIS — R079 Chest pain, unspecified: Secondary | ICD-10-CM | POA: Diagnosis not present

## 2017-11-15 DIAGNOSIS — F209 Schizophrenia, unspecified: Secondary | ICD-10-CM | POA: Diagnosis present

## 2017-11-15 DIAGNOSIS — S14129A Central cord syndrome at unspecified level of cervical spinal cord, initial encounter: Secondary | ICD-10-CM | POA: Diagnosis present

## 2017-11-15 DIAGNOSIS — D500 Iron deficiency anemia secondary to blood loss (chronic): Secondary | ICD-10-CM | POA: Diagnosis present

## 2017-11-15 DIAGNOSIS — Z7982 Long term (current) use of aspirin: Secondary | ICD-10-CM

## 2017-11-15 DIAGNOSIS — E876 Hypokalemia: Secondary | ICD-10-CM | POA: Diagnosis present

## 2017-11-15 DIAGNOSIS — Z79899 Other long term (current) drug therapy: Secondary | ICD-10-CM | POA: Diagnosis not present

## 2017-11-15 DIAGNOSIS — I248 Other forms of acute ischemic heart disease: Secondary | ICD-10-CM | POA: Diagnosis present

## 2017-11-15 DIAGNOSIS — G8250 Quadriplegia, unspecified: Secondary | ICD-10-CM | POA: Diagnosis not present

## 2017-11-15 DIAGNOSIS — H269 Unspecified cataract: Secondary | ICD-10-CM | POA: Diagnosis present

## 2017-11-15 DIAGNOSIS — I82412 Acute embolism and thrombosis of left femoral vein: Principal | ICD-10-CM | POA: Diagnosis present

## 2017-11-15 DIAGNOSIS — I82402 Acute embolism and thrombosis of unspecified deep veins of left lower extremity: Secondary | ICD-10-CM | POA: Diagnosis not present

## 2017-11-15 DIAGNOSIS — I10 Essential (primary) hypertension: Secondary | ICD-10-CM | POA: Diagnosis not present

## 2017-11-15 DIAGNOSIS — R310 Gross hematuria: Secondary | ICD-10-CM | POA: Diagnosis present

## 2017-11-15 DIAGNOSIS — N319 Neuromuscular dysfunction of bladder, unspecified: Secondary | ICD-10-CM | POA: Diagnosis not present

## 2017-11-15 DIAGNOSIS — I351 Nonrheumatic aortic (valve) insufficiency: Secondary | ICD-10-CM | POA: Diagnosis not present

## 2017-11-15 DIAGNOSIS — S14129S Central cord syndrome at unspecified level of cervical spinal cord, sequela: Secondary | ICD-10-CM | POA: Diagnosis not present

## 2017-11-15 DIAGNOSIS — R0789 Other chest pain: Secondary | ICD-10-CM | POA: Diagnosis not present

## 2017-11-15 DIAGNOSIS — R279 Unspecified lack of coordination: Secondary | ICD-10-CM | POA: Diagnosis not present

## 2017-11-15 DIAGNOSIS — Z7401 Bed confinement status: Secondary | ICD-10-CM | POA: Diagnosis not present

## 2017-11-15 DIAGNOSIS — R0602 Shortness of breath: Secondary | ICD-10-CM | POA: Diagnosis not present

## 2017-11-15 LAB — I-STAT TROPONIN, ED: Troponin i, poc: 0.11 ng/mL (ref 0.00–0.08)

## 2017-11-15 LAB — CBC
HCT: 28.9 % — ABNORMAL LOW (ref 39.0–52.0)
Hemoglobin: 9 g/dL — ABNORMAL LOW (ref 13.0–17.0)
MCH: 25.9 pg — ABNORMAL LOW (ref 26.0–34.0)
MCHC: 31.1 g/dL (ref 30.0–36.0)
MCV: 83.3 fL (ref 78.0–100.0)
PLATELETS: 393 10*3/uL (ref 150–400)
RBC: 3.47 MIL/uL — ABNORMAL LOW (ref 4.22–5.81)
RDW: 14.7 % (ref 11.5–15.5)
WBC: 6.7 10*3/uL (ref 4.0–10.5)

## 2017-11-15 LAB — HEPATIC FUNCTION PANEL
ALT: 12 U/L — ABNORMAL LOW (ref 17–63)
AST: 16 U/L (ref 15–41)
Albumin: 2.7 g/dL — ABNORMAL LOW (ref 3.5–5.0)
Alkaline Phosphatase: 54 U/L (ref 38–126)
BILIRUBIN DIRECT: 0.1 mg/dL (ref 0.1–0.5)
BILIRUBIN INDIRECT: 0.3 mg/dL (ref 0.3–0.9)
BILIRUBIN TOTAL: 0.4 mg/dL (ref 0.3–1.2)
Total Protein: 6.5 g/dL (ref 6.5–8.1)

## 2017-11-15 LAB — TROPONIN I
TROPONIN I: 0.03 ng/mL — AB (ref <0.03)
Troponin I: <0.03 ng/mL (ref <0.03)

## 2017-11-15 LAB — MAGNESIUM: MAGNESIUM: 1.5 mg/dL — AB (ref 1.7–2.4)

## 2017-11-15 LAB — BASIC METABOLIC PANEL
Anion gap: 9 (ref 5–15)
BUN: 19 mg/dL (ref 6–20)
CO2: 23 mmol/L (ref 22–32)
CREATININE: 0.58 mg/dL — AB (ref 0.61–1.24)
Calcium: 8.9 mg/dL (ref 8.9–10.3)
Chloride: 101 mmol/L (ref 101–111)
Glucose, Bld: 154 mg/dL — ABNORMAL HIGH (ref 65–99)
POTASSIUM: 3.4 mmol/L — AB (ref 3.5–5.1)
SODIUM: 133 mmol/L — AB (ref 135–145)

## 2017-11-15 LAB — D-DIMER, QUANTITATIVE: D-Dimer, Quant: 7.47 ug/mL-FEU — ABNORMAL HIGH (ref 0.00–0.50)

## 2017-11-15 LAB — PHOSPHORUS: Phosphorus: 2.9 mg/dL (ref 2.5–4.6)

## 2017-11-15 MED ORDER — HEPARIN (PORCINE) IN NACL 100-0.45 UNIT/ML-% IJ SOLN
16.0000 [IU]/kg/h | INTRAMUSCULAR | Status: DC
  Administered 2017-11-15: 16 [IU]/kg/h via INTRAVENOUS
  Filled 2017-11-15: qty 250

## 2017-11-15 MED ORDER — SENNOSIDES-DOCUSATE SODIUM 8.6-50 MG PO TABS
2.0000 | ORAL_TABLET | Freq: Two times a day (BID) | ORAL | Status: DC
  Administered 2017-11-15 – 2017-11-16 (×2): 2 via ORAL
  Filled 2017-11-15 (×5): qty 2

## 2017-11-15 MED ORDER — ACETAMINOPHEN 325 MG PO TABS
650.0000 mg | ORAL_TABLET | Freq: Four times a day (QID) | ORAL | Status: DC | PRN
Start: 2017-11-15 — End: 2017-11-15

## 2017-11-15 MED ORDER — TIZANIDINE HCL 4 MG PO TABS
4.0000 mg | ORAL_TABLET | Freq: Four times a day (QID) | ORAL | Status: DC | PRN
  Filled 2017-11-15: qty 1

## 2017-11-15 MED ORDER — ACETAMINOPHEN 650 MG RE SUPP: 650.0000 mg | Freq: Four times a day (QID) | RECTAL | Status: DC | PRN

## 2017-11-15 MED ORDER — DOCUSATE SODIUM 100 MG PO CAPS
100.0000 mg | ORAL_CAPSULE | Freq: Every day | ORAL | Status: DC
  Administered 2017-11-16: 100 mg via ORAL
  Filled 2017-11-15 (×2): qty 1

## 2017-11-15 MED ORDER — VITAMIN C 500 MG PO TABS
500.0000 mg | ORAL_TABLET | Freq: Every day | ORAL | Status: DC
  Administered 2017-11-16: 500 mg via ORAL
  Filled 2017-11-15 (×3): qty 1

## 2017-11-15 MED ORDER — ACETAMINOPHEN 325 MG PO TABS: 650.0000 mg | ORAL_TABLET | Freq: Four times a day (QID) | ORAL | Status: DC | PRN

## 2017-11-15 MED ORDER — FERROUS SULFATE 325 (65 FE) MG PO TABS
325.0000 mg | ORAL_TABLET | Freq: Two times a day (BID) | ORAL | Status: DC
  Administered 2017-11-15 – 2017-11-16 (×2): 325 mg via ORAL
  Filled 2017-11-15 (×5): qty 1

## 2017-11-15 MED ORDER — HYDROCODONE-ACETAMINOPHEN 5-325 MG PO TABS
1.0000 | ORAL_TABLET | Freq: Four times a day (QID) | ORAL | Status: DC | PRN
  Administered 2017-11-16: 1 via ORAL
  Filled 2017-11-15: qty 1

## 2017-11-15 MED ORDER — ADULT MULTIVITAMIN W/MINERALS CH
1.0000 | ORAL_TABLET | Freq: Every day | ORAL | Status: DC
  Administered 2017-11-16: 1 via ORAL
  Filled 2017-11-15 (×3): qty 1

## 2017-11-15 MED ORDER — HEPARIN BOLUS VIA INFUSION
4000.0000 [IU] | Freq: Once | INTRAVENOUS | Status: AC
  Administered 2017-11-15: 4000 [IU] via INTRAVENOUS
  Filled 2017-11-15: qty 4000

## 2017-11-15 MED ORDER — GABAPENTIN 100 MG PO CAPS
100.0000 mg | ORAL_CAPSULE | Freq: Every day | ORAL | Status: DC
  Administered 2017-11-15: 100 mg via ORAL
  Filled 2017-11-15: qty 1

## 2017-11-15 MED ORDER — RISPERIDONE 0.5 MG PO TABS
0.5000 mg | ORAL_TABLET | Freq: Two times a day (BID) | ORAL | Status: DC
  Administered 2017-11-15 – 2017-11-16 (×2): 0.5 mg via ORAL
  Filled 2017-11-15 (×5): qty 1

## 2017-11-15 MED ORDER — ONDANSETRON HCL 4 MG PO TABS: 4.0000 mg | ORAL_TABLET | Freq: Four times a day (QID) | ORAL | Status: DC | PRN

## 2017-11-15 MED ORDER — TECHNETIUM TO 99M ALBUMIN AGGREGATED
4.0000 | Freq: Once | INTRAVENOUS | Status: AC | PRN
  Administered 2017-11-15: 4.2 via INTRAVENOUS

## 2017-11-15 MED ORDER — HEPARIN (PORCINE) IN NACL 100-0.45 UNIT/ML-% IJ SOLN: 1000.0000 [IU]/h | INTRAMUSCULAR | Status: DC

## 2017-11-15 MED ORDER — ONDANSETRON HCL 4 MG/2ML IJ SOLN: 4.0000 mg | Freq: Four times a day (QID) | INTRAMUSCULAR | Status: DC | PRN

## 2017-11-15 MED ORDER — PANTOPRAZOLE SODIUM 40 MG PO TBEC
40.0000 mg | DELAYED_RELEASE_TABLET | Freq: Every day | ORAL | Status: DC
  Administered 2017-11-15 – 2017-11-16 (×2): 40 mg via ORAL
  Filled 2017-11-15 (×3): qty 1

## 2017-11-15 MED ORDER — FLEET ENEMA 7-19 GM/118ML RE ENEM: 1.0000 | ENEMA | Freq: Every day | RECTAL | Status: DC | PRN

## 2017-11-15 MED ORDER — ACETAMINOPHEN 650 MG RE SUPP
650.0000 mg | Freq: Four times a day (QID) | RECTAL | Status: DC | PRN
Start: 2017-11-15 — End: 2017-11-15

## 2017-11-15 MED ORDER — HEPARIN BOLUS VIA INFUSION
4000.0000 [IU] | Freq: Once | INTRAVENOUS | Status: DC
  Filled 2017-11-15: qty 4000

## 2017-11-15 MED ORDER — SODIUM CHLORIDE 0.9 % IV SOLN: INTRAVENOUS | Status: DC

## 2017-11-15 MED ORDER — ALPRAZOLAM 0.25 MG PO TABS: 0.2500 mg | ORAL_TABLET | Freq: Two times a day (BID) | ORAL | Status: DC | PRN

## 2017-11-15 MED ORDER — HEPARIN (PORCINE) IN NACL 100-0.45 UNIT/ML-% IJ SOLN
1000.0000 [IU]/h | INTRAMUSCULAR | Status: DC
  Administered 2017-11-15: 1000 [IU]/h via INTRAVENOUS
  Filled 2017-11-15: qty 250

## 2017-11-15 MED ORDER — PANTOPRAZOLE SODIUM 40 MG PO TBEC: 40.0000 mg | DELAYED_RELEASE_TABLET | Freq: Every day | ORAL | Status: DC

## 2017-11-15 MED ORDER — ASPIRIN 81 MG PO CHEW
324.0000 mg | CHEWABLE_TABLET | Freq: Once | ORAL | Status: AC
  Administered 2017-11-15: 324 mg via ORAL
  Filled 2017-11-15: qty 4

## 2017-11-15 MED ORDER — BACLOFEN 10 MG PO TABS
5.0000 mg | ORAL_TABLET | Freq: Two times a day (BID) | ORAL | Status: DC
  Administered 2017-11-15 – 2017-11-16 (×2): 5 mg via ORAL
  Filled 2017-11-15 (×5): qty 1

## 2017-11-15 NOTE — Progress Notes (Signed)
CRITICAL VALUE ALERT  Critical Value:  Troponin 0.03  Date & Time Notied:  11/15/2017  Provider Notified: Dr. Dyann Kief  Orders Received/Actions taken:  Notified via text page

## 2017-11-15 NOTE — ED Notes (Signed)
CRITICAL VALUE ALERT  Critical Value:  I-Stat Troponin  Date & Time Notied:  Tuesday November 15 2017  Provider Notified: Dr. Lita Mains  Orders Received/Actions taken: None at this time

## 2017-11-15 NOTE — Plan of Care (Signed)
  Health Behavior/Discharge Planning: Ability to manage health-related needs will improve 11/15/2017 2341 - Progressing by Cassandria Anger, RN   Clinical Measurements: Ability to maintain clinical measurements within normal limits will improve 11/15/2017 2341 - Progressing by Cassandria Anger, RN Will remain free from infection 11/15/2017 2341 - Progressing by Cassandria Anger, RN Diagnostic test results will improve 11/15/2017 2341 - Progressing by Cassandria Anger, RN Respiratory complications will improve 11/15/2017 2341 - Progressing by Cassandria Anger, RN Cardiovascular complication will be avoided 11/15/2017 2341 - Progressing by Cassandria Anger, RN

## 2017-11-15 NOTE — ED Triage Notes (Addendum)
Pt fell before thanksgiving creating him to be paralyzed from neck down.  Was in outpatient to have xray of his neck but patient starting having central chest pain. No HX of cardiac issues.

## 2017-11-15 NOTE — Progress Notes (Signed)
Patient arrived to unit with IV occluded on arrival to unit.  2 RNs attempted IV.  AC notified and will attempt IV.  Dr. Dyann Kief notified.

## 2017-11-15 NOTE — Progress Notes (Addendum)
ANTICOAGULATION CONSULT NOTE - Consult  Pharmacy Consult for Heparin Indication: DVT  No Known Allergies  Patient Measurements: Height: 5\' 7"  (170.2 cm) Weight: 136 lb 11 oz (62 kg) IBW/kg (Calculated) : 66.1 HEPARIN DW (KG): 62  Vital Signs: Temp: 98 F (36.7 C) (02/26 1530) Temp Source: Oral (02/26 1530) BP: 140/60 (02/26 1530) Pulse Rate: 87 (02/26 1530)  Labs: Recent Labs    11/15/17 0921  HGB 9.0*  HCT 28.9*  PLT 393  CREATININE 0.58*    Estimated Creatinine Clearance: 76.4 mL/min (A) (by C-G formula based on SCr of 0.58 mg/dL (L)).   Medications:  Medications Prior to Admission  Medication Sig Dispense Refill Last Dose  . acetaminophen (TYLENOL) 325 MG tablet Take 2 tablets (650 mg total) by mouth every 6 (six) hours as needed for mild pain, moderate pain or fever.     . ALPRAZolam (XANAX) 0.25 MG tablet Take 1 tablet (0.25 mg total) by mouth 2 (two) times daily as needed for anxiety. 10 tablet 0 11/15/2017 at Unknown time  . aspirin EC 81 MG EC tablet Take 1 tablet (81 mg total) by mouth daily. 30 tablet 0 11/15/2017 at Unknown time  . Baclofen 5 MG TABS Take 5 mg by mouth 2 (two) times daily. 10 tablet 0 11/15/2017 at Unknown time  . clotrimazole (LOTRIMIN) 1 % cream Apply 1 application topically 2 (two) times daily. 14  Day course to left cheek for ring worm starting on 09/28/2017   10/05/2017 at Unknown time  . docusate sodium (COLACE) 100 MG capsule Take 100 mg by mouth daily.   11/15/2017 at Unknown time  . ferrous sulfate 325 (65 FE) MG tablet Take 325 mg by mouth daily with breakfast.   11/15/2017 at Unknown time  . gabapentin (NEURONTIN) 100 MG capsule Take 100 mg by mouth at bedtime.   11/14/2017 at Unknown time  . HYDROcodone-acetaminophen (NORCO/VICODIN) 5-325 MG tablet Take 1 tablet by mouth every 6 (six) hours as needed for moderate pain. 12 tablet 0 11/15/2017 at Unknown time  . Multiple Vitamins-Minerals (CERTAVITE SENIOR/ANTIOXIDANT) TABS Take 1 tablet by  mouth daily.   11/15/2017 at Unknown time  . risperiDONE (RISPERDAL) 0.5 MG tablet Take 1 tablet (0.5 mg total) by mouth 2 (two) times daily. 10 tablet 0 11/15/2017 at Unknown time  . senna-docusate (SENOKOT-S) 8.6-50 MG tablet Take 2 tablets by mouth 2 (two) times daily.   11/15/2017 at Unknown time  . sodium phosphate (FLEET) 7-19 GM/118ML ENEM Place 133 mLs (1 enema total) rectally daily as needed for severe constipation.  0 unknown  . sulfamethoxazole-trimethoprim (BACTRIM DS,SEPTRA DS) 800-160 MG tablet Take 1 tablet by mouth 2 (two) times daily.   11/15/2017 at Unknown time  . tiZANidine (ZANAFLEX) 4 MG tablet Take 1 tablet (4 mg total) by mouth every 6 (six) hours as needed for muscle spasms. 10 tablet 0 11/15/2017 at Unknown time  . vitamin C (ASCORBIC ACID) 500 MG tablet Take 500 mg by mouth daily.   11/15/2017 at Unknown time  . cephALEXin (KEFLEX) 500 MG capsule Take 1 capsule (500 mg total) by mouth 3 (three) times daily. (Patient not taking: Reported on 11/15/2017) 21 capsule 0 Completed Course at Unknown time    Assessment:  72 y.o. male patient with quadriplegia status post central cord syndrome was in the radiology department getting follow-up cervical spine films when he developed central chest pain.  States the pain has somewhat improved and was not associated with shortness of breath or cough.  No recent fever or chills.  Patient has had some left lower extremity swelling and pain. Doppler ultrasound of the left leg is positive for DVT. Pharmacy will manage heparin dosing.  Goal of Therapy:  Heparin level 0.3-0.7 units/ml Monitor platelets by anticoagulation protocol: Yes   Plan:  Give 4000 units bolus x 1 Start heparin infusion at 100 units/hr Check anti-Xa level in 6-8 hours and daily while on heparin Continue to monitor H&H and platelets  Isac Sarna, BS Vena Austria, BCPS Clinical Pharmacist Pager [phone] 11/15/2017,4:12 PM   Addendum: Patient lost IV access for several  hours, bolus and heparin infusion entered, lovenox was never given. D/W RN and will implement orders once patient returns from radiology. 11/15/17 1954

## 2017-11-15 NOTE — H&P (Addendum)
History and Physical    Dean Oliver PIR:[id number] DOB: 1946-06-05 DOA: 11/15/2017  PCP: Patient, No Pcp Per   I have briefly reviewed patients previous medical reports in [facility]  Patient coming from: came from [facility]  Chief Complaint: chest discomfort and LLE swelling/pain.  HPI: Dean Oliver is a 71 year old with past medical history significant for hypertension, schizophrenia, central cord syndrome, quadriplegia; who presented to the emergency department secondary to chest discomfort.  Patient reports an acute sudden chest discomfort in the middle of his chest slightly radiated to the left side, with associated shortness of breath.  No palpitations, no diaphoresis, no cough, no nausea, no vomiting, no further recurrence of the chest discomfort at this moment.  Patient quadriplegics of the pain happens at rest.  Of note, he also experienced pain in his left lower extremity and has been described by skilled nursing facilities and increasing swelling.  Patient denies hematuria, dysuria, abdominal pain, or any other acute complaints.   ED Course: Patient had an EKG not demonstrating acute ischemic process, mild elevation of troponin appreciated.  Patient with elevated d-dimer and had a new acute left lower extremity DVT.  High concerns for pulmonary embolism.  TRH has been consulted to admit the patient for further evaluation and treatment.  Started on heparin drip.  Review of Systems:  All other systems reviewed and apart from HPI, are negative.  Past Medical History:  Diagnosis Date  . Cataracts, bilateral   . HTN (hypertension)   . Quadriplegia ([place])   . Schizophrenia [facility])     Past Surgical History:  Procedure Laterality Date  . None      Social History  reports that  has never smoked. he has never used smokeless tobacco. He reports that he does not drink alcohol or use drugs.  No Known Allergies  Family History  -Positive for hypertension and  hyperlipidemia -Otherwise noncontributory.   Prior to Admission medications   Medication Sig Start Date End Date Taking? Authorizing Provider  acetaminophen (TYLENOL) 325 MG tablet Take 2 tablets (650 mg total) by mouth every 6 (six) hours as needed for mild pain, moderate pain or fever. 10/10/17  Yes Johnson, Clanford L, MD  ALPRAZolam (XANAX) 0.25 MG tablet Take 1 tablet (0.25 mg total) by mouth 2 (two) times daily as needed for anxiety. 10/10/17  Yes Johnson, Clanford L, MD  aspirin EC 81 MG EC tablet Take 1 tablet (81 mg total) by mouth daily. 08/18/17  Yes Allie Bossier, MD  Baclofen 5 MG TABS Take 5 mg by mouth 2 (two) times daily. 09/07/17  Yes Angiulli, Lavon Paganini, PA-C  clotrimazole (LOTRIMIN) 1 % cream Apply 1 application topically 2 (two) times daily. 14  Day course to left cheek for ring worm starting on 09/28/2017   Yes [provider]  docusate sodium (COLACE) 100 MG capsule Take 100 mg by mouth daily.   Yes [provider]  ferrous sulfate 325 (65 FE) MG tablet Take 325 mg by mouth daily with breakfast.   Yes [provider]  gabapentin (NEURONTIN) 100 MG capsule Take 100 mg by mouth at bedtime.   Yes [provider]  HYDROcodone-acetaminophen (NORCO/VICODIN) 5-325 MG tablet Take 1 tablet by mouth every 6 (six) hours as needed for moderate pain. 10/10/17  Yes Johnson, Clanford L, MD  Multiple Vitamins-Minerals (CERTAVITE SENIOR/ANTIOXIDANT) TABS Take 1 tablet by mouth daily.   Yes [provider]  risperiDONE (RISPERDAL) 0.5 MG tablet Take 1 tablet (  0.5 mg total) by mouth 2 (two) times daily. 09/07/17  Yes Angiulli, Lavon Paganini, PA-C  senna-docusate (SENOKOT-S) 8.6-50 MG tablet Take 2 tablets by mouth 2 (two) times daily. 10/10/17  Yes Johnson, Clanford L, MD  sodium phosphate (FLEET) 7-19 GM/118ML ENEM Place 133 mLs (1 enema total) rectally daily as needed for severe constipation. 09/07/17  Yes Angiulli, Lavon Paganini, PA-C    sulfamethoxazole-trimethoprim (BACTRIM DS,SEPTRA DS) 800-160 MG tablet Take 1 tablet by mouth 2 (two) times daily.   Yes [provider]  tiZANidine (ZANAFLEX) 4 MG tablet Take 1 tablet (4 mg total) by mouth every 6 (six) hours as needed for muscle spasms. 09/07/17  Yes Angiulli, Lavon Paganini, PA-C  vitamin C (ASCORBIC ACID) 500 MG tablet Take 500 mg by mouth daily.   Yes [provider]  cephALEXin (KEFLEX) 500 MG capsule Take 1 capsule (500 mg total) by mouth 3 (three) times daily. Patient not taking: Reported on 11/15/2017 11/10/17   Tanna Furry, MD    Physical Exam: Vitals:   11/15/17 1400 11/15/17 1430 11/15/17 1500 11/15/17 1530  BP: 128/87 (!) 125/58 129/73 140/60  Pulse: 78 81 78 87  Resp: 17 16 15 20   Temp:      TempSrc:      SpO2: 98% 100% 99% 99%  Weight:       Constitutional: Afebrile, no nausea, no vomiting.  Patient currently denying chest discomfort and reports no difficulty breathing. Eyes: PERTLA, lids and conjunctivae normal ENMT: Mucous membranes are moist. Posterior pharynx clear of any exudate or lesions. No thrush.  Neck: supple, no masses, no thyromegaly, no JVD Respiratory: clear to auscultation bilaterally, no wheezing, no crackles. Normal respiratory effort. No accessory muscle use.  Cardiovascular: S1 & S2 heard, regular rate and rhythm, no murmurs / rubs / gallops. No extremity edema. 2+ pedal pulses. No carotid bruits.  Abdomen: No distension, no tenderness, no masses palpated. No hepatosplenomegaly. Bowel sounds normal.  Musculoskeletal: no cyanosis, no clubbing, left lower extremity swelling and tender to palpation; right lower extremity with some swelling but less than his left lower extremity. Skin: No rash, no petechiae, no open wounds appreciated currently.  Neurologic: CN 2-12 grossly intact.  Patient is quadriplegic and essentially able to move only his head and neck.  Son atrophic changes appreciated on his limbs. Psychiatric: Normal  judgment and fairly good insight. Alert and oriented x 3.  Stable mood.  No hallucinations, no suicidal ideation currently.   Labs on Admission: I have personally reviewed following labs and imaging studies  CBC: Recent Labs  Lab 11/15/17 0921  WBC 6.7  HGB 9.0*  HCT 28.9*  MCV 83.3  PLT 132   Basic Metabolic Panel: Recent Labs  Lab 11/15/17 0921  NA 133*  K 3.4*  CL 101  CO2 23  GLUCOSE 154*  BUN 19  CREATININE 0.58*  CALCIUM 8.9   Urine analysis:    Component Value Date/Time   COLORURINE YELLOW 11/10/2017 2000   APPEARANCEUR HAZY (A) 11/10/2017 2000   LABSPEC 1.009 11/10/2017 2000   PHURINE 6.0 11/10/2017 2000   GLUCOSEU NEGATIVE 11/10/2017 2000   HGBUR MODERATE (A) 11/10/2017 2000   BILIRUBINUR NEGATIVE 11/10/2017 2000   KETONESUR NEGATIVE 11/10/2017 2000   PROTEINUR NEGATIVE 11/10/2017 2000   NITRITE NEGATIVE 11/10/2017 2000   LEUKOCYTESUR LARGE (A) 11/10/2017 2000    Radiological Exams on Admission: US Venous Img Lower Unilateral Left  Result Date: 11/15/2017 CLINICAL DATA:  Edema, pain, altered mental status EXAM: LEFT LOWER  EXTREMITY VENOUS DOPPLER ULTRASOUND TECHNIQUE: Gray-scale sonography with compression, as well as color and duplex ultrasound, were performed to evaluate the deep venous system from the level of the common femoral vein through the popliteal and proximal calf veins. COMPARISON:  None FINDINGS: There is noncompressible occlusive thrombus in the left common femoral vein extending across the saphenofemoral junction. There is partially occlusive thrombus in the profunda vein. The femoral vein however appears patent as is the popliteal vein and visualized calf veins. There is subcutaneous edema in the calf. Survey views of the contralateral common femoral vein are unremarkable. IMPRESSION: 1. POSITIVE for left lower extremity DVT involving profunda femoral vein and common femoral vein. Electronically Signed   By: Lucrezia Europe M.D.   On: 11/15/2017  13:23   Dg Chest Port 1 View  Result Date: 11/15/2017 CLINICAL DATA:  Mid chest pain. History of hypertension, quadriplegia, schizophrenia. EXAM: PORTABLE CHEST 1 VIEW COMPARISON:  Portable chest x-ray of October 05, 2017 FINDINGS: The lungs are borderline hypoinflated. There is no focal infiltrate. There is no pleural effusion or pneumothorax. The cardiac silhouette is enlarged but accentuated by the AP portable technique. The pulmonary vascularity is prominent centrally. There is prominence of the mediastinum which is accentuated by the portable technique as well. IMPRESSION: Cardiomegaly. Mild central pulmonary vascular congestion. No definite pulmonary edema. Electronically Signed   By: David  Martinique M.D.   On: 11/15/2017 12:19    EKG:  Sinus rhythm, normal axis, mild LVH by voltage.  Assessment/Plan 1-Chest pain: in the setting of elevated d-dimer and also new left lower extremity DVT.  Patient with high wells criteria. Having mild elevation of I-stat troponin (most likely demand ischemia) -will admit to telemetry -check 2-D echo -Cycle troponin and repeat EKG in am -will also check V/Q scan -initial plan was to start patient on heparin; but due to lack of IV will use lovenox for VTE treatment (pharmacy to dose) -follow Hgb trend (recent presentation with hematuria and underlying hx of anemia)  2-Schizophrenia ([place]):  -patient mood stable -continue risperdal  -continue PRN xanax  3-Benign essential HTN -not on antihypertensive meds at home -will follow VS  4-Central cord syndrome ([place]) with quadriplegia  -continue supportive care -will ask for overlay mattress and prevalon boots -continue baclofen and zanaflex  5-Neurogenic bladder -with recent urinary infectiona dn hematuria -has completed antibiotic therapy -no visible blood seen in foley bag -continue foley catheter (recently exchanged)  6-GERD and GI prophylaxis -will use PPI   7-constipation -continue colace and  senokot  -PRN fleet enema for severe constipation.  8-hypokalemia -will replete electrolytes as needed  9-anemia -with recent component of anemia of blood loss -will continue ferrous sulfate -follow Hgb trend   Time: 65 minutes   DVT prophylaxis:  Code Status:  Family Communication:   Disposition Plan:  Consults called:  Admission status:     Barton Dubois MD [facility] Pager [phone]  If 7PM-7AM, please contact night-coverage [email or web address] Password [facility]  11/15/2017, 3:43 PM

## 2017-11-15 NOTE — ED Provider Notes (Signed)
[facility] EMERGENCY DEPARTMENT Provider Note   CSN: [id number] Arrival date & time: 11/15/17  5638     History   Chief Complaint Chief Complaint  Patient presents with  . Chest Pain    HPI Dean Oliver is a 72 y.o. male.  HPI Patient with quadriplegia status post central cord syndrome was in the radiology department getting follow-up cervical spine films when he developed central chest pain.  States the pain has somewhat improved.  Not associated with shortness of breath or cough.  No recent fever or chills.  Patient has had some left lower extremity swelling and pain.  He also was recently evaluated for hematuria and treated for UTI. Past Medical History:  Diagnosis Date  . Cataracts, bilateral   . HTN (hypertension)   . Quadriplegia ([place])   . Schizophrenia [facility])     Patient Active Problem List   Diagnosis Date Noted  . Pressure injury of skin 10/06/2017  . Fever in adult   . Gross hematuria   . Urinary tract infection with hematuria   . Quadriplegia ([place]) 10/05/2017  . Sepsis secondary to UTI ([place]) 10/05/2017  . AKI (acute kidney injury) ([place]) 10/05/2017  . HCAP (healthcare-associated pneumonia) 10/05/2017  . Neurogenic bladder 08/18/2017  . Neurogenic bowel 08/18/2017  . Bacterial UTI 08/18/2017  . Central cord syndrome ([place]) 08/17/2017  . Dysphagia   . Fall   . Hypotension   . Sinus bradycardia   . HNP (herniated nucleus pulposus) with myelopathy, cervical   . Benign essential HTN   . Hyponatremia   . Acute blood loss anemia   . Symptomatic bradycardia 08/12/2017  . Schizophrenia [facility])     Past Surgical History:  Procedure Laterality Date  . None         Home Medications    Prior to Admission medications   Medication Sig Start Date End Date Taking? Authorizing Provider  acetaminophen (TYLENOL) 325 MG tablet Take 2 tablets (650 mg total) by mouth every 6 (six) hours as needed for mild pain, moderate pain or fever. 10/10/17  Yes Johnson, Clanford  L, MD  ALPRAZolam (XANAX) 0.25 MG tablet Take 1 tablet (0.25 mg total) by mouth 2 (two) times daily as needed for anxiety. 10/10/17  Yes Johnson, Clanford L, MD  aspirin EC 81 MG EC tablet Take 1 tablet (81 mg total) by mouth daily. 08/18/17  Yes Allie Bossier, MD  Baclofen 5 MG TABS Take 5 mg by mouth 2 (two) times daily. 09/07/17  Yes Angiulli, Lavon Paganini, PA-C  clotrimazole (LOTRIMIN) 1 % cream Apply 1 application topically 2 (two) times daily. 14  Day course to left cheek for ring worm starting on 09/28/2017   Yes [provider]  docusate sodium (COLACE) 100 MG capsule Take 100 mg by mouth daily.   Yes [provider]  ferrous sulfate 325 (65 FE) MG tablet Take 325 mg by mouth daily with breakfast.   Yes [provider]  gabapentin (NEURONTIN) 100 MG capsule Take 100 mg by mouth at bedtime.   Yes [provider]  HYDROcodone-acetaminophen (NORCO/VICODIN) 5-325 MG tablet Take 1 tablet by mouth every 6 (six) hours as needed for moderate pain. 10/10/17  Yes Johnson, Clanford L, MD  Multiple Vitamins-Minerals (CERTAVITE SENIOR/ANTIOXIDANT) TABS Take 1 tablet by mouth daily.   Yes [provider]  risperiDONE (RISPERDAL) 0.5 MG tablet Take 1 tablet (0.5 mg total) by mouth 2 (two) times daily. 09/07/17  Yes Angiulli, Lavon Paganini, PA-C  senna-docusate (  SENOKOT-S) 8.6-50 MG tablet Take 2 tablets by mouth 2 (two) times daily. 10/10/17  Yes Johnson, Clanford L, MD  sodium phosphate (FLEET) 7-19 GM/118ML ENEM Place 133 mLs (1 enema total) rectally daily as needed for severe constipation. 09/07/17  Yes Angiulli, Lavon Paganini, PA-C  sulfamethoxazole-trimethoprim (BACTRIM DS,SEPTRA DS) 800-160 MG tablet Take 1 tablet by mouth 2 (two) times daily.   Yes [provider]  tiZANidine (ZANAFLEX) 4 MG tablet Take 1 tablet (4 mg total) by mouth every 6 (six) hours as needed for muscle spasms. 09/07/17  Yes Angiulli, Lavon Paganini, PA-C  vitamin C (ASCORBIC ACID) 500 MG tablet  Take 500 mg by mouth daily.   Yes [provider]  cephALEXin (KEFLEX) 500 MG capsule Take 1 capsule (500 mg total) by mouth 3 (three) times daily. Patient not taking: Reported on 11/15/2017 11/10/17   Tanna Furry, MD    Family History Family History  Family history unknown: Yes    Social History Social History   Tobacco Use  . Smoking status: Never Smoker  . Smokeless tobacco: Never Used  Substance Use Topics  . Alcohol use: No    Frequency: Never  . Drug use: No     Allergies   Patient has no known allergies.   Review of Systems Review of Systems  Constitutional: Negative for chills, fatigue and fever.  HENT: Negative for trouble swallowing.   Respiratory: Negative for cough, shortness of breath and wheezing.   Cardiovascular: Positive for chest pain and leg swelling. Negative for palpitations.  Gastrointestinal: Negative for abdominal pain, constipation, diarrhea, nausea and vomiting.  Genitourinary: Positive for difficulty urinating. Negative for hematuria and penile pain.  Musculoskeletal: Positive for myalgias. Negative for back pain, joint swelling, neck pain and neck stiffness.  Skin: Negative for rash and wound.  Neurological: Negative for dizziness, light-headedness and headaches.  All other systems reviewed and are negative.    Physical Exam Updated Vital Signs BP 113/68   Pulse 74   Temp 98 F (36.7 C) (Oral)   Resp 20   Wt 62.1 kg (137 lb)   SpO2 100%   BMI 21.46 kg/m   Physical Exam  Constitutional: He is oriented to person, place, and time. He appears well-developed and well-nourished. No distress.  HENT:  Head: Normocephalic and atraumatic.  Mouth/Throat: Oropharynx is clear and moist. No oropharyngeal exudate.  Eyes: EOM are normal. Pupils are equal, round, and reactive to light.  Neck: Normal range of motion. Neck supple.  Cardiovascular: Normal rate and regular rhythm. Exam reveals no gallop and no friction rub.  No murmur  heard. Pulmonary/Chest: Effort normal and breath sounds normal. No stridor. No respiratory distress. He has no wheezes. He has no rales. He exhibits no tenderness.  Abdominal: Soft. Bowel sounds are normal. There is no tenderness. There is no rebound and no guarding.  Genitourinary:  Genitourinary Comments: Foley catheter in place draining cloudy urine  Musculoskeletal: Normal range of motion. He exhibits no edema or tenderness.  Left calf swelling compared to right.  Patient has posterior calf and plantar surface tenderness to palpation.  Distal pulses are 2+.  Lymphadenopathy:    He has no cervical adenopathy.  Neurological: He is alert and oriented to person, place, and time.  Quadriplegia with minimal movement of the right lower extremity.  Skin: Skin is warm and dry. Capillary refill takes less than 2 seconds. No rash noted. He is not diaphoretic. No erythema.  Psychiatric: He has a normal mood and affect. His  behavior is normal.  Nursing note and vitals reviewed.    ED Treatments / Results  Labs (all labs ordered are listed, but only abnormal results are displayed) Labs Reviewed  BASIC METABOLIC PANEL - Abnormal; Notable for the following components:      Result Value   Sodium 133 (*)    Potassium 3.4 (*)    Glucose, Bld 154 (*)    Creatinine, Ser 0.58 (*)    All other components within normal limits  CBC - Abnormal; Notable for the following components:   RBC 3.47 (*)    Hemoglobin 9.0 (*)    HCT 28.9 (*)    MCH 25.9 (*)    All other components within normal limits  D-DIMER, QUANTITATIVE (NOT AT [facility]) - Abnormal; Notable for the following components:   D-Dimer, Quant 7.47 (*)    All other components within normal limits  I-STAT TROPONIN, ED - Abnormal; Notable for the following components:   Troponin i, poc 0.11 (*)    All other components within normal limits  I-STAT TROPONIN, ED    EKG  EKG Interpretation  Date/Time:  Tuesday November 15 2017 09:10:53  EST Ventricular Rate:  94 PR Interval:    QRS Duration: 93 QT Interval:  364 QTC Calculation: 456 R Axis:   33 Text Interpretation:  Sinus rhythm Confirmed by Julianne Rice [phone]) on 11/15/2017 9:27:15 AM       Radiology US Venous Img Lower Unilateral Left  Result Date: 11/15/2017 CLINICAL DATA:  Edema, pain, altered mental status EXAM: LEFT LOWER EXTREMITY VENOUS DOPPLER ULTRASOUND TECHNIQUE: Gray-scale sonography with compression, as well as color and duplex ultrasound, were performed to evaluate the deep venous system from the level of the common femoral vein through the popliteal and proximal calf veins. COMPARISON:  None FINDINGS: There is noncompressible occlusive thrombus in the left common femoral vein extending across the saphenofemoral junction. There is partially occlusive thrombus in the profunda vein. The femoral vein however appears patent as is the popliteal vein and visualized calf veins. There is subcutaneous edema in the calf. Survey views of the contralateral common femoral vein are unremarkable. IMPRESSION: 1. POSITIVE for left lower extremity DVT involving profunda femoral vein and common femoral vein. Electronically Signed   By: Lucrezia Europe M.D.   On: 11/15/2017 13:23   Dg Chest Port 1 View  Result Date: 11/15/2017 CLINICAL DATA:  Mid chest pain. History of hypertension, quadriplegia, schizophrenia. EXAM: PORTABLE CHEST 1 VIEW COMPARISON:  Portable chest x-ray of October 05, 2017 FINDINGS: The lungs are borderline hypoinflated. There is no focal infiltrate. There is no pleural effusion or pneumothorax. The cardiac silhouette is enlarged but accentuated by the AP portable technique. The pulmonary vascularity is prominent centrally. There is prominence of the mediastinum which is accentuated by the portable technique as well. IMPRESSION: Cardiomegaly. Mild central pulmonary vascular congestion. No definite pulmonary edema. Electronically Signed   By: Donnamae [place]ilenburg  Martinique M.D.   On:  11/15/2017 12:19    Procedures Procedures (including critical care time)  Medications Ordered in ED Medications  heparin ADULT infusion 100 units/mL (25000 units/27mL sodium chloride 0.45%) (not administered)  aspirin chewable tablet 324 mg (324 mg Oral Given 11/15/17 1041)     Initial Impression / Assessment and Plan / ED Course  I have reviewed the triage vital signs and the nursing notes.  Pertinent labs & imaging results that were available during my care of the patient were reviewed by me and considered in my medical decision making (  see chart for details).    Doppler ultrasound of the left leg is positive for DVT.  Patient has mildly elevated troponin.  No ischemic changes on EKG.  Vital signs are stable.  Maintaining saturations 100% on room air.  There was difficulty obtaining more central access for CT angiogram study.  Patient likely is having chest pain due to a pulmonary embolus.  Will start on heparin.  Discussed with hospitalist will see patient in the emergency department and admit.   Final Clinical Impressions(s) / ED Diagnoses   Final diagnoses:  Nonspecific chest pain  Deep vein thrombosis (DVT) of left lower extremity, unspecified chronicity, unspecified vein [facility])    ED Discharge Orders    None       Julianne Rice, MD 11/15/17 1353

## 2017-11-15 NOTE — ED Notes (Signed)
PT's Healthcare POA: [facility] [phone]).  PT from [facility]) [phone].

## 2017-11-16 ENCOUNTER — Inpatient Hospital Stay (HOSPITAL_COMMUNITY): Payer: Medicare Other

## 2017-11-16 DIAGNOSIS — S14129S Central cord syndrome at unspecified level of cervical spinal cord, sequela: Secondary | ICD-10-CM

## 2017-11-16 DIAGNOSIS — F209 Schizophrenia, unspecified: Secondary | ICD-10-CM

## 2017-11-16 DIAGNOSIS — I351 Nonrheumatic aortic (valve) insufficiency: Secondary | ICD-10-CM

## 2017-11-16 DIAGNOSIS — G8250 Quadriplegia, unspecified: Secondary | ICD-10-CM

## 2017-11-16 DIAGNOSIS — I82402 Acute embolism and thrombosis of unspecified deep veins of left lower extremity: Secondary | ICD-10-CM | POA: Diagnosis present

## 2017-11-16 DIAGNOSIS — N319 Neuromuscular dysfunction of bladder, unspecified: Secondary | ICD-10-CM

## 2017-11-16 DIAGNOSIS — R079 Chest pain, unspecified: Secondary | ICD-10-CM

## 2017-11-16 DIAGNOSIS — I10 Essential (primary) hypertension: Secondary | ICD-10-CM

## 2017-11-16 LAB — BASIC METABOLIC PANEL
ANION GAP: 9 (ref 5–15)
BUN: 13 mg/dL (ref 6–20)
CHLORIDE: 103 mmol/L (ref 101–111)
CO2: 23 mmol/L (ref 22–32)
Calcium: 8.6 mg/dL — ABNORMAL LOW (ref 8.9–10.3)
Creatinine, Ser: 0.49 mg/dL — ABNORMAL LOW (ref 0.61–1.24)
GFR calc Af Amer: >60 mL/min (ref >60)
GFR calc non Af Amer: >60 mL/min (ref >60)
Glucose, Bld: 93 mg/dL (ref 65–99)
Potassium: 3.7 mmol/L (ref 3.5–5.1)
SODIUM: 135 mmol/L (ref 135–145)

## 2017-11-16 LAB — CBC
HEMATOCRIT: 27.5 % — AB (ref 39.0–52.0)
HEMOGLOBIN: 8.6 g/dL — AB (ref 13.0–17.0)
MCH: 26.4 pg (ref 26.0–34.0)
MCHC: 31.3 g/dL (ref 30.0–36.0)
MCV: 84.4 fL (ref 78.0–100.0)
PLATELETS: 396 10*3/uL (ref 150–400)
RBC: 3.26 MIL/uL — AB (ref 4.22–5.81)
RDW: 14.6 % (ref 11.5–15.5)
WBC: 6 10*3/uL (ref 4.0–10.5)

## 2017-11-16 LAB — ECHOCARDIOGRAM COMPLETE
HEIGHTINCHES: 67 in
Weight: 2186.96 oz

## 2017-11-16 LAB — HEPARIN LEVEL (UNFRACTIONATED): Heparin Unfractionated: 0.32 IU/mL (ref 0.30–0.70)

## 2017-11-16 LAB — MAGNESIUM: MAGNESIUM: 1.5 mg/dL — AB (ref 1.7–2.4)

## 2017-11-16 LAB — MRSA PCR SCREENING: MRSA by PCR: INVALID — AB

## 2017-11-16 LAB — TROPONIN I: Troponin I: <0.03 ng/mL (ref <0.03)

## 2017-11-16 MED ORDER — APIXABAN 5 MG PO TABS: ORAL_TABLET | ORAL | Status: DC

## 2017-11-16 MED ORDER — APIXABAN 5 MG PO TABS: 5.0000 mg | ORAL_TABLET | Freq: Two times a day (BID) | ORAL | Status: DC

## 2017-11-16 MED ORDER — MAGNESIUM SULFATE 2 GM/50ML IV SOLN: 2.0000 g | Freq: Once | INTRAVENOUS | Status: DC

## 2017-11-16 MED ORDER — HYDROCODONE-ACETAMINOPHEN 5-325 MG PO TABS: 1.0000 | ORAL_TABLET | Freq: Four times a day (QID) | ORAL | 0 refills | Status: DC | PRN

## 2017-11-16 MED ORDER — ALPRAZOLAM 0.25 MG PO TABS: 0.2500 mg | ORAL_TABLET | Freq: Two times a day (BID) | ORAL | 0 refills | Status: DC | PRN

## 2017-11-16 MED ORDER — APIXABAN 5 MG PO TABS: 10.0000 mg | ORAL_TABLET | Freq: Two times a day (BID) | ORAL | Status: DC

## 2017-11-16 NOTE — Clinical Social Work Note (Signed)
Clinical Social Work Assessment  Patient Details  Name: Dean Oliver MRN: [id number] Date of Birth: Feb 15, 1946  Date of referral:  11/16/17               Reason for consult:  Facility Placement                Permission sought to share information with:    Permission granted to share information::     Name::        Agency::  [facility]  Relationship::     Contact Information:     Housing/Transportation Living arrangements for the past 2 months:  [place] of Information:  Facility Patient Interpreter Needed:  None Criminal Activity/Legal Involvement Pertinent to Current Situation/Hospitalization:  No - Comment as needed Significant Relationships:  Adult Children Lives with:  Facility Resident Do you feel safe going back to the place where you live?  Yes Need for family participation in patient care:  Yes (Comment)  Care giving concerns: No concerns identified.    Social Worker assessment / plan:  Patient has been at [facility] 09/07/17. He requires extensive to total assistance for all ADLs.  Patient uses a wheelchair and his family is supportive. Patient can return to the facility at discharge.   Employment status:  Retired Forensic scientist:  Medicare PT Recommendations:  Not assessed at this time Information / Referral to community resources:     Patient/Family's Response to care:  Patient is a long term resident.   Patient/Family's Understanding of and Emotional Response to Diagnosis, Current Treatment, and Prognosis:  Family has been made aware of patient's diagnosis, treatment and prognosis.   Emotional Assessment Appearance:  Appears stated age Attitude/Demeanor/Rapport:    Affect (typically observed):  Unable to Assess Orientation:  Oriented to Self, Oriented to Place Alcohol / Substance use:  Not Applicable Psych involvement (Current and /or in the community):  No (Comment)  Discharge Needs  Concerns to be addressed:   Other (Comment Required(return to [facility].) Readmission within the last 30 days:  No Current discharge risk:  None Barriers to Discharge:  No Barriers Identified   Ihor Gully, LCSW 11/16/2017, 10:47 AM

## 2017-11-16 NOTE — Discharge Summary (Signed)
Physician Discharge Summary  Dean Oliver ALP:[id number] DOB: 17-Aug-1946 DOA: 11/15/2017  PCP: Patient, No Pcp Per  Admit date: 11/15/2017 Discharge date: 11/16/2017  Admitted From: SNF Disposition: SNF  Recommended SNF Orders:  1. Eliquis take 10 mg BID thru 11/23/17 then on 11/24/17 start 5 mg BID. 2. Monitor for bleeding complications while on Eliquis.  3. Consider stopping eliquis after 3 months of treatment.  4. Please obtain CBC in 3 days and again in 7 days and regularly follow Hemoglobin after that.   5. Please resume frequent turning in bed every 2 hours and wound care.   6. Routine foley catheter care.   Discharge Condition: STABLE   CODE STATUS: FULL    Brief Hospitalization Summary: Please see all hospital notes, images, labs for full details of the hospitalization. HPI: Dean Oliver is a 72 year old with past medical history significant for hypertension, schizophrenia, central cord syndrome, quadriplegia; who presented to the emergency department secondary to chest discomfort.  Patient reports an acute sudden chest discomfort in the middle of his chest slightly radiated to the left side, with associated shortness of breath.  No palpitations, no diaphoresis, no cough, no nausea, no vomiting, no further recurrence of the chest discomfort at this moment.  Patient quadriplegics of the pain happens at rest.  Of note, he also experienced pain in his left lower extremity and has been described by skilled nursing facilities and increasing swelling.  Patient denies hematuria, dysuria, abdominal pain, or any other acute complaints.  ED Course: Patient had an EKG not demonstrating acute ischemic process, mild elevation of troponin appreciated.  Patient with elevated d-dimer and had a new acute left lower extremity DVT.  High concerns for pulmonary embolism.  TRH has been consulted to admit the patient for further evaluation and treatment.  Started on heparin drip.  The patient was  admitted for acute DVT of the LLE.  He was started on a heparin infusion which he tolerated well with no bleeding complications.  He was transitioned over to oral Eliquis with instructions to take 10 mg twice daily for 10 days and then transition to 5 mg twice daily.  He will need to be monitored for bleeding complications on these medications.  He will need to have periodic hemoglobin and platelet testing.  His V/Q scan inconclusive.  He has been hemodynamically stable and no longer complaining of chest pain.  He is stable to discharge back to skilled nursing facility for ongoing care.  His troponins have been negative.  He will be resuming his previous home medications.  Discharge Diagnoses:  Principal Problem:   Chest pain Active Problems:   Schizophrenia (HCC)   Benign essential HTN   Central cord syndrome (HCC)   Neurogenic bladder   Quadriplegia (HCC)   Gross hematuria  Discharge Instructions: Discharge Instructions    Call MD for:  difficulty breathing, headache or visual disturbances   Complete by:  As directed    Call MD for:  extreme fatigue   Complete by:  As directed    Call MD for:  persistant dizziness or light-headedness   Complete by:  As directed    Call MD for:  severe uncontrolled pain   Complete by:  As directed    Increase activity slowly   Complete by:  As directed      Allergies as of 11/16/2017   No Known Allergies     Medication List    STOP taking these medications   cephALEXin 500 MG  capsule Commonly known as:  KEFLEX   clotrimazole 1 % cream Commonly known as:  LOTRIMIN   sulfamethoxazole-trimethoprim 800-160 MG tablet Commonly known as:  BACTRIM DS,SEPTRA DS     TAKE these medications   acetaminophen 325 MG tablet Commonly known as:  TYLENOL Take 2 tablets (650 mg total) by mouth every 6 (six) hours as needed for mild pain, moderate pain or fever.   ALPRAZolam 0.25 MG tablet Commonly known as:  XANAX Take 1 tablet (0.25 mg total) by mouth  2 (two) times daily as needed for anxiety.   apixaban 5 MG Tabs tablet Commonly known as:  ELIQUIS Take 2 po BID thru 3/6, then on 3/7 start 1 po BID.   aspirin 81 MG EC tablet Take 1 tablet (81 mg total) by mouth daily.   Baclofen 5 MG Tabs Take 5 mg by mouth 2 (two) times daily.   CERTAVITE SENIOR/ANTIOXIDANT Tabs Take 1 tablet by mouth daily.   docusate sodium 100 MG capsule Commonly known as:  COLACE Take 100 mg by mouth daily.   ferrous sulfate 325 (65 FE) MG tablet Take 325 mg by mouth daily with breakfast.   gabapentin 100 MG capsule Commonly known as:  NEURONTIN Take 100 mg by mouth at bedtime.   HYDROcodone-acetaminophen 5-325 MG tablet Commonly known as:  NORCO/VICODIN Take 1 tablet by mouth every 6 (six) hours as needed for severe pain. What changed:  reasons to take this   risperiDONE 0.5 MG tablet Commonly known as:  RISPERDAL Take 1 tablet (0.5 mg total) by mouth 2 (two) times daily.   senna-docusate 8.6-50 MG tablet Commonly known as:  Senokot-S Take 2 tablets by mouth 2 (two) times daily.   sodium phosphate 7-19 GM/118ML Enem Place 133 mLs (1 enema total) rectally daily as needed for severe constipation.   tiZANidine 4 MG tablet Commonly known as:  ZANAFLEX Take 1 tablet (4 mg total) by mouth every 6 (six) hours as needed for muscle spasms.   vitamin C 500 MG tablet Commonly known as:  ASCORBIC ACID Take 500 mg by mouth daily.       No Known Allergies Allergies as of 11/16/2017   No Known Allergies     Medication List    STOP taking these medications   cephALEXin 500 MG capsule Commonly known as:  KEFLEX   clotrimazole 1 % cream Commonly known as:  LOTRIMIN   sulfamethoxazole-trimethoprim 800-160 MG tablet Commonly known as:  BACTRIM DS,SEPTRA DS     TAKE these medications   acetaminophen 325 MG tablet Commonly known as:  TYLENOL Take 2 tablets (650 mg total) by mouth every 6 (six) hours as needed for mild pain, moderate pain or  fever.   ALPRAZolam 0.25 MG tablet Commonly known as:  XANAX Take 1 tablet (0.25 mg total) by mouth 2 (two) times daily as needed for anxiety.   apixaban 5 MG Tabs tablet Commonly known as:  ELIQUIS Take 2 po BID thru 3/6, then on 3/7 start 1 po BID.   aspirin 81 MG EC tablet Take 1 tablet (81 mg total) by mouth daily.   Baclofen 5 MG Tabs Take 5 mg by mouth 2 (two) times daily.   CERTAVITE SENIOR/ANTIOXIDANT Tabs Take 1 tablet by mouth daily.   docusate sodium 100 MG capsule Commonly known as:  COLACE Take 100 mg by mouth daily.   ferrous sulfate 325 (65 FE) MG tablet Take 325 mg by mouth daily with breakfast.   gabapentin 100 MG capsule  Commonly known as:  NEURONTIN Take 100 mg by mouth at bedtime.   HYDROcodone-acetaminophen 5-325 MG tablet Commonly known as:  NORCO/VICODIN Take 1 tablet by mouth every 6 (six) hours as needed for severe pain. What changed:  reasons to take this   risperiDONE 0.5 MG tablet Commonly known as:  RISPERDAL Take 1 tablet (0.5 mg total) by mouth 2 (two) times daily.   senna-docusate 8.6-50 MG tablet Commonly known as:  Senokot-S Take 2 tablets by mouth 2 (two) times daily.   sodium phosphate 7-19 GM/118ML Enem Place 133 mLs (1 enema total) rectally daily as needed for severe constipation.   tiZANidine 4 MG tablet Commonly known as:  ZANAFLEX Take 1 tablet (4 mg total) by mouth every 6 (six) hours as needed for muscle spasms.   vitamin C 500 MG tablet Commonly known as:  ASCORBIC ACID Take 500 mg by mouth daily.       Procedures/Studies: Nm Pulmonary Perfusion  Result Date: 11/15/2017 CLINICAL DATA:  Left lower extremity DVT. Evaluate for pulmonary embolism. Patient denies chest pain and shortness of breath. EXAM: NUCLEAR MEDICINE VENTILATION AND PERFUSION SCAN TECHNIQUE: Perfusion images were obtained in multiple projections after intravenous injection of radiopharmaceutical. Ventilation images were not obtained as the patient  was unable to satisfactorily breathe in the DTPA. RADIOPHARMACEUTICALS:  4.2 mCi Tc108m MAA-IV COMPARISON:  Chest x-ray from same day. FINDINGS: Perfusion: There is a single segmental perfusion defect involving the right upper lobe apical segment. Additional nonsegmental perfusion defects are seen in both lungs on the the lateral views. IMPRESSION: 1. Single segmental perfusion defect involving the right upper lobe apical segment. Because ventilation images were not obtained secondary to the patient's inability to satisfactorily breathe in the DTPA, this finding is technically non-diagnostic based on the perfusion only modified PIOPED II criteria. Consider repeat V/Q scan or CTA chest for further evaluation. Electronically Signed   By: Titus Dubin M.D.   On: 11/15/2017 21:00   US Venous Img Lower Unilateral Left  Result Date: 11/15/2017 CLINICAL DATA:  Edema, pain, altered mental status EXAM: LEFT LOWER EXTREMITY VENOUS DOPPLER ULTRASOUND TECHNIQUE: Gray-scale sonography with compression, as well as color and duplex ultrasound, were performed to evaluate the deep venous system from the level of the common femoral vein through the popliteal and proximal calf veins. COMPARISON:  None FINDINGS: There is noncompressible occlusive thrombus in the left common femoral vein extending across the saphenofemoral junction. There is partially occlusive thrombus in the profunda vein. The femoral vein however appears patent as is the popliteal vein and visualized calf veins. There is subcutaneous edema in the calf. Survey views of the contralateral common femoral vein are unremarkable. IMPRESSION: 1. POSITIVE for left lower extremity DVT involving profunda femoral vein and common femoral vein. Electronically Signed   By: Lucrezia Europe M.D.   On: 11/15/2017 13:23   Dg Chest Port 1 View  Result Date: 11/15/2017 CLINICAL DATA:  Mid chest pain. History of hypertension, quadriplegia, schizophrenia. EXAM: PORTABLE CHEST 1 VIEW  COMPARISON:  Portable chest x-ray of October 05, 2017 FINDINGS: The lungs are borderline hypoinflated. There is no focal infiltrate. There is no pleural effusion or pneumothorax. The cardiac silhouette is enlarged but accentuated by the AP portable technique. The pulmonary vascularity is prominent centrally. There is prominence of the mediastinum which is accentuated by the portable technique as well. IMPRESSION: Cardiomegaly. Mild central pulmonary vascular congestion. No definite pulmonary edema. Electronically Signed   By: David  Martinique M.D.   On: 11/15/2017  12:19   Dg Foot Complete Left  Result Date: 11/10/2017 CLINICAL DATA:  Chronic left foot pain.  Unsure of injury. EXAM: LEFT FOOT - COMPLETE 3+ VIEW COMPARISON:  None. FINDINGS: Limited by osteopenia that is prominent. There is soft tissue swelling about the foot and ankle that is nonspecific. No opaque foreign body or soft tissue gas. No definite acute fracture; calcific density between the bases of the first and second metatarsals is atherosclerotic. No dislocation. IMPRESSION: 1. Soft tissue swelling without gas or opaque foreign body. 2. No convincing fracture. Sensitivity of radiography is diminished by degree of osteopenia. Electronically Signed   By: Monte Fantasia M.D.   On: 11/10/2017 21:57      Subjective: Pt without complaints.   Discharge Exam: Vitals:   11/15/17 2156 11/16/17 0613  BP: 113/63 125/65  Pulse: 74 83  Resp:    Temp: 97.8 F (36.6 C) 98.3 F (36.8 C)  SpO2: 100% 100%   Vitals:   11/15/17 1600 11/15/17 1624 11/15/17 2156 11/16/17 0613  BP:  (!) 123/59 113/63 125/65  Pulse:  99 74 83  Resp:  20    Temp:  98 F (36.7 C) 97.8 F (36.6 C) 98.3 F (36.8 C)  TempSrc:  Oral Oral Oral  SpO2:  99% 100% 100%  Weight: 62 kg (136 lb 11 oz)     Height: 5\' 7"  (1.702 m)      General: Pt is alert, awake, not in acute distress, he is quadriplegic. Cardiovascular: RRR, S1/S2 +, no rubs, no gallops Respiratory: CTA  bilaterally, no wheezing, no rhonchi Abdominal: Soft, NT, ND, bowel sounds + Extremities: mild edema LLE. no cyanosis and good pedal pulses.   The results of significant diagnostics from this hospitalization (including imaging, microbiology, ancillary and laboratory) are listed below for reference.     Microbiology: Recent Results (from the past 240 hour(s))  Urine Culture     Status: Abnormal   Collection Time: 11/10/17  8:00 PM  Result Value Ref Range Status   Specimen Description   Final    URINE, CATHETERIZED Performed at [facility], [address], [place], [place] [address]    Special Requests   Final    NONE Performed at [facility], [address] [place], [place] [address]    Culture 10,000 COLONIES/mL PROTEUS MIRABILIS (A)  Final   Report Status 11/13/2017 FINAL  Final   Organism ID, Bacteria PROTEUS MIRABILIS (A)  Final      Susceptibility   Proteus mirabilis - MIC*    AMPICILLIN <=2 SENSITIVE Sensitive     CEFAZOLIN 8 SENSITIVE Sensitive     CEFTRIAXONE <=1 SENSITIVE Sensitive     CIPROFLOXACIN >=4 RESISTANT Resistant     GENTAMICIN <=1 SENSITIVE Sensitive     IMIPENEM 4 SENSITIVE Sensitive     NITROFURANTOIN 128 RESISTANT Resistant     TRIMETH/SULFA <=20 SENSITIVE Sensitive     AMPICILLIN/SULBACTAM <=2 SENSITIVE Sensitive     PIP/TAZO <=4 SENSITIVE Sensitive     * 10,000 COLONIES/mL PROTEUS MIRABILIS  MRSA PCR Screening     Status: Abnormal   Collection Time: 11/15/17  6:10 PM  Result Value Ref Range Status   MRSA by PCR INVALID RESULTS, SPECIMEN SENT FOR CULTURE (A) NEGATIVE Final    Comment:        The GeneXpert MRSA Assay (FDA approved for NASAL specimens only), is one component of a comprehensive MRSA colonization surveillance program. It is not intended to diagnose MRSA infection nor to guide  or monitor treatment for MRSA infections. Performed at [facility], [address] [place], [place] [address]      Labs: BNP (last 3 results) No  results for input(s): BNP in the last 8760 hours. Basic Metabolic Panel: Recent Labs  Lab 11/15/17 0921 11/15/17 1614 11/16/17 0630  NA 133*  --  135  K 3.4*  --  3.7  CL 101  --  103  CO2 23  --  23  GLUCOSE 154*  --  93  BUN 19  --  13  CREATININE 0.58*  --  0.49*  CALCIUM 8.9  --  8.6*  MG  --  1.5* 1.5*  PHOS  --  2.9  --    Liver Function Tests: Recent Labs  Lab 11/15/17 1614  AST 16  ALT 12*  ALKPHOS 54  BILITOT 0.4  PROT 6.5  ALBUMIN 2.7*   No results for input(s): LIPASE, AMYLASE in the last 168 hours. No results for input(s): AMMONIA in the last 168 hours. CBC: Recent Labs  Lab 11/15/17 0921 11/16/17 0441  WBC 6.7 6.0  HGB 9.0* 8.6*  HCT 28.9* 27.5*  MCV 83.3 84.4  PLT 393 396   Cardiac Enzymes: Recent Labs  Lab 11/15/17 1614 11/15/17 2122 11/16/17 0441  TROPONINI 0.03* <0.03 <0.03   BNP: Invalid input(s): POCBNP CBG: No results for input(s): GLUCAP in the last 168 hours. D-Dimer Recent Labs    11/15/17 0921  DDIMER 7.47*   Hgb A1c No results for input(s): HGBA1C in the last 72 hours. Lipid Profile No results for input(s): CHOL, HDL, LDLCALC, TRIG, CHOLHDL, LDLDIRECT in the last 72 hours. Thyroid function studies No results for input(s): TSH, T4TOTAL, T3FREE, THYROIDAB in the last 72 hours.  Invalid input(s): FREET3 Anemia work up No results for input(s): VITAMINB12, FOLATE, FERRITIN, TIBC, IRON, RETICCTPCT in the last 72 hours. Urinalysis    Component Value Date/Time   COLORURINE YELLOW 11/10/2017 2000   APPEARANCEUR HAZY (A) 11/10/2017 2000   LABSPEC 1.009 11/10/2017 2000   PHURINE 6.0 11/10/2017 2000   GLUCOSEU NEGATIVE 11/10/2017 2000   HGBUR MODERATE (A) 11/10/2017 2000   BILIRUBINUR NEGATIVE 11/10/2017 2000   KETONESUR NEGATIVE 11/10/2017 2000   PROTEINUR NEGATIVE 11/10/2017 2000   NITRITE NEGATIVE 11/10/2017 2000   LEUKOCYTESUR LARGE (A) 11/10/2017 2000   Sepsis Labs Invalid input(s): PROCALCITONIN,  WBC,   LACTICIDVEN Microbiology Recent Results (from the past 240 hour(s))  Urine Culture     Status: Abnormal   Collection Time: 11/10/17  8:00 PM  Result Value Ref Range Status   Specimen Description   Final    URINE, CATHETERIZED Performed at [facility], [address] [place], [place] [address]    Special Requests   Final    NONE Performed at [facility], [address], [place], [place] [address]    Culture 10,000 COLONIES/mL PROTEUS MIRABILIS (A)  Final   Report Status 11/13/2017 FINAL  Final   Organism ID, Bacteria PROTEUS MIRABILIS (A)  Final      Susceptibility   Proteus mirabilis - MIC*    AMPICILLIN <=2 SENSITIVE Sensitive     CEFAZOLIN 8 SENSITIVE Sensitive     CEFTRIAXONE <=1 SENSITIVE Sensitive     CIPROFLOXACIN >=4 RESISTANT Resistant     GENTAMICIN <=1 SENSITIVE Sensitive     IMIPENEM 4 SENSITIVE Sensitive     NITROFURANTOIN 128 RESISTANT Resistant     TRIMETH/SULFA <=20 SENSITIVE Sensitive     AMPICILLIN/SULBACTAM <=2 SENSITIVE Sensitive  PIP/TAZO <=4 SENSITIVE Sensitive     * 10,000 COLONIES/mL PROTEUS MIRABILIS  MRSA PCR Screening     Status: Abnormal   Collection Time: 11/15/17  6:10 PM  Result Value Ref Range Status   MRSA by PCR INVALID RESULTS, SPECIMEN SENT FOR CULTURE (A) NEGATIVE Final    Comment:        The GeneXpert MRSA Assay (FDA approved for NASAL specimens only), is one component of a comprehensive MRSA colonization surveillance program. It is not intended to diagnose MRSA infection nor to guide or monitor treatment for MRSA infections. Performed at [facility], [address] [place], [place] [address]    Time coordinating discharge:   SIGNED:  Irwin Brakeman, MD  [facility] 11/16/2017, 11:26 AM Pager [phone]  If 7PM-7AM, please contact night-coverage [email or web address] Password [email or web address]

## 2017-11-16 NOTE — Progress Notes (Signed)
ANTICOAGULATION CONSULT NOTE - Consult  Pharmacy Consult for Heparin Indication: DVT  No Known Allergies  Patient Measurements: Height: 5\' 7"  (170.2 cm) Weight: 136 lb 11 oz (62 kg) IBW/kg (Calculated) : 66.1 HEPARIN DW (KG): 62  Vital Signs: Temp: 98.3 F (36.8 C) (02/27 0613) Temp Source: Oral (02/27 0613) BP: 125/65 (02/27 3810) Pulse Rate: 83 (02/27 0613)  Labs: Recent Labs    11/15/17 0921 11/15/17 1614 11/15/17 2122 11/16/17 0441 11/16/17 0630  HGB 9.0*  --   --  8.6*  --   HCT 28.9*  --   --  27.5*  --   PLT 393  --   --  396  --   HEPARINUNFRC  --   --   --  0.32  --   CREATININE 0.58*  --   --   --  0.49*  TROPONINI  --  0.03* <0.03 <0.03  --     Estimated Creatinine Clearance: 76.4 mL/min (A) (by C-G formula based on SCr of 0.49 mg/dL (L)).   Medications:  Medications Prior to Admission  Medication Sig Dispense Refill Last Dose  . acetaminophen (TYLENOL) 325 MG tablet Take 2 tablets (650 mg total) by mouth every 6 (six) hours as needed for mild pain, moderate pain or fever.     . ALPRAZolam (XANAX) 0.25 MG tablet Take 1 tablet (0.25 mg total) by mouth 2 (two) times daily as needed for anxiety. 10 tablet 0 11/15/2017 at Unknown time  . aspirin EC 81 MG EC tablet Take 1 tablet (81 mg total) by mouth daily. 30 tablet 0 11/15/2017 at Unknown time  . Baclofen 5 MG TABS Take 5 mg by mouth 2 (two) times daily. 10 tablet 0 11/15/2017 at Unknown time  . clotrimazole (LOTRIMIN) 1 % cream Apply 1 application topically 2 (two) times daily. 14  Day course to left cheek for ring worm starting on 09/28/2017   10/05/2017 at Unknown time  . docusate sodium (COLACE) 100 MG capsule Take 100 mg by mouth daily.   11/15/2017 at Unknown time  . ferrous sulfate 325 (65 FE) MG tablet Take 325 mg by mouth daily with breakfast.   11/15/2017 at Unknown time  . gabapentin (NEURONTIN) 100 MG capsule Take 100 mg by mouth at bedtime.   11/14/2017 at Unknown time  . HYDROcodone-acetaminophen  (NORCO/VICODIN) 5-325 MG tablet Take 1 tablet by mouth every 6 (six) hours as needed for moderate pain. 12 tablet 0 11/15/2017 at Unknown time  . Multiple Vitamins-Minerals (CERTAVITE SENIOR/ANTIOXIDANT) TABS Take 1 tablet by mouth daily.   11/15/2017 at Unknown time  . risperiDONE (RISPERDAL) 0.5 MG tablet Take 1 tablet (0.5 mg total) by mouth 2 (two) times daily. 10 tablet 0 11/15/2017 at Unknown time  . senna-docusate (SENOKOT-S) 8.6-50 MG tablet Take 2 tablets by mouth 2 (two) times daily.   11/15/2017 at Unknown time  . sodium phosphate (FLEET) 7-19 GM/118ML ENEM Place 133 mLs (1 enema total) rectally daily as needed for severe constipation.  0 unknown  . sulfamethoxazole-trimethoprim (BACTRIM DS,SEPTRA DS) 800-160 MG tablet Take 1 tablet by mouth 2 (two) times daily.   11/15/2017 at Unknown time  . tiZANidine (ZANAFLEX) 4 MG tablet Take 1 tablet (4 mg total) by mouth every 6 (six) hours as needed for muscle spasms. 10 tablet 0 11/15/2017 at Unknown time  . vitamin C (ASCORBIC ACID) 500 MG tablet Take 500 mg by mouth daily.   11/15/2017 at Unknown time  . cephALEXin (KEFLEX) 500 MG capsule Take  1 capsule (500 mg total) by mouth 3 (three) times daily. (Patient not taking: Reported on 11/15/2017) 21 capsule 0 Completed Course at Unknown time    Assessment:  72 y.o. male patient with quadriplegia status post central cord syndrome was in the radiology department getting follow-up cervical spine films when he developed central chest pain.  States the pain has somewhat improved and was not associated with shortness of breath or cough.  No recent fever or chills.  Patient has had some left lower extremity swelling and pain. Doppler ultrasound of the left leg is positive for DVT. Pharmacy will manage heparin dosing. Heparin level therapeutic this AM  Goal of Therapy:  Heparin level 0.3-0.7 units/ml Monitor platelets by anticoagulation protocol: Yes   Plan:  Continue heparin infusion at 1000 units/hr Check  anti-Xa level daily while on heparin F/U plan for oral anticoagulation Continue to monitor H&H and platelets  Isac Sarna, BS [place], BCPS Clinical Pharmacist Pager [phone] 11/16/2017,7:39 AM

## 2017-11-16 NOTE — Progress Notes (Signed)
ANTICOAGULATION CONSULT NOTE - Consult  Pharmacy Consult for Heparin-->eliquis Indication: DVT  No Known Allergies  Patient Measurements: Height: 5\' 7"  (170.2 cm) Weight: 136 lb 11 oz (62 kg) IBW/kg (Calculated) : 66.1 HEPARIN DW (KG): 62  Vital Signs: Temp: 98.3 F (36.8 C) (02/27 0613) Temp Source: Oral (02/27 0613) BP: 125/65 (02/27 6761) Pulse Rate: 83 (02/27 0613)  Labs: Recent Labs    11/15/17 0921 11/15/17 1614 11/15/17 2122 11/16/17 0441 11/16/17 0630  HGB 9.0*  --   --  8.6*  --   HCT 28.9*  --   --  27.5*  --   PLT 393  --   --  396  --   HEPARINUNFRC  --   --   --  0.32  --   CREATININE 0.58*  --   --   --  0.49*  TROPONINI  --  0.03* <0.03 <0.03  --     Estimated Creatinine Clearance: 76.4 mL/min (A) (by C-G formula based on SCr of 0.49 mg/dL (L)).   Medications:  Medications Prior to Admission  Medication Sig Dispense Refill Last Dose  . acetaminophen (TYLENOL) 325 MG tablet Take 2 tablets (650 mg total) by mouth every 6 (six) hours as needed for mild pain, moderate pain or fever.     . ALPRAZolam (XANAX) 0.25 MG tablet Take 1 tablet (0.25 mg total) by mouth 2 (two) times daily as needed for anxiety. 10 tablet 0 11/15/2017 at Unknown time  . aspirin EC 81 MG EC tablet Take 1 tablet (81 mg total) by mouth daily. 30 tablet 0 11/15/2017 at Unknown time  . Baclofen 5 MG TABS Take 5 mg by mouth 2 (two) times daily. 10 tablet 0 11/15/2017 at Unknown time  . clotrimazole (LOTRIMIN) 1 % cream Apply 1 application topically 2 (two) times daily. 14  Day course to left cheek for ring worm starting on 09/28/2017   10/05/2017 at Unknown time  . docusate sodium (COLACE) 100 MG capsule Take 100 mg by mouth daily.   11/15/2017 at Unknown time  . ferrous sulfate 325 (65 FE) MG tablet Take 325 mg by mouth daily with breakfast.   11/15/2017 at Unknown time  . gabapentin (NEURONTIN) 100 MG capsule Take 100 mg by mouth at bedtime.   11/14/2017 at Unknown time  .  HYDROcodone-acetaminophen (NORCO/VICODIN) 5-325 MG tablet Take 1 tablet by mouth every 6 (six) hours as needed for moderate pain. 12 tablet 0 11/15/2017 at Unknown time  . Multiple Vitamins-Minerals (CERTAVITE SENIOR/ANTIOXIDANT) TABS Take 1 tablet by mouth daily.   11/15/2017 at Unknown time  . risperiDONE (RISPERDAL) 0.5 MG tablet Take 1 tablet (0.5 mg total) by mouth 2 (two) times daily. 10 tablet 0 11/15/2017 at Unknown time  . senna-docusate (SENOKOT-S) 8.6-50 MG tablet Take 2 tablets by mouth 2 (two) times daily.   11/15/2017 at Unknown time  . sodium phosphate (FLEET) 7-19 GM/118ML ENEM Place 133 mLs (1 enema total) rectally daily as needed for severe constipation.  0 unknown  . sulfamethoxazole-trimethoprim (BACTRIM DS,SEPTRA DS) 800-160 MG tablet Take 1 tablet by mouth 2 (two) times daily.   11/15/2017 at Unknown time  . tiZANidine (ZANAFLEX) 4 MG tablet Take 1 tablet (4 mg total) by mouth every 6 (six) hours as needed for muscle spasms. 10 tablet 0 11/15/2017 at Unknown time  . vitamin C (ASCORBIC ACID) 500 MG tablet Take 500 mg by mouth daily.   11/15/2017 at Unknown time  . cephALEXin (KEFLEX) 500 MG capsule Take  1 capsule (500 mg total) by mouth 3 (three) times daily. (Patient not taking: Reported on 11/15/2017) 21 capsule 0 Completed Course at Unknown time    Assessment:  72 y.o. male patient with quadriplegia status post central cord syndrome was in the radiology department getting follow-up cervical spine films when he developed central chest pain.  States the pain has somewhat improved and was not associated with shortness of breath or cough.  No recent fever or chills.  Patient has had some left lower extremity swelling and pain. Doppler ultrasound of the left leg is positive for DVT. Pharmacy asked to now transition patient to eliquis for treatment of DVT.   Goal of Therapy:  Monitor platelets by anticoagulation protocol: Yes   Plan:  Eliquis 10mg  po bid x 7 days, then 5mg  po  BID Education for eliquis Continue to monitor H&H and platelets  Isac Sarna, BS Vena [place], BCPS Clinical Pharmacist Pager [phone] 11/16/2017,10:57 AM

## 2017-11-16 NOTE — Clinical Social Work Note (Signed)
Spoke with pt's RN about pt's dc. Provided envelope for dc clinical. RN states she will call for EMS when pt is ready. Transport form provided. Debbie at [facility] was updated earlier. No other SW needs for dc.

## 2017-11-16 NOTE — Clinical Social Work Note (Signed)
Debbie at [facility] informed that patient will discharge today.    Shalae Belmonte, Clydene Pugh, LCSW

## 2017-11-16 NOTE — Discharge Instructions (Signed)
Follow with Primary MD  Patient, No Pcp Per  and other consultant's as instructed your Hospitalist MD  Please get a complete blood count and chemistry panel checked by your Primary MD at your next visit, and again as instructed by your Primary MD.  Get Medicines reviewed and adjusted: Please take all your medications with you for your next visit with your Primary MD  Laboratory/radiological data: Please request your Primary MD to go over all hospital tests and procedure/radiological results at the follow up, please ask your Primary MD to get all Hospital records sent to his/her office.  In some cases, they will be blood work, cultures and biopsy results pending at the time of your discharge. Please request that your primary care M.D. follows up on these results.  Also Note the following: If you experience worsening of your admission symptoms, develop shortness of breath, life threatening emergency, suicidal or homicidal thoughts you must seek medical attention immediately by calling 911 or calling your MD immediately  if symptoms less severe.  You must read complete instructions/literature along with all the possible adverse reactions/side effects for all the Medicines you take and that have been prescribed to you. Take any new Medicines after you have completely understood and accpet all the possible adverse reactions/side effects.   Do not drive when taking Pain medications or sleeping medications (Benzodaizepines)  Do not take more than prescribed Pain, Sleep and Anxiety Medications. It is not advisable to combine anxiety,sleep and pain medications without talking with your primary care practitioner  Special Instructions: If you have smoked or chewed Tobacco  in the last 2 yrs please stop smoking, stop any regular Alcohol  and or any Recreational drug use.  Wear Seat belts while driving.  Please note: You were cared for by a hospitalist during your hospital stay. Once you are discharged,  your primary care physician will handle any further medical issues. Please note that NO REFILLS for any discharge medications will be authorized once you are discharged, as it is imperative that you return to your primary care physician (or establish a relationship with a primary care physician if you do not have one) for your post hospital discharge needs so that they can reassess your need for medications and monitor your lab values.      Bleeding Precautions When on Anticoagulant Therapy WHAT IS ANTICOAGULANT THERAPY? Anticoagulant therapy is taking medicine to prevent or reduce blood clots. It is also called blood thinner therapy. Blood clots that form in your blood vessels can be dangerous. They can break loose and travel to your heart, lungs, or brain. This increases your risk of a heart attack or stroke. Anticoagulant therapy causes blood to clot more slowly. You may need anticoagulant therapy if you have:  A medical condition that increases the likelihood that blood clots will form.  A heart defect or a problem with heart rhythm. It is also a common treatment after heart surgery, such as valve replacement. WHAT ARE COMMON TYPES OF ANTICOAGULANT THERAPY? Anticoagulant medicine can be injected or taken by mouth.If you need anticoagulant therapy quickly at the hospital, the medicine may be injected under your skin or given through an IV tube. Heparin is a common example of an anticoagulant that you may get at the hospital. Most anticoagulant therapy is in the form of pills that you take at home every day. These may include:  Aspirin. This common blood thinner works by preventing blood cells (platelets) from sticking together to form a clot. Aspirin  is not as strong as anticoagulants that slow down the time that it takes for your body to form a clot.  Clopidogrel. This is a newer type of drug that affects platelets. It is stronger than aspirin.  Warfarin. This is the most common  anticoagulant. It changes the way your body uses vitamin K, a vitamin that helps your blood to clot. The risk of bleeding is higher with warfarin than with aspirin. You will need frequent blood tests to make sure you are taking the safest amount.  New anticoagulants. Several new drugs have been approved. They are all taken by mouth. Studies show that these drugs work as well as warfarin. They do not require blood testing. They may cause less bleeding risk than warfarin. WHAT DO I NEED TO REMEMBER WHEN TAKING ANTICOAGULANT THERAPY? Anticoagulant therapy decreases your risk of forming a blood clot, but it increases your risk of bleeding. Work closely with your health care provider to make sure you are taking your medicine safely. These tips can help:  Learn ways to reduce your risk of bleeding.  If you are taking warfarin: ? Have blood tests as ordered by your health care provider. ? Do not make any sudden changes to your diet. Vitamin K in your diet can make warfarin less effective. ? Do not get pregnant. This medicine may cause birth defects.  Take your medicine at the same time every day. If you forget to take your medicine, take it as soon as you remember. If you miss a whole day, do not double your dose of medicine. Take your normal dose and call your health care provider to check in.  Do not stop taking your medicine on your own.  Tell your health care provider before you start taking any new medicine, vitamin, or herbal product. Some of these could interfere with your therapy.  Tell all of your health care providers that you are on anticoagulant therapy.  Do not have surgery, medical procedures, or dental work until you tell your health care provider that you are on anticoagulant therapy. WHAT CAN AFFECT HOW ANTICOAGULANTS WORK? Certain foods, vitamins, medicines, supplements, and herbal medicines change the way that anticoagulant therapy works. They may increase or decrease the effects  of your anticoagulant therapy. Either result can be dangerous for you.  Many over-the-counter medicines for pain, colds, or stomach problems interfere with anticoagulant therapy. Take these only as told by your health care provider.  Do not drink alcohol. It can interfere with your medicine and increase your risk of an injury that causes bleeding.  If you are taking warfarin, do not begin eating more foods that contain vitamin K. These include leafy green vegetables. Ask your health care provider if you should avoid any foods. WHAT ARE SOME WAYS TO PREVENT BLEEDING? You can prevent bleeding by taking certain precautions:  Be extra careful when you use knives, scissors, or other sharp objects.  Use an electric razor instead of a blade.  Do not use toothpicks.  Use a soft toothbrush.  Wear shoes that have nonskid soles.  Use bath mats and handrails in your bathroom.  Wear gloves while you do yard work.  Wear a helmet when you ride a bike.  Wear your seat belt.  Prevent falls by removing loose rugs and extension cords from areas where you walk.  Do not play contact sports or participate in other activities that have a high risk of injury. [place] PROVIDER? Call your  health care provider if:  You miss a dose of medicine: ? And you are not sure what to do. ? For more than one day.  You have: ? Menstrual bleeding that is heavier than normal. ? Blood in your urine. ? A bloody nose or bleeding gums. ? Easy bruising. ? Blood in your stool (feces) or have black and tarry stool. ? Side effects from your medicine.  You feel weak or dizzy.  You become pregnant. Seek immediate medical care if:  You have bleeding that will not stop.  You have sudden and severe headache or belly pain.  You vomit or you cough up bright red blood.  You have a severe blow to your head. WHAT ARE SOME QUESTIONS TO ASK MY HEALTH CARE PROVIDER?  What is the best  anticoagulant therapy for my condition?  What side effects should I watch for?  When should I take my medicine? What should I do if I forget to take it?  Will I need to have regular blood tests?  Do I need to change my diet? Are there foods or drinks that I should avoid?  What activities are safe for me?  What should I do if I want to get pregnant? This information is not intended to replace advice given to you by your health care provider. Make sure you discuss any questions you have with your health care provider. Document Released: 08/18/2015 Document Reviewed: 08/18/2015 Elsevier Interactive Patient Education  2017 [place].   Deep Vein Thrombosis Deep vein thrombosis (DVT) is a condition in which a blood clot forms in a deep vein, such as a lower leg, thigh, or arm vein. A clot is blood that has thickened into a gel or solid. This condition is dangerous. It can lead to serious and even life-threatening complications if the clot travels to the lungs and causes a blockage (pulmonary embolism). It can also damage veins in the leg. This can result in leg pain, swelling, discoloration, and sores (post-thrombotic syndrome). What are the causes? This condition may be caused by:  A slowdown of blood flow.  Damage to a vein.  A condition that makes blood clot more easily.  What increases the risk? The following factors may make you more likely to develop this condition:  Being overweight.  Being elderly, especially over age 66.  Sitting or lying down for more than four hours.  Lack of physical activity (sedentary lifestyle).  Being pregnant, giving birth, or having recently given birth.  Taking medicines that contain estrogen.  Smoking.  A history of any of the following: ? Blood clots or blood clotting disease. ? Peripheral vascular disease. ? Inflammatory bowel disease. ? Cancer. ? Heart disease. ? Genetic conditions that affect how blood clots. ? Neurological  diseases that affect the legs (leg paresis). ? Injury. ? Major or lengthy surgery. ? A central line placed inside a large vein.  What are the signs or symptoms? Symptoms of this condition include:  Swelling, pain, or tenderness in an arm or leg.  Warmth, redness, or discoloration in an arm or leg.  If the clot is in your leg, symptoms may be more noticeable or worse when you stand or walk. Some people do not have any symptoms. How is this diagnosed? This condition is diagnosed with:  A medical history.  A physical exam.  Tests, such as: ? Blood tests. These are done to see how your blood clots. ? Imaging tests. These are done to check for clots.  Tests may include:  Ultrasound.  CT scan.  MRI.  X-ray.  Venogram. For this test, X-rays are taken after a dye is injected into a vein.  How is this treated? Treatment for this condition depends on the cause, your risk for bleeding or developing more clots, and any medical conditions you have. Treatment may include:  Taking blood thinners (also called anticoagulants). These medicines may be taken by mouth, injected under the skin, or injected through an IV tube (catheter). These medicines prevent clots from forming.  Injecting medicine that dissolves blood clots into the affected vein (catheter-directed thrombolysis).  Having surgery. Surgery may be done to: ? Remove the clot. ? Place a filter in a large vein to catch blood clots before they reach the lungs.  Some treatments may be continued for up to six months. Follow these instructions at home: If you are taking an oral blood thinner:  Take the medicine exactly as told by your health care provider. Some blood thinners need to be taken at the same time every day. Do not skip a dose.  Ask your health care provider about what foods and drugs interact with the medicine.  Ask about possible side effects. General instructions  Blood thinners can cause easy bruising and  difficulty stopping bleeding. Because of this, if you are taking or were given a blood thinner: ? Hold pressure over cuts for longer than usual. ? Tell your dentist and other health care providers that you are taking blood thinners before having any procedures that can cause bleeding. ? Avoid contact sports.  Take over-the-counter and prescription medicines only as told by your health care provider.  Return to your normal activities as told by your health care provider. Ask your health care provider what activities are safe for you.  Wear compression stockings if recommended by your health care provider.  Keep all follow-up visits as told by your health care provider. This is important. How is this prevented? To lower your risk of developing this condition again:  For 30 or more minutes every day, do an activity that: ? Involves moving your arms and legs. ? Increases your heart rate.  When traveling for longer than four hours: ? Exercise your arms and legs every hour. ? Drink plenty of water. ? Avoid drinking alcohol.  Avoid sitting or lying for a long time without moving your legs.  Stay a healthy weight.  If you are a woman who is older than age 52, avoid unnecessary use of medicines that contain estrogen.  Do not use any products that contain nicotine or tobacco, such as cigarettes and e-cigarettes. This is especially important if you take estrogen medicines. If you need help quitting, ask your health care provider.  Contact a health care provider if:  You miss a dose of your blood thinner.  You have nausea, vomiting, or diarrhea that lasts for more than one day.  Your menstrual period is heavier than usual.  You have unusual bruising. Get help right away if:  You have new or increased pain, swelling, or redness in an arm or leg.  You have numbness or tingling in an arm or leg.  You have shortness of breath.  You have chest pain.  You have a rapid or irregular  heartbeat.  You feel light-headed or dizzy.  You cough up blood.  There is blood in your vomit, stool, or urine.  You have a serious fall or accident, or you hit your head.  You have a  severe headache or confusion.  You have a cut that will not stop bleeding. These symptoms may represent a serious problem that is an emergency. Do not wait to see if the symptoms will go away. Get medical help right away. Call your local emergency services (911 in the [place] Do not drive yourself to the hospital. Summary  DVT is a condition in which a blood clot forms in a deep vein, such as a lower leg, thigh, or arm vein.  Symptoms can include swelling, warmth, pain, and redness in your leg or arm.  Treatment may include taking blood thinners, injecting medicine that dissolves blood clots,wearing compression stockings, or surgery.  If you are prescribed blood thinners, take them exactly as told. This information is not intended to replace advice given to you by your health care provider. Make sure you discuss any questions you have with your health care provider. Document Released: 09/06/2005 Document Revised: 10/09/2016 Document Reviewed: 10/09/2016 Elsevier Interactive Patient Education  2018 [employer].

## 2017-11-16 NOTE — Progress Notes (Signed)
Pt's IV catheter removed and intact. Pt's IV site clean dry and intact. Report called and given to Juluis Pitch. All questions were answered and no further questions at this time. Pt in stable condition and in no acute distress at time of discharge. Pt will be transported via [facility].

## 2017-11-16 NOTE — NC FL2 (Signed)
[place] LEVEL OF CARE SCREENING TOOL     IDENTIFICATION  Patient Name: Dean Oliver Birthdate: 20-Jan-1946 Sex: male Admission Date (Current Location): 11/15/2017  [facility] and [place] Number:  [employer] and Address:  [facility] [address] [employer]      Provider Number: [id number]  Attending Physician Name and Address:  Murlean Iba, MD  Relative Name and Phone Number:       Current Level of Care: Hospital Recommended Level of Care: [place] Prior Approval Number:    Date Approved/Denied:   PASRR Number:    Discharge Plan: SNF    Current Diagnoses: Patient Active Problem List   Diagnosis Date Noted  . Chest pain 11/15/2017  . Pressure injury of skin 10/06/2017  . Fever in adult   . Gross hematuria   . Urinary tract infection with hematuria   . Quadriplegia ([place]) 10/05/2017  . Sepsis secondary to UTI ([place]) 10/05/2017  . AKI (acute kidney injury) ([place]) 10/05/2017  . HCAP (healthcare-associated pneumonia) 10/05/2017  . Neurogenic bladder 08/18/2017  . Neurogenic bowel 08/18/2017  . Bacterial UTI 08/18/2017  . Central cord syndrome ([place]) 08/17/2017  . Dysphagia   . Fall   . Hypotension   . Sinus bradycardia   . HNP (herniated nucleus pulposus) with myelopathy, cervical   . Benign essential HTN   . Hyponatremia   . Acute blood loss anemia   . Symptomatic bradycardia 08/12/2017  . Schizophrenia ([place])     Orientation RESPIRATION BLADDER Height & Weight     Self, Place  Normal Incontinent Weight: 136 lb 11 oz (62 kg) Height:  5\' 7"  (170.2 cm)  BEHAVIORAL SYMPTOMS/MOOD NEUROLOGICAL BOWEL NUTRITION STATUS      Incontinent Diet(Heart Healthy)  AMBULATORY STATUS COMMUNICATION OF NEEDS Skin   Extensive Assist Verbally Normal                       Personal Care Assistance Level of Assistance  Bathing, Feeding, Dressing Bathing Assistance: Maximum assistance Feeding  assistance: Maximum assistance Dressing Assistance: Maximum assistance     Functional Limitations Info  Sight, Hearing, Speech Sight Info: Adequate Hearing Info: Adequate Speech Info: Adequate    SPECIAL CARE FACTORS FREQUENCY                       Contractures Contractures Info: Not present    Additional Factors Info  Code Status, Allergies, Psychotropic Code Status Info: Full Allergies Info: NKA Psychotropic Info: Xanax         Current Medications (11/16/2017):  This is the current hospital active medication list Current Facility-Administered Medications  Medication Dose Route Frequency Provider Last Rate Last Dose  . 0.9 %  sodium chloride infusion   Intravenous Continuous Barton Dubois, MD      . acetaminophen (TYLENOL) tablet 650 mg  650 mg Oral Q6H PRN Barton Dubois, MD       Or  . acetaminophen (TYLENOL) suppository 650 mg  650 mg Rectal Q6H PRN Barton Dubois, MD      . ALPRAZolam Duanne Moron) tablet 0.25 mg  0.25 mg Oral BID PRN Barton Dubois, MD      . baclofen (LIORESAL) tablet 5 mg  5 mg Oral BID Barton Dubois, MD   5 mg at 11/16/17 2263  . docusate sodium (COLACE) capsule 100 mg  100 mg Oral Daily Barton Dubois, MD   100 mg at 11/16/17 0916  .  ferrous sulfate tablet 325 mg  325 mg Oral BID WC Barton Dubois, MD   325 mg at 11/16/17 0916  . gabapentin (NEURONTIN) capsule 100 mg  100 mg Oral QHS Barton Dubois, MD   100 mg at 11/15/17 2145  . heparin ADULT infusion 100 units/mL (25000 units/261mL sodium chloride 0.45%)  1,000 Units/hr Intravenous Continuous Barton Dubois, MD 10 mL/hr at 11/15/17 2144 1,000 Units/hr at 11/15/17 2144  . HYDROcodone-acetaminophen (NORCO/VICODIN) 5-325 MG per tablet 1 tablet  1 tablet Oral Q6H PRN Barton Dubois, MD   1 tablet at 11/16/17 [phone]  . magnesium sulfate IVPB 2 g 50 mL  2 g Intravenous Once Johnson, Clanford L, MD      . multivitamin with minerals tablet 1 tablet  1 tablet Oral Daily Barton Dubois, MD   1 tablet at  11/16/17 [phone]  . ondansetron (ZOFRAN) tablet 4 mg  4 mg Oral Q6H PRN Barton Dubois, MD       Or  . ondansetron [facility]) injection 4 mg  4 mg Intravenous Q6H PRN Barton Dubois, MD      . pantoprazole (PROTONIX) EC tablet 40 mg  40 mg Oral Daily Barton Dubois, MD   40 mg at 11/16/17 0916  . risperiDONE (RISPERDAL) tablet 0.5 mg  0.5 mg Oral BID Barton Dubois, MD   0.5 mg at 11/16/17 0916  . senna-docusate (Senokot-S) tablet 2 tablet  2 tablet Oral BID Barton Dubois, MD   2 tablet at 11/16/17 [phone]  . sodium phosphate (FLEET) 7-19 GM/118ML enema 1 enema  1 enema Rectal Daily PRN Barton Dubois, MD      . tiZANidine (ZANAFLEX) tablet 4 mg  4 mg Oral Q6H PRN Barton Dubois, MD      . vitamin C (ASCORBIC ACID) tablet 500 mg  500 mg Oral Daily Barton Dubois, MD   500 mg at 11/16/17 0177     Discharge Medications: Please see discharge summary for a list of discharge medications.  Relevant Imaging Results:  Relevant Lab Results:   Additional Information    Sneijder Bernards, Clydene Pugh, LCSW

## 2017-11-16 NOTE — Progress Notes (Signed)
Echocardiogram 2D Echocardiogram has been performed.  Dean Oliver 11/16/2017, 2:26 PM

## 2017-11-17 ENCOUNTER — Other Ambulatory Visit: Payer: Self-pay | Admitting: Neurology

## 2017-11-17 DIAGNOSIS — R1312 Dysphagia, oropharyngeal phase: Secondary | ICD-10-CM | POA: Diagnosis not present

## 2017-11-17 DIAGNOSIS — R1311 Dysphagia, oral phase: Secondary | ICD-10-CM | POA: Diagnosis not present

## 2017-11-17 DIAGNOSIS — R41841 Cognitive communication deficit: Secondary | ICD-10-CM | POA: Diagnosis not present

## 2017-11-17 DIAGNOSIS — S14122D Central cord syndrome at C2 level of cervical spinal cord, subsequent encounter: Secondary | ICD-10-CM | POA: Diagnosis not present

## 2017-11-17 DIAGNOSIS — M6281 Muscle weakness (generalized): Secondary | ICD-10-CM | POA: Diagnosis not present

## 2017-11-17 DIAGNOSIS — M542 Cervicalgia: Secondary | ICD-10-CM

## 2017-11-17 DIAGNOSIS — G8250 Quadriplegia, unspecified: Secondary | ICD-10-CM | POA: Diagnosis not present

## 2017-11-17 DIAGNOSIS — J188 Other pneumonia, unspecified organism: Secondary | ICD-10-CM | POA: Diagnosis not present

## 2017-11-17 LAB — MRSA CULTURE: Culture: NOT DETECTED

## 2017-11-18 ENCOUNTER — Ambulatory Visit (HOSPITAL_COMMUNITY)
Admission: RE | Admit: 2017-11-18 | Discharge: 2017-11-18 | Disposition: A | Discharge status: Home / Self Care | Payer: Medicare Other | Source: Ambulatory Visit | Attending: Neurology | Admitting: Neurology

## 2017-11-18 ENCOUNTER — Other Ambulatory Visit: Payer: Self-pay | Admitting: Neurology

## 2017-11-18 DIAGNOSIS — R41841 Cognitive communication deficit: Secondary | ICD-10-CM | POA: Diagnosis not present

## 2017-11-18 DIAGNOSIS — M542 Cervicalgia: Secondary | ICD-10-CM | POA: Diagnosis not present

## 2017-11-18 DIAGNOSIS — J188 Other pneumonia, unspecified organism: Secondary | ICD-10-CM | POA: Diagnosis not present

## 2017-11-18 DIAGNOSIS — G8250 Quadriplegia, unspecified: Secondary | ICD-10-CM | POA: Diagnosis not present

## 2017-11-18 DIAGNOSIS — R1311 Dysphagia, oral phase: Secondary | ICD-10-CM | POA: Diagnosis not present

## 2017-11-18 DIAGNOSIS — S12100A Unspecified displaced fracture of second cervical vertebra, initial encounter for closed fracture: Secondary | ICD-10-CM | POA: Diagnosis not present

## 2017-11-18 DIAGNOSIS — M6281 Muscle weakness (generalized): Secondary | ICD-10-CM | POA: Diagnosis not present

## 2017-11-18 DIAGNOSIS — R1312 Dysphagia, oropharyngeal phase: Secondary | ICD-10-CM | POA: Diagnosis not present

## 2017-11-18 DIAGNOSIS — S14122D Central cord syndrome at C2 level of cervical spinal cord, subsequent encounter: Secondary | ICD-10-CM | POA: Diagnosis not present

## 2017-11-19 DIAGNOSIS — I10 Essential (primary) hypertension: Secondary | ICD-10-CM | POA: Diagnosis not present

## 2017-11-19 DIAGNOSIS — D649 Anemia, unspecified: Secondary | ICD-10-CM | POA: Diagnosis not present

## 2017-11-21 DIAGNOSIS — G8250 Quadriplegia, unspecified: Secondary | ICD-10-CM | POA: Diagnosis not present

## 2017-11-21 DIAGNOSIS — M6281 Muscle weakness (generalized): Secondary | ICD-10-CM | POA: Diagnosis not present

## 2017-11-21 DIAGNOSIS — R41841 Cognitive communication deficit: Secondary | ICD-10-CM | POA: Diagnosis not present

## 2017-11-21 DIAGNOSIS — J188 Other pneumonia, unspecified organism: Secondary | ICD-10-CM | POA: Diagnosis not present

## 2017-11-21 DIAGNOSIS — R1311 Dysphagia, oral phase: Secondary | ICD-10-CM | POA: Diagnosis not present

## 2017-11-21 DIAGNOSIS — I82409 Acute embolism and thrombosis of unspecified deep veins of unspecified lower extremity: Secondary | ICD-10-CM | POA: Diagnosis not present

## 2017-11-21 DIAGNOSIS — D509 Iron deficiency anemia, unspecified: Secondary | ICD-10-CM | POA: Diagnosis not present

## 2017-11-21 DIAGNOSIS — S14122D Central cord syndrome at C2 level of cervical spinal cord, subsequent encounter: Secondary | ICD-10-CM | POA: Diagnosis not present

## 2017-11-22 DIAGNOSIS — R41841 Cognitive communication deficit: Secondary | ICD-10-CM | POA: Diagnosis not present

## 2017-11-22 DIAGNOSIS — S14122D Central cord syndrome at C2 level of cervical spinal cord, subsequent encounter: Secondary | ICD-10-CM | POA: Diagnosis not present

## 2017-11-22 DIAGNOSIS — M6281 Muscle weakness (generalized): Secondary | ICD-10-CM | POA: Diagnosis not present

## 2017-11-22 DIAGNOSIS — J188 Other pneumonia, unspecified organism: Secondary | ICD-10-CM | POA: Diagnosis not present

## 2017-11-22 DIAGNOSIS — G8250 Quadriplegia, unspecified: Secondary | ICD-10-CM | POA: Diagnosis not present

## 2017-11-22 DIAGNOSIS — R1311 Dysphagia, oral phase: Secondary | ICD-10-CM | POA: Diagnosis not present

## 2017-11-22 DIAGNOSIS — I82409 Acute embolism and thrombosis of unspecified deep veins of unspecified lower extremity: Secondary | ICD-10-CM | POA: Diagnosis not present

## 2017-11-23 DIAGNOSIS — F413 Other mixed anxiety disorders: Secondary | ICD-10-CM | POA: Diagnosis not present

## 2017-11-23 DIAGNOSIS — D649 Anemia, unspecified: Secondary | ICD-10-CM | POA: Diagnosis not present

## 2017-11-23 DIAGNOSIS — R509 Fever, unspecified: Secondary | ICD-10-CM | POA: Diagnosis not present

## 2017-11-23 DIAGNOSIS — G8250 Quadriplegia, unspecified: Secondary | ICD-10-CM | POA: Diagnosis not present

## 2017-11-23 DIAGNOSIS — F333 Major depressive disorder, recurrent, severe with psychotic symptoms: Secondary | ICD-10-CM | POA: Diagnosis not present

## 2017-11-23 DIAGNOSIS — J188 Other pneumonia, unspecified organism: Secondary | ICD-10-CM | POA: Diagnosis not present

## 2017-11-23 DIAGNOSIS — R1311 Dysphagia, oral phase: Secondary | ICD-10-CM | POA: Diagnosis not present

## 2017-11-23 DIAGNOSIS — M6281 Muscle weakness (generalized): Secondary | ICD-10-CM | POA: Diagnosis not present

## 2017-11-23 DIAGNOSIS — R41841 Cognitive communication deficit: Secondary | ICD-10-CM | POA: Diagnosis not present

## 2017-11-23 DIAGNOSIS — S14122D Central cord syndrome at C2 level of cervical spinal cord, subsequent encounter: Secondary | ICD-10-CM | POA: Diagnosis not present

## 2017-11-23 DIAGNOSIS — F919 Conduct disorder, unspecified: Secondary | ICD-10-CM | POA: Diagnosis not present

## 2017-11-23 DIAGNOSIS — F201 Disorganized schizophrenia: Secondary | ICD-10-CM | POA: Diagnosis not present

## 2017-11-24 DIAGNOSIS — R1311 Dysphagia, oral phase: Secondary | ICD-10-CM | POA: Diagnosis not present

## 2017-11-24 DIAGNOSIS — R41841 Cognitive communication deficit: Secondary | ICD-10-CM | POA: Diagnosis not present

## 2017-11-24 DIAGNOSIS — J188 Other pneumonia, unspecified organism: Secondary | ICD-10-CM | POA: Diagnosis not present

## 2017-11-24 DIAGNOSIS — M6281 Muscle weakness (generalized): Secondary | ICD-10-CM | POA: Diagnosis not present

## 2017-11-24 DIAGNOSIS — G8250 Quadriplegia, unspecified: Secondary | ICD-10-CM | POA: Diagnosis not present

## 2017-11-24 DIAGNOSIS — S14122D Central cord syndrome at C2 level of cervical spinal cord, subsequent encounter: Secondary | ICD-10-CM | POA: Diagnosis not present

## 2017-11-25 DIAGNOSIS — F333 Major depressive disorder, recurrent, severe with psychotic symptoms: Secondary | ICD-10-CM | POA: Diagnosis not present

## 2017-11-25 DIAGNOSIS — G8250 Quadriplegia, unspecified: Secondary | ICD-10-CM | POA: Diagnosis not present

## 2017-11-25 DIAGNOSIS — F201 Disorganized schizophrenia: Secondary | ICD-10-CM | POA: Diagnosis not present

## 2017-11-25 DIAGNOSIS — R41841 Cognitive communication deficit: Secondary | ICD-10-CM | POA: Diagnosis not present

## 2017-11-25 DIAGNOSIS — R1311 Dysphagia, oral phase: Secondary | ICD-10-CM | POA: Diagnosis not present

## 2017-11-25 DIAGNOSIS — F413 Other mixed anxiety disorders: Secondary | ICD-10-CM | POA: Diagnosis not present

## 2017-11-25 DIAGNOSIS — M6281 Muscle weakness (generalized): Secondary | ICD-10-CM | POA: Diagnosis not present

## 2017-11-25 DIAGNOSIS — J188 Other pneumonia, unspecified organism: Secondary | ICD-10-CM | POA: Diagnosis not present

## 2017-11-25 DIAGNOSIS — F919 Conduct disorder, unspecified: Secondary | ICD-10-CM | POA: Diagnosis not present

## 2017-11-25 DIAGNOSIS — S14122D Central cord syndrome at C2 level of cervical spinal cord, subsequent encounter: Secondary | ICD-10-CM | POA: Diagnosis not present

## 2017-11-28 DIAGNOSIS — R41841 Cognitive communication deficit: Secondary | ICD-10-CM | POA: Diagnosis not present

## 2017-11-28 DIAGNOSIS — S14122D Central cord syndrome at C2 level of cervical spinal cord, subsequent encounter: Secondary | ICD-10-CM | POA: Diagnosis not present

## 2017-11-28 DIAGNOSIS — M6281 Muscle weakness (generalized): Secondary | ICD-10-CM | POA: Diagnosis not present

## 2017-11-28 DIAGNOSIS — J188 Other pneumonia, unspecified organism: Secondary | ICD-10-CM | POA: Diagnosis not present

## 2017-11-28 DIAGNOSIS — R1311 Dysphagia, oral phase: Secondary | ICD-10-CM | POA: Diagnosis not present

## 2017-11-28 DIAGNOSIS — G8250 Quadriplegia, unspecified: Secondary | ICD-10-CM | POA: Diagnosis not present

## 2017-11-29 DIAGNOSIS — M6281 Muscle weakness (generalized): Secondary | ICD-10-CM | POA: Diagnosis not present

## 2017-11-29 DIAGNOSIS — G8250 Quadriplegia, unspecified: Secondary | ICD-10-CM | POA: Diagnosis not present

## 2017-11-29 DIAGNOSIS — R41841 Cognitive communication deficit: Secondary | ICD-10-CM | POA: Diagnosis not present

## 2017-11-29 DIAGNOSIS — S14122D Central cord syndrome at C2 level of cervical spinal cord, subsequent encounter: Secondary | ICD-10-CM | POA: Diagnosis not present

## 2017-11-29 DIAGNOSIS — R1311 Dysphagia, oral phase: Secondary | ICD-10-CM | POA: Diagnosis not present

## 2017-11-29 DIAGNOSIS — J188 Other pneumonia, unspecified organism: Secondary | ICD-10-CM | POA: Diagnosis not present

## 2017-11-30 DIAGNOSIS — M6281 Muscle weakness (generalized): Secondary | ICD-10-CM | POA: Diagnosis not present

## 2017-11-30 DIAGNOSIS — F413 Other mixed anxiety disorders: Secondary | ICD-10-CM | POA: Diagnosis not present

## 2017-11-30 DIAGNOSIS — R1311 Dysphagia, oral phase: Secondary | ICD-10-CM | POA: Diagnosis not present

## 2017-11-30 DIAGNOSIS — J188 Other pneumonia, unspecified organism: Secondary | ICD-10-CM | POA: Diagnosis not present

## 2017-11-30 DIAGNOSIS — G8250 Quadriplegia, unspecified: Secondary | ICD-10-CM | POA: Diagnosis not present

## 2017-11-30 DIAGNOSIS — R41841 Cognitive communication deficit: Secondary | ICD-10-CM | POA: Diagnosis not present

## 2017-11-30 DIAGNOSIS — S14122D Central cord syndrome at C2 level of cervical spinal cord, subsequent encounter: Secondary | ICD-10-CM | POA: Diagnosis not present

## 2017-11-30 DIAGNOSIS — F919 Conduct disorder, unspecified: Secondary | ICD-10-CM | POA: Diagnosis not present

## 2017-11-30 DIAGNOSIS — F333 Major depressive disorder, recurrent, severe with psychotic symptoms: Secondary | ICD-10-CM | POA: Diagnosis not present

## 2017-11-30 DIAGNOSIS — F201 Disorganized schizophrenia: Secondary | ICD-10-CM | POA: Diagnosis not present

## 2017-12-01 DIAGNOSIS — S14122D Central cord syndrome at C2 level of cervical spinal cord, subsequent encounter: Secondary | ICD-10-CM | POA: Diagnosis not present

## 2017-12-01 DIAGNOSIS — M6281 Muscle weakness (generalized): Secondary | ICD-10-CM | POA: Diagnosis not present

## 2017-12-01 DIAGNOSIS — J188 Other pneumonia, unspecified organism: Secondary | ICD-10-CM | POA: Diagnosis not present

## 2017-12-01 DIAGNOSIS — G8250 Quadriplegia, unspecified: Secondary | ICD-10-CM | POA: Diagnosis not present

## 2017-12-01 DIAGNOSIS — R1311 Dysphagia, oral phase: Secondary | ICD-10-CM | POA: Diagnosis not present

## 2017-12-01 DIAGNOSIS — R41841 Cognitive communication deficit: Secondary | ICD-10-CM | POA: Diagnosis not present

## 2017-12-02 DIAGNOSIS — M6281 Muscle weakness (generalized): Secondary | ICD-10-CM | POA: Diagnosis not present

## 2017-12-02 DIAGNOSIS — S14122D Central cord syndrome at C2 level of cervical spinal cord, subsequent encounter: Secondary | ICD-10-CM | POA: Diagnosis not present

## 2017-12-02 DIAGNOSIS — R41841 Cognitive communication deficit: Secondary | ICD-10-CM | POA: Diagnosis not present

## 2017-12-02 DIAGNOSIS — G8250 Quadriplegia, unspecified: Secondary | ICD-10-CM | POA: Diagnosis not present

## 2017-12-02 DIAGNOSIS — J188 Other pneumonia, unspecified organism: Secondary | ICD-10-CM | POA: Diagnosis not present

## 2017-12-02 DIAGNOSIS — R1311 Dysphagia, oral phase: Secondary | ICD-10-CM | POA: Diagnosis not present

## 2017-12-03 DIAGNOSIS — M6281 Muscle weakness (generalized): Secondary | ICD-10-CM | POA: Diagnosis not present

## 2017-12-03 DIAGNOSIS — G8250 Quadriplegia, unspecified: Secondary | ICD-10-CM | POA: Diagnosis not present

## 2017-12-03 DIAGNOSIS — R1311 Dysphagia, oral phase: Secondary | ICD-10-CM | POA: Diagnosis not present

## 2017-12-03 DIAGNOSIS — R41841 Cognitive communication deficit: Secondary | ICD-10-CM | POA: Diagnosis not present

## 2017-12-03 DIAGNOSIS — J188 Other pneumonia, unspecified organism: Secondary | ICD-10-CM | POA: Diagnosis not present

## 2017-12-03 DIAGNOSIS — S14122D Central cord syndrome at C2 level of cervical spinal cord, subsequent encounter: Secondary | ICD-10-CM | POA: Diagnosis not present

## 2017-12-05 DIAGNOSIS — G8250 Quadriplegia, unspecified: Secondary | ICD-10-CM | POA: Diagnosis not present

## 2017-12-05 DIAGNOSIS — M6281 Muscle weakness (generalized): Secondary | ICD-10-CM | POA: Diagnosis not present

## 2017-12-05 DIAGNOSIS — R41841 Cognitive communication deficit: Secondary | ICD-10-CM | POA: Diagnosis not present

## 2017-12-05 DIAGNOSIS — J188 Other pneumonia, unspecified organism: Secondary | ICD-10-CM | POA: Diagnosis not present

## 2017-12-05 DIAGNOSIS — S14122D Central cord syndrome at C2 level of cervical spinal cord, subsequent encounter: Secondary | ICD-10-CM | POA: Diagnosis not present

## 2017-12-05 DIAGNOSIS — R1311 Dysphagia, oral phase: Secondary | ICD-10-CM | POA: Diagnosis not present

## 2017-12-05 DIAGNOSIS — T839XXA Unspecified complication of genitourinary prosthetic device, implant and graft, initial encounter: Secondary | ICD-10-CM | POA: Diagnosis not present

## 2017-12-06 DIAGNOSIS — M6281 Muscle weakness (generalized): Secondary | ICD-10-CM | POA: Diagnosis not present

## 2017-12-06 DIAGNOSIS — R1311 Dysphagia, oral phase: Secondary | ICD-10-CM | POA: Diagnosis not present

## 2017-12-06 DIAGNOSIS — S14122D Central cord syndrome at C2 level of cervical spinal cord, subsequent encounter: Secondary | ICD-10-CM | POA: Diagnosis not present

## 2017-12-06 DIAGNOSIS — J188 Other pneumonia, unspecified organism: Secondary | ICD-10-CM | POA: Diagnosis not present

## 2017-12-06 DIAGNOSIS — R41841 Cognitive communication deficit: Secondary | ICD-10-CM | POA: Diagnosis not present

## 2017-12-06 DIAGNOSIS — G8250 Quadriplegia, unspecified: Secondary | ICD-10-CM | POA: Diagnosis not present

## 2017-12-07 DIAGNOSIS — S14122D Central cord syndrome at C2 level of cervical spinal cord, subsequent encounter: Secondary | ICD-10-CM | POA: Diagnosis not present

## 2017-12-07 DIAGNOSIS — M6281 Muscle weakness (generalized): Secondary | ICD-10-CM | POA: Diagnosis not present

## 2017-12-07 DIAGNOSIS — R41841 Cognitive communication deficit: Secondary | ICD-10-CM | POA: Diagnosis not present

## 2017-12-07 DIAGNOSIS — J188 Other pneumonia, unspecified organism: Secondary | ICD-10-CM | POA: Diagnosis not present

## 2017-12-07 DIAGNOSIS — G8250 Quadriplegia, unspecified: Secondary | ICD-10-CM | POA: Diagnosis not present

## 2017-12-07 DIAGNOSIS — D649 Anemia, unspecified: Secondary | ICD-10-CM | POA: Diagnosis not present

## 2017-12-07 DIAGNOSIS — R319 Hematuria, unspecified: Secondary | ICD-10-CM | POA: Diagnosis not present

## 2017-12-07 DIAGNOSIS — R1311 Dysphagia, oral phase: Secondary | ICD-10-CM | POA: Diagnosis not present

## 2017-12-08 DIAGNOSIS — R41841 Cognitive communication deficit: Secondary | ICD-10-CM | POA: Diagnosis not present

## 2017-12-08 DIAGNOSIS — S14122D Central cord syndrome at C2 level of cervical spinal cord, subsequent encounter: Secondary | ICD-10-CM | POA: Diagnosis not present

## 2017-12-08 DIAGNOSIS — G8250 Quadriplegia, unspecified: Secondary | ICD-10-CM | POA: Diagnosis not present

## 2017-12-08 DIAGNOSIS — R1311 Dysphagia, oral phase: Secondary | ICD-10-CM | POA: Diagnosis not present

## 2017-12-08 DIAGNOSIS — J188 Other pneumonia, unspecified organism: Secondary | ICD-10-CM | POA: Diagnosis not present

## 2017-12-08 DIAGNOSIS — M6281 Muscle weakness (generalized): Secondary | ICD-10-CM | POA: Diagnosis not present

## 2017-12-09 DIAGNOSIS — R1311 Dysphagia, oral phase: Secondary | ICD-10-CM | POA: Diagnosis not present

## 2017-12-09 DIAGNOSIS — J188 Other pneumonia, unspecified organism: Secondary | ICD-10-CM | POA: Diagnosis not present

## 2017-12-09 DIAGNOSIS — S14122D Central cord syndrome at C2 level of cervical spinal cord, subsequent encounter: Secondary | ICD-10-CM | POA: Diagnosis not present

## 2017-12-09 DIAGNOSIS — M6281 Muscle weakness (generalized): Secondary | ICD-10-CM | POA: Diagnosis not present

## 2017-12-09 DIAGNOSIS — R41841 Cognitive communication deficit: Secondary | ICD-10-CM | POA: Diagnosis not present

## 2017-12-09 DIAGNOSIS — G8250 Quadriplegia, unspecified: Secondary | ICD-10-CM | POA: Diagnosis not present

## 2017-12-12 ENCOUNTER — Emergency Department (HOSPITAL_COMMUNITY): Payer: Medicare Other

## 2017-12-12 ENCOUNTER — Emergency Department (HOSPITAL_COMMUNITY)
Admission: EM | Admit: 2017-12-12 | Discharge: 2017-12-12 | Disposition: A | Discharge status: Home / Self Care | Payer: Medicare Other | Attending: Emergency Medicine | Admitting: Emergency Medicine

## 2017-12-12 ENCOUNTER — Encounter (HOSPITAL_COMMUNITY): Payer: Self-pay | Admitting: Cardiology

## 2017-12-12 DIAGNOSIS — Z7401 Bed confinement status: Secondary | ICD-10-CM | POA: Diagnosis not present

## 2017-12-12 DIAGNOSIS — I10 Essential (primary) hypertension: Secondary | ICD-10-CM | POA: Insufficient documentation

## 2017-12-12 DIAGNOSIS — R279 Unspecified lack of coordination: Secondary | ICD-10-CM | POA: Diagnosis not present

## 2017-12-12 DIAGNOSIS — Z7982 Long term (current) use of aspirin: Secondary | ICD-10-CM | POA: Insufficient documentation

## 2017-12-12 DIAGNOSIS — R1311 Dysphagia, oral phase: Secondary | ICD-10-CM | POA: Diagnosis not present

## 2017-12-12 DIAGNOSIS — M79605 Pain in left leg: Secondary | ICD-10-CM | POA: Diagnosis not present

## 2017-12-12 DIAGNOSIS — S14122D Central cord syndrome at C2 level of cervical spinal cord, subsequent encounter: Secondary | ICD-10-CM | POA: Diagnosis not present

## 2017-12-12 DIAGNOSIS — N390 Urinary tract infection, site not specified: Secondary | ICD-10-CM | POA: Insufficient documentation

## 2017-12-12 DIAGNOSIS — D509 Iron deficiency anemia, unspecified: Secondary | ICD-10-CM | POA: Diagnosis not present

## 2017-12-12 DIAGNOSIS — M6281 Muscle weakness (generalized): Secondary | ICD-10-CM | POA: Diagnosis not present

## 2017-12-12 DIAGNOSIS — D649 Anemia, unspecified: Secondary | ICD-10-CM | POA: Diagnosis not present

## 2017-12-12 DIAGNOSIS — J188 Other pneumonia, unspecified organism: Secondary | ICD-10-CM | POA: Diagnosis not present

## 2017-12-12 DIAGNOSIS — Z79899 Other long term (current) drug therapy: Secondary | ICD-10-CM | POA: Insufficient documentation

## 2017-12-12 DIAGNOSIS — G8250 Quadriplegia, unspecified: Secondary | ICD-10-CM | POA: Diagnosis not present

## 2017-12-12 DIAGNOSIS — R41841 Cognitive communication deficit: Secondary | ICD-10-CM | POA: Diagnosis not present

## 2017-12-12 DIAGNOSIS — T148XXA Other injury of unspecified body region, initial encounter: Secondary | ICD-10-CM | POA: Diagnosis not present

## 2017-12-12 DIAGNOSIS — I517 Cardiomegaly: Secondary | ICD-10-CM | POA: Diagnosis not present

## 2017-12-12 DIAGNOSIS — R42 Dizziness and giddiness: Secondary | ICD-10-CM | POA: Diagnosis not present

## 2017-12-12 DIAGNOSIS — R319 Hematuria, unspecified: Secondary | ICD-10-CM | POA: Diagnosis not present

## 2017-12-12 LAB — CBC WITH DIFFERENTIAL/PLATELET
Basophils Absolute: 0 10*3/uL (ref 0.0–0.1)
Basophils Relative: 0 %
Eosinophils Absolute: 0 10*3/uL (ref 0.0–0.7)
Eosinophils Relative: 0 %
HEMATOCRIT: 30.1 % — AB (ref 39.0–52.0)
Hemoglobin: 8.9 g/dL — ABNORMAL LOW (ref 13.0–17.0)
LYMPHS PCT: 9 %
Lymphs Abs: 0.7 10*3/uL (ref 0.7–4.0)
MCH: 24.7 pg — ABNORMAL LOW (ref 26.0–34.0)
MCHC: 29.6 g/dL — ABNORMAL LOW (ref 30.0–36.0)
MCV: 83.4 fL (ref 78.0–100.0)
Monocytes Absolute: 0.7 10*3/uL (ref 0.1–1.0)
Monocytes Relative: 9 %
NEUTROS ABS: 6.7 10*3/uL (ref 1.7–7.7)
Neutrophils Relative %: 82 %
Platelets: 547 10*3/uL — ABNORMAL HIGH (ref 150–400)
RBC: 3.61 MIL/uL — ABNORMAL LOW (ref 4.22–5.81)
RDW: 14.5 % (ref 11.5–15.5)
WBC: 8.2 10*3/uL (ref 4.0–10.5)

## 2017-12-12 LAB — COMPREHENSIVE METABOLIC PANEL
ALT: 15 U/L — ABNORMAL LOW (ref 17–63)
AST: 18 U/L (ref 15–41)
Albumin: 2.7 g/dL — ABNORMAL LOW (ref 3.5–5.0)
Alkaline Phosphatase: 55 U/L (ref 38–126)
Anion gap: 12 (ref 5–15)
BUN: 16 mg/dL (ref 6–20)
CO2: 27 mmol/L (ref 22–32)
Calcium: 9.2 mg/dL (ref 8.9–10.3)
Chloride: 100 mmol/L — ABNORMAL LOW (ref 101–111)
Creatinine, Ser: 0.59 mg/dL — ABNORMAL LOW (ref 0.61–1.24)
GFR calc Af Amer: >60 mL/min (ref >60)
GFR calc non Af Amer: >60 mL/min (ref >60)
Glucose, Bld: 130 mg/dL — ABNORMAL HIGH (ref 65–99)
Potassium: 3.6 mmol/L (ref 3.5–5.1)
Sodium: 139 mmol/L (ref 135–145)
Total Bilirubin: 0.7 mg/dL (ref 0.3–1.2)
Total Protein: 7.6 g/dL (ref 6.5–8.1)

## 2017-12-12 LAB — URINALYSIS, ROUTINE W REFLEX MICROSCOPIC
Bilirubin Urine: NEGATIVE
Glucose, UA: NEGATIVE mg/dL
Ketones, ur: NEGATIVE mg/dL
Nitrite: NEGATIVE
Protein, ur: 100 mg/dL — AB
Specific Gravity, Urine: 1.02 (ref 1.005–1.030)
pH: 8.5 — ABNORMAL HIGH (ref 5.0–8.0)

## 2017-12-12 LAB — URINALYSIS, MICROSCOPIC (REFLEX)

## 2017-12-12 LAB — I-STAT CG4 LACTIC ACID, ED: Lactic Acid, Venous: 0.92 mmol/L (ref 0.5–1.9)

## 2017-12-12 MED ORDER — CEFTRIAXONE SODIUM 1 G IJ SOLR
1.0000 g | Freq: Once | INTRAMUSCULAR | Status: DC
Start: 2017-12-12 — End: 2017-12-13
  Filled 2017-12-12: qty 10

## 2017-12-12 MED ORDER — ACETAMINOPHEN 325 MG PO TABS
650.0000 mg | ORAL_TABLET | Freq: Once | ORAL | Status: AC
  Administered 2017-12-12: 650 mg via ORAL
  Filled 2017-12-12: qty 2

## 2017-12-12 MED ORDER — CEPHALEXIN 500 MG PO CAPS
500.0000 mg | ORAL_CAPSULE | Freq: Once | ORAL | Status: AC
  Administered 2017-12-12: 500 mg via ORAL
  Filled 2017-12-12: qty 1

## 2017-12-12 MED ORDER — LACTATED RINGERS IV BOLUS
1000.0000 mL | Freq: Once | INTRAVENOUS | Status: AC
  Administered 2017-12-12: 1000 mL via INTRAVENOUS

## 2017-12-12 MED ORDER — CEPHALEXIN 500 MG PO CAPS: 500.0000 mg | ORAL_CAPSULE | Freq: Four times a day (QID) | ORAL | 0 refills | Status: DC

## 2017-12-12 NOTE — ED Provider Notes (Signed)
[facility] EMERGENCY DEPARTMENT Provider Note   CSN: [id number] Arrival date & time: 12/12/17  1250     History   Chief Complaint Chief Complaint  Patient presents with  . Dizziness    HPI NASIAH LEHENBAUER is a 72 y.o. male.  Apparently became diaphoretic, clammy and light headed with sitting up earlier today and a low BP per nursing note. Patient not able to recall any of those things.   The history is provided by the patient and medical records.  Dizziness  Quality:  Lightheadedness Severity:  Mild Timing:  Intermittent Progression:  Resolved Chronicity:  New Context: head movement   Relieved by:  None tried   Past Medical History:  Diagnosis Date  . Cataracts, bilateral   . HTN (hypertension)   . Quadriplegia ([place])   . Schizophrenia [facility])     Patient Active Problem List   Diagnosis Date Noted  . Left leg DVT ([place]) 11/16/2017  . Chest pain 11/15/2017  . Pressure injury of skin 10/06/2017  . Fever in adult   . Urinary tract infection with hematuria   . Quadriplegia ([place]) 10/05/2017  . Sepsis secondary to UTI ([place]) 10/05/2017  . AKI (acute kidney injury) ([place]) 10/05/2017  . HCAP (healthcare-associated pneumonia) 10/05/2017  . Neurogenic bladder 08/18/2017  . Neurogenic bowel 08/18/2017  . Bacterial UTI 08/18/2017  . Central cord syndrome ([place]) 08/17/2017  . Dysphagia   . Fall   . Hypotension   . Sinus bradycardia   . HNP (herniated nucleus pulposus) with myelopathy, cervical   . Benign essential HTN   . Hyponatremia   . Acute blood loss anemia   . Symptomatic bradycardia 08/12/2017  . Schizophrenia [facility])     Past Surgical History:  Procedure Laterality Date  . None          Home Medications    Prior to Admission medications   Medication Sig Start Date End Date Taking? Authorizing Provider  acetaminophen (TYLENOL) 325 MG tablet Take 2 tablets (650 mg total) by mouth every 6 (six) hours as needed for mild pain, moderate pain or fever. 10/10/17    Johnson, Clanford L, MD  ALPRAZolam (XANAX) 0.25 MG tablet Take 1 tablet (0.25 mg total) by mouth 2 (two) times daily as needed for anxiety. 11/16/17   Murlean Iba, MD  apixaban (ELIQUIS) 5 MG TABS tablet Take 2 po BID thru 3/6, then on 3/7 start 1 po BID. 11/16/17   Johnson, Clanford L, MD  aspirin EC 81 MG EC tablet Take 1 tablet (81 mg total) by mouth daily. 08/18/17   Allie Bossier, MD  Baclofen 5 MG TABS Take 5 mg by mouth 2 (two) times daily. 09/07/17   Angiulli, Lavon Paganini, PA-C  docusate sodium (COLACE) 100 MG capsule Take 100 mg by mouth daily.    [provider]  ferrous sulfate 325 (65 FE) MG tablet Take 325 mg by mouth daily with breakfast.    [provider]  gabapentin (NEURONTIN) 100 MG capsule Take 100 mg by mouth at bedtime.    [provider]  HYDROcodone-acetaminophen (NORCO/VICODIN) 5-325 MG tablet Take 1 tablet by mouth every 6 (six) hours as needed for severe pain. 11/16/17   Johnson, Clanford L, MD  Multiple Vitamins-Minerals (CERTAVITE SENIOR/ANTIOXIDANT) TABS Take 1 tablet by mouth daily.    [provider]  risperiDONE (RISPERDAL) 0.5 MG tablet Take 1 tablet (0.5 mg total) by mouth 2 (two) times daily. 09/07/17   Cathlyn Parsons, PA-C  senna-docusate (SENOKOT-S) 8.6-50 MG tablet Take 2 tablets by mouth 2 (two) times daily. 10/10/17   Johnson, Clanford L, MD  sodium phosphate (FLEET) 7-19 GM/118ML ENEM Place 133 mLs (1 enema total) rectally daily as needed for severe constipation. 09/07/17   Angiulli, Lavon Paganini, PA-C  tiZANidine (ZANAFLEX) 4 MG tablet Take 1 tablet (4 mg total) by mouth every 6 (six) hours as needed for muscle spasms. 09/07/17   Angiulli, Lavon Paganini, PA-C  vitamin C (ASCORBIC ACID) 500 MG tablet Take 500 mg by mouth daily.    [provider]    Family History Family History  Family history unknown: Yes    Social History Social History   Tobacco Use  . Smoking status: Never Smoker  . Smokeless  tobacco: Never Used  Substance Use Topics  . Alcohol use: No    Frequency: Never  . Drug use: No     Allergies   Patient has no known allergies.   Review of Systems Review of Systems  Neurological: Positive for dizziness.  All other systems reviewed and are negative.    Physical Exam Updated Vital Signs BP 123/78   Pulse 87   Temp 99.9 F (37.7 C) (Oral)   Resp 16   Ht 5\' 9"  (1.753 m)   Wt 61.7 kg (136 lb)   SpO2 99%   BMI 20.08 kg/m   Physical Exam  Constitutional: He appears well-developed and well-nourished.  HENT:  Head: Normocephalic and atraumatic.  Eyes: Conjunctivae and EOM are normal.  Neck: Normal range of motion.  Cardiovascular: Normal rate.  Pulmonary/Chest: Effort normal. No respiratory distress.  Abdominal: He exhibits no distension.  Musculoskeletal: Normal range of motion.  Contractures of all extremities. Boots in place on feet.   Neurological: He is alert.  Skin: Skin is warm and dry.  Nursing note and vitals reviewed.    ED Treatments / Results  Labs (all labs ordered are listed, but only abnormal results are displayed) Labs Reviewed  CULTURE, BLOOD (ROUTINE X 2)  CULTURE, BLOOD (ROUTINE X 2)  COMPREHENSIVE METABOLIC PANEL  CBC WITH DIFFERENTIAL/PLATELET  URINALYSIS, ROUTINE W REFLEX MICROSCOPIC  I-STAT CG4 LACTIC ACID, ED    EKG None  Radiology No results found.  Procedures Procedures (including critical care time)  Medications Ordered in ED Medications  lactated ringers bolus 1,000 mL (has no administration in time range)     Initial Impression / Assessment and Plan / ED Course  I have reviewed the triage vital signs and the nursing notes.  Pertinent labs & imaging results that were available during my care of the patient were reviewed by me and considered in my medical decision making (see chart for details).      Appears dehydrated. Will fluid resuscitate and eval for sources of low BP.   BP normal here on  multiple readings. Lactic ok. Doubt shock. Pending UA at time of care transfer (to Dr. Lacinda Axon), anticipate UTI. Will need to initiate antibiotics but at this time appears stable for dc.  Final Clinical Impressions(s) / ED Diagnoses   Final diagnoses:  None    ED Discharge Orders    None       Khaleef Ruby, Corene Cornea, MD 12/13/17 1958

## 2017-12-12 NOTE — ED Provider Notes (Signed)
Patient is hemodynamically stable at discharge.  Will start Keflex 500 mg QID for his urinary tract infection.   Nat Christen, MD 12/12/17 2017

## 2017-12-12 NOTE — ED Notes (Signed)
Pt given meal tray.

## 2017-12-12 NOTE — Discharge Instructions (Addendum)
Dean Oliver has a urinary tract infection.  Prescription for antibiotic.  Increase fluids.  Urine culture pending.  Follow-up with your primary care doctor.

## 2017-12-12 NOTE — ED Notes (Signed)
Awaiting transport.

## 2017-12-12 NOTE — ED Triage Notes (Addendum)
Resident at [facility].  Pt is a quadraplegic  Pt sitting up today and became diaphoretic and dizzy.  Staff said his blood pressure was low.   84/50.   cbg 153.  EMS blood pressure was normal.  Pt denies any dizziness at this time.  C/o left leg pain. Was in the hospital last month for sepsis.  Pt has foley with cloudy urine

## 2017-12-13 DIAGNOSIS — J188 Other pneumonia, unspecified organism: Secondary | ICD-10-CM | POA: Diagnosis not present

## 2017-12-13 DIAGNOSIS — S14122D Central cord syndrome at C2 level of cervical spinal cord, subsequent encounter: Secondary | ICD-10-CM | POA: Diagnosis not present

## 2017-12-13 DIAGNOSIS — G8250 Quadriplegia, unspecified: Secondary | ICD-10-CM | POA: Diagnosis not present

## 2017-12-13 DIAGNOSIS — R41841 Cognitive communication deficit: Secondary | ICD-10-CM | POA: Diagnosis not present

## 2017-12-13 DIAGNOSIS — R1311 Dysphagia, oral phase: Secondary | ICD-10-CM | POA: Diagnosis not present

## 2017-12-13 DIAGNOSIS — M6281 Muscle weakness (generalized): Secondary | ICD-10-CM | POA: Diagnosis not present

## 2017-12-13 LAB — BLOOD CULTURE ID PANEL (REFLEXED)
ACINETOBACTER BAUMANNII: NOT DETECTED
CANDIDA ALBICANS: NOT DETECTED
CANDIDA GLABRATA: NOT DETECTED
Candida krusei: NOT DETECTED
Candida parapsilosis: NOT DETECTED
Candida tropicalis: NOT DETECTED
ENTEROBACTERIACEAE SPECIES: NOT DETECTED
ENTEROCOCCUS SPECIES: NOT DETECTED
Enterobacter cloacae complex: NOT DETECTED
Escherichia coli: NOT DETECTED
HAEMOPHILUS INFLUENZAE: NOT DETECTED
Klebsiella oxytoca: NOT DETECTED
Klebsiella pneumoniae: NOT DETECTED
LISTERIA MONOCYTOGENES: NOT DETECTED
METHICILLIN RESISTANCE: DETECTED — AB
NEISSERIA MENINGITIDIS: NOT DETECTED
PSEUDOMONAS AERUGINOSA: NOT DETECTED
Proteus species: NOT DETECTED
STREPTOCOCCUS AGALACTIAE: NOT DETECTED
STREPTOCOCCUS SPECIES: NOT DETECTED
Serratia marcescens: NOT DETECTED
Staphylococcus aureus (BCID): NOT DETECTED
Staphylococcus species: DETECTED — AB
Streptococcus pneumoniae: NOT DETECTED
Streptococcus pyogenes: NOT DETECTED

## 2017-12-14 DIAGNOSIS — I82409 Acute embolism and thrombosis of unspecified deep veins of unspecified lower extremity: Secondary | ICD-10-CM | POA: Diagnosis not present

## 2017-12-14 DIAGNOSIS — R41841 Cognitive communication deficit: Secondary | ICD-10-CM | POA: Diagnosis not present

## 2017-12-14 DIAGNOSIS — S14122D Central cord syndrome at C2 level of cervical spinal cord, subsequent encounter: Secondary | ICD-10-CM | POA: Diagnosis not present

## 2017-12-14 DIAGNOSIS — J188 Other pneumonia, unspecified organism: Secondary | ICD-10-CM | POA: Diagnosis not present

## 2017-12-14 DIAGNOSIS — R1311 Dysphagia, oral phase: Secondary | ICD-10-CM | POA: Diagnosis not present

## 2017-12-14 DIAGNOSIS — D509 Iron deficiency anemia, unspecified: Secondary | ICD-10-CM | POA: Diagnosis not present

## 2017-12-14 DIAGNOSIS — M6281 Muscle weakness (generalized): Secondary | ICD-10-CM | POA: Diagnosis not present

## 2017-12-14 DIAGNOSIS — G8250 Quadriplegia, unspecified: Secondary | ICD-10-CM | POA: Diagnosis not present

## 2017-12-15 DIAGNOSIS — M6281 Muscle weakness (generalized): Secondary | ICD-10-CM | POA: Diagnosis not present

## 2017-12-15 DIAGNOSIS — S14122D Central cord syndrome at C2 level of cervical spinal cord, subsequent encounter: Secondary | ICD-10-CM | POA: Diagnosis not present

## 2017-12-15 DIAGNOSIS — R41841 Cognitive communication deficit: Secondary | ICD-10-CM | POA: Diagnosis not present

## 2017-12-15 DIAGNOSIS — J188 Other pneumonia, unspecified organism: Secondary | ICD-10-CM | POA: Diagnosis not present

## 2017-12-15 DIAGNOSIS — G8250 Quadriplegia, unspecified: Secondary | ICD-10-CM | POA: Diagnosis not present

## 2017-12-15 DIAGNOSIS — R1311 Dysphagia, oral phase: Secondary | ICD-10-CM | POA: Diagnosis not present

## 2017-12-15 LAB — URINE CULTURE: Culture: >=100000 — AB

## 2017-12-15 LAB — CULTURE, BLOOD (ROUTINE X 2): Special Requests: ADEQUATE

## 2017-12-16 ENCOUNTER — Telehealth: Payer: Self-pay

## 2017-12-16 DIAGNOSIS — J188 Other pneumonia, unspecified organism: Secondary | ICD-10-CM | POA: Diagnosis not present

## 2017-12-16 DIAGNOSIS — G8250 Quadriplegia, unspecified: Secondary | ICD-10-CM | POA: Diagnosis not present

## 2017-12-16 DIAGNOSIS — M6281 Muscle weakness (generalized): Secondary | ICD-10-CM | POA: Diagnosis not present

## 2017-12-16 DIAGNOSIS — R41841 Cognitive communication deficit: Secondary | ICD-10-CM | POA: Diagnosis not present

## 2017-12-16 DIAGNOSIS — R1311 Dysphagia, oral phase: Secondary | ICD-10-CM | POA: Diagnosis not present

## 2017-12-16 DIAGNOSIS — S14122D Central cord syndrome at C2 level of cervical spinal cord, subsequent encounter: Secondary | ICD-10-CM | POA: Diagnosis not present

## 2017-12-16 NOTE — Telephone Encounter (Signed)
Spoke with Pt's nurse at [facility]. Staed no problem  BP OK. Instructed to return to ED if problems per Alferd Apa PA

## 2017-12-17 LAB — CULTURE, BLOOD (ROUTINE X 2)
CULTURE: NO GROWTH
SPECIAL REQUESTS: ADEQUATE

## 2017-12-18 DIAGNOSIS — R41841 Cognitive communication deficit: Secondary | ICD-10-CM | POA: Diagnosis not present

## 2017-12-18 DIAGNOSIS — G8250 Quadriplegia, unspecified: Secondary | ICD-10-CM | POA: Diagnosis not present

## 2017-12-18 DIAGNOSIS — J188 Other pneumonia, unspecified organism: Secondary | ICD-10-CM | POA: Diagnosis not present

## 2017-12-18 DIAGNOSIS — R1311 Dysphagia, oral phase: Secondary | ICD-10-CM | POA: Diagnosis not present

## 2017-12-18 DIAGNOSIS — S14122D Central cord syndrome at C2 level of cervical spinal cord, subsequent encounter: Secondary | ICD-10-CM | POA: Diagnosis not present

## 2017-12-18 DIAGNOSIS — M6281 Muscle weakness (generalized): Secondary | ICD-10-CM | POA: Diagnosis not present

## 2017-12-19 DIAGNOSIS — S14122D Central cord syndrome at C2 level of cervical spinal cord, subsequent encounter: Secondary | ICD-10-CM | POA: Diagnosis not present

## 2017-12-19 DIAGNOSIS — G8250 Quadriplegia, unspecified: Secondary | ICD-10-CM | POA: Diagnosis not present

## 2017-12-19 DIAGNOSIS — M6281 Muscle weakness (generalized): Secondary | ICD-10-CM | POA: Diagnosis not present

## 2017-12-20 DIAGNOSIS — G8250 Quadriplegia, unspecified: Secondary | ICD-10-CM | POA: Diagnosis not present

## 2017-12-20 DIAGNOSIS — S14122D Central cord syndrome at C2 level of cervical spinal cord, subsequent encounter: Secondary | ICD-10-CM | POA: Diagnosis not present

## 2017-12-20 DIAGNOSIS — M6281 Muscle weakness (generalized): Secondary | ICD-10-CM | POA: Diagnosis not present

## 2017-12-20 DIAGNOSIS — I82409 Acute embolism and thrombosis of unspecified deep veins of unspecified lower extremity: Secondary | ICD-10-CM | POA: Diagnosis not present

## 2017-12-21 DIAGNOSIS — S14122D Central cord syndrome at C2 level of cervical spinal cord, subsequent encounter: Secondary | ICD-10-CM | POA: Diagnosis not present

## 2017-12-21 DIAGNOSIS — M6281 Muscle weakness (generalized): Secondary | ICD-10-CM | POA: Diagnosis not present

## 2017-12-21 DIAGNOSIS — D649 Anemia, unspecified: Secondary | ICD-10-CM | POA: Diagnosis not present

## 2017-12-21 DIAGNOSIS — Z7901 Long term (current) use of anticoagulants: Secondary | ICD-10-CM | POA: Diagnosis not present

## 2017-12-21 DIAGNOSIS — G8250 Quadriplegia, unspecified: Secondary | ICD-10-CM | POA: Diagnosis not present

## 2017-12-22 DIAGNOSIS — S14122D Central cord syndrome at C2 level of cervical spinal cord, subsequent encounter: Secondary | ICD-10-CM | POA: Diagnosis not present

## 2017-12-22 DIAGNOSIS — M6281 Muscle weakness (generalized): Secondary | ICD-10-CM | POA: Diagnosis not present

## 2017-12-22 DIAGNOSIS — G8250 Quadriplegia, unspecified: Secondary | ICD-10-CM | POA: Diagnosis not present

## 2017-12-23 DIAGNOSIS — M6281 Muscle weakness (generalized): Secondary | ICD-10-CM | POA: Diagnosis not present

## 2017-12-23 DIAGNOSIS — G8250 Quadriplegia, unspecified: Secondary | ICD-10-CM | POA: Diagnosis not present

## 2017-12-23 DIAGNOSIS — S14122D Central cord syndrome at C2 level of cervical spinal cord, subsequent encounter: Secondary | ICD-10-CM | POA: Diagnosis not present

## 2017-12-26 DIAGNOSIS — S14122D Central cord syndrome at C2 level of cervical spinal cord, subsequent encounter: Secondary | ICD-10-CM | POA: Diagnosis not present

## 2017-12-26 DIAGNOSIS — G8250 Quadriplegia, unspecified: Secondary | ICD-10-CM | POA: Diagnosis not present

## 2017-12-26 DIAGNOSIS — M6281 Muscle weakness (generalized): Secondary | ICD-10-CM | POA: Diagnosis not present

## 2017-12-27 DIAGNOSIS — M6281 Muscle weakness (generalized): Secondary | ICD-10-CM | POA: Diagnosis not present

## 2017-12-27 DIAGNOSIS — G8250 Quadriplegia, unspecified: Secondary | ICD-10-CM | POA: Diagnosis not present

## 2017-12-27 DIAGNOSIS — S14122D Central cord syndrome at C2 level of cervical spinal cord, subsequent encounter: Secondary | ICD-10-CM | POA: Diagnosis not present

## 2017-12-28 DIAGNOSIS — S14122D Central cord syndrome at C2 level of cervical spinal cord, subsequent encounter: Secondary | ICD-10-CM | POA: Diagnosis not present

## 2017-12-28 DIAGNOSIS — M6281 Muscle weakness (generalized): Secondary | ICD-10-CM | POA: Diagnosis not present

## 2017-12-28 DIAGNOSIS — F419 Anxiety disorder, unspecified: Secondary | ICD-10-CM | POA: Diagnosis not present

## 2017-12-28 DIAGNOSIS — G8250 Quadriplegia, unspecified: Secondary | ICD-10-CM | POA: Diagnosis not present

## 2017-12-29 DIAGNOSIS — G8250 Quadriplegia, unspecified: Secondary | ICD-10-CM | POA: Diagnosis not present

## 2017-12-29 DIAGNOSIS — S14122D Central cord syndrome at C2 level of cervical spinal cord, subsequent encounter: Secondary | ICD-10-CM | POA: Diagnosis not present

## 2017-12-29 DIAGNOSIS — M6281 Muscle weakness (generalized): Secondary | ICD-10-CM | POA: Diagnosis not present

## 2017-12-30 DIAGNOSIS — G8250 Quadriplegia, unspecified: Secondary | ICD-10-CM | POA: Diagnosis not present

## 2017-12-30 DIAGNOSIS — S14122D Central cord syndrome at C2 level of cervical spinal cord, subsequent encounter: Secondary | ICD-10-CM | POA: Diagnosis not present

## 2017-12-30 DIAGNOSIS — M6281 Muscle weakness (generalized): Secondary | ICD-10-CM | POA: Diagnosis not present

## 2017-12-31 DIAGNOSIS — M6281 Muscle weakness (generalized): Secondary | ICD-10-CM | POA: Diagnosis not present

## 2017-12-31 DIAGNOSIS — S14122D Central cord syndrome at C2 level of cervical spinal cord, subsequent encounter: Secondary | ICD-10-CM | POA: Diagnosis not present

## 2017-12-31 DIAGNOSIS — G8250 Quadriplegia, unspecified: Secondary | ICD-10-CM | POA: Diagnosis not present

## 2018-01-02 DIAGNOSIS — S14122D Central cord syndrome at C2 level of cervical spinal cord, subsequent encounter: Secondary | ICD-10-CM | POA: Diagnosis not present

## 2018-01-02 DIAGNOSIS — M6281 Muscle weakness (generalized): Secondary | ICD-10-CM | POA: Diagnosis not present

## 2018-01-02 DIAGNOSIS — G8250 Quadriplegia, unspecified: Secondary | ICD-10-CM | POA: Diagnosis not present

## 2018-01-03 DIAGNOSIS — G8250 Quadriplegia, unspecified: Secondary | ICD-10-CM | POA: Diagnosis not present

## 2018-01-03 DIAGNOSIS — S14122D Central cord syndrome at C2 level of cervical spinal cord, subsequent encounter: Secondary | ICD-10-CM | POA: Diagnosis not present

## 2018-01-03 DIAGNOSIS — M6281 Muscle weakness (generalized): Secondary | ICD-10-CM | POA: Diagnosis not present

## 2018-01-04 DIAGNOSIS — S14122D Central cord syndrome at C2 level of cervical spinal cord, subsequent encounter: Secondary | ICD-10-CM | POA: Diagnosis not present

## 2018-01-04 DIAGNOSIS — G8250 Quadriplegia, unspecified: Secondary | ICD-10-CM | POA: Diagnosis not present

## 2018-01-04 DIAGNOSIS — M6281 Muscle weakness (generalized): Secondary | ICD-10-CM | POA: Diagnosis not present

## 2018-01-05 DIAGNOSIS — M6281 Muscle weakness (generalized): Secondary | ICD-10-CM | POA: Diagnosis not present

## 2018-01-05 DIAGNOSIS — S14122D Central cord syndrome at C2 level of cervical spinal cord, subsequent encounter: Secondary | ICD-10-CM | POA: Diagnosis not present

## 2018-01-05 DIAGNOSIS — G8250 Quadriplegia, unspecified: Secondary | ICD-10-CM | POA: Diagnosis not present

## 2018-01-06 DIAGNOSIS — G8250 Quadriplegia, unspecified: Secondary | ICD-10-CM | POA: Diagnosis not present

## 2018-01-06 DIAGNOSIS — M6281 Muscle weakness (generalized): Secondary | ICD-10-CM | POA: Diagnosis not present

## 2018-01-06 DIAGNOSIS — S14122D Central cord syndrome at C2 level of cervical spinal cord, subsequent encounter: Secondary | ICD-10-CM | POA: Diagnosis not present

## 2018-01-08 DIAGNOSIS — L89142 Pressure ulcer of left lower back, stage 2: Secondary | ICD-10-CM | POA: Diagnosis not present

## 2018-01-09 DIAGNOSIS — M6281 Muscle weakness (generalized): Secondary | ICD-10-CM | POA: Diagnosis not present

## 2018-01-09 DIAGNOSIS — S14122D Central cord syndrome at C2 level of cervical spinal cord, subsequent encounter: Secondary | ICD-10-CM | POA: Diagnosis not present

## 2018-01-09 DIAGNOSIS — G8250 Quadriplegia, unspecified: Secondary | ICD-10-CM | POA: Diagnosis not present

## 2018-01-10 DIAGNOSIS — S14122D Central cord syndrome at C2 level of cervical spinal cord, subsequent encounter: Secondary | ICD-10-CM | POA: Diagnosis not present

## 2018-01-10 DIAGNOSIS — M6281 Muscle weakness (generalized): Secondary | ICD-10-CM | POA: Diagnosis not present

## 2018-01-10 DIAGNOSIS — G8250 Quadriplegia, unspecified: Secondary | ICD-10-CM | POA: Diagnosis not present

## 2018-01-11 DIAGNOSIS — G8250 Quadriplegia, unspecified: Secondary | ICD-10-CM | POA: Diagnosis not present

## 2018-01-11 DIAGNOSIS — S14122D Central cord syndrome at C2 level of cervical spinal cord, subsequent encounter: Secondary | ICD-10-CM | POA: Diagnosis not present

## 2018-01-11 DIAGNOSIS — M6281 Muscle weakness (generalized): Secondary | ICD-10-CM | POA: Diagnosis not present

## 2018-01-12 DIAGNOSIS — S14122D Central cord syndrome at C2 level of cervical spinal cord, subsequent encounter: Secondary | ICD-10-CM | POA: Diagnosis not present

## 2018-01-12 DIAGNOSIS — G8250 Quadriplegia, unspecified: Secondary | ICD-10-CM | POA: Diagnosis not present

## 2018-01-12 DIAGNOSIS — M6281 Muscle weakness (generalized): Secondary | ICD-10-CM | POA: Diagnosis not present

## 2018-01-13 DIAGNOSIS — M6281 Muscle weakness (generalized): Secondary | ICD-10-CM | POA: Diagnosis not present

## 2018-01-13 DIAGNOSIS — S14122D Central cord syndrome at C2 level of cervical spinal cord, subsequent encounter: Secondary | ICD-10-CM | POA: Diagnosis not present

## 2018-01-13 DIAGNOSIS — G8250 Quadriplegia, unspecified: Secondary | ICD-10-CM | POA: Diagnosis not present

## 2018-01-16 DIAGNOSIS — L89140 Pressure ulcer of left lower back, unstageable: Secondary | ICD-10-CM | POA: Diagnosis not present

## 2018-01-16 DIAGNOSIS — S14122D Central cord syndrome at C2 level of cervical spinal cord, subsequent encounter: Secondary | ICD-10-CM | POA: Diagnosis not present

## 2018-01-16 DIAGNOSIS — G8250 Quadriplegia, unspecified: Secondary | ICD-10-CM | POA: Diagnosis not present

## 2018-01-16 DIAGNOSIS — M6281 Muscle weakness (generalized): Secondary | ICD-10-CM | POA: Diagnosis not present

## 2018-01-16 DIAGNOSIS — L89210 Pressure ulcer of right hip, unstageable: Secondary | ICD-10-CM | POA: Diagnosis not present

## 2018-01-17 DIAGNOSIS — G8250 Quadriplegia, unspecified: Secondary | ICD-10-CM | POA: Diagnosis not present

## 2018-01-17 DIAGNOSIS — M542 Cervicalgia: Secondary | ICD-10-CM | POA: Diagnosis not present

## 2018-01-17 DIAGNOSIS — M792 Neuralgia and neuritis, unspecified: Secondary | ICD-10-CM | POA: Diagnosis not present

## 2018-01-17 DIAGNOSIS — F209 Schizophrenia, unspecified: Secondary | ICD-10-CM | POA: Diagnosis not present

## 2018-01-17 DIAGNOSIS — I8290 Acute embolism and thrombosis of unspecified vein: Secondary | ICD-10-CM | POA: Diagnosis not present

## 2018-01-17 DIAGNOSIS — M6281 Muscle weakness (generalized): Secondary | ICD-10-CM | POA: Diagnosis not present

## 2018-01-17 DIAGNOSIS — S14122D Central cord syndrome at C2 level of cervical spinal cord, subsequent encounter: Secondary | ICD-10-CM | POA: Diagnosis not present

## 2018-01-18 DIAGNOSIS — R6889 Other general symptoms and signs: Secondary | ICD-10-CM | POA: Diagnosis not present

## 2018-01-20 DIAGNOSIS — M6281 Muscle weakness (generalized): Secondary | ICD-10-CM | POA: Diagnosis not present

## 2018-01-20 DIAGNOSIS — G8250 Quadriplegia, unspecified: Secondary | ICD-10-CM | POA: Diagnosis not present

## 2018-01-20 DIAGNOSIS — S14122D Central cord syndrome at C2 level of cervical spinal cord, subsequent encounter: Secondary | ICD-10-CM | POA: Diagnosis not present

## 2018-01-23 DIAGNOSIS — L89210 Pressure ulcer of right hip, unstageable: Secondary | ICD-10-CM | POA: Diagnosis not present

## 2018-01-23 DIAGNOSIS — G8250 Quadriplegia, unspecified: Secondary | ICD-10-CM | POA: Diagnosis not present

## 2018-01-23 DIAGNOSIS — L89130 Pressure ulcer of right lower back, unstageable: Secondary | ICD-10-CM | POA: Diagnosis not present

## 2018-01-23 DIAGNOSIS — S14122D Central cord syndrome at C2 level of cervical spinal cord, subsequent encounter: Secondary | ICD-10-CM | POA: Diagnosis not present

## 2018-01-23 DIAGNOSIS — M6281 Muscle weakness (generalized): Secondary | ICD-10-CM | POA: Diagnosis not present

## 2018-01-24 DIAGNOSIS — M6281 Muscle weakness (generalized): Secondary | ICD-10-CM | POA: Diagnosis not present

## 2018-01-24 DIAGNOSIS — S14122D Central cord syndrome at C2 level of cervical spinal cord, subsequent encounter: Secondary | ICD-10-CM | POA: Diagnosis not present

## 2018-01-24 DIAGNOSIS — G8250 Quadriplegia, unspecified: Secondary | ICD-10-CM | POA: Diagnosis not present

## 2018-01-25 DIAGNOSIS — M6281 Muscle weakness (generalized): Secondary | ICD-10-CM | POA: Diagnosis not present

## 2018-01-25 DIAGNOSIS — G8250 Quadriplegia, unspecified: Secondary | ICD-10-CM | POA: Diagnosis not present

## 2018-01-25 DIAGNOSIS — S14122D Central cord syndrome at C2 level of cervical spinal cord, subsequent encounter: Secondary | ICD-10-CM | POA: Diagnosis not present

## 2018-01-26 DIAGNOSIS — S14122D Central cord syndrome at C2 level of cervical spinal cord, subsequent encounter: Secondary | ICD-10-CM | POA: Diagnosis not present

## 2018-01-26 DIAGNOSIS — G8250 Quadriplegia, unspecified: Secondary | ICD-10-CM | POA: Diagnosis not present

## 2018-01-26 DIAGNOSIS — M6281 Muscle weakness (generalized): Secondary | ICD-10-CM | POA: Diagnosis not present

## 2018-01-27 DIAGNOSIS — S14122D Central cord syndrome at C2 level of cervical spinal cord, subsequent encounter: Secondary | ICD-10-CM | POA: Diagnosis not present

## 2018-01-27 DIAGNOSIS — M6281 Muscle weakness (generalized): Secondary | ICD-10-CM | POA: Diagnosis not present

## 2018-01-27 DIAGNOSIS — G8250 Quadriplegia, unspecified: Secondary | ICD-10-CM | POA: Diagnosis not present

## 2018-01-30 DIAGNOSIS — M6281 Muscle weakness (generalized): Secondary | ICD-10-CM | POA: Diagnosis not present

## 2018-01-30 DIAGNOSIS — G8250 Quadriplegia, unspecified: Secondary | ICD-10-CM | POA: Diagnosis not present

## 2018-01-30 DIAGNOSIS — S14122D Central cord syndrome at C2 level of cervical spinal cord, subsequent encounter: Secondary | ICD-10-CM | POA: Diagnosis not present

## 2018-01-30 DIAGNOSIS — L89130 Pressure ulcer of right lower back, unstageable: Secondary | ICD-10-CM | POA: Diagnosis not present

## 2018-01-30 DIAGNOSIS — L89210 Pressure ulcer of right hip, unstageable: Secondary | ICD-10-CM | POA: Diagnosis not present

## 2018-01-31 DIAGNOSIS — I82409 Acute embolism and thrombosis of unspecified deep veins of unspecified lower extremity: Secondary | ICD-10-CM | POA: Diagnosis not present

## 2018-01-31 DIAGNOSIS — M6281 Muscle weakness (generalized): Secondary | ICD-10-CM | POA: Diagnosis not present

## 2018-01-31 DIAGNOSIS — S14122D Central cord syndrome at C2 level of cervical spinal cord, subsequent encounter: Secondary | ICD-10-CM | POA: Diagnosis not present

## 2018-01-31 DIAGNOSIS — G8250 Quadriplegia, unspecified: Secondary | ICD-10-CM | POA: Diagnosis not present

## 2018-01-31 DIAGNOSIS — M792 Neuralgia and neuritis, unspecified: Secondary | ICD-10-CM | POA: Diagnosis not present

## 2018-01-31 DIAGNOSIS — F209 Schizophrenia, unspecified: Secondary | ICD-10-CM | POA: Diagnosis not present

## 2018-02-01 DIAGNOSIS — S14122D Central cord syndrome at C2 level of cervical spinal cord, subsequent encounter: Secondary | ICD-10-CM | POA: Diagnosis not present

## 2018-02-01 DIAGNOSIS — G8250 Quadriplegia, unspecified: Secondary | ICD-10-CM | POA: Diagnosis not present

## 2018-02-01 DIAGNOSIS — M6281 Muscle weakness (generalized): Secondary | ICD-10-CM | POA: Diagnosis not present

## 2018-02-02 DIAGNOSIS — S14122D Central cord syndrome at C2 level of cervical spinal cord, subsequent encounter: Secondary | ICD-10-CM | POA: Diagnosis not present

## 2018-02-02 DIAGNOSIS — M6281 Muscle weakness (generalized): Secondary | ICD-10-CM | POA: Diagnosis not present

## 2018-02-02 DIAGNOSIS — G8250 Quadriplegia, unspecified: Secondary | ICD-10-CM | POA: Diagnosis not present

## 2018-02-06 DIAGNOSIS — G8250 Quadriplegia, unspecified: Secondary | ICD-10-CM | POA: Diagnosis not present

## 2018-02-06 DIAGNOSIS — L89130 Pressure ulcer of right lower back, unstageable: Secondary | ICD-10-CM | POA: Diagnosis not present

## 2018-02-06 DIAGNOSIS — S14122D Central cord syndrome at C2 level of cervical spinal cord, subsequent encounter: Secondary | ICD-10-CM | POA: Diagnosis not present

## 2018-02-06 DIAGNOSIS — L89214 Pressure ulcer of right hip, stage 4: Secondary | ICD-10-CM | POA: Diagnosis not present

## 2018-02-06 DIAGNOSIS — M6281 Muscle weakness (generalized): Secondary | ICD-10-CM | POA: Diagnosis not present

## 2018-02-07 DIAGNOSIS — G8250 Quadriplegia, unspecified: Secondary | ICD-10-CM | POA: Diagnosis not present

## 2018-02-07 DIAGNOSIS — S14122D Central cord syndrome at C2 level of cervical spinal cord, subsequent encounter: Secondary | ICD-10-CM | POA: Diagnosis not present

## 2018-02-07 DIAGNOSIS — M6281 Muscle weakness (generalized): Secondary | ICD-10-CM | POA: Diagnosis not present

## 2018-02-08 ENCOUNTER — Encounter (HOSPITAL_COMMUNITY): Payer: Self-pay | Admitting: Emergency Medicine

## 2018-02-08 ENCOUNTER — Other Ambulatory Visit: Payer: Self-pay

## 2018-02-08 ENCOUNTER — Emergency Department (HOSPITAL_COMMUNITY)
Admission: EM | Admit: 2018-02-08 | Discharge: 2018-02-08 | Disposition: A | Discharge status: Home / Self Care | Payer: Medicare Other | Attending: Emergency Medicine | Admitting: Emergency Medicine

## 2018-02-08 DIAGNOSIS — I10 Essential (primary) hypertension: Secondary | ICD-10-CM | POA: Insufficient documentation

## 2018-02-08 DIAGNOSIS — L89323 Pressure ulcer of left buttock, stage 3: Secondary | ICD-10-CM | POA: Insufficient documentation

## 2018-02-08 DIAGNOSIS — G8250 Quadriplegia, unspecified: Secondary | ICD-10-CM | POA: Diagnosis not present

## 2018-02-08 DIAGNOSIS — R339 Retention of urine, unspecified: Secondary | ICD-10-CM | POA: Diagnosis present

## 2018-02-08 DIAGNOSIS — Z7901 Long term (current) use of anticoagulants: Secondary | ICD-10-CM | POA: Diagnosis not present

## 2018-02-08 DIAGNOSIS — Z79899 Other long term (current) drug therapy: Secondary | ICD-10-CM | POA: Insufficient documentation

## 2018-02-08 DIAGNOSIS — R531 Weakness: Secondary | ICD-10-CM | POA: Diagnosis not present

## 2018-02-08 DIAGNOSIS — T83511A Infection and inflammatory reaction due to indwelling urethral catheter, initial encounter: Secondary | ICD-10-CM

## 2018-02-08 DIAGNOSIS — R402411 Glasgow coma scale score 13-15, in the field [EMT or ambulance]: Secondary | ICD-10-CM | POA: Diagnosis not present

## 2018-02-08 DIAGNOSIS — N390 Urinary tract infection, site not specified: Secondary | ICD-10-CM

## 2018-02-08 LAB — URINALYSIS, ROUTINE W REFLEX MICROSCOPIC
Bilirubin Urine: NEGATIVE
GLUCOSE, UA: NEGATIVE mg/dL
Ketones, ur: NEGATIVE mg/dL
Nitrite: NEGATIVE
Protein, ur: 30 mg/dL — AB
SPECIFIC GRAVITY, URINE: 1.009 (ref 1.005–1.030)
WBC, UA: >50 WBC/hpf — ABNORMAL HIGH (ref 0–5)
pH: 7 (ref 5.0–8.0)

## 2018-02-08 MED ORDER — CEPHALEXIN 500 MG PO CAPS
500.0000 mg | ORAL_CAPSULE | Freq: Once | ORAL | Status: AC
  Administered 2018-02-08: 500 mg via ORAL
  Filled 2018-02-08: qty 1

## 2018-02-08 MED ORDER — CEPHALEXIN 500 MG PO CAPS: 500.0000 mg | ORAL_CAPSULE | Freq: Three times a day (TID) | ORAL | 0 refills | Status: AC

## 2018-02-08 NOTE — Discharge Instructions (Signed)
Urine sample shows infection The urinary catheter needs to be changed once a month Please pay particular attention to the appearance of the urine and if it appears cloudy, clumpy or if there is decreased output please evaluate catheter position, flush if needed to keep the urine flowing Keflex 3 times daily for 7 days Culture is pending - should be followed up in 48 hours. Please keep Dean Oliver turned every 2 hours to prevent skin breakdown Needs family doctor evaluation this week ER for vomiting, changes in mental status or fevers.

## 2018-02-08 NOTE — ED Provider Notes (Addendum)
[facility] EMERGENCY DEPARTMENT Provider Note   CSN: [id number] Arrival date & time: 02/08/18  1129     History   Chief Complaint Chief Complaint  Patient presents with  . Urinary Retention    HPI Dean Oliver is a 72 y.o. male.  HPI  The patient is a 72 year old male, he has a known history of quadriplegia, schizophrenia, he is currently at [facility], he is accompanied by his cousin who is his primary caregiver outside of the nursing facility and who is also the primary historian.  The patient has had multiple difficulties with his Foley catheter becoming obstructed, infected and according to what she states not changed often enough.  She reports that today when she was taking him to the neurology office he was complaining about the need to urinate but the inability to do so.  She looked at his urinary catheter and it was full of cloudy dark clumpy urine.  The patient denies any other complaints other than the feeling of pressure in the need to void but the inability to do so.  No fevers, no vomiting.  No medications given prior to arrival.  Nothing makes this better or worse.  According to the family member this usually gets resolved on the patient is able to have a Foley catheter change.  Past Medical History:  Diagnosis Date  . Cataracts, bilateral   . HTN (hypertension)   . Quadriplegia ([place])   . Schizophrenia [facility])     Patient Active Problem List   Diagnosis Date Noted  . Left leg DVT ([place]) 11/16/2017  . Chest pain 11/15/2017  . Pressure injury of skin 10/06/2017  . Fever in adult   . Urinary tract infection with hematuria   . Quadriplegia ([place]) 10/05/2017  . Sepsis secondary to UTI ([place]) 10/05/2017  . AKI (acute kidney injury) ([place]) 10/05/2017  . HCAP (healthcare-associated pneumonia) 10/05/2017  . Neurogenic bladder 08/18/2017  . Neurogenic bowel 08/18/2017  . Bacterial UTI 08/18/2017  . Central cord syndrome ([place]) 08/17/2017  . Dysphagia   . Fall    . Hypotension   . Sinus bradycardia   . HNP (herniated nucleus pulposus) with myelopathy, cervical   . Benign essential HTN   . Hyponatremia   . Acute blood loss anemia   . Symptomatic bradycardia 08/12/2017  . Schizophrenia [facility])     Past Surgical History:  Procedure Laterality Date  . None          Home Medications    Prior to Admission medications   Medication Sig Start Date End Date Taking? Authorizing Provider  acetaminophen (TYLENOL) 325 MG tablet Take 2 tablets (650 mg total) by mouth every 6 (six) hours as needed for mild pain, moderate pain or fever. 10/10/17  Yes Johnson, Clanford L, MD  ALPRAZolam (XANAX) 0.25 MG tablet Take 1 tablet (0.25 mg total) by mouth 2 (two) times daily as needed for anxiety. 11/16/17  Yes Johnson, Clanford L, MD  apixaban (ELIQUIS) 5 MG TABS tablet Take 2 po BID thru 3/6, then on 3/7 start 1 po BID. Patient taking differently: Take 5 mg by mouth daily.  11/16/17  Yes Johnson, Clanford L, MD  docusate sodium (COLACE) 100 MG capsule Take 100 mg by mouth daily.   Yes [provider]  ferrous sulfate 325 (65 FE) MG tablet Take 325 mg by mouth 2 (two) times daily with a meal.    Yes [provider]  gabapentin (NEURONTIN) 100 MG capsule Take 100 mg  by mouth at bedtime.   Yes [provider]  HYDROcodone-acetaminophen (NORCO/VICODIN) 5-325 MG tablet Take 1 tablet by mouth every 6 (six) hours as needed for severe pain. 11/16/17  Yes Johnson, Clanford L, MD  Multiple Vitamins-Minerals (CERTAVITE SENIOR/ANTIOXIDANT) TABS Take 1 tablet by mouth daily.   Yes [provider]  risperiDONE (RISPERDAL) 0.5 MG tablet Take 1 tablet (0.5 mg total) by mouth 2 (two) times daily. 09/07/17  Yes Angiulli, Lavon Paganini, PA-C  senna-docusate (SENOKOT-S) 8.6-50 MG tablet Take 2 tablets by mouth 2 (two) times daily. 10/10/17  Yes Johnson, Clanford L, MD  sodium phosphate (FLEET) 7-19 GM/118ML ENEM Place 133 mLs (1 enema total) rectally daily as  needed for severe constipation. 09/07/17  Yes Angiulli, Lavon Paganini, PA-C  tiZANidine (ZANAFLEX) 4 MG tablet Take 1 tablet (4 mg total) by mouth every 6 (six) hours as needed for muscle spasms. 09/07/17  Yes Angiulli, Lavon Paganini, PA-C  vitamin C (ASCORBIC ACID) 500 MG tablet Take 500 mg by mouth 2 (two) times daily.    Yes [provider]  Baclofen 5 MG TABS Take 5 mg by mouth 2 (two) times daily. 09/07/17   Angiulli, Lavon Paganini, PA-C  cephALEXin (KEFLEX) 500 MG capsule Take 1 capsule (500 mg total) by mouth 3 (three) times daily for 7 days. 02/08/18 02/15/18  Noemi Chapel, MD    Family History Family History  Family history unknown: Yes    Social History Social History   Tobacco Use  . Smoking status: Never Smoker  . Smokeless tobacco: Never Used  Substance Use Topics  . Alcohol use: No    Frequency: Never  . Drug use: No     Allergies   Patient has no known allergies.   Review of Systems Review of Systems  All other systems reviewed and are negative.    Physical Exam Updated Vital Signs Pulse (P) 75   Temp (P) 99 F (37.2 C) (Oral)   Resp (P) 18   SpO2 (P) 100%   Physical Exam  Constitutional: He appears well-developed and well-nourished. No distress.  HENT:  Head: Normocephalic and atraumatic.  Mouth/Throat: Oropharynx is clear and moist. No oropharyngeal exudate.  Eyes: Pupils are equal, round, and reactive to light. Conjunctivae and EOM are normal. Right eye exhibits no discharge. Left eye exhibits no discharge. No scleral icterus.  Neck: Normal range of motion. Neck supple. No JVD present. No thyromegaly present.  Cardiovascular: Normal rate, regular rhythm, normal heart sounds and intact distal pulses. Exam reveals no gallop and no friction rub.  No murmur heard. Pulmonary/Chest: Effort normal and breath sounds normal. No respiratory distress. He has no wheezes. He has no rales.  Abdominal: Soft. Bowel sounds are normal. He exhibits no distension and no  mass. There is no tenderness.  Genitourinary:  Genitourinary Comments: Foley catheter in the penis Foley catheter with significant amount of sediment, brownish-yellow cloudy urine  Musculoskeletal: Normal range of motion. He exhibits no edema or tenderness.  Lymphadenopathy:    He has no cervical adenopathy.  Neurological: He is alert.  Quadriplegia, some movement of the upper extremities but minimal  Skin: Skin is warm and dry. No rash noted. No erythema.  Decubitus ulcer to the L buttock - wound cleaned and dreessing changed on exam  Psychiatric: He has a normal mood and affect. His behavior is normal.  Nursing note and vitals reviewed.    ED Treatments / Results  Labs (all labs ordered are listed, but only abnormal results are  displayed) Labs Reviewed  URINALYSIS, ROUTINE W REFLEX MICROSCOPIC - Abnormal; Notable for the following components:      Result Value   APPearance CLOUDY (*)    Hgb urine dipstick MODERATE (*)    Protein, ur 30 (*)    Leukocytes, UA LARGE (*)    WBC, UA >50 (*)    Bacteria, UA RARE (*)    All other components within normal limits  URINE CULTURE    EKG None  Radiology No results found.  Procedures Procedures (including critical care time)  Medications Ordered in ED Medications  cephALEXin (KEFLEX) capsule 500 mg (has no administration in time range)     Initial Impression / Assessment and Plan / ED Course  I have reviewed the triage vital signs and the nursing notes.  Pertinent labs & imaging results that were available during my care of the patient were reviewed by me and considered in my medical decision making (see chart for details).  Clinical Course as of Feb 08 1400  Wed Feb 08, 2018  1345 Foley replaced, UA pending - VS normal otherwise exept for an isolated temperature of 100.9 which was not measured on f/u.  Likely  has UTI - plan on abx.  U Cx sent   [BM]    Clinical Course User Index [BM] Noemi Chapel, MD    Though the  patient is in no distress he does have appear to have urinary obstruction, the Foley catheter does not appear to be functional.  Will replace with a clean Foley, send a urine sample, may need infectious treatment.  Otherwise the patient appears well and has no abdominal discomfort  Gust results with patient, family member, specific instructions given to nursing facility.  Patient stable for discharge on Keflex  Final Clinical Impressions(s) / ED Diagnoses   Final diagnoses:  Urinary tract infection associated with indwelling urethral catheter, initial encounter [facility])    ED Discharge Orders        Ordered    cephALEXin (KEFLEX) 500 MG capsule  3 times daily     02/08/18 1358       Noemi Chapel, MD 02/08/18 1401    Noemi Chapel, MD 02/08/18 1407

## 2018-02-08 NOTE — ED Triage Notes (Signed)
Pt was at a doctors office today and was sent over to ER for unable to void.  Catheter in place at this time.  Limited urine output.

## 2018-02-09 DIAGNOSIS — M6281 Muscle weakness (generalized): Secondary | ICD-10-CM | POA: Diagnosis not present

## 2018-02-09 DIAGNOSIS — G8250 Quadriplegia, unspecified: Secondary | ICD-10-CM | POA: Diagnosis not present

## 2018-02-09 DIAGNOSIS — S14122D Central cord syndrome at C2 level of cervical spinal cord, subsequent encounter: Secondary | ICD-10-CM | POA: Diagnosis not present

## 2018-02-10 DIAGNOSIS — M6281 Muscle weakness (generalized): Secondary | ICD-10-CM | POA: Diagnosis not present

## 2018-02-10 DIAGNOSIS — S14122D Central cord syndrome at C2 level of cervical spinal cord, subsequent encounter: Secondary | ICD-10-CM | POA: Diagnosis not present

## 2018-02-10 DIAGNOSIS — G8250 Quadriplegia, unspecified: Secondary | ICD-10-CM | POA: Diagnosis not present

## 2018-02-11 LAB — URINE CULTURE: Culture: >=100000 — AB

## 2018-02-12 ENCOUNTER — Telehealth: Payer: Self-pay

## 2018-02-12 NOTE — Telephone Encounter (Signed)
UC report with recommedation for new abx treatment faxed to [facility] [place] [phone]  Needs to stop Keflex and start Amoxicillin per Joline Maxcy PAC

## 2018-02-13 DIAGNOSIS — G8250 Quadriplegia, unspecified: Secondary | ICD-10-CM | POA: Diagnosis not present

## 2018-02-13 DIAGNOSIS — S14122D Central cord syndrome at C2 level of cervical spinal cord, subsequent encounter: Secondary | ICD-10-CM | POA: Diagnosis not present

## 2018-02-13 DIAGNOSIS — M6281 Muscle weakness (generalized): Secondary | ICD-10-CM | POA: Diagnosis not present

## 2018-02-15 DIAGNOSIS — L89143 Pressure ulcer of left lower back, stage 3: Secondary | ICD-10-CM | POA: Diagnosis not present

## 2018-02-15 DIAGNOSIS — L89214 Pressure ulcer of right hip, stage 4: Secondary | ICD-10-CM | POA: Diagnosis not present

## 2018-02-16 ENCOUNTER — Other Ambulatory Visit (HOSPITAL_COMMUNITY): Payer: Self-pay | Admitting: Internal Medicine

## 2018-02-16 DIAGNOSIS — G8250 Quadriplegia, unspecified: Secondary | ICD-10-CM | POA: Diagnosis not present

## 2018-02-16 DIAGNOSIS — S14122D Central cord syndrome at C2 level of cervical spinal cord, subsequent encounter: Secondary | ICD-10-CM | POA: Diagnosis not present

## 2018-02-16 DIAGNOSIS — M6281 Muscle weakness (generalized): Secondary | ICD-10-CM | POA: Diagnosis not present

## 2018-02-17 DIAGNOSIS — G8250 Quadriplegia, unspecified: Secondary | ICD-10-CM | POA: Diagnosis not present

## 2018-02-17 DIAGNOSIS — F413 Other mixed anxiety disorders: Secondary | ICD-10-CM | POA: Diagnosis not present

## 2018-02-17 DIAGNOSIS — F201 Disorganized schizophrenia: Secondary | ICD-10-CM | POA: Diagnosis not present

## 2018-02-17 DIAGNOSIS — S14122D Central cord syndrome at C2 level of cervical spinal cord, subsequent encounter: Secondary | ICD-10-CM | POA: Diagnosis not present

## 2018-02-17 DIAGNOSIS — M6281 Muscle weakness (generalized): Secondary | ICD-10-CM | POA: Diagnosis not present

## 2018-02-17 DIAGNOSIS — F333 Major depressive disorder, recurrent, severe with psychotic symptoms: Secondary | ICD-10-CM | POA: Diagnosis not present

## 2018-02-17 DIAGNOSIS — F919 Conduct disorder, unspecified: Secondary | ICD-10-CM | POA: Diagnosis not present

## 2018-02-20 DIAGNOSIS — L89143 Pressure ulcer of left lower back, stage 3: Secondary | ICD-10-CM | POA: Diagnosis not present

## 2018-02-20 DIAGNOSIS — S14122D Central cord syndrome at C2 level of cervical spinal cord, subsequent encounter: Secondary | ICD-10-CM | POA: Diagnosis not present

## 2018-02-20 DIAGNOSIS — L97829 Non-pressure chronic ulcer of other part of left lower leg with unspecified severity: Secondary | ICD-10-CM | POA: Diagnosis not present

## 2018-02-20 DIAGNOSIS — L89214 Pressure ulcer of right hip, stage 4: Secondary | ICD-10-CM | POA: Diagnosis not present

## 2018-02-20 DIAGNOSIS — G8250 Quadriplegia, unspecified: Secondary | ICD-10-CM | POA: Diagnosis not present

## 2018-02-20 DIAGNOSIS — M6281 Muscle weakness (generalized): Secondary | ICD-10-CM | POA: Diagnosis not present

## 2018-02-21 DIAGNOSIS — S14122D Central cord syndrome at C2 level of cervical spinal cord, subsequent encounter: Secondary | ICD-10-CM | POA: Diagnosis not present

## 2018-02-21 DIAGNOSIS — G8250 Quadriplegia, unspecified: Secondary | ICD-10-CM | POA: Diagnosis not present

## 2018-02-21 DIAGNOSIS — M6281 Muscle weakness (generalized): Secondary | ICD-10-CM | POA: Diagnosis not present

## 2018-02-22 DIAGNOSIS — S14122D Central cord syndrome at C2 level of cervical spinal cord, subsequent encounter: Secondary | ICD-10-CM | POA: Diagnosis not present

## 2018-02-22 DIAGNOSIS — M6281 Muscle weakness (generalized): Secondary | ICD-10-CM | POA: Diagnosis not present

## 2018-02-22 DIAGNOSIS — G8250 Quadriplegia, unspecified: Secondary | ICD-10-CM | POA: Diagnosis not present

## 2018-02-23 DIAGNOSIS — M6281 Muscle weakness (generalized): Secondary | ICD-10-CM | POA: Diagnosis not present

## 2018-02-23 DIAGNOSIS — S14122D Central cord syndrome at C2 level of cervical spinal cord, subsequent encounter: Secondary | ICD-10-CM | POA: Diagnosis not present

## 2018-02-23 DIAGNOSIS — G8250 Quadriplegia, unspecified: Secondary | ICD-10-CM | POA: Diagnosis not present

## 2018-02-24 DIAGNOSIS — S14122D Central cord syndrome at C2 level of cervical spinal cord, subsequent encounter: Secondary | ICD-10-CM | POA: Diagnosis not present

## 2018-02-24 DIAGNOSIS — M6281 Muscle weakness (generalized): Secondary | ICD-10-CM | POA: Diagnosis not present

## 2018-02-24 DIAGNOSIS — G8250 Quadriplegia, unspecified: Secondary | ICD-10-CM | POA: Diagnosis not present

## 2018-02-27 DIAGNOSIS — M6281 Muscle weakness (generalized): Secondary | ICD-10-CM | POA: Diagnosis not present

## 2018-02-27 DIAGNOSIS — G8250 Quadriplegia, unspecified: Secondary | ICD-10-CM | POA: Diagnosis not present

## 2018-02-27 DIAGNOSIS — S14122D Central cord syndrome at C2 level of cervical spinal cord, subsequent encounter: Secondary | ICD-10-CM | POA: Diagnosis not present

## 2018-02-28 DIAGNOSIS — M6281 Muscle weakness (generalized): Secondary | ICD-10-CM | POA: Diagnosis not present

## 2018-02-28 DIAGNOSIS — G8250 Quadriplegia, unspecified: Secondary | ICD-10-CM | POA: Diagnosis not present

## 2018-02-28 DIAGNOSIS — S14122D Central cord syndrome at C2 level of cervical spinal cord, subsequent encounter: Secondary | ICD-10-CM | POA: Diagnosis not present

## 2018-03-01 DIAGNOSIS — M6281 Muscle weakness (generalized): Secondary | ICD-10-CM | POA: Diagnosis not present

## 2018-03-01 DIAGNOSIS — L89143 Pressure ulcer of left lower back, stage 3: Secondary | ICD-10-CM | POA: Diagnosis not present

## 2018-03-01 DIAGNOSIS — L97829 Non-pressure chronic ulcer of other part of left lower leg with unspecified severity: Secondary | ICD-10-CM | POA: Diagnosis not present

## 2018-03-01 DIAGNOSIS — S14122D Central cord syndrome at C2 level of cervical spinal cord, subsequent encounter: Secondary | ICD-10-CM | POA: Diagnosis not present

## 2018-03-01 DIAGNOSIS — G8250 Quadriplegia, unspecified: Secondary | ICD-10-CM | POA: Diagnosis not present

## 2018-03-01 DIAGNOSIS — L89214 Pressure ulcer of right hip, stage 4: Secondary | ICD-10-CM | POA: Diagnosis not present

## 2018-03-02 ENCOUNTER — Encounter (HOSPITAL_COMMUNITY): Payer: Self-pay | Admitting: Emergency Medicine

## 2018-03-02 ENCOUNTER — Observation Stay (HOSPITAL_COMMUNITY)
Admission: EM | Admit: 2018-03-02 | Discharge: 2018-03-04 | Disposition: A | Discharge status: Skilled Nursing Facility | Payer: Medicare Other | Attending: Family Medicine | Admitting: Family Medicine

## 2018-03-02 ENCOUNTER — Other Ambulatory Visit: Payer: Self-pay

## 2018-03-02 DIAGNOSIS — Z86718 Personal history of other venous thrombosis and embolism: Secondary | ICD-10-CM | POA: Diagnosis not present

## 2018-03-02 DIAGNOSIS — D473 Essential (hemorrhagic) thrombocythemia: Secondary | ICD-10-CM | POA: Diagnosis not present

## 2018-03-02 DIAGNOSIS — M2550 Pain in unspecified joint: Secondary | ICD-10-CM | POA: Diagnosis not present

## 2018-03-02 DIAGNOSIS — L89323 Pressure ulcer of left buttock, stage 3: Secondary | ICD-10-CM

## 2018-03-02 DIAGNOSIS — G894 Chronic pain syndrome: Secondary | ICD-10-CM | POA: Diagnosis not present

## 2018-03-02 DIAGNOSIS — Z79899 Other long term (current) drug therapy: Secondary | ICD-10-CM | POA: Diagnosis not present

## 2018-03-02 DIAGNOSIS — K319 Disease of stomach and duodenum, unspecified: Secondary | ICD-10-CM | POA: Insufficient documentation

## 2018-03-02 DIAGNOSIS — Z5181 Encounter for therapeutic drug level monitoring: Secondary | ICD-10-CM | POA: Diagnosis not present

## 2018-03-02 DIAGNOSIS — D509 Iron deficiency anemia, unspecified: Principal | ICD-10-CM | POA: Insufficient documentation

## 2018-03-02 DIAGNOSIS — G8929 Other chronic pain: Secondary | ICD-10-CM | POA: Diagnosis not present

## 2018-03-02 DIAGNOSIS — K592 Neurogenic bowel, not elsewhere classified: Secondary | ICD-10-CM | POA: Diagnosis not present

## 2018-03-02 DIAGNOSIS — D7589 Other specified diseases of blood and blood-forming organs: Secondary | ICD-10-CM | POA: Insufficient documentation

## 2018-03-02 DIAGNOSIS — I10 Essential (primary) hypertension: Secondary | ICD-10-CM | POA: Diagnosis not present

## 2018-03-02 DIAGNOSIS — I825Y2 Chronic embolism and thrombosis of unspecified deep veins of left proximal lower extremity: Secondary | ICD-10-CM | POA: Diagnosis not present

## 2018-03-02 DIAGNOSIS — M869 Osteomyelitis, unspecified: Secondary | ICD-10-CM | POA: Insufficient documentation

## 2018-03-02 DIAGNOSIS — D649 Anemia, unspecified: Secondary | ICD-10-CM | POA: Diagnosis not present

## 2018-03-02 DIAGNOSIS — L89314 Pressure ulcer of right buttock, stage 4: Secondary | ICD-10-CM | POA: Diagnosis not present

## 2018-03-02 DIAGNOSIS — S14129S Central cord syndrome at unspecified level of cervical spinal cord, sequela: Secondary | ICD-10-CM | POA: Diagnosis not present

## 2018-03-02 DIAGNOSIS — L89214 Pressure ulcer of right hip, stage 4: Secondary | ICD-10-CM | POA: Diagnosis not present

## 2018-03-02 DIAGNOSIS — L89324 Pressure ulcer of left buttock, stage 4: Secondary | ICD-10-CM | POA: Insufficient documentation

## 2018-03-02 DIAGNOSIS — G9589 Other specified diseases of spinal cord: Secondary | ICD-10-CM | POA: Diagnosis not present

## 2018-03-02 DIAGNOSIS — G8250 Quadriplegia, unspecified: Secondary | ICD-10-CM | POA: Insufficient documentation

## 2018-03-02 DIAGNOSIS — F209 Schizophrenia, unspecified: Secondary | ICD-10-CM | POA: Diagnosis not present

## 2018-03-02 DIAGNOSIS — M8649 Chronic osteomyelitis with draining sinus, multiple sites: Secondary | ICD-10-CM | POA: Diagnosis not present

## 2018-03-02 DIAGNOSIS — R634 Abnormal weight loss: Secondary | ICD-10-CM | POA: Diagnosis not present

## 2018-03-02 DIAGNOSIS — Z7901 Long term (current) use of anticoagulants: Secondary | ICD-10-CM | POA: Insufficient documentation

## 2018-03-02 DIAGNOSIS — I82402 Acute embolism and thrombosis of unspecified deep veins of left lower extremity: Secondary | ICD-10-CM | POA: Diagnosis present

## 2018-03-02 DIAGNOSIS — X58XXXS Exposure to other specified factors, sequela: Secondary | ICD-10-CM | POA: Diagnosis not present

## 2018-03-02 DIAGNOSIS — D75839 Thrombocytosis, unspecified: Secondary | ICD-10-CM | POA: Diagnosis present

## 2018-03-02 DIAGNOSIS — R531 Weakness: Secondary | ICD-10-CM | POA: Diagnosis not present

## 2018-03-02 HISTORY — DX: Pneumonia, unspecified organism: J18.9

## 2018-03-02 HISTORY — DX: Bradycardia, unspecified: R00.1

## 2018-03-02 HISTORY — DX: Dysphagia, unspecified: R13.10

## 2018-03-02 HISTORY — DX: Disorder of kidney and ureter, unspecified: N28.9

## 2018-03-02 LAB — PREPARE RBC (CROSSMATCH)

## 2018-03-02 LAB — ABO/RH: ABO/RH(D): A POS

## 2018-03-02 MED ORDER — VITAMIN C 500 MG PO TABS
500.0000 mg | ORAL_TABLET | Freq: Two times a day (BID) | ORAL | Status: DC
  Administered 2018-03-02 – 2018-03-04 (×4): 500 mg via ORAL
  Filled 2018-03-02 (×4): qty 1

## 2018-03-02 MED ORDER — TIZANIDINE HCL 4 MG PO TABS
4.0000 mg | ORAL_TABLET | Freq: Four times a day (QID) | ORAL | Status: DC | PRN
Start: 2018-03-02 — End: 2018-03-04

## 2018-03-02 MED ORDER — ACETAMINOPHEN 650 MG RE SUPP: 650.0000 mg | Freq: Four times a day (QID) | RECTAL | Status: DC | PRN

## 2018-03-02 MED ORDER — SODIUM CHLORIDE 0.9 % IV SOLN: 10.0000 mL/h | Freq: Once | INTRAVENOUS | Status: DC

## 2018-03-02 MED ORDER — SENNOSIDES-DOCUSATE SODIUM 8.6-50 MG PO TABS
2.0000 | ORAL_TABLET | Freq: Two times a day (BID) | ORAL | Status: DC
  Administered 2018-03-02 – 2018-03-04 (×4): 2 via ORAL
  Filled 2018-03-02 (×4): qty 2

## 2018-03-02 MED ORDER — KETOROLAC TROMETHAMINE 30 MG/ML IJ SOLN
15.0000 mg | Freq: Once | INTRAMUSCULAR | Status: AC
  Administered 2018-03-02: 15 mg via INTRAVENOUS

## 2018-03-02 MED ORDER — ALPRAZOLAM 0.25 MG PO TABS: 0.2500 mg | ORAL_TABLET | Freq: Two times a day (BID) | ORAL | Status: DC | PRN

## 2018-03-02 MED ORDER — ONDANSETRON HCL 4 MG/2ML IJ SOLN: 4.0000 mg | Freq: Four times a day (QID) | INTRAMUSCULAR | Status: DC | PRN

## 2018-03-02 MED ORDER — BACLOFEN 10 MG PO TABS
5.0000 mg | ORAL_TABLET | Freq: Two times a day (BID) | ORAL | Status: DC
  Administered 2018-03-02 – 2018-03-04 (×4): 5 mg via ORAL
  Filled 2018-03-02 (×4): qty 1

## 2018-03-02 MED ORDER — GABAPENTIN 100 MG PO CAPS
100.0000 mg | ORAL_CAPSULE | Freq: Every day | ORAL | Status: DC
  Administered 2018-03-02 – 2018-03-03 (×2): 100 mg via ORAL
  Filled 2018-03-02 (×2): qty 1

## 2018-03-02 MED ORDER — DOCUSATE SODIUM 100 MG PO CAPS
100.0000 mg | ORAL_CAPSULE | Freq: Every day | ORAL | Status: DC
  Administered 2018-03-03 – 2018-03-04 (×2): 100 mg via ORAL
  Filled 2018-03-02 (×2): qty 1

## 2018-03-02 MED ORDER — ADULT MULTIVITAMIN W/MINERALS CH
1.0000 | ORAL_TABLET | Freq: Every day | ORAL | Status: DC
  Administered 2018-03-03 – 2018-03-04 (×2): 1 via ORAL
  Filled 2018-03-02 (×2): qty 1

## 2018-03-02 MED ORDER — PIPERACILLIN-TAZOBACTAM 3.375 G IVPB 30 MIN
3.3750 g | Freq: Once | INTRAVENOUS | Status: AC
Start: 2018-03-02 — End: 2018-03-02
  Administered 2018-03-02: 3.375 g via INTRAVENOUS
  Filled 2018-03-02: qty 50

## 2018-03-02 MED ORDER — KETOROLAC TROMETHAMINE 15 MG/ML IJ SOLN
INTRAMUSCULAR | Status: AC
  Filled 2018-03-02: qty 1

## 2018-03-02 MED ORDER — ONDANSETRON HCL 4 MG PO TABS: 4.0000 mg | ORAL_TABLET | Freq: Four times a day (QID) | ORAL | Status: DC | PRN

## 2018-03-02 MED ORDER — SODIUM CHLORIDE 0.9 % IV BOLUS
500.0000 mL | Freq: Once | INTRAVENOUS | Status: AC
Start: 2018-03-02 — End: 2018-03-02
  Administered 2018-03-02: 500 mL via INTRAVENOUS

## 2018-03-02 MED ORDER — ACETAMINOPHEN 325 MG PO TABS
650.0000 mg | ORAL_TABLET | Freq: Four times a day (QID) | ORAL | Status: DC | PRN
Start: 2018-03-02 — End: 2018-03-04
  Administered 2018-03-03: 650 mg via ORAL
  Filled 2018-03-02: qty 2

## 2018-03-02 MED ORDER — APIXABAN 5 MG PO TABS
5.0000 mg | ORAL_TABLET | Freq: Every day | ORAL | Status: DC
  Filled 2018-03-02: qty 1

## 2018-03-02 MED ORDER — HYDROCODONE-ACETAMINOPHEN 5-325 MG PO TABS: 1.0000 | ORAL_TABLET | Freq: Four times a day (QID) | ORAL | Status: DC | PRN

## 2018-03-02 MED ORDER — FLEET ENEMA 7-19 GM/118ML RE ENEM: 1.0000 | ENEMA | Freq: Every day | RECTAL | Status: DC | PRN

## 2018-03-02 MED ORDER — RISPERIDONE 0.5 MG PO TABS
0.5000 mg | ORAL_TABLET | Freq: Two times a day (BID) | ORAL | Status: DC
  Administered 2018-03-02 – 2018-03-04 (×4): 0.5 mg via ORAL
  Filled 2018-03-02 (×4): qty 1

## 2018-03-02 NOTE — Progress Notes (Signed)
Spoke to [employer] PA, who is caring for patient at facility to obtain more information regarding symptoms.  She states that he is not symptomatic at this time and is not actively bleeding.  States they will monitor over the weekend for any changes.  Wanted to advise her that T&C would not be done until tomorrow, due to transportation issues and transfusion would no occur until Monday. She was ok with the above scenario.

## 2018-03-02 NOTE — ED Triage Notes (Addendum)
PT Lives at [facility]. hgb was 6.9.a/o. Quadraplegic.   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 Large circular stage 4 wound noted to left buttocks. Pt has chronic foley cath in place as well. [facility] staff denies any black or bloody stools

## 2018-03-02 NOTE — ED Notes (Signed)
IV team called back and said would send someone in morning. EDP aware

## 2018-03-02 NOTE — Progress Notes (Signed)
ANTIBIOTIC CONSULT NOTE - INITIAL  Pharmacy Consult for Zosyn Indication: wound infection/osteomyelitis  No Known Allergies  Patient Measurements: Ht: 69 in Wt: 67.1kg (from 2 months ago)   Adjusted Body Weight: n/a  Vital Signs: Temp: 99.3 F (37.4 C) (06/13 1847) Temp Source: Oral (06/13 1847) BP: 101/68 (06/13 1847) Pulse Rate: 83 (06/13 1847) Intake/Output from previous day: No intake/output data recorded. Intake/Output from this shift: No intake/output data recorded.  Labs: No results for input(s): WBC, HGB, PLT, LABCREA, CREATININE in the last 72 hours. CrCl cannot be calculated (Patient's most recent lab result is older than the maximum 21 days allowed.). No results for input(s): VANCOTROUGH, VANCOPEAK, VANCORANDOM, GENTTROUGH, GENTPEAK, GENTRANDOM, TOBRATROUGH, TOBRAPEAK, TOBRARND, AMIKACINPEAK, AMIKACINTROU, AMIKACIN in the last 72 hours.   Microbiology: Recent Results (from the past 720 hour(s))  Urine Culture     Status: Abnormal   Collection Time: 02/08/18 12:30 PM  Result Value Ref Range Status   Specimen Description   Final    URINE, CATHETERIZED Performed at [facility], [address] [place], [place] [address]    Special Requests   Final    NONE Performed at [facility], [address], [place], [place] [address]    Culture >=100,000 COLONIES/mL ENTEROCOCCUS FAECALIS (A)  Final   Report Status 02/11/2018 FINAL  Final   Organism ID, Bacteria ENTEROCOCCUS FAECALIS (A)  Final      Susceptibility   Enterococcus faecalis - MIC*    AMPICILLIN <=2 SENSITIVE Sensitive     LEVOFLOXACIN 1 SENSITIVE Sensitive     NITROFURANTOIN <=16 SENSITIVE Sensitive     VANCOMYCIN 1 SENSITIVE Sensitive     * >=100,000 COLONIES/mL ENTEROCOCCUS FAECALIS    Medical History: Past Medical History:  Diagnosis Date  . Bradycardia   . Cataracts, bilateral   . Dysphagia   . HTN (hypertension)   . Pneumonia   . Quadriplegia ([place])   . Renal disorder   . 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 Patient is a resident of [facility] and has a chronic foley that is obstructed. Currently awaiting PICC line placement for long-term antibiotic therapy.  Goal of Therapy:  Resolution of clinical symptoms.  Plan:  Zosyn 3.375gm IV x1 dose over 30 minutes Will await SCr and weight before further doses are ordered. Follow-up micro data, labs, vitals.   Despina Pole 03/02/2018,7:12 PM

## 2018-03-02 NOTE — ED Notes (Signed)
Patient repositioned in bed.

## 2018-03-02 NOTE — ED Notes (Signed)
Rounded on pt. Pt iv was out. This rn changed bed linen and re started IV

## 2018-03-02 NOTE — H&P (Signed)
History and Physical    CANYON LOHR BHA:[id number] DOB: May 24, 1946 DOA: 03/02/2018  PCP: Caprice Renshaw, MD   Patient coming from: SNF   Chief Complaint: Low Hgb, osteomyelitis   HPI: Dean Oliver is a 72 y.o. male with medical history significant for quadriplegia, schizophrenia, ischial pressure ulcer with underlying osteomyelitis, now presenting from his SNF for evaluation of low hemoglobin.  Patient is being managed at his nursing facility for osteomyelitis involving the pelvis.  Blood work was collected today and notable for hemoglobin of 6.9, down from 8.9 in March.  MCV is 75.2, previously normal.  There has not been any gross bleeding.  He has been found to have osteomyelitis involving the pelvis, started on Zosyn, but PICC is requested by SNF due to poor IV access.  Patient has not been expressing any specific complaints.  ED Course: Upon arrival to the ED, patient is found to be afebrile, saturating well on room air, and with vitals otherwise stable.  EKG features a sinus rhythm.  Chemistry panel drawn at the SNF earlier today is unremarkable and CBC is notable for hemoglobin of 6.9 with MCV of 75.2.  Platelets are elevated to 600,000.  Patient was treated with 500 cc normal saline, Toradol, and 2 units of packed red blood cells were ordered for immediate transfusion.  IV team was consulted for PEG placement.  Patient remains hemodynamically stable and will be admitted for ongoing evaluation and management.  Review of Systems:  Unable to complete ROS secondary to the patient's clinical condition.  Past Medical History:  Diagnosis Date  . Bradycardia   . Cataracts, bilateral   . Dysphagia   . HTN (hypertension)   . Pneumonia   . Quadriplegia ([place])   . Renal disorder   . Schizophrenia [facility])     Past Surgical History:  Procedure Laterality Date  . None       reports that he has never smoked. He has never used smokeless tobacco. He reports that he does not drink alcohol or use  drugs.  No Known Allergies  Family History  Family history unknown: Yes     Prior to Admission medications   Medication Sig Start Date End Date Taking? Authorizing Provider  acetaminophen (TYLENOL) 325 MG tablet Take 2 tablets (650 mg total) by mouth every 6 (six) hours as needed for mild pain, moderate pain or fever. 10/10/17   Johnson, Clanford L, MD  ALPRAZolam (XANAX) 0.25 MG tablet Take 1 tablet (0.25 mg total) by mouth 2 (two) times daily as needed for anxiety. 11/16/17   Johnson, Clanford L, MD  apixaban (ELIQUIS) 5 MG TABS tablet Take 2 po BID thru 3/6, then on 3/7 start 1 po BID. Patient taking differently: Take 5 mg by mouth daily.  11/16/17   Johnson, Clanford L, MD  Baclofen 5 MG TABS Take 5 mg by mouth 2 (two) times daily. 09/07/17   Angiulli, Lavon Paganini, PA-C  docusate sodium (COLACE) 100 MG capsule Take 100 mg by mouth daily.    [provider]  ferrous sulfate 325 (65 FE) MG tablet Take 325 mg by mouth 2 (two) times daily with a meal.     [provider]  gabapentin (NEURONTIN) 100 MG capsule Take 100 mg by mouth at bedtime.    [provider]  HYDROcodone-acetaminophen (NORCO/VICODIN) 5-325 MG tablet Take 1 tablet by mouth every 6 (six) hours as needed for severe pain. 11/16/17   Murlean Iba, MD  Multiple Vitamins-Minerals (CERTAVITE SENIOR/ANTIOXIDANT)  TABS Take 1 tablet by mouth daily.    [provider]  risperiDONE (RISPERDAL) 0.5 MG tablet Take 1 tablet (0.5 mg total) by mouth 2 (two) times daily. 09/07/17   Angiulli, Lavon Paganini, PA-C  senna-docusate (SENOKOT-S) 8.6-50 MG tablet Take 2 tablets by mouth 2 (two) times daily. 10/10/17   Johnson, Clanford L, MD  sodium phosphate (FLEET) 7-19 GM/118ML ENEM Place 133 mLs (1 enema total) rectally daily as needed for severe constipation. 09/07/17   Angiulli, Lavon Paganini, PA-C  tiZANidine (ZANAFLEX) 4 MG tablet Take 1 tablet (4 mg total) by mouth every 6 (six) hours as needed for muscle spasms.  09/07/17   Angiulli, Lavon Paganini, PA-C  vitamin C (ASCORBIC ACID) 500 MG tablet Take 500 mg by mouth 2 (two) times daily.     [provider]    Physical Exam: Vitals:   03/02/18 1830 03/02/18 1845 03/02/18 1847 03/02/18 1900  BP: 101/68  101/68 124/73  Pulse: 73 83 83 84  Resp: 14 20 20  (!) 27  Temp:   99.3 F (37.4 C)   TempSrc:   Oral   SpO2: 98% 98%  98%      Constitutional: NAD, calm  Eyes: PERTLA, lids and conjunctivae normal ENMT: Mucous membranes are moist. Posterior pharynx clear of any exudate or lesions.   Neck: normal, supple, no masses, no thyromegaly Respiratory: clear to auscultation bilaterally, no wheezing, no crackles. Normal respiratory effort.   Cardiovascular: S1 & S2 heard, regular rate and rhythm. No significant JVD. Abdomen: No distension, no tenderness, soft. Bowel sounds active.  Musculoskeletal: no clubbing / cyanosis. Deep ulcer over left ischium.    Skin: no significant rashes, lesions, ulcers. Warm, dry, well-perfused. Neurologic: No gross facial asymmetry. Quadriplegia.   Psychiatric:  Alert and oriented to person, place, and situation. Calm, cooperative.     Labs on Admission: I have personally reviewed following labs and imaging studies  CBC: No results for input(s): WBC, NEUTROABS, HGB, HCT, MCV, PLT in the last 168 hours. Basic Metabolic Panel: No results for input(s): NA, K, CL, CO2, GLUCOSE, BUN, CREATININE, CALCIUM, MG, PHOS in the last 168 hours. GFR: CrCl cannot be calculated (Patient's most recent lab result is older than the maximum 21 days allowed.). Liver Function Tests: No results for input(s): AST, ALT, ALKPHOS, BILITOT, PROT, ALBUMIN in the last 168 hours. No results for input(s): LIPASE, AMYLASE in the last 168 hours. No results for input(s): AMMONIA in the last 168 hours. Coagulation Profile: No results for input(s): INR, PROTIME in the last 168 hours. Cardiac Enzymes: No results for input(s): CKTOTAL, CKMB,  CKMBINDEX, TROPONINI in the last 168 hours. BNP (last 3 results) No results for input(s): PROBNP in the last 8760 hours. HbA1C: No results for input(s): HGBA1C in the last 72 hours. CBG: No results for input(s): GLUCAP in the last 168 hours. Lipid Profile: No results for input(s): CHOL, HDL, LDLCALC, TRIG, CHOLHDL, LDLDIRECT in the last 72 hours. Thyroid Function Tests: No results for input(s): TSH, T4TOTAL, FREET4, T3FREE, THYROIDAB in the last 72 hours. Anemia Panel: No results for input(s): VITAMINB12, FOLATE, FERRITIN, TIBC, IRON, RETICCTPCT in the last 72 hours. Urine analysis:    Component Value Date/Time   COLORURINE YELLOW 02/08/2018 1230   APPEARANCEUR CLOUDY (A) 02/08/2018 1230   LABSPEC 1.009 02/08/2018 1230   PHURINE 7.0 02/08/2018 1230   GLUCOSEU NEGATIVE 02/08/2018 1230   HGBUR MODERATE (A) 02/08/2018 1230   BILIRUBINUR NEGATIVE 02/08/2018 1230   [place] 02/08/2018 1230  PROTEINUR 30 (A) 02/08/2018 1230   NITRITE NEGATIVE 02/08/2018 1230   LEUKOCYTESUR LARGE (A) 02/08/2018 1230   Sepsis Labs: @LABRCNTIP (procalcitonin:4,lacticidven:4) )No results found for this or any previous visit (from the past 240 hour(s)).   Radiological Exams on Admission: No results found.  EKG: Independently reviewed. Sinus rhythm.   Assessment/Plan  1. Microcytic anemia  - Sent from SNF with Hgb of 6.9, down from 8.9 in March  - Scant blood from pressure ulcer, no gross blood in stool  - Hemodynamically stable  - 2 units RBC ordered for transfusion  - Check anemia panel, check post-transfusion CBC    2. 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 Hx of DVT  - Continue Eliquis    4. Schizophrenia  - Continue Risperdal and prn Xanax    5. Chronic pain  - Continue home-regimen with Neurontin, prn Norco, and prn tizanidine  6. Thrombocytosis  - Platelets  elevated to 600k, increased from March - Likely reactive to #2    DVT prophylaxis: Eliquis  Code Status: Full  Family Communication: Family updated at bedside Consults called: None Admission status: Observation    Vianne Bulls, MD [facility] Pager [phone]  If 7PM-7AM, please contact night-coverage [email or web address] Password [facility]  03/02/2018, 7:19 PM

## 2018-03-02 NOTE — ED Provider Notes (Signed)
[facility] EMERGENCY DEPARTMENT Provider Note   CSN: [id number] Arrival date & time: 03/02/18  1526     History   Chief Complaint Chief Complaint  Patient presents with  . Anemia    HPI Dean Oliver is a 71 y.o. male.  HPI  The patient presents for treatment of anemia, found today to be a hemoglobin of 6.9.  It is unclear if there is been blood loss.  The patient is a poor historian.  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  He is here with his "responsible party," his second cousin who helps with his medical care.  The patient does not have a legal guardian or healthcare power of attorney.  His cousin reports that he has had a bedsore on his left buttock which has been worsening but now is reporting to be stabilizing.  Paperwork with the patient from his facility indicates that they are seeking placement of a PICC line, for administration of IV antibiotics, and request blood transfusion, 2 units packed red blood cells.  It is presumed that the patient has symptomatic anemia.  Initial blood pressure in the emergency department is low.  The patient's physician, a wound care managing doctor at his facility wanted to start Zosyn yesterday to help the wound heal.  The facility was unable to start an IV to begin this medication.  The patient has been receiving topical antibiotic treatment with Flagyl tablets which are crushed, dissolved in saline and then rubbed onto the buttocks.  This is an ongoing treatment.  Level 5 caveat-poor historian  Past Medical History:  Diagnosis Date  . Bradycardia   . Cataracts, bilateral   . Dysphagia   . HTN (hypertension)   . Pneumonia   . Quadriplegia ([place])   . Renal disorder   . Schizophrenia [facility])     Patient Active Problem List   Diagnosis Date Noted  . Osteomyelitis ([place]) 03/02/2018  . Microcytic anemia 03/02/2018  . Chronic pain 03/02/2018  . Thrombocytosis  ([place]) 03/02/2018  . Left leg DVT ([place]) 11/16/2017  . Pressure injury of skin 10/06/2017  . Quadriplegia ([place]) 10/05/2017  . Neurogenic bladder 08/18/2017  . Neurogenic bowel 08/18/2017  . Bacterial UTI 08/18/2017  . Central cord syndrome ([place]) 08/17/2017  . Dysphagia   . HNP (herniated nucleus pulposus) with myelopathy, cervical   . Benign essential HTN   . Schizophrenia [facility])     Past Surgical History:  Procedure Laterality Date  . None          Home Medications    Prior to Admission medications   Medication Sig Start Date End Date Taking? Authorizing Provider  acetaminophen (TYLENOL) 325 MG tablet Take 2 tablets (650 mg total) by mouth every 6 (six) hours as needed for mild pain, moderate pain or fever. 10/10/17   Johnson, Clanford L, MD  ALPRAZolam (XANAX) 0.25 MG tablet Take 1 tablet (0.25 mg total) by mouth 2 (two) times daily as needed for anxiety. 11/16/17   Johnson, Clanford L, MD  apixaban (ELIQUIS) 5 MG TABS tablet Take 2 po BID thru 3/6, then on 3/7 start 1 po BID. Patient taking differently: Take 5 mg by mouth daily.  11/16/17   Johnson, Clanford L, MD  Baclofen 5 MG TABS Take 5 mg by mouth 2 (two) times daily. 09/07/17   Angiulli, Lavon Paganini, PA-C  docusate sodium (COLACE) 100 MG capsule Take 100 mg by mouth daily.  [provider]  ferrous sulfate 325 (65 FE) MG tablet Take 325 mg by mouth 2 (two) times daily with a meal.     [provider]  gabapentin (NEURONTIN) 100 MG capsule Take 100 mg by mouth at bedtime.    [provider]  HYDROcodone-acetaminophen (NORCO/VICODIN) 5-325 MG tablet Take 1 tablet by mouth every 6 (six) hours as needed for severe pain. 11/16/17   Johnson, Clanford L, MD  Multiple Vitamins-Minerals (CERTAVITE SENIOR/ANTIOXIDANT) TABS Take 1 tablet by mouth daily.    [provider]  risperiDONE (RISPERDAL) 0.5 MG tablet Take 1 tablet (0.5 mg total) by mouth 2 (two) times daily. 09/07/17   Angiulli, Lavon Paganini, PA-C    senna-docusate (SENOKOT-S) 8.6-50 MG tablet Take 2 tablets by mouth 2 (two) times daily. 10/10/17   Johnson, Clanford L, MD  sodium phosphate (FLEET) 7-19 GM/118ML ENEM Place 133 mLs (1 enema total) rectally daily as needed for severe constipation. 09/07/17   Angiulli, Lavon Paganini, PA-C  tiZANidine (ZANAFLEX) 4 MG tablet Take 1 tablet (4 mg total) by mouth every 6 (six) hours as needed for muscle spasms. 09/07/17   Angiulli, Lavon Paganini, PA-C  vitamin C (ASCORBIC ACID) 500 MG tablet Take 500 mg by mouth 2 (two) times daily.     [provider]    Family History Family History  Family history unknown: Yes    Social History Social History   Tobacco Use  . Smoking status: Never Smoker  . Smokeless tobacco: Never Used  Substance Use Topics  . Alcohol use: No    Frequency: Never  . Drug use: No     Allergies   Patient has no known allergies.   Review of Systems Review of Systems  Unable to perform ROS: Other     Physical Exam Updated Vital Signs BP 132/81   Pulse 85   Temp 98.9 F (37.2 C) (Oral)   Resp 19   SpO2 98%   Physical Exam  Constitutional: He is oriented to person, place, and time. He appears well-developed.  Frail, elderly  HENT:  Head: Normocephalic and atraumatic.  Right Ear: External ear normal.  Left Ear: External ear normal.  Eyes: Pupils are equal, round, and reactive to light. Conjunctivae and EOM are normal.  Neck: Normal range of motion and phonation normal. Neck supple.  Cardiovascular: Normal rate, regular rhythm and normal heart sounds.  Pulmonary/Chest: Effort normal and breath sounds normal. He exhibits no bony tenderness.  Abdominal: Soft. There is no tenderness.  Genitourinary:  Genitourinary Comments: Foley catheter in penis.  Normal external genital exam  Musculoskeletal: Normal range of motion.  Neurological: He is alert and oriented to person, place, and time. He exhibits abnormal muscle tone.  He is quadriparetic  Skin: Skin  is warm, dry and intact.  Deep wound left buttocks, roughly 4 x 6, horizontal aspect x 4 cm deep.  This wound appears to be subacute, without active infection.  There is a small amount of serosanguineous discharge associated with the wound bed.  There is no associated fluctuance, or tenderness, at the site of this wound.  There is a small amount of superficial skin breakdown over the lower sacrum and coccygeal area in the midline.  There is no bleeding, fluctuance or tenderness associated with this wound.  Psychiatric: He has a normal mood and affect. His behavior is normal.  Nursing note and vitals reviewed.    ED Treatments / Results  Labs (all labs ordered are listed, but only  abnormal results are displayed) Labs Reviewed  VITAMIN B12  FOLATE  IRON AND TIBC  FERRITIN  RETICULOCYTES  BASIC METABOLIC PANEL  CBC  CBC  PREPARE RBC (CROSSMATCH)  TYPE AND SCREEN  ABO/RH    EKG None  Radiology No results found.  Procedures .Critical Care Performed by: Daleen Bo, MD Authorized by: Daleen Bo, MD   Critical care provider statement:    Critical care time (minutes):  35   Critical care start time:  03/02/2018 3:45 PM   Critical care end time:  03/02/2018 7:30 PM   Critical care time was exclusive of:  Separately billable procedures and treating other patients   Critical care was necessary to treat or prevent imminent or life-threatening deterioration of the following conditions:  Circulatory failure   Critical care was time spent personally by me on the following activities:  Blood draw for specimens, development of treatment plan with patient or surrogate, discussions with consultants, evaluation of patient's response to treatment, examination of patient, obtaining history from patient or surrogate, ordering and performing treatments and interventions, ordering and review of laboratory studies, pulse oximetry, re-evaluation of patient's condition, review of old charts and  ordering and review of radiographic studies   (including critical care time)  Medications Ordered in ED Medications  0.9 %  sodium chloride infusion (has no administration in time range)  ketorolac (TORADOL) 15 MG/ML injection (has no administration in time range)  ALPRAZolam (XANAX) tablet 0.25 mg (has no administration in time range)  apixaban (ELIQUIS) tablet 5 mg (has no administration in time range)  Baclofen TABS 5 mg (has no administration in time range)  docusate sodium (COLACE) capsule 100 mg (has no administration in time range)  gabapentin (NEURONTIN) capsule 100 mg (has no administration in time range)  HYDROcodone-acetaminophen (NORCO/VICODIN) 5-325 MG per tablet 1 tablet (has no administration in time range)  multivitamin with minerals tablet 1 tablet (has no administration in time range)  risperiDONE (RISPERDAL) tablet 0.5 mg (has no administration in time range)  senna-docusate (Senokot-S) tablet 2 tablet (has no administration in time range)  sodium phosphate (FLEET) 7-19 GM/118ML enema 1 enema (has no administration in time range)  tiZANidine (ZANAFLEX) tablet 4 mg (has no administration in time range)  vitamin C (ASCORBIC ACID) tablet 500 mg (has no administration in time range)  acetaminophen (TYLENOL) tablet 650 mg (has no administration in time range)    Or  acetaminophen (TYLENOL) suppository 650 mg (has no administration in time range)  ondansetron (ZOFRAN) tablet 4 mg (has no administration in time range)    Or  ondansetron (ZOFRAN) injection 4 mg (has no administration in time range)  ketorolac (TORADOL) 30 MG/ML injection 15 mg (15 mg Intravenous Given 03/02/18 1740)  sodium chloride 0.9 % bolus 500 mL (500 mLs Intravenous New Bag/Given 03/02/18 1807)  piperacillin-tazobactam (ZOSYN) IVPB 3.375 g (0 g Intravenous Stopped 03/02/18 1834)     Initial Impression / Assessment and Plan / ED Course  I have reviewed the triage vital signs and the nursing  notes.  Pertinent labs & imaging results that were available during my care of the patient were reviewed by me and considered in my medical decision making (see chart for details).  Clinical Course as of Mar 02 1929  Thu Mar 02, 2018  1619 The IV team was contacted, for placement of a PICC line.  They are unable to place the PICC line, until tomorrow.   [EW]    Clinical Course User Index [EW] Eulis Foster,  Vira Agar, MD   Hemoglobin  Date Value Ref Range Status  12/12/2017 8.9 (L) 13.0 - 17.0 g/dL Final  11/16/2017 8.6 (L) 13.0 - 17.0 g/dL Final  11/15/2017 9.0 (L) 13.0 - 17.0 g/dL Final  10/09/2017 8.4 (L) 13.0 - 17.0 g/dL Final    BUN  Date Value Ref Range Status  12/12/2017 16 6 - 20 mg/dL Final  11/16/2017 13 6 - 20 mg/dL Final  11/15/2017 19 6 - 20 mg/dL Final  10/09/2017 10 6 - 20 mg/dL Final   Creatinine, Ser  Date Value Ref Range Status  12/12/2017 0.59 (L) 0.61 - 1.24 mg/dL Final  11/16/2017 0.49 (L) 0.61 - 1.24 mg/dL Final  11/15/2017 0.58 (L) 0.61 - 1.24 mg/dL Final  10/09/2017 0.32 (L) 0.61 - 1.24 mg/dL Final                 Patient Vitals for the past 24 hrs:  BP Temp Temp src Pulse Resp SpO2  03/02/18 1920 132/81 - - 85 19 98 %  03/02/18 1900 124/73 98.9 F (37.2 C) Oral 84 19 98 %  03/02/18 1847 101/68 99.3 F (37.4 C) Oral 83 20 -  03/02/18 1845 - - - 83 20 98 %  03/02/18 1830 101/68 - - 73 14 98 %  03/02/18 1810 - 99.6 F (37.6 C) Oral - - -  03/02/18 1805 135/87 - - 79 18 100 %  03/02/18 1800 135/87 - - - 18 -  03/02/18 1745 - - - 95 20 96 %  03/02/18 1731 (!) 98/59 - - 94 (!) 22 96 %  03/02/18 1630 126/80 - - 87 20 95 %  03/02/18 1600 120/80 - - - 17 -  03/02/18 1542 (!) 91/52 99 F (37.2 C) Oral 91 19 98 %      Medical Decision Making: Quadriparetic patient who has developed a pressure sore, deep, possibly stabilizing but require antibiotic treatment.  He apparently has incidental lowering of his hemoglobin to less than 7, which  prompted his caregivers to send him here for blood transfusion.  Coincidentally he requires an IV to be placed, so that he can get Zosyn.  EMS was able to place an IV.  The caregivers would like a PICC line placed so that he can receive prolonged IV treatment for assistance in wound healing of his pressure sore.  Blood transfusion begun in the emergency department.  CRITICAL CARE- yes Performed by: Daleen Bo  Nursing Notes Reviewed/ Care Coordinated Applicable Imaging Reviewed Interpretation of Laboratory Data incorporated into ED treatment  7:00 PM-Consult complete with hospitalist. Patient case explained and discussed.  He agrees to admit patient for further evaluation and treatment. Call ended at 7:15 PM  Plan: Admit  Final Clinical Impressions(s) / ED Diagnoses   Final diagnoses:  Anemia, unspecified type  Pressure injury of left buttock, stage 3 [facility])    ED Discharge Orders    None       Daleen Bo, MD 03/02/18 1930

## 2018-03-03 ENCOUNTER — Inpatient Hospital Stay (HOSPITAL_COMMUNITY)
Admission: RE | Admit: 2018-03-03 | Discharge: 2018-03-03 | Disposition: A | Discharge status: Home / Self Care | Payer: Medicare Other | Source: Ambulatory Visit

## 2018-03-03 ENCOUNTER — Other Ambulatory Visit: Payer: Self-pay

## 2018-03-03 DIAGNOSIS — I825Y2 Chronic embolism and thrombosis of unspecified deep veins of left proximal lower extremity: Secondary | ICD-10-CM | POA: Diagnosis not present

## 2018-03-03 DIAGNOSIS — D473 Essential (hemorrhagic) thrombocythemia: Secondary | ICD-10-CM | POA: Diagnosis not present

## 2018-03-03 DIAGNOSIS — D649 Anemia, unspecified: Secondary | ICD-10-CM | POA: Diagnosis not present

## 2018-03-03 DIAGNOSIS — M8649 Chronic osteomyelitis with draining sinus, multiple sites: Secondary | ICD-10-CM | POA: Diagnosis not present

## 2018-03-03 DIAGNOSIS — D509 Iron deficiency anemia, unspecified: Secondary | ICD-10-CM | POA: Diagnosis not present

## 2018-03-03 DIAGNOSIS — F209 Schizophrenia, unspecified: Secondary | ICD-10-CM | POA: Diagnosis not present

## 2018-03-03 LAB — CBC
HCT: 30.4 % — ABNORMAL LOW (ref 39.0–52.0)
HEMATOCRIT: 30.7 % — AB (ref 39.0–52.0)
HEMOGLOBIN: 8.9 g/dL — AB (ref 13.0–17.0)
HEMOGLOBIN: 9 g/dL — AB (ref 13.0–17.0)
MCH: 24.1 pg — ABNORMAL LOW (ref 26.0–34.0)
MCH: 24 pg — AB (ref 26.0–34.0)
MCHC: 29.3 g/dL — ABNORMAL LOW (ref 30.0–36.0)
MCHC: 29.3 g/dL — ABNORMAL LOW (ref 30.0–36.0)
MCV: 81.9 fL (ref 78.0–100.0)
MCV: 82.1 fL (ref 78.0–100.0)
Platelets: 539 10*3/uL — ABNORMAL HIGH (ref 150–400)
Platelets: 541 10*3/uL — ABNORMAL HIGH (ref 150–400)
RBC: 3.74 MIL/uL — ABNORMAL LOW (ref 4.22–5.81)
RBC: 3.71 MIL/uL — AB (ref 4.22–5.81)
RDW: 17.7 % — ABNORMAL HIGH (ref 11.5–15.5)
RDW: 17.7 % — AB (ref 11.5–15.5)
WBC: 7.7 10*3/uL (ref 4.0–10.5)
WBC: 7.7 10*3/uL (ref 4.0–10.5)

## 2018-03-03 LAB — VITAMIN B12: Vitamin B-12: 215 pg/mL (ref 180–914)

## 2018-03-03 LAB — BASIC METABOLIC PANEL
ANION GAP: 6 (ref 5–15)
BUN: 17 mg/dL (ref 6–20)
CALCIUM: 8.5 mg/dL — AB (ref 8.9–10.3)
CO2: 27 mmol/L (ref 22–32)
Chloride: 107 mmol/L (ref 101–111)
Creatinine, Ser: 0.53 mg/dL — ABNORMAL LOW (ref 0.61–1.24)
GFR calc Af Amer: >60 mL/min (ref >60)
Glucose, Bld: 87 mg/dL (ref 65–99)
POTASSIUM: 3.7 mmol/L (ref 3.5–5.1)
SODIUM: 140 mmol/L (ref 135–145)

## 2018-03-03 LAB — RETICULOCYTES
RBC.: 3.71 MIL/uL — AB (ref 4.22–5.81)
Retic Count, Absolute: 81.6 10*3/uL (ref 19.0–186.0)
Retic Ct Pct: 2.2 % (ref 0.4–3.1)

## 2018-03-03 LAB — IRON AND TIBC
IRON: 60 ug/dL (ref 45–182)
SATURATION RATIOS: 35 % (ref 17.9–39.5)
TIBC: 171 ug/dL — AB (ref 250–450)
UIBC: 111 ug/dL

## 2018-03-03 LAB — FOLATE: Folate: 11.1 ng/mL (ref >5.9)

## 2018-03-03 LAB — GLUCOSE, CAPILLARY: Glucose-Capillary: 90 mg/dL (ref 65–99)

## 2018-03-03 LAB — FERRITIN: Ferritin: 223 ng/mL (ref 24–336)

## 2018-03-03 MED ORDER — SODIUM CHLORIDE 0.9% FLUSH
10.0000 mL | Freq: Two times a day (BID) | INTRAVENOUS | Status: DC
  Administered 2018-03-03 – 2018-03-04 (×3): 10 mL

## 2018-03-03 MED ORDER — APIXABAN 2.5 MG PO TABS
2.5000 mg | ORAL_TABLET | ORAL | Status: DC
  Administered 2018-03-03: 2.5 mg via ORAL
  Filled 2018-03-03: qty 1

## 2018-03-03 MED ORDER — COLLAGENASE 250 UNIT/GM EX OINT
TOPICAL_OINTMENT | Freq: Every day | CUTANEOUS | Status: DC
  Administered 2018-03-03 – 2018-03-04 (×2): via TOPICAL
  Filled 2018-03-03: qty 30

## 2018-03-03 MED ORDER — SODIUM CHLORIDE 0.9% FLUSH: 10.0000 mL | INTRAVENOUS | Status: DC | PRN

## 2018-03-03 MED ORDER — PIPERACILLIN-TAZOBACTAM 3.375 G IVPB
3.3750 g | Freq: Three times a day (TID) | INTRAVENOUS | Status: DC
  Administered 2018-03-03 – 2018-03-04 (×4): 3.375 g via INTRAVENOUS
  Filled 2018-03-03 (×5): qty 50

## 2018-03-03 MED ORDER — CYANOCOBALAMIN 1000 MCG/ML IJ SOLN
1000.0000 ug | Freq: Once | INTRAMUSCULAR | Status: AC
  Administered 2018-03-03: 1000 ug via INTRAMUSCULAR
  Filled 2018-03-03: qty 1

## 2018-03-03 NOTE — Clinical Social Work Note (Addendum)
Clinical Social Work Assessment  Patient Details  Name: Dean Oliver MRN: [id number] Date of Birth: Apr 02, 1946  Date of referral:  03/03/18               Reason for consult:  Other (Comment Required)(From [place])                Permission sought to share information with:    Permission granted to share information::     Name::        Agency::  Debbie at [facility]   Relationship::     Contact Information:     Housing/Transportation Living arrangements for the past 2 months:  [place] of Information:  Facility Patient Interpreter Needed:  None Criminal Activity/Legal Involvement Pertinent to Current Situation/Hospitalization:  No - Comment as needed Significant Relationships:  Other Family Members Lives with:  Facility Resident Do you feel safe going back to the place where you live?  Yes Need for family participation in patient care:  Yes (Comment)  Care giving concerns:  None identified. Long term care facility resident.    Social Worker assessment / plan:   Patient has been at [facility] 09/07/17. He requires extensive to total assistance for all ADLs. He is quadriplegic.  Patient uses a wheelchair and his family is supportive. Patient can return to the facility at discharge.  LCSW advised Debbie at [facility] that patient will discharge tomorrow and will need IV antibiotics.   Employment status:  Retired Forensic scientist:  Information systems manager, Medicaid In [place] PT Recommendations:  Not assessed at this time Information / Referral to community resources:     Patient/Family's Response to care:  Patient is a long term care resident at [facility].   Patient/Family's Understanding of and Emotional Response to Diagnosis, Current Treatment, and Prognosis:  Family has been made aware of patient's diagnosis, treatment and prognosis.   Emotional Assessment Appearance:  Appears stated age Attitude/Demeanor/Rapport:    Affect (typically observed):  Unable to Assess Orientation:   Oriented to Self, Oriented to Place Alcohol / Substance use:  Not Applicable Psych involvement (Current and /or in the community):  No (Comment)  Discharge Needs  Concerns to be addressed:  Discharge Planning Concerns Readmission within the last 30 days:  No Current discharge risk:  None Barriers to Discharge:  No Barriers Identified   Ihor Gully, LCSW 03/03/2018, 11:42 AM

## 2018-03-03 NOTE — Plan of Care (Signed)
Continue planned regimen 

## 2018-03-03 NOTE — Progress Notes (Signed)
Peripherally Inserted Central Catheter/Midline Placement  The IV Nurse has discussed with the patient and/or persons authorized to consent for the patient, the purpose of this procedure and the potential benefits and risks involved with this procedure.  The benefits include less needle sticks, lab draws from the catheter, and the patient may be discharged home with the catheter. Risks include, but not limited to, infection, bleeding, blood clot (thrombus formation), and puncture of an artery; nerve damage and irregular heartbeat and possibility to perform a PICC exchange if needed/ordered by physician.  Alternatives to this procedure were also discussed.  Bard Power PICC patient education guide, fact sheet on infection prevention and patient information card has been provided to patient /or left at bedside.    PICC/Midline Placement Documentation  PICC Single Lumen 00/17/49 PICC Left Cephalic 49 cm 3 cm (Active)  Indication for Insertion or Continuance of Line Home intravenous therapies (PICC only) 03/03/2018  9:00 AM  Exposed Catheter (cm) 3 cm 03/03/2018  9:00 AM  Site Assessment Clean;Dry;Intact 03/03/2018  9:00 AM  Line Status Flushed;Blood return noted 03/03/2018  9:00 AM  Dressing Type Transparent 03/03/2018  9:00 AM  Dressing Status Clean;Dry;Intact;Antimicrobial disc in place 03/03/2018  9:00 AM  Dressing Change Due 03/10/18 03/03/2018  9:00 AM       Jule Economy Horton 03/03/2018, 9:54 AM

## 2018-03-03 NOTE — Plan of Care (Signed)
Continue with planned regimen 

## 2018-03-03 NOTE — Consult Note (Addendum)
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  Reviewed photo in the EMR Wound type: unstageable pressure injury Pressure Injury POA: Yes Wound bed: 100% slough/eschar Drainage (amount, consistency, odor) mod amt tan drainage, some odor noted. Dressing procedure/placement/frequency: Santyl for enzymatic debridement of nonviable tissue.  Pt could benefit from surgical consult for possible debridement.  EMR indicates pt has osteomylitis, but I could not located those results in the chart. This complex medical condition is beyond the [facility] scope of practice. Discussed plan of care with primary team via phone call. Please re-consult if further assistance is needed.  Thank-you,  Julien Girt MSN, [place], [place], [place], [place]

## 2018-03-03 NOTE — Care Management Obs Status (Signed)
[facility] NOTIFICATION   Patient Details  Name: Dean Oliver MRN: [id number] Date of Birth: 1946/08/03   Medicare Observation Status Notification Given:  Yes    Sherald Barge, RN 03/03/2018, 12:21 PM

## 2018-03-03 NOTE — Progress Notes (Signed)
Pharmacy Antibiotic Note  Dean Oliver is a 72 y.o. male admitted on 03/02/2018 with wound infection.  Pharmacy has been consulted for Zosyn dosing.  Plan: Zosyn 3.375g IV q8h (4 hour infusion).  Monitor labs, micro and vitals.      Temp (24hrs), Avg:99 F (37.2 C), Min:98.3 F (36.8 C), Max:99.6 F (37.6 C)  Recent Labs  Lab 03/03/18 0640  WBC 7.7  7.7  CREATININE 0.53*    CrCl cannot be calculated (Unknown ideal weight.).    No Known Allergies  Antimicrobials this admission: Zosyn 6/13 >>    >>   Dose adjustments this admission: n/a   Microbiology results:  BCx:   UCx:    Sputum:    MRSA PCR:   Thank you for allowing pharmacy to be a part of this patient's care.  Pricilla Larsson 03/03/2018 7:54 AM

## 2018-03-03 NOTE — NC FL2 (Signed)
[place] LEVEL OF CARE SCREENING TOOL     IDENTIFICATION  Patient Name: Dean Oliver Birthdate: 13-Sep-1946 Sex: male Admission Date (Current Location): 03/02/2018  [facility] and [place] Number:  [employer] and Address:  [place] [address], [place]      Provider Number: [id number]  Attending Physician Name and Address:  Kathie Dike, MD  Relative Name and Phone Number:       Current Level of Care: Other (Comment)(observation) Recommended Level of Care: [place] Prior Approval Number:    Date Approved/Denied:   PASRR Number:    Discharge Plan: SNF    Current Diagnoses: Patient Active Problem List   Diagnosis Date Noted  . Osteomyelitis ([place]) 03/02/2018  . Microcytic anemia 03/02/2018  . Chronic pain 03/02/2018  . Thrombocytosis ([place]) 03/02/2018  . Left leg DVT ([place]) 11/16/2017  . Pressure injury of skin 10/06/2017  . Quadriplegia ([place]) 10/05/2017  . Neurogenic bladder 08/18/2017  . Neurogenic bowel 08/18/2017  . Bacterial UTI 08/18/2017  . Central cord syndrome ([place]) 08/17/2017  . Dysphagia   . HNP (herniated nucleus pulposus) with myelopathy, cervical   . Benign essential HTN   . Schizophrenia ([place])     Orientation RESPIRATION BLADDER Height & Weight     Self, Place  Normal Incontinent Weight: 136 lb 14.5 oz (62.1 kg) Height:     BEHAVIORAL SYMPTOMS/MOOD NEUROLOGICAL BOWEL NUTRITION STATUS      Incontinent Diet(regular)  AMBULATORY STATUS COMMUNICATION OF NEEDS Skin   Total Care Verbally (unstagable to ischial tuberosity)                       Personal Care Assistance Level of Assistance  Total care       Total Care Assistance: Maximum assistance   Functional Limitations Info  Sight, Hearing, Speech Sight Info: Adequate Hearing Info: Adequate Speech Info: Adequate    SPECIAL CARE FACTORS FREQUENCY                       Contractures Contractures Info:  Not present    Additional Factors Info  Code Status, Allergies, Psychotropic Code Status Info: Full code Allergies Info: NKA Psychotropic Info: Xanax, Risperdal         Current Medications (03/03/2018):  This is the current hospital active medication list Current Facility-Administered Medications  Medication Dose Route Frequency Provider Last Rate Last Dose  . 0.9 %  sodium chloride infusion  10 mL/hr Intravenous Once Opyd, Ilene Qua, MD      . acetaminophen (TYLENOL) tablet 650 mg  650 mg Oral Q6H PRN Opyd, Ilene Qua, MD       Or  . acetaminophen (TYLENOL) suppository 650 mg  650 mg Rectal Q6H PRN Opyd, Ilene Qua, MD      . ALPRAZolam Duanne Moron) tablet 0.25 mg  0.25 mg Oral BID PRN Opyd, Ilene Qua, MD      . apixaban (ELIQUIS) tablet 2.5 mg  2.5 mg Oral Q24H Memon, Jolaine Artist, MD      . baclofen (LIORESAL) tablet 5 mg  5 mg Oral BID Opyd, Ilene Qua, MD   5 mg at 03/03/18 1008  . collagenase (SANTYL) ointment   Topical Daily Memon, Jolaine Artist, MD      . docusate sodium (COLACE) capsule 100 mg  100 mg Oral Daily Opyd, Ilene Qua, MD   100 mg at 03/03/18 1007  . gabapentin (NEURONTIN) capsule 100 mg  100 mg Oral QHS Opyd, Ilene Qua, MD   100 mg at 03/02/18 2340  . HYDROcodone-acetaminophen (NORCO/VICODIN) 5-325 MG per tablet 1 tablet  1 tablet Oral Q6H PRN Opyd, Ilene Qua, MD      . multivitamin with minerals tablet 1 tablet  1 tablet Oral Daily Opyd, Ilene Qua, MD   1 tablet at 03/03/18 1005  . ondansetron (ZOFRAN) tablet 4 mg  4 mg Oral Q6H PRN Opyd, Ilene Qua, MD       Or  . ondansetron (ZOFRAN) injection 4 mg  4 mg Intravenous Q6H PRN Opyd, Ilene Qua, MD      . piperacillin-tazobactam (ZOSYN) IVPB 3.375 g  3.375 g Intravenous Q8H Memon, Jolaine Artist, MD 12.5 mL/hr at 03/03/18 1002 3.375 g at 03/03/18 1002  . risperiDONE (RISPERDAL) tablet 0.5 mg  0.5 mg Oral BID Opyd, Ilene Qua, MD   0.5 mg at 03/03/18 1007  . senna-docusate (Senokot-S) tablet 2 tablet  2 tablet Oral BID Opyd, Ilene Qua, MD   2  tablet at 03/03/18 1006  . sodium chloride flush (NS) 0.9 % injection 10-40 mL  10-40 mL Intracatheter Q12H Kathie Dike, MD   10 mL at 03/03/18 1010  . sodium chloride flush (NS) 0.9 % injection 10-40 mL  10-40 mL Intracatheter PRN Kathie Dike, MD      . sodium phosphate (FLEET) 7-19 GM/118ML enema 1 enema  1 enema Rectal Daily PRN Opyd, Ilene Qua, MD      . tiZANidine (ZANAFLEX) tablet 4 mg  4 mg Oral Q6H PRN Opyd, Ilene Qua, MD      . vitamin C (ASCORBIC ACID) tablet 500 mg  500 mg Oral BID Opyd, Ilene Qua, MD   500 mg at 03/03/18 1007     Discharge Medications: Please see discharge summary for a list of discharge medications.  Relevant Imaging Results:  Relevant Lab Results:   Additional Information Patient will return on IV antibiotics.  Hae Ahlers, Clydene Pugh, LCSW

## 2018-03-03 NOTE — Progress Notes (Signed)
PROGRESS NOTE    Dean Oliver  HCW:[id number] DOB: 1945/10/23 DOA: 03/02/2018 PCP: Caprice Renshaw, MD    Brief Narrative:  72 year old male with a history of quadriplegia who is a resident of a nursing facility, was sent to the emergency room for low hemoglobin of 6.9 as well as nonhealing left ischial wound.  He was being treated for possible osteomyelitis at the nursing home and needed IV access for intravenous antibiotics.  PICC line was requested.  Patient was transfused 2 units of PRBC for anemia.  Anemia panel indicated chronic disease.  Wound was evaluated by wound care services who recommended topical treatment as well as general surgery consult for possible debridement.   Assessment & Plan:   Principal Problem:   Microcytic anemia Active Problems:   Schizophrenia ([place])   Left leg DVT (HCC)   Osteomyelitis (HCC)   Chronic pain   Thrombocytosis (HCC)   1. Anemia.  No evidence of ongoing bleeding.  Anemia panel indicates evidence of chronic disease.  B12 is also noted to be in the lower range of normal.  He was transfused 2 units of PRBC with improvement of hemoglobin from 6.9-8.9.  Continue to monitor. 2. Left issue along with concern for underlying osteomyelitis.  Patient is being managed at skilled nurse facility for left ischial wound.  Due to concern for underlying osteomyelitis at facility, request was made for patient to have PICC line placed that he may receive IV antibiotics, namely Zosyn at the facility.  Wound care has seen the patient and recommended topical treatments, but also recommends surgical evaluation to see if further surgical debridement is necessary.  Will request general surgery input. 3. History of DVT.  Continue on apixaban. 4. Schizophrenia.  Continue on Risperdal and as needed Xanax. 5. Chronic pain syndrome.  Continue home regimen with Neurontin, PRN Norco and as needed tizanidine 6. Thrombocytosis.  Suspect this is reactive to chronic inflammation from  nonhealing wound. 7. Quadriplegia following neck injury.   DVT prophylaxis: Apixaban Code Status: Full code Family Communication: No family present Disposition Plan: Return to nursing facility on discharge   Consultants:   Wound care  Procedures:     Antimicrobials:   Zosyn 6/14 >   Subjective: No new complaints.  No shortness of breath.  Objective: Vitals:   03/03/18 0350 03/03/18 0622 03/03/18 1102 03/03/18 1406  BP: 131/80 (!) 152/97  (!) 134/92  Pulse: 79 85  88  Resp: 18   18  Temp: 99.1 F (37.3 C) 98.7 F (37.1 C)  98.9 F (37.2 C)  TempSrc:  Oral  Oral  SpO2:  100%  100%  Weight:   62.1 kg (136 lb 14.5 oz)     Intake/Output Summary (Last 24 hours) at 03/03/2018 1837 Last data filed at 03/03/2018 1700 Gross per 24 hour  Intake 1327 ml  Output 600 ml  Net 727 ml   Filed Weights   03/03/18 1102  Weight: 62.1 kg (136 lb 14.5 oz)    Examination:  General exam: Appears calm and comfortable  Respiratory system: Clear to auscultation. Respiratory effort normal. Cardiovascular system: S1 & S2 heard, RRR. No JVD, murmurs, rubs, gallops or clicks.  Gastrointestinal system: Abdomen is nondistended, soft and nontender. No organomegaly or masses felt. Normal bowel sounds heard. Central nervous system: Alert and oriented.  Quadriplegia Extremities: No edema in lower extremities Skin: Left ischial unstageable wound Psychiatry: Judgement and insight appear normal. Mood & affect appropriate.     Data Reviewed: I have  personally reviewed following labs and imaging studies  CBC: Recent Labs  Lab 03/03/18 0640  WBC 7.7  7.7  HGB 8.9*  9.0*  HCT 30.4*  30.7*  MCV 81.9  82.1  PLT 539*  177*   Basic Metabolic Panel: Recent Labs  Lab 03/03/18 0640  NA 140  K 3.7  CL 107  CO2 27  GLUCOSE 87  BUN 17  CREATININE 0.53*  CALCIUM 8.5*   GFR: Estimated Creatinine Clearance: 76.5 mL/min (A) (by C-G formula based on SCr of 0.53 mg/dL (L)). Liver  Function Tests: No results for input(s): AST, ALT, ALKPHOS, BILITOT, PROT, ALBUMIN in the last 168 hours. No results for input(s): LIPASE, AMYLASE in the last 168 hours. No results for input(s): AMMONIA in the last 168 hours. Coagulation Profile: No results for input(s): INR, PROTIME in the last 168 hours. Cardiac Enzymes: No results for input(s): CKTOTAL, CKMB, CKMBINDEX, TROPONINI in the last 168 hours. BNP (last 3 results) No results for input(s): PROBNP in the last 8760 hours. HbA1C: No results for input(s): HGBA1C in the last 72 hours. CBG: Recent Labs  Lab 03/03/18 0738  GLUCAP 90   Lipid Profile: No results for input(s): CHOL, HDL, LDLCALC, TRIG, CHOLHDL, LDLDIRECT in the last 72 hours. Thyroid Function Tests: No results for input(s): TSH, T4TOTAL, FREET4, T3FREE, THYROIDAB in the last 72 hours. Anemia Panel: Recent Labs    03/03/18 0640  VITAMINB12 215  FOLATE 11.1  FERRITIN 223  TIBC 171*  IRON 60  RETICCTPCT 2.2   Sepsis Labs: No results for input(s): PROCALCITON, LATICACIDVEN in the last 168 hours.  No results found for this or any previous visit (from the past 240 hour(s)).       Radiology Studies: No results found.      Scheduled Meds: . apixaban  2.5 mg Oral Q24H  . baclofen  5 mg Oral BID  . collagenase   Topical Daily  . docusate sodium  100 mg Oral Daily  . gabapentin  100 mg Oral QHS  . multivitamin with minerals  1 tablet Oral Daily  . risperiDONE  0.5 mg Oral BID  . senna-docusate  2 tablet Oral BID  . sodium chloride flush  10-40 mL Intracatheter Q12H  . vitamin C  500 mg Oral BID   Continuous Infusions: . sodium chloride    . piperacillin-tazobactam (ZOSYN)  IV 3.375 g (03/03/18 1755)     LOS: 0 days    Time spent: 31mins    Kathie Dike, MD [facility] Pager [phone]  If 7PM-7AM, please contact night-coverage [email or web address] Password [facility] 03/03/2018, 6:37 PM

## 2018-03-04 ENCOUNTER — Encounter (HOSPITAL_COMMUNITY): Payer: Self-pay | Admitting: Family Medicine

## 2018-03-04 DIAGNOSIS — M8649 Chronic osteomyelitis with draining sinus, multiple sites: Secondary | ICD-10-CM | POA: Diagnosis not present

## 2018-03-04 DIAGNOSIS — G8929 Other chronic pain: Secondary | ICD-10-CM

## 2018-03-04 DIAGNOSIS — F209 Schizophrenia, unspecified: Secondary | ICD-10-CM

## 2018-03-04 DIAGNOSIS — I825Y2 Chronic embolism and thrombosis of unspecified deep veins of left proximal lower extremity: Secondary | ICD-10-CM | POA: Diagnosis not present

## 2018-03-04 DIAGNOSIS — D509 Iron deficiency anemia, unspecified: Secondary | ICD-10-CM | POA: Diagnosis not present

## 2018-03-04 DIAGNOSIS — D473 Essential (hemorrhagic) thrombocythemia: Secondary | ICD-10-CM

## 2018-03-04 DIAGNOSIS — L89214 Pressure ulcer of right hip, stage 4: Secondary | ICD-10-CM

## 2018-03-04 DIAGNOSIS — L89324 Pressure ulcer of left buttock, stage 4: Secondary | ICD-10-CM

## 2018-03-04 DIAGNOSIS — L89314 Pressure ulcer of right buttock, stage 4: Secondary | ICD-10-CM

## 2018-03-04 LAB — CBC
HCT: 32.3 % — ABNORMAL LOW (ref 39.0–52.0)
Hemoglobin: 9.2 g/dL — ABNORMAL LOW (ref 13.0–17.0)
MCH: 23.7 pg — ABNORMAL LOW (ref 26.0–34.0)
MCHC: 28.5 g/dL — ABNORMAL LOW (ref 30.0–36.0)
MCV: 83.2 fL (ref 78.0–100.0)
PLATELETS: 531 10*3/uL — AB (ref 150–400)
RBC: 3.88 MIL/uL — ABNORMAL LOW (ref 4.22–5.81)
RDW: 18.3 % — AB (ref 11.5–15.5)
WBC: 8.7 10*3/uL (ref 4.0–10.5)

## 2018-03-04 LAB — TYPE AND SCREEN
ABO/RH(D): A POS
ANTIBODY SCREEN: NEGATIVE
UNIT DIVISION: 0
UNIT DIVISION: 0

## 2018-03-04 LAB — BPAM RBC
BLOOD PRODUCT EXPIRATION DATE: 201907062359
Blood Product Expiration Date: 201907062359
ISSUE DATE / TIME: 201906140111
ISSUE DATE / TIME: 201906131838
UNIT TYPE AND RH: 6200
Unit Type and Rh: 6200

## 2018-03-04 LAB — BASIC METABOLIC PANEL
BUN: 16 mg/dL (ref 6–20)
CREATININE: 0.64 mg/dL (ref 0.61–1.24)
Calcium: 8.6 mg/dL — ABNORMAL LOW (ref 8.9–10.3)
Chloride: 104 mmol/L (ref 101–111)
GFR calc Af Amer: >60 mL/min (ref >60)
GFR calc non Af Amer: >60 mL/min (ref >60)
Glucose, Bld: 91 mg/dL (ref 65–99)
POTASSIUM: 3.9 mmol/L (ref 3.5–5.1)
Sodium: 137 mmol/L (ref 135–145)

## 2018-03-04 LAB — GLUCOSE, CAPILLARY: GLUCOSE-CAPILLARY: 94 mg/dL (ref 65–99)

## 2018-03-04 MED ORDER — COLLAGENASE 250 UNIT/GM EX OINT: TOPICAL_OINTMENT | CUTANEOUS | 0 refills | Status: AC

## 2018-03-04 MED ORDER — COLLAGENASE 250 UNIT/GM EX OINT
TOPICAL_OINTMENT | Freq: Two times a day (BID) | CUTANEOUS | Status: DC
  Filled 2018-03-04: qty 30

## 2018-03-04 MED ORDER — ALPRAZOLAM 0.25 MG PO TABS: 0.2500 mg | ORAL_TABLET | Freq: Two times a day (BID) | ORAL | 0 refills | Status: AC | PRN

## 2018-03-04 MED ORDER — COLLAGENASE 250 UNIT/GM EX OINT: TOPICAL_OINTMENT | Freq: Every day | CUTANEOUS | 0 refills | Status: DC

## 2018-03-04 MED ORDER — CERTAVITE SENIOR/ANTIOXIDANT PO TABS: 1.0000 | ORAL_TABLET | Freq: Every day | ORAL | Status: AC

## 2018-03-04 MED ORDER — DOCUSATE SODIUM 100 MG PO CAPS: 100.0000 mg | ORAL_CAPSULE | Freq: Every day | ORAL | 0 refills | Status: AC

## 2018-03-04 NOTE — Discharge Summary (Addendum)
Physician Discharge Summary  Dean Oliver ZOX:[id number] DOB: [birth year] DOA: 03/02/2018  PCP: Caprice Renshaw, MD  Admit date: 03/02/2018 Discharge date: 03/04/2018  Admitted From: [facility] Disposition: [facility]   Recommendations for Outpatient Follow-up:  1. Please check CBC in 1 week.  2. Wound Care instructions:  Apply Santyl to left ischial, right ischial wound and right trocanteric wound twice daily and place new moist fluffed gauze to the area then cover with and ABD pad and tape  AIR MATTRESS RECOMMENDED  Discharge Condition: STABLE   CODE STATUS: FULL    Brief Hospitalization Summary: Please see all hospital notes, images, labs for full details of the hospitalization.  HPI: MINA CARLISI is a 72 y.o. male with medical history significant for quadriplegia, schizophrenia, ischial pressure ulcer with underlying osteomyelitis, now presenting from his SNF for evaluation of low hemoglobin.  Patient is being managed at his nursing facility for osteomyelitis involving the pelvis.  Blood work was collected today and notable for hemoglobin of 6.9, down from 8.9 in March.  MCV is 75.2, previously normal.  There has not been any gross bleeding.  He has been found to have osteomyelitis involving the pelvis, started on Zosyn, but PICC is requested by SNF due to poor IV access.  Patient has not been expressing any specific complaints.  ED Course: Upon arrival to the ED, patient is found to be afebrile, saturating well on room air, and with vitals otherwise stable.  EKG features a sinus rhythm.  Chemistry panel drawn at the SNF earlier today is unremarkable and CBC is notable for hemoglobin of 6.9 with MCV of 75.2.  Platelets are elevated to 600,000.  Patient was treated with 500 cc normal saline, Toradol, and 2 units of packed red blood cells were ordered for immediate transfusion.  IV team was consulted for PEG placement.  Patient remains hemodynamically stable and will be admitted for ongoing  evaluation and management.  Brief Narrative:  72 year old male with a history of quadriplegia who is a resident of a nursing facility, was sent to the emergency room for low hemoglobin of 6.9 as well as nonhealing left ischial wound.  He was being treated for possible osteomyelitis at the nursing home and needed IV access for intravenous antibiotics.  PICC line was requested.  Patient was transfused 2 units of PRBC for anemia.  Anemia panel indicated chronic disease.  Wound was evaluated by wound care services who recommended topical treatment as well as general surgery consult for possible debridement.   Assessment & Plan:   Principal Problem:   Microcytic anemia Active Problems:   Schizophrenia ([place])   Left leg DVT (HCC)   Osteomyelitis (HCC)   Chronic pain   Thrombocytosis (HCC)   1. Anemia.  No evidence of ongoing bleeding.  Anemia panel indicates evidence of chronic disease.  B12 is also noted to be in the lower range of normal.  He was transfused 2 units of PRBC with improvement of hemoglobin from 6.9-8.9.   2. Left issue along with concern for underlying osteomyelitis.  Patient is being managed at skilled nurse facility for left ischial wound. Due to concern for underlying osteomyelitis at facility, request was made for patient to have PICC line placed that he may receive IV antibiotics.  He apparently had been on Zosyn,Vanc and flagyl at the SNF facility.  Wound care has seen the patient and recommended topical treatments with santyl , but also recommends surgical evaluation to see if further surgical debridement is necessary.  Surgery saw the patient and debridement is not needed, topical treatment is recommended.  See above wound care instructions.   3. History of DVT.  Continue on apixaban. 4. Schizophrenia.  Continue on Risperdal and as needed Xanax. 5. Chronic pain syndrome.  Continue home regimen with Neurontin, PRN Norco and as needed tizanidine 6. Thrombocytosis.  Suspect  this is reactive to chronic inflammation from nonhealing wound. 7. Quadriplegia following neck injury.   DVT prophylaxis: Apixaban Code Status: Full code Family Communication: No family present Disposition Plan: Return to nursing facility  Discharge Diagnoses:  Principal Problem:   Microcytic anemia Active Problems:   Schizophrenia ([place])   Pressure sore of left ischial area, stage IV (HCC)   Left leg DVT (HCC)   Osteomyelitis (HCC)   Chronic pain   Thrombocytosis ([place])  Discharge Instructions: Discharge Instructions    Discharge instructions   Complete by:  As directed    Apply Santyl to left ischial, right ischial wound and right trocanteric wound twice daily and place new moist fluffed gauze to the area then cover with  and ABD pad and tape  AIR MATTRESS RECOMMENDED     Allergies as of 03/04/2018   No Known Allergies     Medication List    TAKE these medications   acetaminophen 325 MG tablet Commonly known as:  TYLENOL Take 2 tablets (650 mg total) by mouth every 6 (six) hours as needed for mild pain, moderate pain or fever. What changed:  reasons to take this   ALPRAZolam 0.25 MG tablet Commonly known as:  XANAX Take 1 tablet (0.25 mg total) by mouth 2 (two) times daily as needed for anxiety.   Baclofen 5 MG Tabs Take 5 mg by mouth 2 (two) times daily.   CERTAVITE SENIOR/ANTIOXIDANT Tabs Take 1 tablet by mouth daily.   collagenase ointment Commonly known as:  SANTYL Apply Santyl to left ischial, right ischial wound and right trocanteric wound twice daily and place new moist fluffed gauze to the area then cover with  and ABD pad and tape.   docusate sodium 100 MG capsule Commonly known as:  COLACE Take 1 capsule (100 mg total) by mouth daily.   ELIQUIS 2.5 MG Tabs tablet Generic drug:  apixaban Take 2.5 mg by mouth 2 (two) times daily.   ferrous sulfate 325 (65 FE) MG tablet Take 325 mg by mouth 2 (two) times daily with a meal.   gabapentin 100 MG  capsule Commonly known as:  NEURONTIN Take 100 mg by mouth at bedtime.   magnesium hydroxide 400 MG/5ML suspension Commonly known as:  MILK OF MAGNESIA Take by mouth daily as needed for mild constipation.   metroNIDAZOLE 500 MG tablet Commonly known as:  FLAGYL Take 500 mg by mouth 2 (two) times daily.   piperacillin-tazobactam IVPB Commonly known as:  ZOSYN Inject 3.375 g into the vein every 6 (six) hours. 7 day course started 03/01/2018.   risperiDONE 0.5 MG tablet Commonly known as:  RISPERDAL Take 1 tablet (0.5 mg total) by mouth 2 (two) times daily.   senna-docusate 8.6-50 MG tablet Commonly known as:  Senokot-S Take 2 tablets by mouth 2 (two) times daily.   sodium phosphate 7-19 GM/118ML Enem Place 133 mLs (1 enema total) rectally daily as needed for severe constipation.   tiZANidine 4 MG tablet Commonly known as:  ZANAFLEX Take 1 tablet (4 mg total) by mouth every 6 (six) hours as needed for muscle spasms.   vancomycin IVPB Inject 1,000 mg into  the vein every 12 (twelve) hours. 14 day course, started 03/01/2018.   vitamin C 500 MG tablet Commonly known as:  ASCORBIC ACID Take 500 mg by mouth 2 (two) times daily.      Follow-up Information    [facility] Follow up.   Specialty:  Skilled Nursing Facility Contact information: [place] [phone]         No Known Allergies Allergies as of 03/04/2018   No Known Allergies     Medication List    TAKE these medications   acetaminophen 325 MG tablet Commonly known as:  TYLENOL Take 2 tablets (650 mg total) by mouth every 6 (six) hours as needed for mild pain, moderate pain or fever. What changed:  reasons to take this   ALPRAZolam 0.25 MG tablet Commonly known as:  XANAX Take 1 tablet (0.25 mg total) by mouth 2 (two) times daily as needed for anxiety.   Baclofen 5 MG Tabs Take 5 mg by mouth 2 (two) times daily.   CERTAVITE SENIOR/ANTIOXIDANT  Tabs Take 1 tablet by mouth daily.   collagenase ointment Commonly known as:  SANTYL Apply Santyl to left ischial, right ischial wound and right trocanteric wound twice daily and place new moist fluffed gauze to the area then cover with  and ABD pad and tape.   docusate sodium 100 MG capsule Commonly known as:  COLACE Take 1 capsule (100 mg total) by mouth daily.   ELIQUIS 2.5 MG Tabs tablet Generic drug:  apixaban Take 2.5 mg by mouth 2 (two) times daily.   ferrous sulfate 325 (65 FE) MG tablet Take 325 mg by mouth 2 (two) times daily with a meal.   gabapentin 100 MG capsule Commonly known as:  NEURONTIN Take 100 mg by mouth at bedtime.   magnesium hydroxide 400 MG/5ML suspension Commonly known as:  MILK OF MAGNESIA Take by mouth daily as needed for mild constipation.   metroNIDAZOLE 500 MG tablet Commonly known as:  FLAGYL Take 500 mg by mouth 2 (two) times daily.   piperacillin-tazobactam IVPB Commonly known as:  ZOSYN Inject 3.375 g into the vein every 6 (six) hours. 7 day course started 03/01/2018.   risperiDONE 0.5 MG tablet Commonly known as:  RISPERDAL Take 1 tablet (0.5 mg total) by mouth 2 (two) times daily.   senna-docusate 8.6-50 MG tablet Commonly known as:  Senokot-S Take 2 tablets by mouth 2 (two) times daily.   sodium phosphate 7-19 GM/118ML Enem Place 133 mLs (1 enema total) rectally daily as needed for severe constipation.   tiZANidine 4 MG tablet Commonly known as:  ZANAFLEX Take 1 tablet (4 mg total) by mouth every 6 (six) hours as needed for muscle spasms.   vancomycin IVPB Inject 1,000 mg into the vein every 12 (twelve) hours. 14 day course, started 03/01/2018.   vitamin C 500 MG tablet Commonly known as:  ASCORBIC ACID Take 500 mg by mouth 2 (two) times daily.       Procedures/Studies:  No results found.   Subjective:   Discharge Exam: Vitals:   03/03/18 2122 03/04/18 0549  BP: 132/85 (!) 139/95  Pulse: 65 72  Resp: 16 16   Temp: 98 F (36.7 C) 98.6 F (37 C)  SpO2: 100% 100%   Vitals:   03/03/18 1406 03/03/18 2122 03/04/18 0500 03/04/18 0549  BP: (!) 134/92 132/85  (!) 139/95  Pulse: 88 65  72  Resp: 18 16  16   Temp: 98.9  F (37.2 C) 98 F (36.7 C)  98.6 F (37 C)  TempSrc: Oral Oral  Oral  SpO2: 100% 100%  100%  Weight:   62 kg (136 lb 11 oz)     General: Pt is alert, awake, not in acute distress Cardiovascular: RRR, S1/S2 +, no rubs, no gallops Respiratory: CTA bilaterally, no wheezing, no rhonchi Abdominal: Soft, NT, ND, bowel sounds + Extremities: no edema, no cyanosis Wounds:        The results of significant diagnostics from this hospitalization (including imaging, microbiology, ancillary and laboratory) are listed below for reference.     Microbiology: No results found for this or any previous visit (from the past 240 hour(s)).   Labs: BNP (last 3 results) No results for input(s): BNP in the last 8760 hours. Basic Metabolic Panel: Recent Labs  Lab 03/03/18 0640 03/04/18 0610  NA 140 137  K 3.7 3.9  CL 107 104  CO2 27 TEST NOT PERFORMED, REAGENT NOT AVAILABLE  GLUCOSE 87 91  BUN 17 16  CREATININE 0.53* 0.64  CALCIUM 8.5* 8.6*   Liver Function Tests: No results for input(s): AST, ALT, ALKPHOS, BILITOT, PROT, ALBUMIN in the last 168 hours. No results for input(s): LIPASE, AMYLASE in the last 168 hours. No results for input(s): AMMONIA in the last 168 hours. CBC: Recent Labs  Lab 03/03/18 0640 03/04/18 0610  WBC 7.7  7.7 8.7  HGB 8.9*  9.0* 9.2*  HCT 30.4*  30.7* 32.3*  MCV 81.9  82.1 83.2  PLT 539*  541* 531*   Cardiac Enzymes: No results for input(s): CKTOTAL, CKMB, CKMBINDEX, TROPONINI in the last 168 hours. BNP: Invalid input(s): POCBNP CBG: Recent Labs  Lab 03/03/18 0738 03/04/18 0733  GLUCAP 90 94   D-Dimer No results for input(s): DDIMER in the last 72 hours. Hgb A1c No results for input(s): HGBA1C in the last 72 hours. Lipid  Profile No results for input(s): CHOL, HDL, LDLCALC, TRIG, CHOLHDL, LDLDIRECT in the last 72 hours. Thyroid function studies No results for input(s): TSH, T4TOTAL, T3FREE, THYROIDAB in the last 72 hours.  Invalid input(s): FREET3 Anemia work up Recent Labs    03/03/18 0640  VITAMINB12 215  FOLATE 11.1  FERRITIN 223  TIBC 171*  IRON 60  RETICCTPCT 2.2   Urinalysis    Component Value Date/Time   COLORURINE YELLOW 02/08/2018 1230   APPEARANCEUR CLOUDY (A) 02/08/2018 1230   LABSPEC 1.009 02/08/2018 1230   PHURINE 7.0 02/08/2018 1230   GLUCOSEU NEGATIVE 02/08/2018 1230   HGBUR MODERATE (A) 02/08/2018 1230   BILIRUBINUR NEGATIVE 02/08/2018 1230   KETONESUR NEGATIVE 02/08/2018 1230   PROTEINUR 30 (A) 02/08/2018 1230   NITRITE NEGATIVE 02/08/2018 1230   LEUKOCYTESUR LARGE (A) 02/08/2018 1230   Sepsis Labs Invalid input(s): PROCALCITONIN,  WBC,  LACTICIDVEN Microbiology No results found for this or any previous visit (from the past 240 hour(s)).  Time coordinating discharge:   SIGNED:  Irwin Brakeman, MD  [facility] 03/04/2018, 11:43 AM Pager [phone]  If 7PM-7AM, please contact night-coverage [email or web address] Password [email or web address]

## 2018-03-04 NOTE — Consult Note (Signed)
[facility] Consult  Reason for Consult: Ischial wounds  Referring Physician:  Dr. Roderic Oliver   Chief Complaint    Anemia      Dean Oliver is a 72 y.o. male.  HPI: Dean Oliver is a 72 yo who is quadriplegic, uses his arms some, and has known ischial pressure wounds and prior treatment for osteomyelitis from the SNF.  He came in from the SNF due to anemia and to obtain a PICC for his osteomyelitis treatment. The wound RNs looked at his picture on the chart per their documentation and recommended santyl to the wounds and surgery consult.    I am unsure how long he has had these wounds but they appear chronic in nature.  He is contractured and moves poorly in the bed.   Past Medical History:  Diagnosis Date  . Bradycardia   . Cataracts, bilateral   . Dysphagia   . HTN (hypertension)   . Pneumonia   . Quadriplegia ([place])   . Renal disorder   . Schizophrenia [facility]     Past Surgical History:  Procedure Laterality Date  . None      Family History  Family history unknown: Yes    Social History   Tobacco Use  . Smoking status: Never Smoker  . Smokeless tobacco: Never Used  Substance Use Topics  . Alcohol use: No    Frequency: Never  . Drug use: No    Medications:  I have reviewed the patient's current medications. Prior to Admission:  Medications Prior to Admission  Medication Sig Dispense Refill Last Dose  . acetaminophen (TYLENOL) 325 MG tablet Take 2 tablets (650 mg total) by mouth every 6 (six) hours as needed for mild pain, moderate pain or fever. (Patient taking differently: Take 650 mg by mouth every 6 (six) hours as needed for mild pain. )   unknown  . ALPRAZolam (XANAX) 0.25 MG tablet Take 1 tablet (0.25 mg total) by mouth 2 (two) times daily as needed for anxiety. 10 tablet 0 02/08/2018 at Unknown time  . apixaban (ELIQUIS) 2.5 MG TABS tablet Take 2.5 mg by mouth 2 (two) times daily.   03/01/2018 at 1700  . Baclofen 5 MG TABS Take 5 mg by mouth 2  (two) times daily. 10 tablet 0 12/11/2017 at Unknown time  . ferrous sulfate 325 (65 FE) MG tablet Take 325 mg by mouth 2 (two) times daily with a meal.    02/08/2018 at Unknown time  . gabapentin (NEURONTIN) 100 MG capsule Take 100 mg by mouth at bedtime.   02/07/2018 at Unknown time  . magnesium hydroxide (MILK OF MAGNESIA) 400 MG/5ML suspension Take by mouth daily as needed for mild constipation.     . metroNIDAZOLE (FLAGYL) 500 MG tablet Take 500 mg by mouth 2 (two) times daily.     . piperacillin-tazobactam (ZOSYN) IVPB Inject 3.375 g into the vein every 6 (six) hours. 7 day course started 03/01/2018.   03/02/2018 at Unknown time  . risperiDONE (RISPERDAL) 0.5 MG tablet Take 1 tablet (0.5 mg total) by mouth 2 (two) times daily. 10 tablet 0 02/08/2018 at Unknown time  . senna-docusate (SENOKOT-S) 8.6-50 MG tablet Take 2 tablets by mouth 2 (two) times daily.   02/08/2018 at Unknown time  . sodium phosphate (FLEET) 7-19 GM/118ML ENEM Place 133 mLs (1 enema total) rectally daily as needed for severe constipation.  0 02/07/2018 at Unknown time  . tiZANidine (ZANAFLEX) 4 MG tablet Take 1 tablet (4 mg total)  by mouth every 6 (six) hours as needed for muscle spasms. 10 tablet 0 02/08/2018 at Unknown time  . vancomycin IVPB Inject 1,000 mg into the vein every 12 (twelve) hours. 14 day course, started 03/01/2018.     . vitamin C (ASCORBIC ACID) 500 MG tablet Take 500 mg by mouth 2 (two) times daily.    02/08/2018 at Unknown time   Scheduled: . apixaban  2.5 mg Oral Q24H  . baclofen  5 mg Oral BID  . collagenase   Topical Daily  . docusate sodium  100 mg Oral Daily  . gabapentin  100 mg Oral QHS  . multivitamin with minerals  1 tablet Oral Daily  . risperiDONE  0.5 mg Oral BID  . senna-docusate  2 tablet Oral BID  . sodium chloride flush  10-40 mL Intracatheter Q12H  . vitamin C  500 mg Oral BID   Continuous: . sodium chloride    . piperacillin-tazobactam (ZOSYN)  IV 3.375 g (03/04/18 0901)    YBO:FBPZWCHENIDPO **OR** acetaminophen, ALPRAZolam, HYDROcodone-acetaminophen, ondansetron **OR** ondansetron (ZOFRAN) IV, sodium chloride flush, sodium phosphate, tiZANidine  No Known Allergies  ROS:  A comprehensive review of systems was negative except for: Hematologic/lymphatic: positive for anemia Musculoskeletal: positive for ischial wounds  Blood pressure (!) 139/95, pulse 72, temperature 98.6 F (37 C), temperature source Oral, resp. rate 16, weight 136 lb 11 oz (62 kg), SpO2 100 %. Physical Exam  Constitutional: He appears cachectic. No distress.  HENT:  Head: Normocephalic.  Temporal wasting  Eyes: Pupils are equal, round, and reactive to light.  Cardiovascular: Normal rate.  Pulmonary/Chest: Effort normal.  Abdominal: Soft. He exhibits no distension. There is no tenderness.  Musculoskeletal: He exhibits edema and tenderness.  Contractures; right ischial pressure ulcer with some fibrinous material but no obvious exposed bone Stage IV; about 8X3cmX2cm deep; right trocanteric pressure ulcer with some superficial fibrinous material, about 2X2X2cm; and left ischial pressure wound about 10X3X3cm, less fibrinous material at base, bases of all wounds firm and not boggy  Neurological: He is alert.  Skin: Skin is warm and dry.  Psychiatric: He has a normal mood and affect. His behavior is normal.  Vitals reviewed. Right ischial wound:  Right trochanteric wound:  Left ischial wound:     Results: Results for orders placed or performed during the hospital encounter of 03/02/18 (from the past 48 hour(s))  Prepare RBC     Status: None   Collection Time: 03/02/18  3:10 PM  Result Value Ref Range   Order Confirmation      ORDER PROCESSED BY BLOOD BANK Performed at [facility], [address], [place], [place] [address]   Type and screen Ordered by PROVIDER DEFAULT     Status: None   Collection Time: 03/02/18  3:10 PM  Result Value Ref Range   ABO/RH(D) A POS    Antibody  Screen NEG    Sample Expiration 03/05/2018    Unit Number [id number]    Blood Component Type RED CELLS,LR    Unit division 00    Status of Unit ISSUED,FINAL    Transfusion Status OK TO TRANSFUSE    Crossmatch Result      Compatible Performed at [facility], [address], [place], [place] [address]    Unit Number [id number]    Blood Component Type RED CELLS,LR    Unit division 00    Status of Unit ISSUED,FINAL    Transfusion Status OK TO TRANSFUSE    Crossmatch Result Compatible  ABO/Rh     Status: None   Collection Time: 03/02/18  3:10 PM  Result Value Ref Range   ABO/RH(D)      A POS Performed at [facility], [address], [place], [place] [address]   Basic metabolic panel     Status: Abnormal   Collection Time: 03/03/18  6:40 AM  Result Value Ref Range   Sodium 140 135 - 145 mmol/L   Potassium 3.7 3.5 - 5.1 mmol/L   Chloride 107 101 - 111 mmol/L   CO2 27 22 - 32 mmol/L   Glucose, Bld 87 65 - 99 mg/dL   BUN 17 6 - 20 mg/dL   Creatinine, Ser 0.53 (L) 0.61 - 1.24 mg/dL   Calcium 8.5 (L) 8.9 - 10.3 mg/dL   GFR calc non Af Amer >60 >60 mL/min   GFR calc Af Amer >60 >60 mL/min    Comment: (NOTE) The eGFR has been calculated using the CKD EPI equation. This calculation has not been validated in all clinical situations. eGFR's persistently <60 mL/min signify possible Chronic Kidney Disease.    Anion gap 6 5 - 15    Comment: Performed at [facility], [address] [place], [place] [address]  CBC     Status: Abnormal   Collection Time: 03/03/18  6:40 AM  Result Value Ref Range   WBC 7.7 4.0 - 10.5 K/uL   RBC 3.74 (L) 4.22 - 5.81 MIL/uL   Hemoglobin 9.0 (L) 13.0 - 17.0 g/dL   HCT 30.7 (L) 39.0 - 52.0 %   MCV 82.1 78.0 - 100.0 fL   MCH 24.1 (L) 26.0 - 34.0 pg   MCHC 29.3 (L) 30.0 - 36.0 g/dL   RDW 17.7 (H) 11.5 - 15.5 %   Platelets 541 (H) 150 - 400 K/uL    Comment: Performed at [facility], [address] [place], [place] [address]  CBC     Status:  Abnormal   Collection Time: 03/03/18  6:40 AM  Result Value Ref Range   WBC 7.7 4.0 - 10.5 K/uL   RBC 3.71 (L) 4.22 - 5.81 MIL/uL   Hemoglobin 8.9 (L) 13.0 - 17.0 g/dL   HCT 30.4 (L) 39.0 - 52.0 %   MCV 81.9 78.0 - 100.0 fL   MCH 24.0 (L) 26.0 - 34.0 pg   MCHC 29.3 (L) 30.0 - 36.0 g/dL   RDW 17.7 (H) 11.5 - 15.5 %   Platelets 539 (H) 150 - 400 K/uL    Comment: Performed at [facility], [address] [place], [place] [address]  Ferritin     Status: None   Collection Time: 03/03/18  6:40 AM  Result Value Ref Range   Ferritin 223 24 - 336 ng/mL    Comment: Performed at [facility], [place] [address] [place], [place] [address]  Folate     Status: None   Collection Time: 03/03/18  6:40 AM  Result Value Ref Range   Folate 11.1 >5.9 ng/mL    Comment: Performed at [facility] [address], [place], [place] [address]  Iron and TIBC     Status: Abnormal   Collection Time: 03/03/18  6:40 AM  Result Value Ref Range   Iron 60 45 - 182 ug/dL   TIBC 171 (L) 250 - 450 ug/dL   Saturation Ratios 35 17.9 - 39.5 %   UIBC 111 ug/dL    Comment: Performed at [facility], [place],  [place] [address]  Reticulocytes     Status: Abnormal   Collection Time: 03/03/18  6:40 AM  Result Value Ref Range   Retic Ct Pct 2.2 0.4 - 3.1 %   RBC. 3.71 (L) 4.22 - 5.81 MIL/uL   Retic Count, Absolute 81.6 19.0 - 186.0 K/uL    Comment: Performed at [facility], [address], [place], [place] [address]  Vitamin B12     Status: None   Collection Time: 03/03/18  6:40 AM  Result Value Ref Range   Vitamin B-12 215 180 - 914 pg/mL    Comment: (NOTE) This assay is not validated for testing neonatal or myeloproliferative syndrome specimens for Vitamin B12 levels. Performed at [facility], [place] [address] [place], [place] [address]   Glucose, capillary     Status: None   Collection Time: 03/03/18  7:38 AM  Result Value Ref Range   Glucose-Capillary 90 65 - 99 mg/dL    CBC     Status: Abnormal   Collection Time: 03/04/18  6:10 AM  Result Value Ref Range   WBC 8.7 4.0 - 10.5 K/uL   RBC 3.88 (L) 4.22 - 5.81 MIL/uL   Hemoglobin 9.2 (L) 13.0 - 17.0 g/dL   HCT 32.3 (L) 39.0 - 52.0 %   MCV 83.2 78.0 - 100.0 fL   MCH 23.7 (L) 26.0 - 34.0 pg   MCHC 28.5 (L) 30.0 - 36.0 g/dL   RDW 18.3 (H) 11.5 - 15.5 %   Platelets 531 (H) 150 - 400 K/uL    Comment: Performed at [facility], [address], [place], [place] [address]  Basic metabolic panel     Status: Abnormal   Collection Time: 03/04/18  6:10 AM  Result Value Ref Range   Sodium 137 135 - 145 mmol/L   Potassium 3.9 3.5 - 5.1 mmol/L   Chloride 104 101 - 111 mmol/L   CO2 TEST NOT PERFORMED, REAGENT NOT AVAILABLE 22 - 32 mmol/L   Glucose, Bld 91 65 - 99 mg/dL   BUN 16 6 - 20 mg/dL   Creatinine, Ser 0.64 0.61 - 1.24 mg/dL   Calcium 8.6 (L) 8.9 - 10.3 mg/dL   GFR calc non Af Amer >60 >60 mL/min   GFR calc Af Amer >60 >60 mL/min    Comment: (NOTE) The eGFR has been calculated using the CKD EPI equation. This calculation has not been validated in all clinical situations. eGFR's persistently <60 mL/min signify possible Chronic Kidney Disease. Performed at [facility], [address], [place], [place] [address]   Glucose, capillary     Status: None   Collection Time: 03/04/18  7:33 AM  Result Value Ref Range   Glucose-Capillary 94 65 - 99 mg/dL      Assessment & Plan:  TESLA BOCHICCHIO is a 72 y.o. male with stage IV pressure ulcers of the right and left ischium and right trochanteric area. There is not obvious exposed bone and only superficial fibrinous material, bases are firm and not boggy.  He is very cachetic and frail with contractures. -Frequent turns, air soft bed for pressure sores  -Protein nutrition, supplementation  -Agree with Santyl but would do on all wounds and do twice a day, orders placed   Apply Santyl to left ischial, right ischial wound and right trocanteric wound twice daily and  place new moist fluffed gauze to the area then cover with  and ABD pad and tape  Does not need surgical debridement. Can follow  up with SNF/ PCP.   Virl Cagey 03/04/2018, 11:10 AM

## 2018-03-04 NOTE — Progress Notes (Signed)
Report called to [place] [facility], Linna Hoff.  To go with PICC per Dr. Wynetta Emery

## 2018-03-04 NOTE — Progress Notes (Signed)
Dressings changed to the one left hip and two right hip decubitus sites with santyl applied to wound base.

## 2018-03-04 NOTE — Discharge Instructions (Signed)
Apply Santyl to left ischial, right ischial wound and right trocanteric wound twice daily and place new moist fluffed gauze to the area then cover with an ABD pad and tape  Air mattress recommended.    Follow with Primary MD  Caprice Renshaw, MD  and other consultant's as instructed your Hospitalist MD  Please get a complete blood count and chemistry panel checked by your Primary MD at your next visit, and again as instructed by your Primary MD.  Get Medicines reviewed and adjusted: Please take all your medications with you for your next visit with your Primary MD  Laboratory/radiological data: Please request your Primary MD to go over all hospital tests and procedure/radiological results at the follow up, please ask your Primary MD to get all Hospital records sent to his/her office.  In some cases, they will be blood work, cultures and biopsy results pending at the time of your discharge. Please request that your primary care M.D. follows up on these results.  Also Note the following: If you experience worsening of your admission symptoms, develop shortness of breath, life threatening emergency, suicidal or homicidal thoughts you must seek medical attention immediately by calling 911 or calling your MD immediately  if symptoms less severe.  You must read complete instructions/literature along with all the possible adverse reactions/side effects for all the Medicines you take and that have been prescribed to you. Take any new Medicines after you have completely understood and accpet all the possible adverse reactions/side effects.   Do not drive when taking Pain medications or sleeping medications (Benzodaizepines)  Do not take more than prescribed Pain, Sleep and Anxiety Medications. It is not advisable to combine anxiety,sleep and pain medications without talking with your primary care practitioner  Special Instructions: If you have smoked or chewed Tobacco  in the last 2 yrs please stop  smoking, stop any regular Alcohol  and or any Recreational drug use.  Wear Seat belts while driving.  Please note: You were cared for by a hospitalist during your hospital stay. Once you are discharged, your primary care physician will handle any further medical issues. Please note that NO REFILLS for any discharge medications will be authorized once you are discharged, as it is imperative that you return to your primary care physician (or establish a relationship with a primary care physician if you do not have one) for your post hospital discharge needs so that they can reassess your need for medications and monitor your lab values.

## 2018-03-04 NOTE — Progress Notes (Signed)
Patient will Discharge To: [facility] Date:03/04/18 Family Notified: yes, [name] [phone] Transport [id number]   Per MD patient ready for DC to [facility] . RN, patient, patient's family, and facility notified of DC. Assessment, Fl2/Pasrr, and Discharge Summary sent to facility. RN given number for report [phone] Patient will be returning to room C10 Bed 3). DC packet on chart. Ambulance transport requested for patient.   CSW signing off.  Reed Breech LCSWA [phone]

## 2018-03-06 ENCOUNTER — Encounter (HOSPITAL_COMMUNITY): Payer: Medicare Other

## 2018-03-06 DIAGNOSIS — M869 Osteomyelitis, unspecified: Secondary | ICD-10-CM | POA: Diagnosis not present

## 2018-03-06 DIAGNOSIS — D649 Anemia, unspecified: Secondary | ICD-10-CM | POA: Diagnosis not present

## 2018-03-07 DIAGNOSIS — H401134 Primary open-angle glaucoma, bilateral, indeterminate stage: Secondary | ICD-10-CM | POA: Diagnosis not present

## 2018-03-07 DIAGNOSIS — Z7901 Long term (current) use of anticoagulants: Secondary | ICD-10-CM | POA: Diagnosis not present

## 2018-03-07 DIAGNOSIS — Z961 Presence of intraocular lens: Secondary | ICD-10-CM | POA: Diagnosis not present

## 2018-03-07 DIAGNOSIS — H2512 Age-related nuclear cataract, left eye: Secondary | ICD-10-CM | POA: Diagnosis not present

## 2018-03-07 DIAGNOSIS — L97829 Non-pressure chronic ulcer of other part of left lower leg with unspecified severity: Secondary | ICD-10-CM | POA: Diagnosis not present

## 2018-03-07 DIAGNOSIS — L89214 Pressure ulcer of right hip, stage 4: Secondary | ICD-10-CM | POA: Diagnosis not present

## 2018-03-07 DIAGNOSIS — L89143 Pressure ulcer of left lower back, stage 3: Secondary | ICD-10-CM | POA: Diagnosis not present

## 2018-03-08 DIAGNOSIS — G8250 Quadriplegia, unspecified: Secondary | ICD-10-CM | POA: Diagnosis not present

## 2018-03-08 DIAGNOSIS — M6281 Muscle weakness (generalized): Secondary | ICD-10-CM | POA: Diagnosis not present

## 2018-03-08 DIAGNOSIS — S14122D Central cord syndrome at C2 level of cervical spinal cord, subsequent encounter: Secondary | ICD-10-CM | POA: Diagnosis not present

## 2018-03-08 DIAGNOSIS — Z5181 Encounter for therapeutic drug level monitoring: Secondary | ICD-10-CM | POA: Diagnosis not present

## 2018-03-08 DIAGNOSIS — R7989 Other specified abnormal findings of blood chemistry: Secondary | ICD-10-CM | POA: Diagnosis not present

## 2018-03-08 DIAGNOSIS — D649 Anemia, unspecified: Secondary | ICD-10-CM | POA: Diagnosis not present

## 2018-03-08 DIAGNOSIS — R634 Abnormal weight loss: Secondary | ICD-10-CM | POA: Diagnosis not present

## 2018-03-08 DIAGNOSIS — R279 Unspecified lack of coordination: Secondary | ICD-10-CM | POA: Diagnosis not present

## 2018-03-09 DIAGNOSIS — M6281 Muscle weakness (generalized): Secondary | ICD-10-CM | POA: Diagnosis not present

## 2018-03-09 DIAGNOSIS — G8250 Quadriplegia, unspecified: Secondary | ICD-10-CM | POA: Diagnosis not present

## 2018-03-09 DIAGNOSIS — S14122D Central cord syndrome at C2 level of cervical spinal cord, subsequent encounter: Secondary | ICD-10-CM | POA: Diagnosis not present

## 2018-03-09 DIAGNOSIS — R279 Unspecified lack of coordination: Secondary | ICD-10-CM | POA: Diagnosis not present

## 2018-03-10 DIAGNOSIS — G8250 Quadriplegia, unspecified: Secondary | ICD-10-CM | POA: Diagnosis not present

## 2018-03-10 DIAGNOSIS — R279 Unspecified lack of coordination: Secondary | ICD-10-CM | POA: Diagnosis not present

## 2018-03-10 DIAGNOSIS — S14122D Central cord syndrome at C2 level of cervical spinal cord, subsequent encounter: Secondary | ICD-10-CM | POA: Diagnosis not present

## 2018-03-10 DIAGNOSIS — M6281 Muscle weakness (generalized): Secondary | ICD-10-CM | POA: Diagnosis not present

## 2018-03-13 DIAGNOSIS — S14122D Central cord syndrome at C2 level of cervical spinal cord, subsequent encounter: Secondary | ICD-10-CM | POA: Diagnosis not present

## 2018-03-13 DIAGNOSIS — M6281 Muscle weakness (generalized): Secondary | ICD-10-CM | POA: Diagnosis not present

## 2018-03-13 DIAGNOSIS — G8250 Quadriplegia, unspecified: Secondary | ICD-10-CM | POA: Diagnosis not present

## 2018-03-13 DIAGNOSIS — R279 Unspecified lack of coordination: Secondary | ICD-10-CM | POA: Diagnosis not present

## 2018-03-14 DIAGNOSIS — L97829 Non-pressure chronic ulcer of other part of left lower leg with unspecified severity: Secondary | ICD-10-CM | POA: Diagnosis not present

## 2018-03-14 DIAGNOSIS — L89214 Pressure ulcer of right hip, stage 4: Secondary | ICD-10-CM | POA: Diagnosis not present

## 2018-03-14 DIAGNOSIS — S14122D Central cord syndrome at C2 level of cervical spinal cord, subsequent encounter: Secondary | ICD-10-CM | POA: Diagnosis not present

## 2018-03-14 DIAGNOSIS — L89143 Pressure ulcer of left lower back, stage 3: Secondary | ICD-10-CM | POA: Diagnosis not present

## 2018-03-14 DIAGNOSIS — R279 Unspecified lack of coordination: Secondary | ICD-10-CM | POA: Diagnosis not present

## 2018-03-14 DIAGNOSIS — G8250 Quadriplegia, unspecified: Secondary | ICD-10-CM | POA: Diagnosis not present

## 2018-03-14 DIAGNOSIS — M6281 Muscle weakness (generalized): Secondary | ICD-10-CM | POA: Diagnosis not present

## 2018-03-15 DIAGNOSIS — S14122D Central cord syndrome at C2 level of cervical spinal cord, subsequent encounter: Secondary | ICD-10-CM | POA: Diagnosis not present

## 2018-03-15 DIAGNOSIS — M6281 Muscle weakness (generalized): Secondary | ICD-10-CM | POA: Diagnosis not present

## 2018-03-15 DIAGNOSIS — G8250 Quadriplegia, unspecified: Secondary | ICD-10-CM | POA: Diagnosis not present

## 2018-03-15 DIAGNOSIS — R279 Unspecified lack of coordination: Secondary | ICD-10-CM | POA: Diagnosis not present

## 2018-03-16 DIAGNOSIS — R279 Unspecified lack of coordination: Secondary | ICD-10-CM | POA: Diagnosis not present

## 2018-03-16 DIAGNOSIS — M6281 Muscle weakness (generalized): Secondary | ICD-10-CM | POA: Diagnosis not present

## 2018-03-16 DIAGNOSIS — S14122D Central cord syndrome at C2 level of cervical spinal cord, subsequent encounter: Secondary | ICD-10-CM | POA: Diagnosis not present

## 2018-03-16 DIAGNOSIS — G8250 Quadriplegia, unspecified: Secondary | ICD-10-CM | POA: Diagnosis not present

## 2018-03-17 DIAGNOSIS — Z79899 Other long term (current) drug therapy: Secondary | ICD-10-CM | POA: Diagnosis not present

## 2018-03-17 DIAGNOSIS — R279 Unspecified lack of coordination: Secondary | ICD-10-CM | POA: Diagnosis not present

## 2018-03-17 DIAGNOSIS — S14122D Central cord syndrome at C2 level of cervical spinal cord, subsequent encounter: Secondary | ICD-10-CM | POA: Diagnosis not present

## 2018-03-17 DIAGNOSIS — M6281 Muscle weakness (generalized): Secondary | ICD-10-CM | POA: Diagnosis not present

## 2018-03-17 DIAGNOSIS — G8250 Quadriplegia, unspecified: Secondary | ICD-10-CM | POA: Diagnosis not present

## 2018-03-19 DIAGNOSIS — Z79899 Other long term (current) drug therapy: Secondary | ICD-10-CM | POA: Diagnosis not present

## 2018-03-20 DIAGNOSIS — Z79899 Other long term (current) drug therapy: Secondary | ICD-10-CM | POA: Diagnosis not present

## 2018-03-21 DIAGNOSIS — L89214 Pressure ulcer of right hip, stage 4: Secondary | ICD-10-CM | POA: Diagnosis not present

## 2018-03-21 DIAGNOSIS — L89134 Pressure ulcer of right lower back, stage 4: Secondary | ICD-10-CM | POA: Diagnosis not present

## 2018-03-21 DIAGNOSIS — L89143 Pressure ulcer of left lower back, stage 3: Secondary | ICD-10-CM | POA: Diagnosis not present

## 2018-03-22 DIAGNOSIS — R279 Unspecified lack of coordination: Secondary | ICD-10-CM | POA: Diagnosis not present

## 2018-03-22 DIAGNOSIS — R1312 Dysphagia, oropharyngeal phase: Secondary | ICD-10-CM | POA: Diagnosis not present

## 2018-03-22 DIAGNOSIS — R41841 Cognitive communication deficit: Secondary | ICD-10-CM | POA: Diagnosis not present

## 2018-03-22 DIAGNOSIS — M6281 Muscle weakness (generalized): Secondary | ICD-10-CM | POA: Diagnosis not present

## 2018-03-22 DIAGNOSIS — G8250 Quadriplegia, unspecified: Secondary | ICD-10-CM | POA: Diagnosis not present

## 2018-03-22 DIAGNOSIS — S14122D Central cord syndrome at C2 level of cervical spinal cord, subsequent encounter: Secondary | ICD-10-CM | POA: Diagnosis not present

## 2018-03-27 DIAGNOSIS — R1312 Dysphagia, oropharyngeal phase: Secondary | ICD-10-CM | POA: Diagnosis not present

## 2018-03-27 DIAGNOSIS — G8250 Quadriplegia, unspecified: Secondary | ICD-10-CM | POA: Diagnosis not present

## 2018-03-27 DIAGNOSIS — S14122D Central cord syndrome at C2 level of cervical spinal cord, subsequent encounter: Secondary | ICD-10-CM | POA: Diagnosis not present

## 2018-03-27 DIAGNOSIS — R279 Unspecified lack of coordination: Secondary | ICD-10-CM | POA: Diagnosis not present

## 2018-03-27 DIAGNOSIS — R41841 Cognitive communication deficit: Secondary | ICD-10-CM | POA: Diagnosis not present

## 2018-03-27 DIAGNOSIS — M6281 Muscle weakness (generalized): Secondary | ICD-10-CM | POA: Diagnosis not present

## 2018-03-28 DIAGNOSIS — R1312 Dysphagia, oropharyngeal phase: Secondary | ICD-10-CM | POA: Diagnosis not present

## 2018-03-28 DIAGNOSIS — R41841 Cognitive communication deficit: Secondary | ICD-10-CM | POA: Diagnosis not present

## 2018-03-28 DIAGNOSIS — G8250 Quadriplegia, unspecified: Secondary | ICD-10-CM | POA: Diagnosis not present

## 2018-03-28 DIAGNOSIS — S14122D Central cord syndrome at C2 level of cervical spinal cord, subsequent encounter: Secondary | ICD-10-CM | POA: Diagnosis not present

## 2018-03-28 DIAGNOSIS — M6281 Muscle weakness (generalized): Secondary | ICD-10-CM | POA: Diagnosis not present

## 2018-03-28 DIAGNOSIS — L89134 Pressure ulcer of right lower back, stage 4: Secondary | ICD-10-CM | POA: Diagnosis not present

## 2018-03-28 DIAGNOSIS — L89143 Pressure ulcer of left lower back, stage 3: Secondary | ICD-10-CM | POA: Diagnosis not present

## 2018-03-28 DIAGNOSIS — R279 Unspecified lack of coordination: Secondary | ICD-10-CM | POA: Diagnosis not present

## 2018-03-28 DIAGNOSIS — L89214 Pressure ulcer of right hip, stage 4: Secondary | ICD-10-CM | POA: Diagnosis not present

## 2018-03-29 DIAGNOSIS — R279 Unspecified lack of coordination: Secondary | ICD-10-CM | POA: Diagnosis not present

## 2018-03-29 DIAGNOSIS — S14122D Central cord syndrome at C2 level of cervical spinal cord, subsequent encounter: Secondary | ICD-10-CM | POA: Diagnosis not present

## 2018-03-29 DIAGNOSIS — M6281 Muscle weakness (generalized): Secondary | ICD-10-CM | POA: Diagnosis not present

## 2018-03-29 DIAGNOSIS — R1312 Dysphagia, oropharyngeal phase: Secondary | ICD-10-CM | POA: Diagnosis not present

## 2018-03-29 DIAGNOSIS — R41841 Cognitive communication deficit: Secondary | ICD-10-CM | POA: Diagnosis not present

## 2018-03-29 DIAGNOSIS — G8250 Quadriplegia, unspecified: Secondary | ICD-10-CM | POA: Diagnosis not present

## 2018-03-29 DIAGNOSIS — M86171 Other acute osteomyelitis, right ankle and foot: Secondary | ICD-10-CM | POA: Diagnosis not present

## 2018-03-30 DIAGNOSIS — S14122D Central cord syndrome at C2 level of cervical spinal cord, subsequent encounter: Secondary | ICD-10-CM | POA: Diagnosis not present

## 2018-03-30 DIAGNOSIS — M6281 Muscle weakness (generalized): Secondary | ICD-10-CM | POA: Diagnosis not present

## 2018-03-30 DIAGNOSIS — G8250 Quadriplegia, unspecified: Secondary | ICD-10-CM | POA: Diagnosis not present

## 2018-03-30 DIAGNOSIS — R1312 Dysphagia, oropharyngeal phase: Secondary | ICD-10-CM | POA: Diagnosis not present

## 2018-03-30 DIAGNOSIS — R279 Unspecified lack of coordination: Secondary | ICD-10-CM | POA: Diagnosis not present

## 2018-03-30 DIAGNOSIS — R41841 Cognitive communication deficit: Secondary | ICD-10-CM | POA: Diagnosis not present

## 2018-03-31 DIAGNOSIS — M6281 Muscle weakness (generalized): Secondary | ICD-10-CM | POA: Diagnosis not present

## 2018-03-31 DIAGNOSIS — R41841 Cognitive communication deficit: Secondary | ICD-10-CM | POA: Diagnosis not present

## 2018-03-31 DIAGNOSIS — S14122D Central cord syndrome at C2 level of cervical spinal cord, subsequent encounter: Secondary | ICD-10-CM | POA: Diagnosis not present

## 2018-03-31 DIAGNOSIS — G8250 Quadriplegia, unspecified: Secondary | ICD-10-CM | POA: Diagnosis not present

## 2018-03-31 DIAGNOSIS — R1312 Dysphagia, oropharyngeal phase: Secondary | ICD-10-CM | POA: Diagnosis not present

## 2018-03-31 DIAGNOSIS — R279 Unspecified lack of coordination: Secondary | ICD-10-CM | POA: Diagnosis not present

## 2018-04-03 DIAGNOSIS — R1312 Dysphagia, oropharyngeal phase: Secondary | ICD-10-CM | POA: Diagnosis not present

## 2018-04-03 DIAGNOSIS — G8250 Quadriplegia, unspecified: Secondary | ICD-10-CM | POA: Diagnosis not present

## 2018-04-03 DIAGNOSIS — R41841 Cognitive communication deficit: Secondary | ICD-10-CM | POA: Diagnosis not present

## 2018-04-03 DIAGNOSIS — S14122D Central cord syndrome at C2 level of cervical spinal cord, subsequent encounter: Secondary | ICD-10-CM | POA: Diagnosis not present

## 2018-04-03 DIAGNOSIS — R279 Unspecified lack of coordination: Secondary | ICD-10-CM | POA: Diagnosis not present

## 2018-04-03 DIAGNOSIS — M6281 Muscle weakness (generalized): Secondary | ICD-10-CM | POA: Diagnosis not present

## 2018-04-04 DIAGNOSIS — G8250 Quadriplegia, unspecified: Secondary | ICD-10-CM | POA: Diagnosis not present

## 2018-04-04 DIAGNOSIS — R279 Unspecified lack of coordination: Secondary | ICD-10-CM | POA: Diagnosis not present

## 2018-04-04 DIAGNOSIS — L89143 Pressure ulcer of left lower back, stage 3: Secondary | ICD-10-CM | POA: Diagnosis not present

## 2018-04-04 DIAGNOSIS — R41841 Cognitive communication deficit: Secondary | ICD-10-CM | POA: Diagnosis not present

## 2018-04-04 DIAGNOSIS — L89214 Pressure ulcer of right hip, stage 4: Secondary | ICD-10-CM | POA: Diagnosis not present

## 2018-04-04 DIAGNOSIS — M6281 Muscle weakness (generalized): Secondary | ICD-10-CM | POA: Diagnosis not present

## 2018-04-04 DIAGNOSIS — S14122D Central cord syndrome at C2 level of cervical spinal cord, subsequent encounter: Secondary | ICD-10-CM | POA: Diagnosis not present

## 2018-04-04 DIAGNOSIS — R1312 Dysphagia, oropharyngeal phase: Secondary | ICD-10-CM | POA: Diagnosis not present

## 2018-04-04 DIAGNOSIS — L89134 Pressure ulcer of right lower back, stage 4: Secondary | ICD-10-CM | POA: Diagnosis not present

## 2018-04-05 DIAGNOSIS — R6889 Other general symptoms and signs: Secondary | ICD-10-CM | POA: Diagnosis not present

## 2018-04-05 DIAGNOSIS — R319 Hematuria, unspecified: Secondary | ICD-10-CM | POA: Diagnosis not present

## 2018-04-05 DIAGNOSIS — R509 Fever, unspecified: Secondary | ICD-10-CM | POA: Diagnosis not present

## 2018-04-05 DIAGNOSIS — R279 Unspecified lack of coordination: Secondary | ICD-10-CM | POA: Diagnosis not present

## 2018-04-05 DIAGNOSIS — M6281 Muscle weakness (generalized): Secondary | ICD-10-CM | POA: Diagnosis not present

## 2018-04-05 DIAGNOSIS — G8250 Quadriplegia, unspecified: Secondary | ICD-10-CM | POA: Diagnosis not present

## 2018-04-05 DIAGNOSIS — N390 Urinary tract infection, site not specified: Secondary | ICD-10-CM | POA: Diagnosis not present

## 2018-04-05 DIAGNOSIS — R41841 Cognitive communication deficit: Secondary | ICD-10-CM | POA: Diagnosis not present

## 2018-04-05 DIAGNOSIS — R1312 Dysphagia, oropharyngeal phase: Secondary | ICD-10-CM | POA: Diagnosis not present

## 2018-04-05 DIAGNOSIS — S14122D Central cord syndrome at C2 level of cervical spinal cord, subsequent encounter: Secondary | ICD-10-CM | POA: Diagnosis not present

## 2018-04-06 DIAGNOSIS — R279 Unspecified lack of coordination: Secondary | ICD-10-CM | POA: Diagnosis not present

## 2018-04-06 DIAGNOSIS — G8250 Quadriplegia, unspecified: Secondary | ICD-10-CM | POA: Diagnosis not present

## 2018-04-06 DIAGNOSIS — Z960 Presence of urogenital implants: Secondary | ICD-10-CM | POA: Diagnosis not present

## 2018-04-06 DIAGNOSIS — M6281 Muscle weakness (generalized): Secondary | ICD-10-CM | POA: Diagnosis not present

## 2018-04-06 DIAGNOSIS — S14122D Central cord syndrome at C2 level of cervical spinal cord, subsequent encounter: Secondary | ICD-10-CM | POA: Diagnosis not present

## 2018-04-06 DIAGNOSIS — M86159 Other acute osteomyelitis, unspecified femur: Secondary | ICD-10-CM | POA: Diagnosis not present

## 2018-04-06 DIAGNOSIS — R1312 Dysphagia, oropharyngeal phase: Secondary | ICD-10-CM | POA: Diagnosis not present

## 2018-04-06 DIAGNOSIS — R41841 Cognitive communication deficit: Secondary | ICD-10-CM | POA: Diagnosis not present

## 2018-04-07 DIAGNOSIS — M6281 Muscle weakness (generalized): Secondary | ICD-10-CM | POA: Diagnosis not present

## 2018-04-07 DIAGNOSIS — R41841 Cognitive communication deficit: Secondary | ICD-10-CM | POA: Diagnosis not present

## 2018-04-07 DIAGNOSIS — R1312 Dysphagia, oropharyngeal phase: Secondary | ICD-10-CM | POA: Diagnosis not present

## 2018-04-07 DIAGNOSIS — G8250 Quadriplegia, unspecified: Secondary | ICD-10-CM | POA: Diagnosis not present

## 2018-04-07 DIAGNOSIS — R279 Unspecified lack of coordination: Secondary | ICD-10-CM | POA: Diagnosis not present

## 2018-04-07 DIAGNOSIS — S14122D Central cord syndrome at C2 level of cervical spinal cord, subsequent encounter: Secondary | ICD-10-CM | POA: Diagnosis not present

## 2018-04-11 DIAGNOSIS — R279 Unspecified lack of coordination: Secondary | ICD-10-CM | POA: Diagnosis not present

## 2018-04-11 DIAGNOSIS — R41841 Cognitive communication deficit: Secondary | ICD-10-CM | POA: Diagnosis not present

## 2018-04-11 DIAGNOSIS — L89134 Pressure ulcer of right lower back, stage 4: Secondary | ICD-10-CM | POA: Diagnosis not present

## 2018-04-11 DIAGNOSIS — L89143 Pressure ulcer of left lower back, stage 3: Secondary | ICD-10-CM | POA: Diagnosis not present

## 2018-04-11 DIAGNOSIS — S14122D Central cord syndrome at C2 level of cervical spinal cord, subsequent encounter: Secondary | ICD-10-CM | POA: Diagnosis not present

## 2018-04-11 DIAGNOSIS — M6281 Muscle weakness (generalized): Secondary | ICD-10-CM | POA: Diagnosis not present

## 2018-04-11 DIAGNOSIS — G8250 Quadriplegia, unspecified: Secondary | ICD-10-CM | POA: Diagnosis not present

## 2018-04-11 DIAGNOSIS — L89214 Pressure ulcer of right hip, stage 4: Secondary | ICD-10-CM | POA: Diagnosis not present

## 2018-04-11 DIAGNOSIS — R1312 Dysphagia, oropharyngeal phase: Secondary | ICD-10-CM | POA: Diagnosis not present

## 2018-04-12 DIAGNOSIS — R1312 Dysphagia, oropharyngeal phase: Secondary | ICD-10-CM | POA: Diagnosis not present

## 2018-04-12 DIAGNOSIS — S14122D Central cord syndrome at C2 level of cervical spinal cord, subsequent encounter: Secondary | ICD-10-CM | POA: Diagnosis not present

## 2018-04-12 DIAGNOSIS — G8250 Quadriplegia, unspecified: Secondary | ICD-10-CM | POA: Diagnosis not present

## 2018-04-12 DIAGNOSIS — M6281 Muscle weakness (generalized): Secondary | ICD-10-CM | POA: Diagnosis not present

## 2018-04-12 DIAGNOSIS — R41841 Cognitive communication deficit: Secondary | ICD-10-CM | POA: Diagnosis not present

## 2018-04-12 DIAGNOSIS — R279 Unspecified lack of coordination: Secondary | ICD-10-CM | POA: Diagnosis not present

## 2018-04-13 DIAGNOSIS — M6281 Muscle weakness (generalized): Secondary | ICD-10-CM | POA: Diagnosis not present

## 2018-04-13 DIAGNOSIS — G8250 Quadriplegia, unspecified: Secondary | ICD-10-CM | POA: Diagnosis not present

## 2018-04-13 DIAGNOSIS — R41841 Cognitive communication deficit: Secondary | ICD-10-CM | POA: Diagnosis not present

## 2018-04-13 DIAGNOSIS — R279 Unspecified lack of coordination: Secondary | ICD-10-CM | POA: Diagnosis not present

## 2018-04-13 DIAGNOSIS — R1312 Dysphagia, oropharyngeal phase: Secondary | ICD-10-CM | POA: Diagnosis not present

## 2018-04-13 DIAGNOSIS — S14122D Central cord syndrome at C2 level of cervical spinal cord, subsequent encounter: Secondary | ICD-10-CM | POA: Diagnosis not present

## 2018-04-14 DIAGNOSIS — G8250 Quadriplegia, unspecified: Secondary | ICD-10-CM | POA: Diagnosis not present

## 2018-04-14 DIAGNOSIS — R41841 Cognitive communication deficit: Secondary | ICD-10-CM | POA: Diagnosis not present

## 2018-04-14 DIAGNOSIS — S14122D Central cord syndrome at C2 level of cervical spinal cord, subsequent encounter: Secondary | ICD-10-CM | POA: Diagnosis not present

## 2018-04-14 DIAGNOSIS — R1312 Dysphagia, oropharyngeal phase: Secondary | ICD-10-CM | POA: Diagnosis not present

## 2018-04-14 DIAGNOSIS — R279 Unspecified lack of coordination: Secondary | ICD-10-CM | POA: Diagnosis not present

## 2018-04-14 DIAGNOSIS — M6281 Muscle weakness (generalized): Secondary | ICD-10-CM | POA: Diagnosis not present

## 2018-04-16 DIAGNOSIS — R279 Unspecified lack of coordination: Secondary | ICD-10-CM | POA: Diagnosis not present

## 2018-04-16 DIAGNOSIS — M6281 Muscle weakness (generalized): Secondary | ICD-10-CM | POA: Diagnosis not present

## 2018-04-16 DIAGNOSIS — R41841 Cognitive communication deficit: Secondary | ICD-10-CM | POA: Diagnosis not present

## 2018-04-16 DIAGNOSIS — R1312 Dysphagia, oropharyngeal phase: Secondary | ICD-10-CM | POA: Diagnosis not present

## 2018-04-16 DIAGNOSIS — G8250 Quadriplegia, unspecified: Secondary | ICD-10-CM | POA: Diagnosis not present

## 2018-04-16 DIAGNOSIS — S14122D Central cord syndrome at C2 level of cervical spinal cord, subsequent encounter: Secondary | ICD-10-CM | POA: Diagnosis not present

## 2018-04-17 DIAGNOSIS — S14122D Central cord syndrome at C2 level of cervical spinal cord, subsequent encounter: Secondary | ICD-10-CM | POA: Diagnosis not present

## 2018-04-17 DIAGNOSIS — R1312 Dysphagia, oropharyngeal phase: Secondary | ICD-10-CM | POA: Diagnosis not present

## 2018-04-17 DIAGNOSIS — R279 Unspecified lack of coordination: Secondary | ICD-10-CM | POA: Diagnosis not present

## 2018-04-17 DIAGNOSIS — G8250 Quadriplegia, unspecified: Secondary | ICD-10-CM | POA: Diagnosis not present

## 2018-04-17 DIAGNOSIS — R41841 Cognitive communication deficit: Secondary | ICD-10-CM | POA: Diagnosis not present

## 2018-04-17 DIAGNOSIS — M6281 Muscle weakness (generalized): Secondary | ICD-10-CM | POA: Diagnosis not present

## 2018-04-18 DIAGNOSIS — L89134 Pressure ulcer of right lower back, stage 4: Secondary | ICD-10-CM | POA: Diagnosis not present

## 2018-04-18 DIAGNOSIS — L89143 Pressure ulcer of left lower back, stage 3: Secondary | ICD-10-CM | POA: Diagnosis not present

## 2018-04-18 DIAGNOSIS — L89214 Pressure ulcer of right hip, stage 4: Secondary | ICD-10-CM | POA: Diagnosis not present

## 2018-04-19 DIAGNOSIS — R1312 Dysphagia, oropharyngeal phase: Secondary | ICD-10-CM | POA: Diagnosis not present

## 2018-04-19 DIAGNOSIS — G8250 Quadriplegia, unspecified: Secondary | ICD-10-CM | POA: Diagnosis not present

## 2018-04-19 DIAGNOSIS — R41841 Cognitive communication deficit: Secondary | ICD-10-CM | POA: Diagnosis not present

## 2018-04-19 DIAGNOSIS — S14122D Central cord syndrome at C2 level of cervical spinal cord, subsequent encounter: Secondary | ICD-10-CM | POA: Diagnosis not present

## 2018-04-19 DIAGNOSIS — M6281 Muscle weakness (generalized): Secondary | ICD-10-CM | POA: Diagnosis not present

## 2018-04-19 DIAGNOSIS — R279 Unspecified lack of coordination: Secondary | ICD-10-CM | POA: Diagnosis not present

## 2018-04-20 DIAGNOSIS — R41841 Cognitive communication deficit: Secondary | ICD-10-CM | POA: Diagnosis not present

## 2018-04-20 DIAGNOSIS — M6281 Muscle weakness (generalized): Secondary | ICD-10-CM | POA: Diagnosis not present

## 2018-04-20 DIAGNOSIS — S14122D Central cord syndrome at C2 level of cervical spinal cord, subsequent encounter: Secondary | ICD-10-CM | POA: Diagnosis not present

## 2018-04-20 DIAGNOSIS — G8250 Quadriplegia, unspecified: Secondary | ICD-10-CM | POA: Diagnosis not present

## 2018-04-20 DIAGNOSIS — R279 Unspecified lack of coordination: Secondary | ICD-10-CM | POA: Diagnosis not present

## 2018-04-20 DIAGNOSIS — R1312 Dysphagia, oropharyngeal phase: Secondary | ICD-10-CM | POA: Diagnosis not present

## 2018-04-21 DIAGNOSIS — S14122D Central cord syndrome at C2 level of cervical spinal cord, subsequent encounter: Secondary | ICD-10-CM | POA: Diagnosis not present

## 2018-04-21 DIAGNOSIS — G8250 Quadriplegia, unspecified: Secondary | ICD-10-CM | POA: Diagnosis not present

## 2018-04-21 DIAGNOSIS — R1312 Dysphagia, oropharyngeal phase: Secondary | ICD-10-CM | POA: Diagnosis not present

## 2018-04-21 DIAGNOSIS — R41841 Cognitive communication deficit: Secondary | ICD-10-CM | POA: Diagnosis not present

## 2018-04-21 DIAGNOSIS — R279 Unspecified lack of coordination: Secondary | ICD-10-CM | POA: Diagnosis not present

## 2018-04-21 DIAGNOSIS — M6281 Muscle weakness (generalized): Secondary | ICD-10-CM | POA: Diagnosis not present

## 2018-04-22 DIAGNOSIS — S14122D Central cord syndrome at C2 level of cervical spinal cord, subsequent encounter: Secondary | ICD-10-CM | POA: Diagnosis not present

## 2018-04-22 DIAGNOSIS — G8250 Quadriplegia, unspecified: Secondary | ICD-10-CM | POA: Diagnosis not present

## 2018-04-22 DIAGNOSIS — R41841 Cognitive communication deficit: Secondary | ICD-10-CM | POA: Diagnosis not present

## 2018-04-22 DIAGNOSIS — R1312 Dysphagia, oropharyngeal phase: Secondary | ICD-10-CM | POA: Diagnosis not present

## 2018-04-22 DIAGNOSIS — R279 Unspecified lack of coordination: Secondary | ICD-10-CM | POA: Diagnosis not present

## 2018-04-22 DIAGNOSIS — M6281 Muscle weakness (generalized): Secondary | ICD-10-CM | POA: Diagnosis not present

## 2018-04-24 DIAGNOSIS — M6281 Muscle weakness (generalized): Secondary | ICD-10-CM | POA: Diagnosis not present

## 2018-04-24 DIAGNOSIS — G8250 Quadriplegia, unspecified: Secondary | ICD-10-CM | POA: Diagnosis not present

## 2018-04-24 DIAGNOSIS — R1312 Dysphagia, oropharyngeal phase: Secondary | ICD-10-CM | POA: Diagnosis not present

## 2018-04-24 DIAGNOSIS — R279 Unspecified lack of coordination: Secondary | ICD-10-CM | POA: Diagnosis not present

## 2018-04-24 DIAGNOSIS — R41841 Cognitive communication deficit: Secondary | ICD-10-CM | POA: Diagnosis not present

## 2018-04-24 DIAGNOSIS — S14122D Central cord syndrome at C2 level of cervical spinal cord, subsequent encounter: Secondary | ICD-10-CM | POA: Diagnosis not present

## 2018-04-25 DIAGNOSIS — S14122D Central cord syndrome at C2 level of cervical spinal cord, subsequent encounter: Secondary | ICD-10-CM | POA: Diagnosis not present

## 2018-04-25 DIAGNOSIS — M6281 Muscle weakness (generalized): Secondary | ICD-10-CM | POA: Diagnosis not present

## 2018-04-25 DIAGNOSIS — R279 Unspecified lack of coordination: Secondary | ICD-10-CM | POA: Diagnosis not present

## 2018-04-25 DIAGNOSIS — R1312 Dysphagia, oropharyngeal phase: Secondary | ICD-10-CM | POA: Diagnosis not present

## 2018-04-25 DIAGNOSIS — R41841 Cognitive communication deficit: Secondary | ICD-10-CM | POA: Diagnosis not present

## 2018-04-25 DIAGNOSIS — G8250 Quadriplegia, unspecified: Secondary | ICD-10-CM | POA: Diagnosis not present

## 2018-04-26 DIAGNOSIS — G8250 Quadriplegia, unspecified: Secondary | ICD-10-CM | POA: Diagnosis not present

## 2018-04-26 DIAGNOSIS — R1312 Dysphagia, oropharyngeal phase: Secondary | ICD-10-CM | POA: Diagnosis not present

## 2018-04-26 DIAGNOSIS — M6281 Muscle weakness (generalized): Secondary | ICD-10-CM | POA: Diagnosis not present

## 2018-04-26 DIAGNOSIS — R41841 Cognitive communication deficit: Secondary | ICD-10-CM | POA: Diagnosis not present

## 2018-04-26 DIAGNOSIS — L89214 Pressure ulcer of right hip, stage 4: Secondary | ICD-10-CM | POA: Diagnosis not present

## 2018-04-26 DIAGNOSIS — S14122D Central cord syndrome at C2 level of cervical spinal cord, subsequent encounter: Secondary | ICD-10-CM | POA: Diagnosis not present

## 2018-04-26 DIAGNOSIS — R279 Unspecified lack of coordination: Secondary | ICD-10-CM | POA: Diagnosis not present

## 2018-04-26 DIAGNOSIS — L89134 Pressure ulcer of right lower back, stage 4: Secondary | ICD-10-CM | POA: Diagnosis not present

## 2018-04-26 DIAGNOSIS — L89143 Pressure ulcer of left lower back, stage 3: Secondary | ICD-10-CM | POA: Diagnosis not present

## 2018-04-27 ENCOUNTER — Encounter: Payer: Self-pay | Admitting: Gastroenterology

## 2018-04-27 DIAGNOSIS — R41841 Cognitive communication deficit: Secondary | ICD-10-CM | POA: Diagnosis not present

## 2018-04-27 DIAGNOSIS — G8250 Quadriplegia, unspecified: Secondary | ICD-10-CM | POA: Diagnosis not present

## 2018-04-27 DIAGNOSIS — R1312 Dysphagia, oropharyngeal phase: Secondary | ICD-10-CM | POA: Diagnosis not present

## 2018-04-27 DIAGNOSIS — S14122D Central cord syndrome at C2 level of cervical spinal cord, subsequent encounter: Secondary | ICD-10-CM | POA: Diagnosis not present

## 2018-04-27 DIAGNOSIS — R279 Unspecified lack of coordination: Secondary | ICD-10-CM | POA: Diagnosis not present

## 2018-04-27 DIAGNOSIS — M6281 Muscle weakness (generalized): Secondary | ICD-10-CM | POA: Diagnosis not present

## 2018-04-28 DIAGNOSIS — M6281 Muscle weakness (generalized): Secondary | ICD-10-CM | POA: Diagnosis not present

## 2018-04-28 DIAGNOSIS — R41841 Cognitive communication deficit: Secondary | ICD-10-CM | POA: Diagnosis not present

## 2018-04-28 DIAGNOSIS — R279 Unspecified lack of coordination: Secondary | ICD-10-CM | POA: Diagnosis not present

## 2018-04-28 DIAGNOSIS — R1312 Dysphagia, oropharyngeal phase: Secondary | ICD-10-CM | POA: Diagnosis not present

## 2018-04-28 DIAGNOSIS — S14122D Central cord syndrome at C2 level of cervical spinal cord, subsequent encounter: Secondary | ICD-10-CM | POA: Diagnosis not present

## 2018-04-28 DIAGNOSIS — G8250 Quadriplegia, unspecified: Secondary | ICD-10-CM | POA: Diagnosis not present

## 2018-04-29 DIAGNOSIS — M6281 Muscle weakness (generalized): Secondary | ICD-10-CM | POA: Diagnosis not present

## 2018-04-29 DIAGNOSIS — G8250 Quadriplegia, unspecified: Secondary | ICD-10-CM | POA: Diagnosis not present

## 2018-04-29 DIAGNOSIS — R279 Unspecified lack of coordination: Secondary | ICD-10-CM | POA: Diagnosis not present

## 2018-04-29 DIAGNOSIS — S14122D Central cord syndrome at C2 level of cervical spinal cord, subsequent encounter: Secondary | ICD-10-CM | POA: Diagnosis not present

## 2018-04-29 DIAGNOSIS — R1312 Dysphagia, oropharyngeal phase: Secondary | ICD-10-CM | POA: Diagnosis not present

## 2018-04-29 DIAGNOSIS — R41841 Cognitive communication deficit: Secondary | ICD-10-CM | POA: Diagnosis not present

## 2018-04-30 DIAGNOSIS — R279 Unspecified lack of coordination: Secondary | ICD-10-CM | POA: Diagnosis not present

## 2018-04-30 DIAGNOSIS — R41841 Cognitive communication deficit: Secondary | ICD-10-CM | POA: Diagnosis not present

## 2018-04-30 DIAGNOSIS — G8250 Quadriplegia, unspecified: Secondary | ICD-10-CM | POA: Diagnosis not present

## 2018-04-30 DIAGNOSIS — R1312 Dysphagia, oropharyngeal phase: Secondary | ICD-10-CM | POA: Diagnosis not present

## 2018-04-30 DIAGNOSIS — S14122D Central cord syndrome at C2 level of cervical spinal cord, subsequent encounter: Secondary | ICD-10-CM | POA: Diagnosis not present

## 2018-04-30 DIAGNOSIS — M6281 Muscle weakness (generalized): Secondary | ICD-10-CM | POA: Diagnosis not present

## 2018-05-01 DIAGNOSIS — M6281 Muscle weakness (generalized): Secondary | ICD-10-CM | POA: Diagnosis not present

## 2018-05-01 DIAGNOSIS — S14122D Central cord syndrome at C2 level of cervical spinal cord, subsequent encounter: Secondary | ICD-10-CM | POA: Diagnosis not present

## 2018-05-01 DIAGNOSIS — G8250 Quadriplegia, unspecified: Secondary | ICD-10-CM | POA: Diagnosis not present

## 2018-05-01 DIAGNOSIS — R41841 Cognitive communication deficit: Secondary | ICD-10-CM | POA: Diagnosis not present

## 2018-05-01 DIAGNOSIS — R1312 Dysphagia, oropharyngeal phase: Secondary | ICD-10-CM | POA: Diagnosis not present

## 2018-05-01 DIAGNOSIS — R279 Unspecified lack of coordination: Secondary | ICD-10-CM | POA: Diagnosis not present

## 2018-05-02 DIAGNOSIS — R41841 Cognitive communication deficit: Secondary | ICD-10-CM | POA: Diagnosis not present

## 2018-05-02 DIAGNOSIS — M6281 Muscle weakness (generalized): Secondary | ICD-10-CM | POA: Diagnosis not present

## 2018-05-02 DIAGNOSIS — R1312 Dysphagia, oropharyngeal phase: Secondary | ICD-10-CM | POA: Diagnosis not present

## 2018-05-02 DIAGNOSIS — R279 Unspecified lack of coordination: Secondary | ICD-10-CM | POA: Diagnosis not present

## 2018-05-02 DIAGNOSIS — L89214 Pressure ulcer of right hip, stage 4: Secondary | ICD-10-CM | POA: Diagnosis not present

## 2018-05-02 DIAGNOSIS — G8250 Quadriplegia, unspecified: Secondary | ICD-10-CM | POA: Diagnosis not present

## 2018-05-02 DIAGNOSIS — S14122D Central cord syndrome at C2 level of cervical spinal cord, subsequent encounter: Secondary | ICD-10-CM | POA: Diagnosis not present

## 2018-05-02 DIAGNOSIS — L89134 Pressure ulcer of right lower back, stage 4: Secondary | ICD-10-CM | POA: Diagnosis not present

## 2018-05-03 DIAGNOSIS — G8250 Quadriplegia, unspecified: Secondary | ICD-10-CM | POA: Diagnosis not present

## 2018-05-03 DIAGNOSIS — R279 Unspecified lack of coordination: Secondary | ICD-10-CM | POA: Diagnosis not present

## 2018-05-03 DIAGNOSIS — M6281 Muscle weakness (generalized): Secondary | ICD-10-CM | POA: Diagnosis not present

## 2018-05-03 DIAGNOSIS — R41841 Cognitive communication deficit: Secondary | ICD-10-CM | POA: Diagnosis not present

## 2018-05-03 DIAGNOSIS — S14122D Central cord syndrome at C2 level of cervical spinal cord, subsequent encounter: Secondary | ICD-10-CM | POA: Diagnosis not present

## 2018-05-03 DIAGNOSIS — R1312 Dysphagia, oropharyngeal phase: Secondary | ICD-10-CM | POA: Diagnosis not present

## 2018-05-04 DIAGNOSIS — R41841 Cognitive communication deficit: Secondary | ICD-10-CM | POA: Diagnosis not present

## 2018-05-04 DIAGNOSIS — R1312 Dysphagia, oropharyngeal phase: Secondary | ICD-10-CM | POA: Diagnosis not present

## 2018-05-04 DIAGNOSIS — G8250 Quadriplegia, unspecified: Secondary | ICD-10-CM | POA: Diagnosis not present

## 2018-05-04 DIAGNOSIS — M6281 Muscle weakness (generalized): Secondary | ICD-10-CM | POA: Diagnosis not present

## 2018-05-04 DIAGNOSIS — R279 Unspecified lack of coordination: Secondary | ICD-10-CM | POA: Diagnosis not present

## 2018-05-04 DIAGNOSIS — S14122D Central cord syndrome at C2 level of cervical spinal cord, subsequent encounter: Secondary | ICD-10-CM | POA: Diagnosis not present

## 2018-05-06 DIAGNOSIS — S14122D Central cord syndrome at C2 level of cervical spinal cord, subsequent encounter: Secondary | ICD-10-CM | POA: Diagnosis not present

## 2018-05-06 DIAGNOSIS — G8250 Quadriplegia, unspecified: Secondary | ICD-10-CM | POA: Diagnosis not present

## 2018-05-06 DIAGNOSIS — M6281 Muscle weakness (generalized): Secondary | ICD-10-CM | POA: Diagnosis not present

## 2018-05-06 DIAGNOSIS — R41841 Cognitive communication deficit: Secondary | ICD-10-CM | POA: Diagnosis not present

## 2018-05-06 DIAGNOSIS — R279 Unspecified lack of coordination: Secondary | ICD-10-CM | POA: Diagnosis not present

## 2018-05-06 DIAGNOSIS — R1312 Dysphagia, oropharyngeal phase: Secondary | ICD-10-CM | POA: Diagnosis not present

## 2018-05-07 DIAGNOSIS — S14122D Central cord syndrome at C2 level of cervical spinal cord, subsequent encounter: Secondary | ICD-10-CM | POA: Diagnosis not present

## 2018-05-07 DIAGNOSIS — R279 Unspecified lack of coordination: Secondary | ICD-10-CM | POA: Diagnosis not present

## 2018-05-07 DIAGNOSIS — R41841 Cognitive communication deficit: Secondary | ICD-10-CM | POA: Diagnosis not present

## 2018-05-07 DIAGNOSIS — G8250 Quadriplegia, unspecified: Secondary | ICD-10-CM | POA: Diagnosis not present

## 2018-05-07 DIAGNOSIS — R1312 Dysphagia, oropharyngeal phase: Secondary | ICD-10-CM | POA: Diagnosis not present

## 2018-05-07 DIAGNOSIS — M6281 Muscle weakness (generalized): Secondary | ICD-10-CM | POA: Diagnosis not present

## 2018-05-08 DIAGNOSIS — R279 Unspecified lack of coordination: Secondary | ICD-10-CM | POA: Diagnosis not present

## 2018-05-08 DIAGNOSIS — M6281 Muscle weakness (generalized): Secondary | ICD-10-CM | POA: Diagnosis not present

## 2018-05-08 DIAGNOSIS — R1312 Dysphagia, oropharyngeal phase: Secondary | ICD-10-CM | POA: Diagnosis not present

## 2018-05-08 DIAGNOSIS — G8250 Quadriplegia, unspecified: Secondary | ICD-10-CM | POA: Diagnosis not present

## 2018-05-08 DIAGNOSIS — S14122D Central cord syndrome at C2 level of cervical spinal cord, subsequent encounter: Secondary | ICD-10-CM | POA: Diagnosis not present

## 2018-05-08 DIAGNOSIS — R41841 Cognitive communication deficit: Secondary | ICD-10-CM | POA: Diagnosis not present

## 2018-05-09 DIAGNOSIS — G8250 Quadriplegia, unspecified: Secondary | ICD-10-CM | POA: Diagnosis not present

## 2018-05-09 DIAGNOSIS — L89214 Pressure ulcer of right hip, stage 4: Secondary | ICD-10-CM | POA: Diagnosis not present

## 2018-05-09 DIAGNOSIS — R41841 Cognitive communication deficit: Secondary | ICD-10-CM | POA: Diagnosis not present

## 2018-05-09 DIAGNOSIS — S14122D Central cord syndrome at C2 level of cervical spinal cord, subsequent encounter: Secondary | ICD-10-CM | POA: Diagnosis not present

## 2018-05-09 DIAGNOSIS — L89143 Pressure ulcer of left lower back, stage 3: Secondary | ICD-10-CM | POA: Diagnosis not present

## 2018-05-09 DIAGNOSIS — M6281 Muscle weakness (generalized): Secondary | ICD-10-CM | POA: Diagnosis not present

## 2018-05-09 DIAGNOSIS — R279 Unspecified lack of coordination: Secondary | ICD-10-CM | POA: Diagnosis not present

## 2018-05-09 DIAGNOSIS — R1312 Dysphagia, oropharyngeal phase: Secondary | ICD-10-CM | POA: Diagnosis not present

## 2018-05-10 DIAGNOSIS — D649 Anemia, unspecified: Secondary | ICD-10-CM | POA: Diagnosis not present

## 2018-05-10 DIAGNOSIS — R279 Unspecified lack of coordination: Secondary | ICD-10-CM | POA: Diagnosis not present

## 2018-05-10 DIAGNOSIS — R509 Fever, unspecified: Secondary | ICD-10-CM | POA: Diagnosis not present

## 2018-05-10 DIAGNOSIS — R05 Cough: Secondary | ICD-10-CM | POA: Diagnosis not present

## 2018-05-10 DIAGNOSIS — M6281 Muscle weakness (generalized): Secondary | ICD-10-CM | POA: Diagnosis not present

## 2018-05-10 DIAGNOSIS — R41841 Cognitive communication deficit: Secondary | ICD-10-CM | POA: Diagnosis not present

## 2018-05-10 DIAGNOSIS — S14122D Central cord syndrome at C2 level of cervical spinal cord, subsequent encounter: Secondary | ICD-10-CM | POA: Diagnosis not present

## 2018-05-10 DIAGNOSIS — R1312 Dysphagia, oropharyngeal phase: Secondary | ICD-10-CM | POA: Diagnosis not present

## 2018-05-10 DIAGNOSIS — G8250 Quadriplegia, unspecified: Secondary | ICD-10-CM | POA: Diagnosis not present

## 2018-05-11 DIAGNOSIS — M6281 Muscle weakness (generalized): Secondary | ICD-10-CM | POA: Diagnosis not present

## 2018-05-11 DIAGNOSIS — R279 Unspecified lack of coordination: Secondary | ICD-10-CM | POA: Diagnosis not present

## 2018-05-11 DIAGNOSIS — R1312 Dysphagia, oropharyngeal phase: Secondary | ICD-10-CM | POA: Diagnosis not present

## 2018-05-11 DIAGNOSIS — R41841 Cognitive communication deficit: Secondary | ICD-10-CM | POA: Diagnosis not present

## 2018-05-11 DIAGNOSIS — G8250 Quadriplegia, unspecified: Secondary | ICD-10-CM | POA: Diagnosis not present

## 2018-05-11 DIAGNOSIS — S14122D Central cord syndrome at C2 level of cervical spinal cord, subsequent encounter: Secondary | ICD-10-CM | POA: Diagnosis not present

## 2018-05-12 DIAGNOSIS — M6281 Muscle weakness (generalized): Secondary | ICD-10-CM | POA: Diagnosis not present

## 2018-05-12 DIAGNOSIS — Z7901 Long term (current) use of anticoagulants: Secondary | ICD-10-CM | POA: Diagnosis not present

## 2018-05-12 DIAGNOSIS — R41841 Cognitive communication deficit: Secondary | ICD-10-CM | POA: Diagnosis not present

## 2018-05-12 DIAGNOSIS — S14122D Central cord syndrome at C2 level of cervical spinal cord, subsequent encounter: Secondary | ICD-10-CM | POA: Diagnosis not present

## 2018-05-12 DIAGNOSIS — R1312 Dysphagia, oropharyngeal phase: Secondary | ICD-10-CM | POA: Diagnosis not present

## 2018-05-12 DIAGNOSIS — G8250 Quadriplegia, unspecified: Secondary | ICD-10-CM | POA: Diagnosis not present

## 2018-05-12 DIAGNOSIS — R279 Unspecified lack of coordination: Secondary | ICD-10-CM | POA: Diagnosis not present

## 2018-05-13 DIAGNOSIS — R279 Unspecified lack of coordination: Secondary | ICD-10-CM | POA: Diagnosis not present

## 2018-05-13 DIAGNOSIS — M6281 Muscle weakness (generalized): Secondary | ICD-10-CM | POA: Diagnosis not present

## 2018-05-13 DIAGNOSIS — S14122D Central cord syndrome at C2 level of cervical spinal cord, subsequent encounter: Secondary | ICD-10-CM | POA: Diagnosis not present

## 2018-05-13 DIAGNOSIS — G8250 Quadriplegia, unspecified: Secondary | ICD-10-CM | POA: Diagnosis not present

## 2018-05-13 DIAGNOSIS — R1312 Dysphagia, oropharyngeal phase: Secondary | ICD-10-CM | POA: Diagnosis not present

## 2018-05-13 DIAGNOSIS — R41841 Cognitive communication deficit: Secondary | ICD-10-CM | POA: Diagnosis not present

## 2018-05-14 DIAGNOSIS — M6281 Muscle weakness (generalized): Secondary | ICD-10-CM | POA: Diagnosis not present

## 2018-05-14 DIAGNOSIS — R279 Unspecified lack of coordination: Secondary | ICD-10-CM | POA: Diagnosis not present

## 2018-05-14 DIAGNOSIS — G8250 Quadriplegia, unspecified: Secondary | ICD-10-CM | POA: Diagnosis not present

## 2018-05-14 DIAGNOSIS — R41841 Cognitive communication deficit: Secondary | ICD-10-CM | POA: Diagnosis not present

## 2018-05-14 DIAGNOSIS — R1312 Dysphagia, oropharyngeal phase: Secondary | ICD-10-CM | POA: Diagnosis not present

## 2018-05-14 DIAGNOSIS — S14122D Central cord syndrome at C2 level of cervical spinal cord, subsequent encounter: Secondary | ICD-10-CM | POA: Diagnosis not present

## 2018-05-15 DIAGNOSIS — R41841 Cognitive communication deficit: Secondary | ICD-10-CM | POA: Diagnosis not present

## 2018-05-15 DIAGNOSIS — R1312 Dysphagia, oropharyngeal phase: Secondary | ICD-10-CM | POA: Diagnosis not present

## 2018-05-15 DIAGNOSIS — G8250 Quadriplegia, unspecified: Secondary | ICD-10-CM | POA: Diagnosis not present

## 2018-05-15 DIAGNOSIS — M6281 Muscle weakness (generalized): Secondary | ICD-10-CM | POA: Diagnosis not present

## 2018-05-15 DIAGNOSIS — R279 Unspecified lack of coordination: Secondary | ICD-10-CM | POA: Diagnosis not present

## 2018-05-15 DIAGNOSIS — S14122D Central cord syndrome at C2 level of cervical spinal cord, subsequent encounter: Secondary | ICD-10-CM | POA: Diagnosis not present

## 2018-05-16 DIAGNOSIS — R41841 Cognitive communication deficit: Secondary | ICD-10-CM | POA: Diagnosis not present

## 2018-05-16 DIAGNOSIS — M6281 Muscle weakness (generalized): Secondary | ICD-10-CM | POA: Diagnosis not present

## 2018-05-16 DIAGNOSIS — S14122D Central cord syndrome at C2 level of cervical spinal cord, subsequent encounter: Secondary | ICD-10-CM | POA: Diagnosis not present

## 2018-05-16 DIAGNOSIS — L89214 Pressure ulcer of right hip, stage 4: Secondary | ICD-10-CM | POA: Diagnosis not present

## 2018-05-16 DIAGNOSIS — G8250 Quadriplegia, unspecified: Secondary | ICD-10-CM | POA: Diagnosis not present

## 2018-05-16 DIAGNOSIS — R279 Unspecified lack of coordination: Secondary | ICD-10-CM | POA: Diagnosis not present

## 2018-05-16 DIAGNOSIS — L89134 Pressure ulcer of right lower back, stage 4: Secondary | ICD-10-CM | POA: Diagnosis not present

## 2018-05-16 DIAGNOSIS — R1312 Dysphagia, oropharyngeal phase: Secondary | ICD-10-CM | POA: Diagnosis not present

## 2018-05-17 DIAGNOSIS — R41841 Cognitive communication deficit: Secondary | ICD-10-CM | POA: Diagnosis not present

## 2018-05-17 DIAGNOSIS — S14122D Central cord syndrome at C2 level of cervical spinal cord, subsequent encounter: Secondary | ICD-10-CM | POA: Diagnosis not present

## 2018-05-17 DIAGNOSIS — G8250 Quadriplegia, unspecified: Secondary | ICD-10-CM | POA: Diagnosis not present

## 2018-05-17 DIAGNOSIS — M6281 Muscle weakness (generalized): Secondary | ICD-10-CM | POA: Diagnosis not present

## 2018-05-17 DIAGNOSIS — R1312 Dysphagia, oropharyngeal phase: Secondary | ICD-10-CM | POA: Diagnosis not present

## 2018-05-17 DIAGNOSIS — R279 Unspecified lack of coordination: Secondary | ICD-10-CM | POA: Diagnosis not present

## 2018-05-18 DIAGNOSIS — R1312 Dysphagia, oropharyngeal phase: Secondary | ICD-10-CM | POA: Diagnosis not present

## 2018-05-18 DIAGNOSIS — S14122D Central cord syndrome at C2 level of cervical spinal cord, subsequent encounter: Secondary | ICD-10-CM | POA: Diagnosis not present

## 2018-05-18 DIAGNOSIS — M6281 Muscle weakness (generalized): Secondary | ICD-10-CM | POA: Diagnosis not present

## 2018-05-18 DIAGNOSIS — G8250 Quadriplegia, unspecified: Secondary | ICD-10-CM | POA: Diagnosis not present

## 2018-05-18 DIAGNOSIS — R41841 Cognitive communication deficit: Secondary | ICD-10-CM | POA: Diagnosis not present

## 2018-05-18 DIAGNOSIS — R279 Unspecified lack of coordination: Secondary | ICD-10-CM | POA: Diagnosis not present

## 2018-05-19 DIAGNOSIS — R1312 Dysphagia, oropharyngeal phase: Secondary | ICD-10-CM | POA: Diagnosis not present

## 2018-05-19 DIAGNOSIS — F333 Major depressive disorder, recurrent, severe with psychotic symptoms: Secondary | ICD-10-CM | POA: Diagnosis not present

## 2018-05-19 DIAGNOSIS — F919 Conduct disorder, unspecified: Secondary | ICD-10-CM | POA: Diagnosis not present

## 2018-05-19 DIAGNOSIS — F413 Other mixed anxiety disorders: Secondary | ICD-10-CM | POA: Diagnosis not present

## 2018-05-19 DIAGNOSIS — G8250 Quadriplegia, unspecified: Secondary | ICD-10-CM | POA: Diagnosis not present

## 2018-05-19 DIAGNOSIS — S14122D Central cord syndrome at C2 level of cervical spinal cord, subsequent encounter: Secondary | ICD-10-CM | POA: Diagnosis not present

## 2018-05-19 DIAGNOSIS — R279 Unspecified lack of coordination: Secondary | ICD-10-CM | POA: Diagnosis not present

## 2018-05-19 DIAGNOSIS — R41841 Cognitive communication deficit: Secondary | ICD-10-CM | POA: Diagnosis not present

## 2018-05-19 DIAGNOSIS — M6281 Muscle weakness (generalized): Secondary | ICD-10-CM | POA: Diagnosis not present

## 2018-05-19 DIAGNOSIS — F201 Disorganized schizophrenia: Secondary | ICD-10-CM | POA: Diagnosis not present

## 2018-05-20 DIAGNOSIS — R279 Unspecified lack of coordination: Secondary | ICD-10-CM | POA: Diagnosis not present

## 2018-05-20 DIAGNOSIS — R1312 Dysphagia, oropharyngeal phase: Secondary | ICD-10-CM | POA: Diagnosis not present

## 2018-05-20 DIAGNOSIS — R41841 Cognitive communication deficit: Secondary | ICD-10-CM | POA: Diagnosis not present

## 2018-05-20 DIAGNOSIS — M6281 Muscle weakness (generalized): Secondary | ICD-10-CM | POA: Diagnosis not present

## 2018-05-20 DIAGNOSIS — S14122D Central cord syndrome at C2 level of cervical spinal cord, subsequent encounter: Secondary | ICD-10-CM | POA: Diagnosis not present

## 2018-05-20 DIAGNOSIS — G8250 Quadriplegia, unspecified: Secondary | ICD-10-CM | POA: Diagnosis not present

## 2018-05-21 DIAGNOSIS — S14122D Central cord syndrome at C2 level of cervical spinal cord, subsequent encounter: Secondary | ICD-10-CM | POA: Diagnosis not present

## 2018-05-21 DIAGNOSIS — R279 Unspecified lack of coordination: Secondary | ICD-10-CM | POA: Diagnosis not present

## 2018-05-21 DIAGNOSIS — M6281 Muscle weakness (generalized): Secondary | ICD-10-CM | POA: Diagnosis not present

## 2018-05-21 DIAGNOSIS — G8250 Quadriplegia, unspecified: Secondary | ICD-10-CM | POA: Diagnosis not present

## 2018-05-21 DIAGNOSIS — R41841 Cognitive communication deficit: Secondary | ICD-10-CM | POA: Diagnosis not present

## 2018-05-21 DIAGNOSIS — R1312 Dysphagia, oropharyngeal phase: Secondary | ICD-10-CM | POA: Diagnosis not present

## 2018-05-22 DIAGNOSIS — R41841 Cognitive communication deficit: Secondary | ICD-10-CM | POA: Diagnosis not present

## 2018-05-22 DIAGNOSIS — S14122D Central cord syndrome at C2 level of cervical spinal cord, subsequent encounter: Secondary | ICD-10-CM | POA: Diagnosis not present

## 2018-05-22 DIAGNOSIS — R279 Unspecified lack of coordination: Secondary | ICD-10-CM | POA: Diagnosis not present

## 2018-05-22 DIAGNOSIS — M6281 Muscle weakness (generalized): Secondary | ICD-10-CM | POA: Diagnosis not present

## 2018-05-22 DIAGNOSIS — G8250 Quadriplegia, unspecified: Secondary | ICD-10-CM | POA: Diagnosis not present

## 2018-05-22 DIAGNOSIS — R1312 Dysphagia, oropharyngeal phase: Secondary | ICD-10-CM | POA: Diagnosis not present

## 2018-05-23 DIAGNOSIS — F209 Schizophrenia, unspecified: Secondary | ICD-10-CM | POA: Diagnosis not present

## 2018-05-23 DIAGNOSIS — S14122D Central cord syndrome at C2 level of cervical spinal cord, subsequent encounter: Secondary | ICD-10-CM | POA: Diagnosis not present

## 2018-05-23 DIAGNOSIS — I8290 Acute embolism and thrombosis of unspecified vein: Secondary | ICD-10-CM | POA: Diagnosis not present

## 2018-05-23 DIAGNOSIS — R41841 Cognitive communication deficit: Secondary | ICD-10-CM | POA: Diagnosis not present

## 2018-05-23 DIAGNOSIS — M6281 Muscle weakness (generalized): Secondary | ICD-10-CM | POA: Diagnosis not present

## 2018-05-23 DIAGNOSIS — R1312 Dysphagia, oropharyngeal phase: Secondary | ICD-10-CM | POA: Diagnosis not present

## 2018-05-23 DIAGNOSIS — D509 Iron deficiency anemia, unspecified: Secondary | ICD-10-CM | POA: Diagnosis not present

## 2018-05-23 DIAGNOSIS — I82409 Acute embolism and thrombosis of unspecified deep veins of unspecified lower extremity: Secondary | ICD-10-CM | POA: Diagnosis not present

## 2018-05-23 DIAGNOSIS — R279 Unspecified lack of coordination: Secondary | ICD-10-CM | POA: Diagnosis not present

## 2018-05-23 DIAGNOSIS — G8250 Quadriplegia, unspecified: Secondary | ICD-10-CM | POA: Diagnosis not present

## 2018-05-24 DIAGNOSIS — R1312 Dysphagia, oropharyngeal phase: Secondary | ICD-10-CM | POA: Diagnosis not present

## 2018-05-24 DIAGNOSIS — R41841 Cognitive communication deficit: Secondary | ICD-10-CM | POA: Diagnosis not present

## 2018-05-24 DIAGNOSIS — G8250 Quadriplegia, unspecified: Secondary | ICD-10-CM | POA: Diagnosis not present

## 2018-05-24 DIAGNOSIS — S14122D Central cord syndrome at C2 level of cervical spinal cord, subsequent encounter: Secondary | ICD-10-CM | POA: Diagnosis not present

## 2018-05-24 DIAGNOSIS — M6281 Muscle weakness (generalized): Secondary | ICD-10-CM | POA: Diagnosis not present

## 2018-05-24 DIAGNOSIS — R279 Unspecified lack of coordination: Secondary | ICD-10-CM | POA: Diagnosis not present

## 2018-05-25 DIAGNOSIS — L89214 Pressure ulcer of right hip, stage 4: Secondary | ICD-10-CM | POA: Diagnosis not present

## 2018-05-25 DIAGNOSIS — M6281 Muscle weakness (generalized): Secondary | ICD-10-CM | POA: Diagnosis not present

## 2018-05-25 DIAGNOSIS — R279 Unspecified lack of coordination: Secondary | ICD-10-CM | POA: Diagnosis not present

## 2018-05-25 DIAGNOSIS — L89134 Pressure ulcer of right lower back, stage 4: Secondary | ICD-10-CM | POA: Diagnosis not present

## 2018-05-25 DIAGNOSIS — R41841 Cognitive communication deficit: Secondary | ICD-10-CM | POA: Diagnosis not present

## 2018-05-25 DIAGNOSIS — R1312 Dysphagia, oropharyngeal phase: Secondary | ICD-10-CM | POA: Diagnosis not present

## 2018-05-25 DIAGNOSIS — G8250 Quadriplegia, unspecified: Secondary | ICD-10-CM | POA: Diagnosis not present

## 2018-05-25 DIAGNOSIS — L89143 Pressure ulcer of left lower back, stage 3: Secondary | ICD-10-CM | POA: Diagnosis not present

## 2018-05-25 DIAGNOSIS — S14122D Central cord syndrome at C2 level of cervical spinal cord, subsequent encounter: Secondary | ICD-10-CM | POA: Diagnosis not present

## 2018-05-26 DIAGNOSIS — R41841 Cognitive communication deficit: Secondary | ICD-10-CM | POA: Diagnosis not present

## 2018-05-26 DIAGNOSIS — R1312 Dysphagia, oropharyngeal phase: Secondary | ICD-10-CM | POA: Diagnosis not present

## 2018-05-26 DIAGNOSIS — M6281 Muscle weakness (generalized): Secondary | ICD-10-CM | POA: Diagnosis not present

## 2018-05-26 DIAGNOSIS — R279 Unspecified lack of coordination: Secondary | ICD-10-CM | POA: Diagnosis not present

## 2018-05-26 DIAGNOSIS — S14122D Central cord syndrome at C2 level of cervical spinal cord, subsequent encounter: Secondary | ICD-10-CM | POA: Diagnosis not present

## 2018-05-26 DIAGNOSIS — G8250 Quadriplegia, unspecified: Secondary | ICD-10-CM | POA: Diagnosis not present

## 2018-05-28 DIAGNOSIS — M6281 Muscle weakness (generalized): Secondary | ICD-10-CM | POA: Diagnosis not present

## 2018-05-28 DIAGNOSIS — R1312 Dysphagia, oropharyngeal phase: Secondary | ICD-10-CM | POA: Diagnosis not present

## 2018-05-28 DIAGNOSIS — R279 Unspecified lack of coordination: Secondary | ICD-10-CM | POA: Diagnosis not present

## 2018-05-28 DIAGNOSIS — G8250 Quadriplegia, unspecified: Secondary | ICD-10-CM | POA: Diagnosis not present

## 2018-05-28 DIAGNOSIS — S14122D Central cord syndrome at C2 level of cervical spinal cord, subsequent encounter: Secondary | ICD-10-CM | POA: Diagnosis not present

## 2018-05-28 DIAGNOSIS — R41841 Cognitive communication deficit: Secondary | ICD-10-CM | POA: Diagnosis not present

## 2018-05-29 DIAGNOSIS — E46 Unspecified protein-calorie malnutrition: Secondary | ICD-10-CM | POA: Diagnosis not present

## 2018-05-29 DIAGNOSIS — R41841 Cognitive communication deficit: Secondary | ICD-10-CM | POA: Diagnosis not present

## 2018-05-29 DIAGNOSIS — R1311 Dysphagia, oral phase: Secondary | ICD-10-CM | POA: Diagnosis not present

## 2018-05-29 DIAGNOSIS — M6281 Muscle weakness (generalized): Secondary | ICD-10-CM | POA: Diagnosis not present

## 2018-05-29 DIAGNOSIS — R279 Unspecified lack of coordination: Secondary | ICD-10-CM | POA: Diagnosis not present

## 2018-05-29 DIAGNOSIS — S14122D Central cord syndrome at C2 level of cervical spinal cord, subsequent encounter: Secondary | ICD-10-CM | POA: Diagnosis not present

## 2018-05-29 DIAGNOSIS — F209 Schizophrenia, unspecified: Secondary | ICD-10-CM | POA: Diagnosis not present

## 2018-05-29 DIAGNOSIS — R52 Pain, unspecified: Secondary | ICD-10-CM | POA: Diagnosis not present

## 2018-05-29 DIAGNOSIS — R1312 Dysphagia, oropharyngeal phase: Secondary | ICD-10-CM | POA: Diagnosis not present

## 2018-05-29 DIAGNOSIS — G8250 Quadriplegia, unspecified: Secondary | ICD-10-CM | POA: Diagnosis not present

## 2018-05-30 DIAGNOSIS — R52 Pain, unspecified: Secondary | ICD-10-CM | POA: Diagnosis not present

## 2018-05-30 DIAGNOSIS — G8250 Quadriplegia, unspecified: Secondary | ICD-10-CM | POA: Diagnosis not present

## 2018-05-30 DIAGNOSIS — R1311 Dysphagia, oral phase: Secondary | ICD-10-CM | POA: Diagnosis not present

## 2018-05-30 DIAGNOSIS — E46 Unspecified protein-calorie malnutrition: Secondary | ICD-10-CM | POA: Diagnosis not present

## 2018-05-30 DIAGNOSIS — F209 Schizophrenia, unspecified: Secondary | ICD-10-CM | POA: Diagnosis not present

## 2018-05-31 DIAGNOSIS — E46 Unspecified protein-calorie malnutrition: Secondary | ICD-10-CM | POA: Diagnosis not present

## 2018-05-31 DIAGNOSIS — L89134 Pressure ulcer of right lower back, stage 4: Secondary | ICD-10-CM | POA: Diagnosis not present

## 2018-05-31 DIAGNOSIS — G8250 Quadriplegia, unspecified: Secondary | ICD-10-CM | POA: Diagnosis not present

## 2018-05-31 DIAGNOSIS — R52 Pain, unspecified: Secondary | ICD-10-CM | POA: Diagnosis not present

## 2018-05-31 DIAGNOSIS — R1311 Dysphagia, oral phase: Secondary | ICD-10-CM | POA: Diagnosis not present

## 2018-05-31 DIAGNOSIS — F209 Schizophrenia, unspecified: Secondary | ICD-10-CM | POA: Diagnosis not present

## 2018-05-31 DIAGNOSIS — L89143 Pressure ulcer of left lower back, stage 3: Secondary | ICD-10-CM | POA: Diagnosis not present

## 2018-06-05 DIAGNOSIS — G8250 Quadriplegia, unspecified: Secondary | ICD-10-CM | POA: Diagnosis not present

## 2018-06-05 DIAGNOSIS — R1311 Dysphagia, oral phase: Secondary | ICD-10-CM | POA: Diagnosis not present

## 2018-06-05 DIAGNOSIS — F209 Schizophrenia, unspecified: Secondary | ICD-10-CM | POA: Diagnosis not present

## 2018-06-05 DIAGNOSIS — E46 Unspecified protein-calorie malnutrition: Secondary | ICD-10-CM | POA: Diagnosis not present

## 2018-06-05 DIAGNOSIS — R52 Pain, unspecified: Secondary | ICD-10-CM | POA: Diagnosis not present

## 2018-06-07 DIAGNOSIS — G8250 Quadriplegia, unspecified: Secondary | ICD-10-CM | POA: Diagnosis not present

## 2018-06-07 DIAGNOSIS — R52 Pain, unspecified: Secondary | ICD-10-CM | POA: Diagnosis not present

## 2018-06-07 DIAGNOSIS — R1311 Dysphagia, oral phase: Secondary | ICD-10-CM | POA: Diagnosis not present

## 2018-06-07 DIAGNOSIS — L89144 Pressure ulcer of left lower back, stage 4: Secondary | ICD-10-CM | POA: Diagnosis not present

## 2018-06-07 DIAGNOSIS — F209 Schizophrenia, unspecified: Secondary | ICD-10-CM | POA: Diagnosis not present

## 2018-06-07 DIAGNOSIS — E46 Unspecified protein-calorie malnutrition: Secondary | ICD-10-CM | POA: Diagnosis not present

## 2018-06-07 DIAGNOSIS — L89134 Pressure ulcer of right lower back, stage 4: Secondary | ICD-10-CM | POA: Diagnosis not present

## 2018-06-07 DIAGNOSIS — L97314 Non-pressure chronic ulcer of right ankle with necrosis of bone: Secondary | ICD-10-CM | POA: Diagnosis not present

## 2018-06-08 DIAGNOSIS — R1311 Dysphagia, oral phase: Secondary | ICD-10-CM | POA: Diagnosis not present

## 2018-06-08 DIAGNOSIS — G8250 Quadriplegia, unspecified: Secondary | ICD-10-CM | POA: Diagnosis not present

## 2018-06-08 DIAGNOSIS — R52 Pain, unspecified: Secondary | ICD-10-CM | POA: Diagnosis not present

## 2018-06-08 DIAGNOSIS — E46 Unspecified protein-calorie malnutrition: Secondary | ICD-10-CM | POA: Diagnosis not present

## 2018-06-08 DIAGNOSIS — F209 Schizophrenia, unspecified: Secondary | ICD-10-CM | POA: Diagnosis not present

## 2018-06-12 DIAGNOSIS — F209 Schizophrenia, unspecified: Secondary | ICD-10-CM | POA: Diagnosis not present

## 2018-06-12 DIAGNOSIS — R52 Pain, unspecified: Secondary | ICD-10-CM | POA: Diagnosis not present

## 2018-06-12 DIAGNOSIS — E46 Unspecified protein-calorie malnutrition: Secondary | ICD-10-CM | POA: Diagnosis not present

## 2018-06-12 DIAGNOSIS — G8250 Quadriplegia, unspecified: Secondary | ICD-10-CM | POA: Diagnosis not present

## 2018-06-12 DIAGNOSIS — R1311 Dysphagia, oral phase: Secondary | ICD-10-CM | POA: Diagnosis not present

## 2018-06-14 DIAGNOSIS — F209 Schizophrenia, unspecified: Secondary | ICD-10-CM | POA: Diagnosis not present

## 2018-06-14 DIAGNOSIS — L97313 Non-pressure chronic ulcer of right ankle with necrosis of muscle: Secondary | ICD-10-CM | POA: Diagnosis not present

## 2018-06-14 DIAGNOSIS — E46 Unspecified protein-calorie malnutrition: Secondary | ICD-10-CM | POA: Diagnosis not present

## 2018-06-14 DIAGNOSIS — L89134 Pressure ulcer of right lower back, stage 4: Secondary | ICD-10-CM | POA: Diagnosis not present

## 2018-06-14 DIAGNOSIS — G8250 Quadriplegia, unspecified: Secondary | ICD-10-CM | POA: Diagnosis not present

## 2018-06-14 DIAGNOSIS — R52 Pain, unspecified: Secondary | ICD-10-CM | POA: Diagnosis not present

## 2018-06-14 DIAGNOSIS — R1311 Dysphagia, oral phase: Secondary | ICD-10-CM | POA: Diagnosis not present

## 2018-06-19 DIAGNOSIS — F209 Schizophrenia, unspecified: Secondary | ICD-10-CM | POA: Diagnosis not present

## 2018-06-19 DIAGNOSIS — R52 Pain, unspecified: Secondary | ICD-10-CM | POA: Diagnosis not present

## 2018-06-19 DIAGNOSIS — G8250 Quadriplegia, unspecified: Secondary | ICD-10-CM | POA: Diagnosis not present

## 2018-06-19 DIAGNOSIS — E46 Unspecified protein-calorie malnutrition: Secondary | ICD-10-CM | POA: Diagnosis not present

## 2018-06-19 DIAGNOSIS — R1311 Dysphagia, oral phase: Secondary | ICD-10-CM | POA: Diagnosis not present

## 2018-06-20 DIAGNOSIS — G8250 Quadriplegia, unspecified: Secondary | ICD-10-CM | POA: Diagnosis not present

## 2018-06-20 DIAGNOSIS — E46 Unspecified protein-calorie malnutrition: Secondary | ICD-10-CM | POA: Diagnosis not present

## 2018-06-20 DIAGNOSIS — R1311 Dysphagia, oral phase: Secondary | ICD-10-CM | POA: Diagnosis not present

## 2018-06-20 DIAGNOSIS — R52 Pain, unspecified: Secondary | ICD-10-CM | POA: Diagnosis not present

## 2018-06-20 DIAGNOSIS — F209 Schizophrenia, unspecified: Secondary | ICD-10-CM | POA: Diagnosis not present

## 2018-06-21 DIAGNOSIS — L89134 Pressure ulcer of right lower back, stage 4: Secondary | ICD-10-CM | POA: Diagnosis not present

## 2018-06-21 DIAGNOSIS — G8250 Quadriplegia, unspecified: Secondary | ICD-10-CM | POA: Diagnosis not present

## 2018-06-21 DIAGNOSIS — E46 Unspecified protein-calorie malnutrition: Secondary | ICD-10-CM | POA: Diagnosis not present

## 2018-06-21 DIAGNOSIS — R1311 Dysphagia, oral phase: Secondary | ICD-10-CM | POA: Diagnosis not present

## 2018-06-21 DIAGNOSIS — R52 Pain, unspecified: Secondary | ICD-10-CM | POA: Diagnosis not present

## 2018-06-21 DIAGNOSIS — L89144 Pressure ulcer of left lower back, stage 4: Secondary | ICD-10-CM | POA: Diagnosis not present

## 2018-06-21 DIAGNOSIS — F209 Schizophrenia, unspecified: Secondary | ICD-10-CM | POA: Diagnosis not present

## 2018-06-26 DIAGNOSIS — G8250 Quadriplegia, unspecified: Secondary | ICD-10-CM | POA: Diagnosis not present

## 2018-06-26 DIAGNOSIS — R1311 Dysphagia, oral phase: Secondary | ICD-10-CM | POA: Diagnosis not present

## 2018-06-26 DIAGNOSIS — F209 Schizophrenia, unspecified: Secondary | ICD-10-CM | POA: Diagnosis not present

## 2018-06-26 DIAGNOSIS — R52 Pain, unspecified: Secondary | ICD-10-CM | POA: Diagnosis not present

## 2018-06-26 DIAGNOSIS — E46 Unspecified protein-calorie malnutrition: Secondary | ICD-10-CM | POA: Diagnosis not present

## 2018-06-28 DIAGNOSIS — L89134 Pressure ulcer of right lower back, stage 4: Secondary | ICD-10-CM | POA: Diagnosis not present

## 2018-06-28 DIAGNOSIS — G8250 Quadriplegia, unspecified: Secondary | ICD-10-CM | POA: Diagnosis not present

## 2018-06-28 DIAGNOSIS — R52 Pain, unspecified: Secondary | ICD-10-CM | POA: Diagnosis not present

## 2018-06-28 DIAGNOSIS — R1311 Dysphagia, oral phase: Secondary | ICD-10-CM | POA: Diagnosis not present

## 2018-06-28 DIAGNOSIS — E46 Unspecified protein-calorie malnutrition: Secondary | ICD-10-CM | POA: Diagnosis not present

## 2018-06-28 DIAGNOSIS — F209 Schizophrenia, unspecified: Secondary | ICD-10-CM | POA: Diagnosis not present

## 2018-06-28 DIAGNOSIS — L97313 Non-pressure chronic ulcer of right ankle with necrosis of muscle: Secondary | ICD-10-CM | POA: Diagnosis not present

## 2018-06-29 DIAGNOSIS — G8250 Quadriplegia, unspecified: Secondary | ICD-10-CM | POA: Diagnosis not present

## 2018-06-29 DIAGNOSIS — E46 Unspecified protein-calorie malnutrition: Secondary | ICD-10-CM | POA: Diagnosis not present

## 2018-06-29 DIAGNOSIS — F209 Schizophrenia, unspecified: Secondary | ICD-10-CM | POA: Diagnosis not present

## 2018-06-29 DIAGNOSIS — R52 Pain, unspecified: Secondary | ICD-10-CM | POA: Diagnosis not present

## 2018-06-29 DIAGNOSIS — R1311 Dysphagia, oral phase: Secondary | ICD-10-CM | POA: Diagnosis not present

## 2018-07-01 DIAGNOSIS — F209 Schizophrenia, unspecified: Secondary | ICD-10-CM | POA: Diagnosis not present

## 2018-07-01 DIAGNOSIS — G8250 Quadriplegia, unspecified: Secondary | ICD-10-CM | POA: Diagnosis not present

## 2018-07-01 DIAGNOSIS — R1311 Dysphagia, oral phase: Secondary | ICD-10-CM | POA: Diagnosis not present

## 2018-07-01 DIAGNOSIS — R52 Pain, unspecified: Secondary | ICD-10-CM | POA: Diagnosis not present

## 2018-07-01 DIAGNOSIS — E46 Unspecified protein-calorie malnutrition: Secondary | ICD-10-CM | POA: Diagnosis not present

## 2018-07-03 DIAGNOSIS — R52 Pain, unspecified: Secondary | ICD-10-CM | POA: Diagnosis not present

## 2018-07-03 DIAGNOSIS — E46 Unspecified protein-calorie malnutrition: Secondary | ICD-10-CM | POA: Diagnosis not present

## 2018-07-03 DIAGNOSIS — R1311 Dysphagia, oral phase: Secondary | ICD-10-CM | POA: Diagnosis not present

## 2018-07-03 DIAGNOSIS — F209 Schizophrenia, unspecified: Secondary | ICD-10-CM | POA: Diagnosis not present

## 2018-07-03 DIAGNOSIS — G8250 Quadriplegia, unspecified: Secondary | ICD-10-CM | POA: Diagnosis not present

## 2018-07-04 DIAGNOSIS — F209 Schizophrenia, unspecified: Secondary | ICD-10-CM | POA: Diagnosis not present

## 2018-07-04 DIAGNOSIS — G8250 Quadriplegia, unspecified: Secondary | ICD-10-CM | POA: Diagnosis not present

## 2018-07-04 DIAGNOSIS — E46 Unspecified protein-calorie malnutrition: Secondary | ICD-10-CM | POA: Diagnosis not present

## 2018-07-04 DIAGNOSIS — R52 Pain, unspecified: Secondary | ICD-10-CM | POA: Diagnosis not present

## 2018-07-04 DIAGNOSIS — R1311 Dysphagia, oral phase: Secondary | ICD-10-CM | POA: Diagnosis not present

## 2018-07-05 DIAGNOSIS — F209 Schizophrenia, unspecified: Secondary | ICD-10-CM | POA: Diagnosis not present

## 2018-07-05 DIAGNOSIS — G8250 Quadriplegia, unspecified: Secondary | ICD-10-CM | POA: Diagnosis not present

## 2018-07-05 DIAGNOSIS — R1311 Dysphagia, oral phase: Secondary | ICD-10-CM | POA: Diagnosis not present

## 2018-07-05 DIAGNOSIS — E46 Unspecified protein-calorie malnutrition: Secondary | ICD-10-CM | POA: Diagnosis not present

## 2018-07-05 DIAGNOSIS — R52 Pain, unspecified: Secondary | ICD-10-CM | POA: Diagnosis not present

## 2018-07-05 DIAGNOSIS — L97313 Non-pressure chronic ulcer of right ankle with necrosis of muscle: Secondary | ICD-10-CM | POA: Diagnosis not present

## 2018-07-05 DIAGNOSIS — L89890 Pressure ulcer of other site, unstageable: Secondary | ICD-10-CM | POA: Diagnosis not present

## 2018-07-10 DIAGNOSIS — F209 Schizophrenia, unspecified: Secondary | ICD-10-CM | POA: Diagnosis not present

## 2018-07-10 DIAGNOSIS — G8250 Quadriplegia, unspecified: Secondary | ICD-10-CM | POA: Diagnosis not present

## 2018-07-10 DIAGNOSIS — R1311 Dysphagia, oral phase: Secondary | ICD-10-CM | POA: Diagnosis not present

## 2018-07-10 DIAGNOSIS — R52 Pain, unspecified: Secondary | ICD-10-CM | POA: Diagnosis not present

## 2018-07-10 DIAGNOSIS — E46 Unspecified protein-calorie malnutrition: Secondary | ICD-10-CM | POA: Diagnosis not present

## 2018-07-12 DIAGNOSIS — F209 Schizophrenia, unspecified: Secondary | ICD-10-CM | POA: Diagnosis not present

## 2018-07-12 DIAGNOSIS — G8250 Quadriplegia, unspecified: Secondary | ICD-10-CM | POA: Diagnosis not present

## 2018-07-12 DIAGNOSIS — R1311 Dysphagia, oral phase: Secondary | ICD-10-CM | POA: Diagnosis not present

## 2018-07-12 DIAGNOSIS — R52 Pain, unspecified: Secondary | ICD-10-CM | POA: Diagnosis not present

## 2018-07-12 DIAGNOSIS — E46 Unspecified protein-calorie malnutrition: Secondary | ICD-10-CM | POA: Diagnosis not present

## 2018-07-12 DIAGNOSIS — L89134 Pressure ulcer of right lower back, stage 4: Secondary | ICD-10-CM | POA: Diagnosis not present

## 2018-07-12 DIAGNOSIS — L89220 Pressure ulcer of left hip, unstageable: Secondary | ICD-10-CM | POA: Diagnosis not present

## 2018-07-12 DIAGNOSIS — L97313 Non-pressure chronic ulcer of right ankle with necrosis of muscle: Secondary | ICD-10-CM | POA: Diagnosis not present

## 2018-07-13 DIAGNOSIS — E46 Unspecified protein-calorie malnutrition: Secondary | ICD-10-CM | POA: Diagnosis not present

## 2018-07-13 DIAGNOSIS — S14122D Central cord syndrome at C2 level of cervical spinal cord, subsequent encounter: Secondary | ICD-10-CM | POA: Diagnosis not present

## 2018-07-13 DIAGNOSIS — R627 Adult failure to thrive: Secondary | ICD-10-CM | POA: Diagnosis not present

## 2018-07-13 DIAGNOSIS — R1311 Dysphagia, oral phase: Secondary | ICD-10-CM | POA: Diagnosis not present

## 2018-07-13 DIAGNOSIS — R52 Pain, unspecified: Secondary | ICD-10-CM | POA: Diagnosis not present

## 2018-07-13 DIAGNOSIS — G8250 Quadriplegia, unspecified: Secondary | ICD-10-CM | POA: Diagnosis not present

## 2018-07-13 DIAGNOSIS — F209 Schizophrenia, unspecified: Secondary | ICD-10-CM | POA: Diagnosis not present

## 2018-07-17 DIAGNOSIS — G8250 Quadriplegia, unspecified: Secondary | ICD-10-CM | POA: Diagnosis not present

## 2018-07-17 DIAGNOSIS — R52 Pain, unspecified: Secondary | ICD-10-CM | POA: Diagnosis not present

## 2018-07-17 DIAGNOSIS — R1311 Dysphagia, oral phase: Secondary | ICD-10-CM | POA: Diagnosis not present

## 2018-07-17 DIAGNOSIS — F209 Schizophrenia, unspecified: Secondary | ICD-10-CM | POA: Diagnosis not present

## 2018-07-17 DIAGNOSIS — E46 Unspecified protein-calorie malnutrition: Secondary | ICD-10-CM | POA: Diagnosis not present

## 2018-07-19 DIAGNOSIS — R1311 Dysphagia, oral phase: Secondary | ICD-10-CM | POA: Diagnosis not present

## 2018-07-19 DIAGNOSIS — G8250 Quadriplegia, unspecified: Secondary | ICD-10-CM | POA: Diagnosis not present

## 2018-07-19 DIAGNOSIS — L97313 Non-pressure chronic ulcer of right ankle with necrosis of muscle: Secondary | ICD-10-CM | POA: Diagnosis not present

## 2018-07-19 DIAGNOSIS — R52 Pain, unspecified: Secondary | ICD-10-CM | POA: Diagnosis not present

## 2018-07-19 DIAGNOSIS — E46 Unspecified protein-calorie malnutrition: Secondary | ICD-10-CM | POA: Diagnosis not present

## 2018-07-19 DIAGNOSIS — L89144 Pressure ulcer of left lower back, stage 4: Secondary | ICD-10-CM | POA: Diagnosis not present

## 2018-07-19 DIAGNOSIS — L89220 Pressure ulcer of left hip, unstageable: Secondary | ICD-10-CM | POA: Diagnosis not present

## 2018-07-19 DIAGNOSIS — F209 Schizophrenia, unspecified: Secondary | ICD-10-CM | POA: Diagnosis not present

## 2018-07-20 DIAGNOSIS — R52 Pain, unspecified: Secondary | ICD-10-CM | POA: Diagnosis not present

## 2018-07-20 DIAGNOSIS — R1311 Dysphagia, oral phase: Secondary | ICD-10-CM | POA: Diagnosis not present

## 2018-07-20 DIAGNOSIS — F209 Schizophrenia, unspecified: Secondary | ICD-10-CM | POA: Diagnosis not present

## 2018-07-20 DIAGNOSIS — G8250 Quadriplegia, unspecified: Secondary | ICD-10-CM | POA: Diagnosis not present

## 2018-07-20 DIAGNOSIS — E46 Unspecified protein-calorie malnutrition: Secondary | ICD-10-CM | POA: Diagnosis not present

## 2018-07-21 DIAGNOSIS — F209 Schizophrenia, unspecified: Secondary | ICD-10-CM | POA: Diagnosis not present

## 2018-07-21 DIAGNOSIS — R52 Pain, unspecified: Secondary | ICD-10-CM | POA: Diagnosis not present

## 2018-07-21 DIAGNOSIS — R1311 Dysphagia, oral phase: Secondary | ICD-10-CM | POA: Diagnosis not present

## 2018-07-21 DIAGNOSIS — E46 Unspecified protein-calorie malnutrition: Secondary | ICD-10-CM | POA: Diagnosis not present

## 2018-07-21 DIAGNOSIS — G8250 Quadriplegia, unspecified: Secondary | ICD-10-CM | POA: Diagnosis not present

## 2018-07-24 DIAGNOSIS — R1311 Dysphagia, oral phase: Secondary | ICD-10-CM | POA: Diagnosis not present

## 2018-07-24 DIAGNOSIS — E46 Unspecified protein-calorie malnutrition: Secondary | ICD-10-CM | POA: Diagnosis not present

## 2018-07-24 DIAGNOSIS — F209 Schizophrenia, unspecified: Secondary | ICD-10-CM | POA: Diagnosis not present

## 2018-07-24 DIAGNOSIS — G8250 Quadriplegia, unspecified: Secondary | ICD-10-CM | POA: Diagnosis not present

## 2018-07-24 DIAGNOSIS — R52 Pain, unspecified: Secondary | ICD-10-CM | POA: Diagnosis not present

## 2018-07-25 DIAGNOSIS — R0902 Hypoxemia: Secondary | ICD-10-CM | POA: Diagnosis not present

## 2018-07-25 DIAGNOSIS — R1311 Dysphagia, oral phase: Secondary | ICD-10-CM | POA: Diagnosis not present

## 2018-07-25 DIAGNOSIS — Z743 Need for continuous supervision: Secondary | ICD-10-CM | POA: Diagnosis not present

## 2018-07-25 DIAGNOSIS — R404 Transient alteration of awareness: Secondary | ICD-10-CM | POA: Diagnosis not present

## 2018-07-25 DIAGNOSIS — F209 Schizophrenia, unspecified: Secondary | ICD-10-CM | POA: Diagnosis not present

## 2018-07-25 DIAGNOSIS — G8250 Quadriplegia, unspecified: Secondary | ICD-10-CM | POA: Diagnosis not present

## 2018-07-25 DIAGNOSIS — R52 Pain, unspecified: Secondary | ICD-10-CM | POA: Diagnosis not present

## 2018-07-25 DIAGNOSIS — E46 Unspecified protein-calorie malnutrition: Secondary | ICD-10-CM | POA: Diagnosis not present

## 2018-07-26 DIAGNOSIS — R1311 Dysphagia, oral phase: Secondary | ICD-10-CM | POA: Diagnosis not present

## 2018-07-26 DIAGNOSIS — G8250 Quadriplegia, unspecified: Secondary | ICD-10-CM | POA: Diagnosis not present

## 2018-07-26 DIAGNOSIS — E46 Unspecified protein-calorie malnutrition: Secondary | ICD-10-CM | POA: Diagnosis not present

## 2018-07-26 DIAGNOSIS — R52 Pain, unspecified: Secondary | ICD-10-CM | POA: Diagnosis not present

## 2018-07-26 DIAGNOSIS — F209 Schizophrenia, unspecified: Secondary | ICD-10-CM | POA: Diagnosis not present

## 2018-07-27 DIAGNOSIS — R1311 Dysphagia, oral phase: Secondary | ICD-10-CM | POA: Diagnosis not present

## 2018-07-27 DIAGNOSIS — E46 Unspecified protein-calorie malnutrition: Secondary | ICD-10-CM | POA: Diagnosis not present

## 2018-07-27 DIAGNOSIS — R52 Pain, unspecified: Secondary | ICD-10-CM | POA: Diagnosis not present

## 2018-07-27 DIAGNOSIS — F209 Schizophrenia, unspecified: Secondary | ICD-10-CM | POA: Diagnosis not present

## 2018-07-27 DIAGNOSIS — G8250 Quadriplegia, unspecified: Secondary | ICD-10-CM | POA: Diagnosis not present

## 2018-07-28 DIAGNOSIS — E46 Unspecified protein-calorie malnutrition: Secondary | ICD-10-CM | POA: Diagnosis not present

## 2018-07-28 DIAGNOSIS — G8250 Quadriplegia, unspecified: Secondary | ICD-10-CM | POA: Diagnosis not present

## 2018-07-28 DIAGNOSIS — F209 Schizophrenia, unspecified: Secondary | ICD-10-CM | POA: Diagnosis not present

## 2018-07-28 DIAGNOSIS — R1311 Dysphagia, oral phase: Secondary | ICD-10-CM | POA: Diagnosis not present

## 2018-07-28 DIAGNOSIS — R52 Pain, unspecified: Secondary | ICD-10-CM | POA: Diagnosis not present

## 2018-07-29 DIAGNOSIS — F209 Schizophrenia, unspecified: Secondary | ICD-10-CM | POA: Diagnosis not present

## 2018-07-29 DIAGNOSIS — R52 Pain, unspecified: Secondary | ICD-10-CM | POA: Diagnosis not present

## 2018-07-29 DIAGNOSIS — R1311 Dysphagia, oral phase: Secondary | ICD-10-CM | POA: Diagnosis not present

## 2018-07-29 DIAGNOSIS — G8250 Quadriplegia, unspecified: Secondary | ICD-10-CM | POA: Diagnosis not present

## 2018-07-29 DIAGNOSIS — E46 Unspecified protein-calorie malnutrition: Secondary | ICD-10-CM | POA: Diagnosis not present

## 2018-07-30 DIAGNOSIS — G8250 Quadriplegia, unspecified: Secondary | ICD-10-CM | POA: Diagnosis not present

## 2018-07-30 DIAGNOSIS — F209 Schizophrenia, unspecified: Secondary | ICD-10-CM | POA: Diagnosis not present

## 2018-07-30 DIAGNOSIS — R1311 Dysphagia, oral phase: Secondary | ICD-10-CM | POA: Diagnosis not present

## 2018-07-30 DIAGNOSIS — R52 Pain, unspecified: Secondary | ICD-10-CM | POA: Diagnosis not present

## 2018-07-30 DIAGNOSIS — E46 Unspecified protein-calorie malnutrition: Secondary | ICD-10-CM | POA: Diagnosis not present

## 2018-07-31 DIAGNOSIS — R1311 Dysphagia, oral phase: Secondary | ICD-10-CM | POA: Diagnosis not present

## 2018-07-31 DIAGNOSIS — E46 Unspecified protein-calorie malnutrition: Secondary | ICD-10-CM | POA: Diagnosis not present

## 2018-07-31 DIAGNOSIS — R52 Pain, unspecified: Secondary | ICD-10-CM | POA: Diagnosis not present

## 2018-07-31 DIAGNOSIS — G8250 Quadriplegia, unspecified: Secondary | ICD-10-CM | POA: Diagnosis not present

## 2018-07-31 DIAGNOSIS — F209 Schizophrenia, unspecified: Secondary | ICD-10-CM | POA: Diagnosis not present

## 2018-08-01 DIAGNOSIS — G8250 Quadriplegia, unspecified: Secondary | ICD-10-CM | POA: Diagnosis not present

## 2018-08-01 DIAGNOSIS — R52 Pain, unspecified: Secondary | ICD-10-CM | POA: Diagnosis not present

## 2018-08-01 DIAGNOSIS — R1311 Dysphagia, oral phase: Secondary | ICD-10-CM | POA: Diagnosis not present

## 2018-08-01 DIAGNOSIS — E46 Unspecified protein-calorie malnutrition: Secondary | ICD-10-CM | POA: Diagnosis not present

## 2018-08-01 DIAGNOSIS — F209 Schizophrenia, unspecified: Secondary | ICD-10-CM | POA: Diagnosis not present

## 2018-08-02 DIAGNOSIS — R52 Pain, unspecified: Secondary | ICD-10-CM | POA: Diagnosis not present

## 2018-08-02 DIAGNOSIS — F209 Schizophrenia, unspecified: Secondary | ICD-10-CM | POA: Diagnosis not present

## 2018-08-02 DIAGNOSIS — R1311 Dysphagia, oral phase: Secondary | ICD-10-CM | POA: Diagnosis not present

## 2018-08-02 DIAGNOSIS — E46 Unspecified protein-calorie malnutrition: Secondary | ICD-10-CM | POA: Diagnosis not present

## 2018-08-02 DIAGNOSIS — G8250 Quadriplegia, unspecified: Secondary | ICD-10-CM | POA: Diagnosis not present

## 2018-08-03 DIAGNOSIS — E46 Unspecified protein-calorie malnutrition: Secondary | ICD-10-CM | POA: Diagnosis not present

## 2018-08-03 DIAGNOSIS — F209 Schizophrenia, unspecified: Secondary | ICD-10-CM | POA: Diagnosis not present

## 2018-08-03 DIAGNOSIS — R52 Pain, unspecified: Secondary | ICD-10-CM | POA: Diagnosis not present

## 2018-08-03 DIAGNOSIS — G8250 Quadriplegia, unspecified: Secondary | ICD-10-CM | POA: Diagnosis not present

## 2018-08-03 DIAGNOSIS — R1311 Dysphagia, oral phase: Secondary | ICD-10-CM | POA: Diagnosis not present

## 2018-08-04 DIAGNOSIS — G8250 Quadriplegia, unspecified: Secondary | ICD-10-CM | POA: Diagnosis not present

## 2018-08-04 DIAGNOSIS — E46 Unspecified protein-calorie malnutrition: Secondary | ICD-10-CM | POA: Diagnosis not present

## 2018-08-04 DIAGNOSIS — R52 Pain, unspecified: Secondary | ICD-10-CM | POA: Diagnosis not present

## 2018-08-04 DIAGNOSIS — F209 Schizophrenia, unspecified: Secondary | ICD-10-CM | POA: Diagnosis not present

## 2018-08-04 DIAGNOSIS — R1311 Dysphagia, oral phase: Secondary | ICD-10-CM | POA: Diagnosis not present

## 2018-08-05 DIAGNOSIS — R52 Pain, unspecified: Secondary | ICD-10-CM | POA: Diagnosis not present

## 2018-08-05 DIAGNOSIS — E46 Unspecified protein-calorie malnutrition: Secondary | ICD-10-CM | POA: Diagnosis not present

## 2018-08-05 DIAGNOSIS — G8250 Quadriplegia, unspecified: Secondary | ICD-10-CM | POA: Diagnosis not present

## 2018-08-05 DIAGNOSIS — R1311 Dysphagia, oral phase: Secondary | ICD-10-CM | POA: Diagnosis not present

## 2018-08-05 DIAGNOSIS — F209 Schizophrenia, unspecified: Secondary | ICD-10-CM | POA: Diagnosis not present

## 2018-08-06 DIAGNOSIS — G8250 Quadriplegia, unspecified: Secondary | ICD-10-CM | POA: Diagnosis not present

## 2018-08-06 DIAGNOSIS — F209 Schizophrenia, unspecified: Secondary | ICD-10-CM | POA: Diagnosis not present

## 2018-08-06 DIAGNOSIS — R1311 Dysphagia, oral phase: Secondary | ICD-10-CM | POA: Diagnosis not present

## 2018-08-06 DIAGNOSIS — R52 Pain, unspecified: Secondary | ICD-10-CM | POA: Diagnosis not present

## 2018-08-06 DIAGNOSIS — E46 Unspecified protein-calorie malnutrition: Secondary | ICD-10-CM | POA: Diagnosis not present

## 2018-08-07 DIAGNOSIS — R1311 Dysphagia, oral phase: Secondary | ICD-10-CM | POA: Diagnosis not present

## 2018-08-07 DIAGNOSIS — F209 Schizophrenia, unspecified: Secondary | ICD-10-CM | POA: Diagnosis not present

## 2018-08-07 DIAGNOSIS — R52 Pain, unspecified: Secondary | ICD-10-CM | POA: Diagnosis not present

## 2018-08-07 DIAGNOSIS — E46 Unspecified protein-calorie malnutrition: Secondary | ICD-10-CM | POA: Diagnosis not present

## 2018-08-07 DIAGNOSIS — G8250 Quadriplegia, unspecified: Secondary | ICD-10-CM | POA: Diagnosis not present

## 2018-08-08 DIAGNOSIS — R1311 Dysphagia, oral phase: Secondary | ICD-10-CM | POA: Diagnosis not present

## 2018-08-08 DIAGNOSIS — G8250 Quadriplegia, unspecified: Secondary | ICD-10-CM | POA: Diagnosis not present

## 2018-08-08 DIAGNOSIS — F209 Schizophrenia, unspecified: Secondary | ICD-10-CM | POA: Diagnosis not present

## 2018-08-08 DIAGNOSIS — E46 Unspecified protein-calorie malnutrition: Secondary | ICD-10-CM | POA: Diagnosis not present

## 2018-08-08 DIAGNOSIS — R52 Pain, unspecified: Secondary | ICD-10-CM | POA: Diagnosis not present

## 2018-08-09 DIAGNOSIS — R52 Pain, unspecified: Secondary | ICD-10-CM | POA: Diagnosis not present

## 2018-08-09 DIAGNOSIS — R1311 Dysphagia, oral phase: Secondary | ICD-10-CM | POA: Diagnosis not present

## 2018-08-09 DIAGNOSIS — E46 Unspecified protein-calorie malnutrition: Secondary | ICD-10-CM | POA: Diagnosis not present

## 2018-08-09 DIAGNOSIS — G8250 Quadriplegia, unspecified: Secondary | ICD-10-CM | POA: Diagnosis not present

## 2018-08-09 DIAGNOSIS — F209 Schizophrenia, unspecified: Secondary | ICD-10-CM | POA: Diagnosis not present

## 2018-08-10 DIAGNOSIS — R52 Pain, unspecified: Secondary | ICD-10-CM | POA: Diagnosis not present

## 2018-08-10 DIAGNOSIS — G8250 Quadriplegia, unspecified: Secondary | ICD-10-CM | POA: Diagnosis not present

## 2018-08-10 DIAGNOSIS — R1311 Dysphagia, oral phase: Secondary | ICD-10-CM | POA: Diagnosis not present

## 2018-08-10 DIAGNOSIS — F209 Schizophrenia, unspecified: Secondary | ICD-10-CM | POA: Diagnosis not present

## 2018-08-10 DIAGNOSIS — E46 Unspecified protein-calorie malnutrition: Secondary | ICD-10-CM | POA: Diagnosis not present

## 2018-08-11 ENCOUNTER — Ambulatory Visit: Payer: Medicare Other | Admitting: Gastroenterology

## 2018-08-11 DIAGNOSIS — E46 Unspecified protein-calorie malnutrition: Secondary | ICD-10-CM | POA: Diagnosis not present

## 2018-08-11 DIAGNOSIS — R52 Pain, unspecified: Secondary | ICD-10-CM | POA: Diagnosis not present

## 2018-08-11 DIAGNOSIS — F209 Schizophrenia, unspecified: Secondary | ICD-10-CM | POA: Diagnosis not present

## 2018-08-11 DIAGNOSIS — G8250 Quadriplegia, unspecified: Secondary | ICD-10-CM | POA: Diagnosis not present

## 2018-08-11 DIAGNOSIS — R1311 Dysphagia, oral phase: Secondary | ICD-10-CM | POA: Diagnosis not present

## 2018-08-12 DIAGNOSIS — R1311 Dysphagia, oral phase: Secondary | ICD-10-CM | POA: Diagnosis not present

## 2018-08-12 DIAGNOSIS — G8250 Quadriplegia, unspecified: Secondary | ICD-10-CM | POA: Diagnosis not present

## 2018-08-12 DIAGNOSIS — F209 Schizophrenia, unspecified: Secondary | ICD-10-CM | POA: Diagnosis not present

## 2018-08-12 DIAGNOSIS — R52 Pain, unspecified: Secondary | ICD-10-CM | POA: Diagnosis not present

## 2018-08-12 DIAGNOSIS — E46 Unspecified protein-calorie malnutrition: Secondary | ICD-10-CM | POA: Diagnosis not present

## 2018-08-13 DIAGNOSIS — F209 Schizophrenia, unspecified: Secondary | ICD-10-CM | POA: Diagnosis not present

## 2018-08-13 DIAGNOSIS — R52 Pain, unspecified: Secondary | ICD-10-CM | POA: Diagnosis not present

## 2018-08-13 DIAGNOSIS — E46 Unspecified protein-calorie malnutrition: Secondary | ICD-10-CM | POA: Diagnosis not present

## 2018-08-13 DIAGNOSIS — R1311 Dysphagia, oral phase: Secondary | ICD-10-CM | POA: Diagnosis not present

## 2018-08-13 DIAGNOSIS — G8250 Quadriplegia, unspecified: Secondary | ICD-10-CM | POA: Diagnosis not present

## 2018-08-14 DIAGNOSIS — G8250 Quadriplegia, unspecified: Secondary | ICD-10-CM | POA: Diagnosis not present

## 2018-08-14 DIAGNOSIS — F209 Schizophrenia, unspecified: Secondary | ICD-10-CM | POA: Diagnosis not present

## 2018-08-14 DIAGNOSIS — E46 Unspecified protein-calorie malnutrition: Secondary | ICD-10-CM | POA: Diagnosis not present

## 2018-08-14 DIAGNOSIS — R1311 Dysphagia, oral phase: Secondary | ICD-10-CM | POA: Diagnosis not present

## 2018-08-14 DIAGNOSIS — R52 Pain, unspecified: Secondary | ICD-10-CM | POA: Diagnosis not present

## 2018-08-15 DIAGNOSIS — E46 Unspecified protein-calorie malnutrition: Secondary | ICD-10-CM | POA: Diagnosis not present

## 2018-08-15 DIAGNOSIS — G8250 Quadriplegia, unspecified: Secondary | ICD-10-CM | POA: Diagnosis not present

## 2018-08-15 DIAGNOSIS — R1311 Dysphagia, oral phase: Secondary | ICD-10-CM | POA: Diagnosis not present

## 2018-08-15 DIAGNOSIS — R52 Pain, unspecified: Secondary | ICD-10-CM | POA: Diagnosis not present

## 2018-08-15 DIAGNOSIS — F209 Schizophrenia, unspecified: Secondary | ICD-10-CM | POA: Diagnosis not present

## 2018-08-16 DIAGNOSIS — R52 Pain, unspecified: Secondary | ICD-10-CM | POA: Diagnosis not present

## 2018-08-16 DIAGNOSIS — F209 Schizophrenia, unspecified: Secondary | ICD-10-CM | POA: Diagnosis not present

## 2018-08-16 DIAGNOSIS — R1311 Dysphagia, oral phase: Secondary | ICD-10-CM | POA: Diagnosis not present

## 2018-08-16 DIAGNOSIS — E46 Unspecified protein-calorie malnutrition: Secondary | ICD-10-CM | POA: Diagnosis not present

## 2018-08-16 DIAGNOSIS — G8250 Quadriplegia, unspecified: Secondary | ICD-10-CM | POA: Diagnosis not present

## 2018-08-17 DIAGNOSIS — E46 Unspecified protein-calorie malnutrition: Secondary | ICD-10-CM | POA: Diagnosis not present

## 2018-08-17 DIAGNOSIS — G8250 Quadriplegia, unspecified: Secondary | ICD-10-CM | POA: Diagnosis not present

## 2018-08-17 DIAGNOSIS — R1311 Dysphagia, oral phase: Secondary | ICD-10-CM | POA: Diagnosis not present

## 2018-08-17 DIAGNOSIS — F209 Schizophrenia, unspecified: Secondary | ICD-10-CM | POA: Diagnosis not present

## 2018-08-17 DIAGNOSIS — R52 Pain, unspecified: Secondary | ICD-10-CM | POA: Diagnosis not present

## 2018-08-18 DIAGNOSIS — F209 Schizophrenia, unspecified: Secondary | ICD-10-CM | POA: Diagnosis not present

## 2018-08-18 DIAGNOSIS — R1311 Dysphagia, oral phase: Secondary | ICD-10-CM | POA: Diagnosis not present

## 2018-08-18 DIAGNOSIS — E46 Unspecified protein-calorie malnutrition: Secondary | ICD-10-CM | POA: Diagnosis not present

## 2018-08-18 DIAGNOSIS — R52 Pain, unspecified: Secondary | ICD-10-CM | POA: Diagnosis not present

## 2018-08-18 DIAGNOSIS — G8250 Quadriplegia, unspecified: Secondary | ICD-10-CM | POA: Diagnosis not present

## 2018-08-19 DIAGNOSIS — R1311 Dysphagia, oral phase: Secondary | ICD-10-CM | POA: Diagnosis not present

## 2018-08-19 DIAGNOSIS — R52 Pain, unspecified: Secondary | ICD-10-CM | POA: Diagnosis not present

## 2018-08-19 DIAGNOSIS — E46 Unspecified protein-calorie malnutrition: Secondary | ICD-10-CM | POA: Diagnosis not present

## 2018-08-19 DIAGNOSIS — G8250 Quadriplegia, unspecified: Secondary | ICD-10-CM | POA: Diagnosis not present

## 2018-08-19 DIAGNOSIS — F209 Schizophrenia, unspecified: Secondary | ICD-10-CM | POA: Diagnosis not present

## 2018-09-20 DEATH — deceased

## 2018-10-03 IMAGING — US US EXTREM LOW VENOUS*L*
1 series · 14 of 24 positions shown · non-contrast
Comparison: None

CLINICAL DATA: Edema, pain, altered mental status

EXAM:
LEFT LOWER EXTREMITY VENOUS DOPPLER ULTRASOUND
TECHNIQUE: Gray-scale sonography with compression, as well as color and duplex
ultrasound, were performed to evaluate the deep venous system from
the level of the common femoral vein through the popliteal and
proximal calf veins.

[Series 1: us extrem low venous*left* · 0.08mm/px · 14 of 35 slices shown]
[im 1/35]
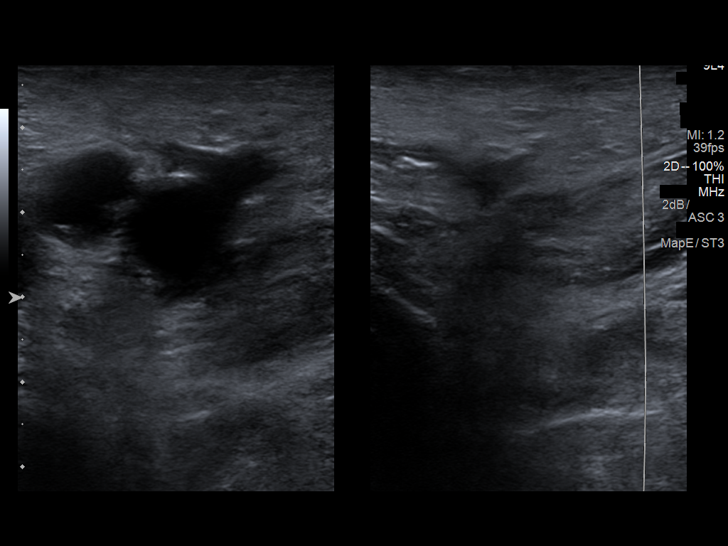
[im 3/35]
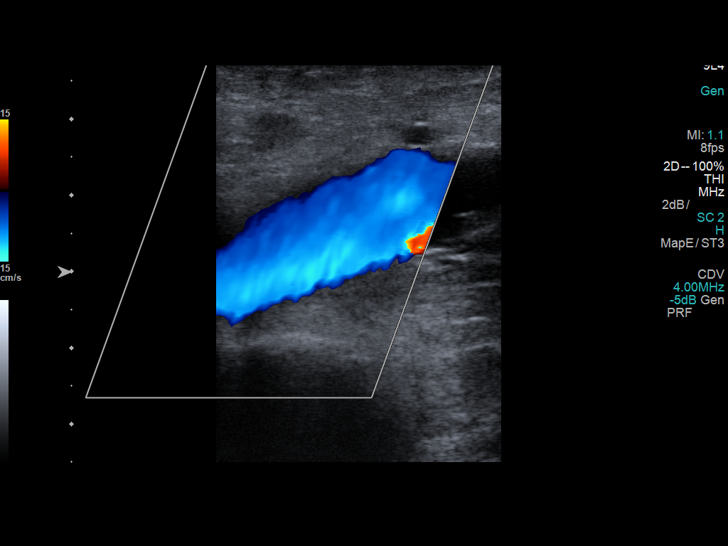
[im 6/35]
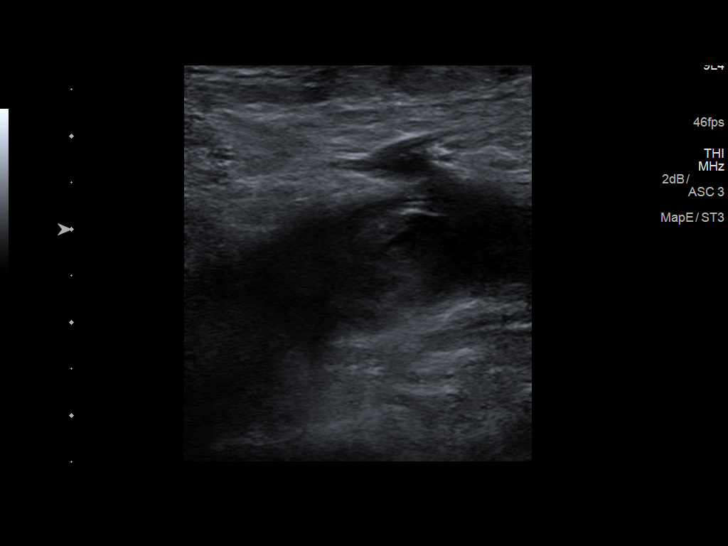
[im 9/35]
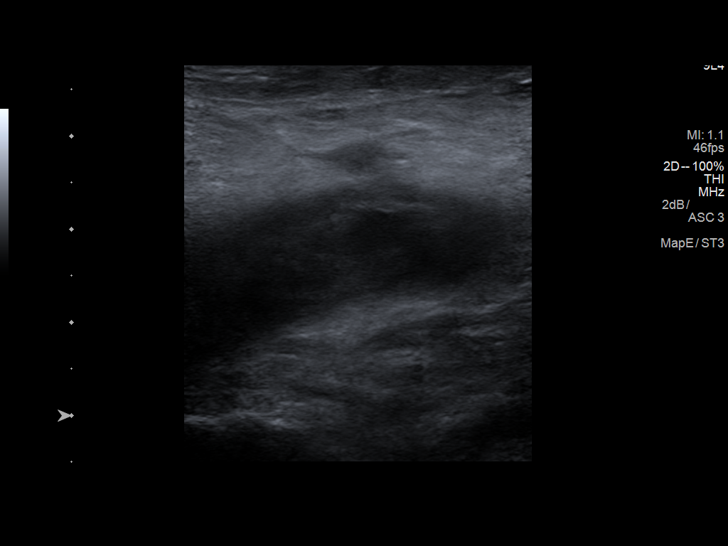
[im 11/35]
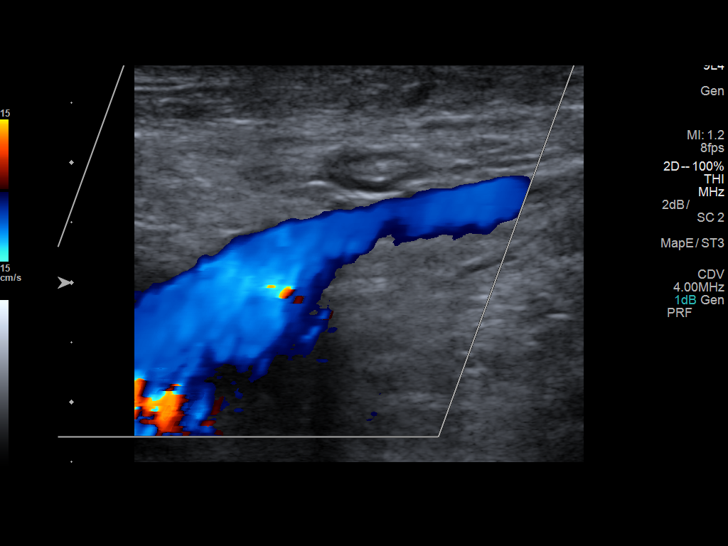
[im 14/35]
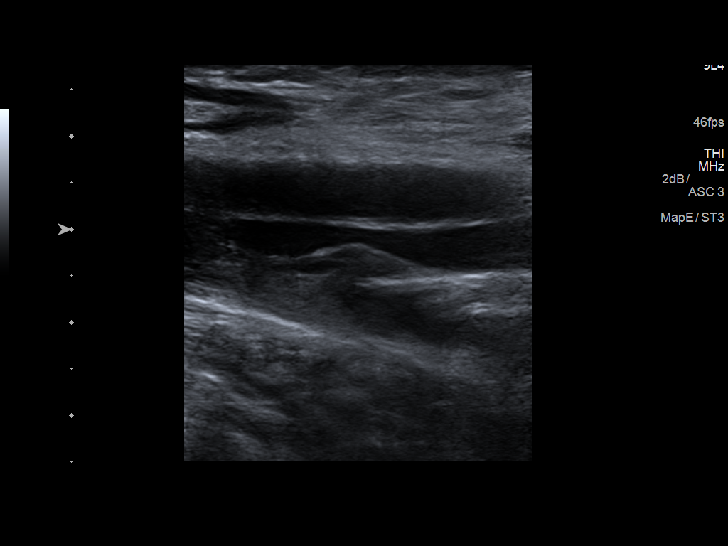
[im 17/35]
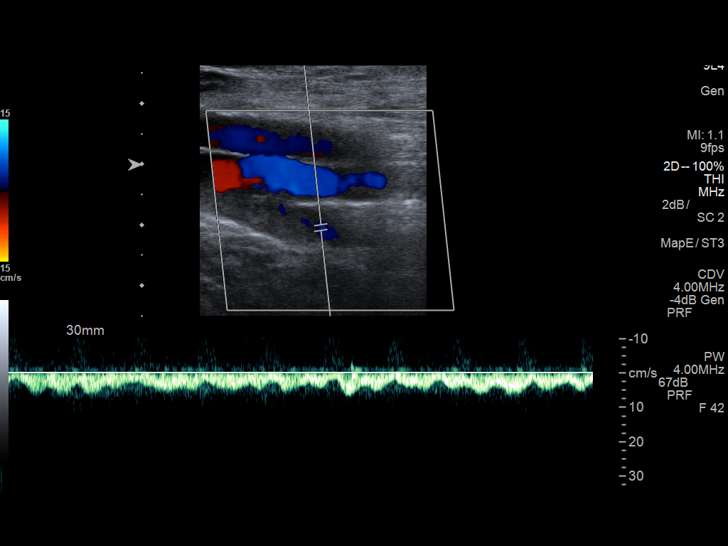
[im 18/35]
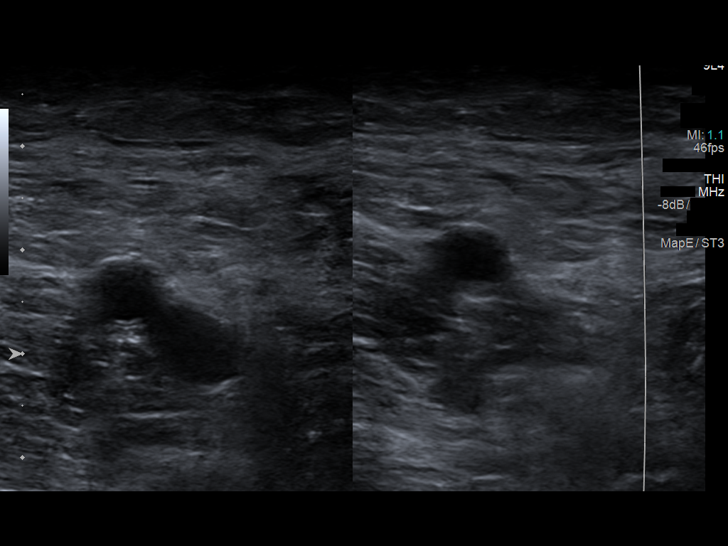
[im 21/35]
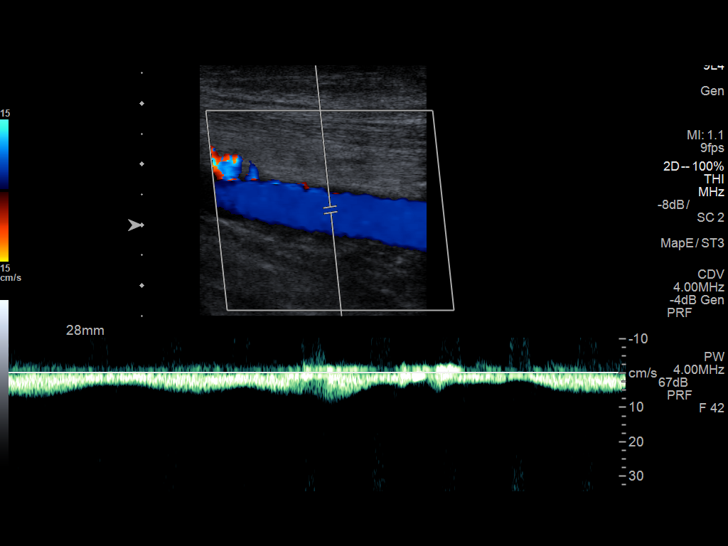
[im 24/35]
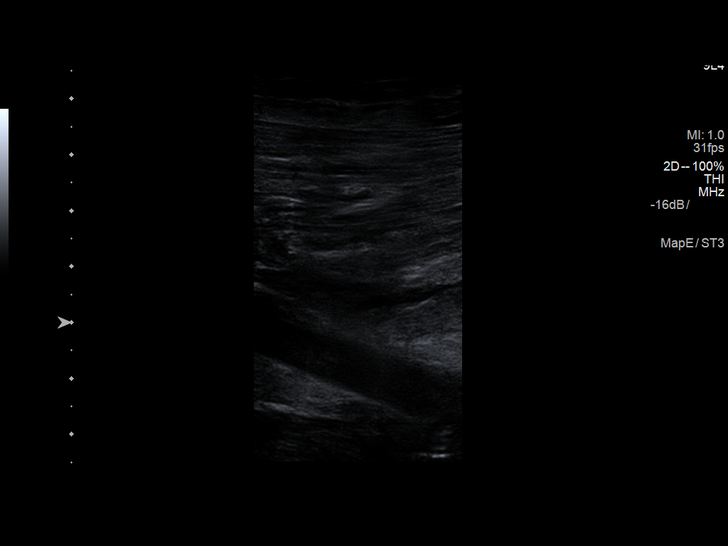
[im 27/35]
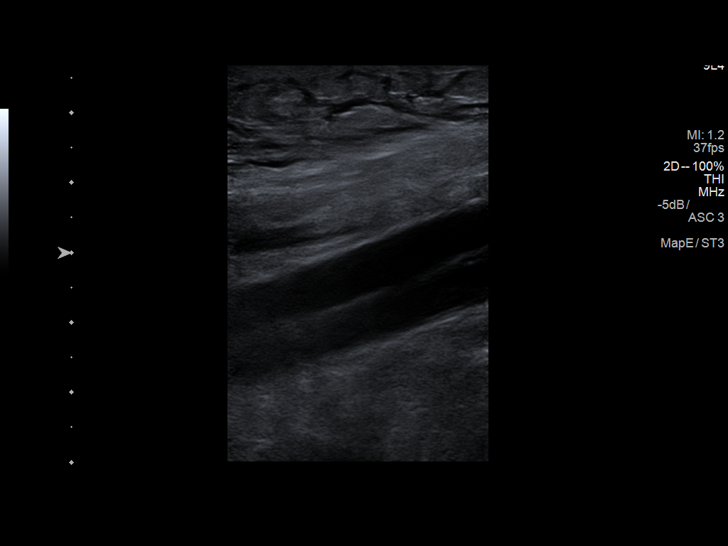
[im 29/35]
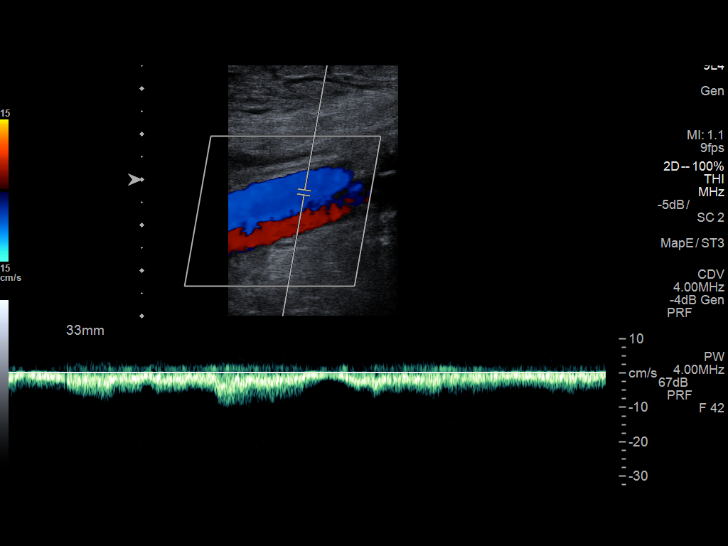
[im 32/35]
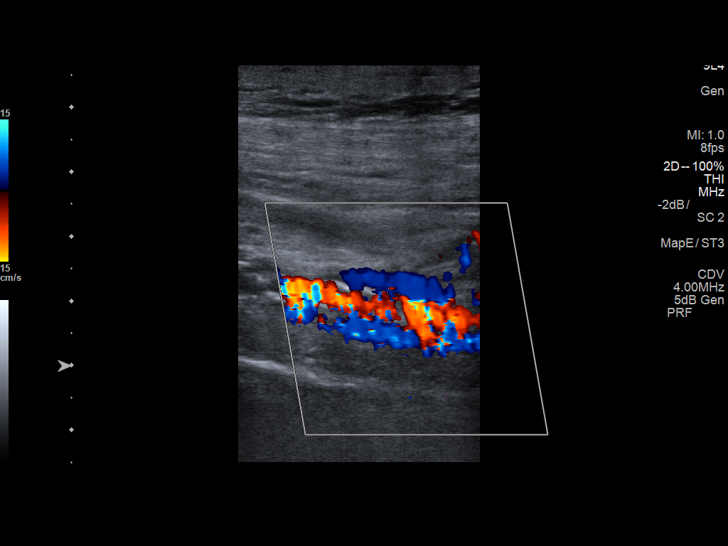
[im 35/35]
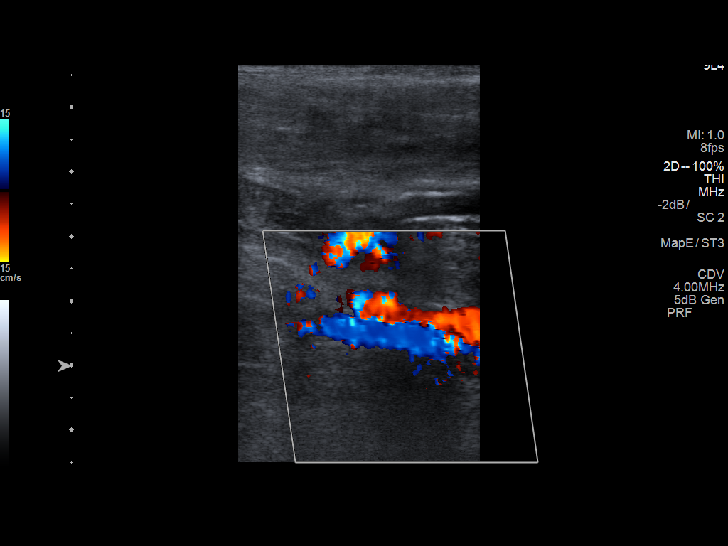

[14 of 24 positions shown; findings below may reference images not displayed]

FINDINGS: There is noncompressible occlusive thrombus in the left common
femoral vein extending across the saphenofemoral junction. There is
partially occlusive thrombus in the profunda vein. The femoral vein
however appears patent as is the popliteal vein and visualized calf
veins. There is subcutaneous edema in the calf. Survey views of the
contralateral common femoral vein are unremarkable.
IMPRESSION: 1. POSITIVE for left lower extremity DVT involving profunda femoral
vein and common femoral vein.

## 2019-06-12 IMAGING — CR DG CHEST 1V PORT
1 series · 1 of 1 positions shown · non-contrast
Comparison: None.

CLINICAL DATA: Hypotension.

EXAM:
PORTABLE CHEST 1 VIEW

[portable]
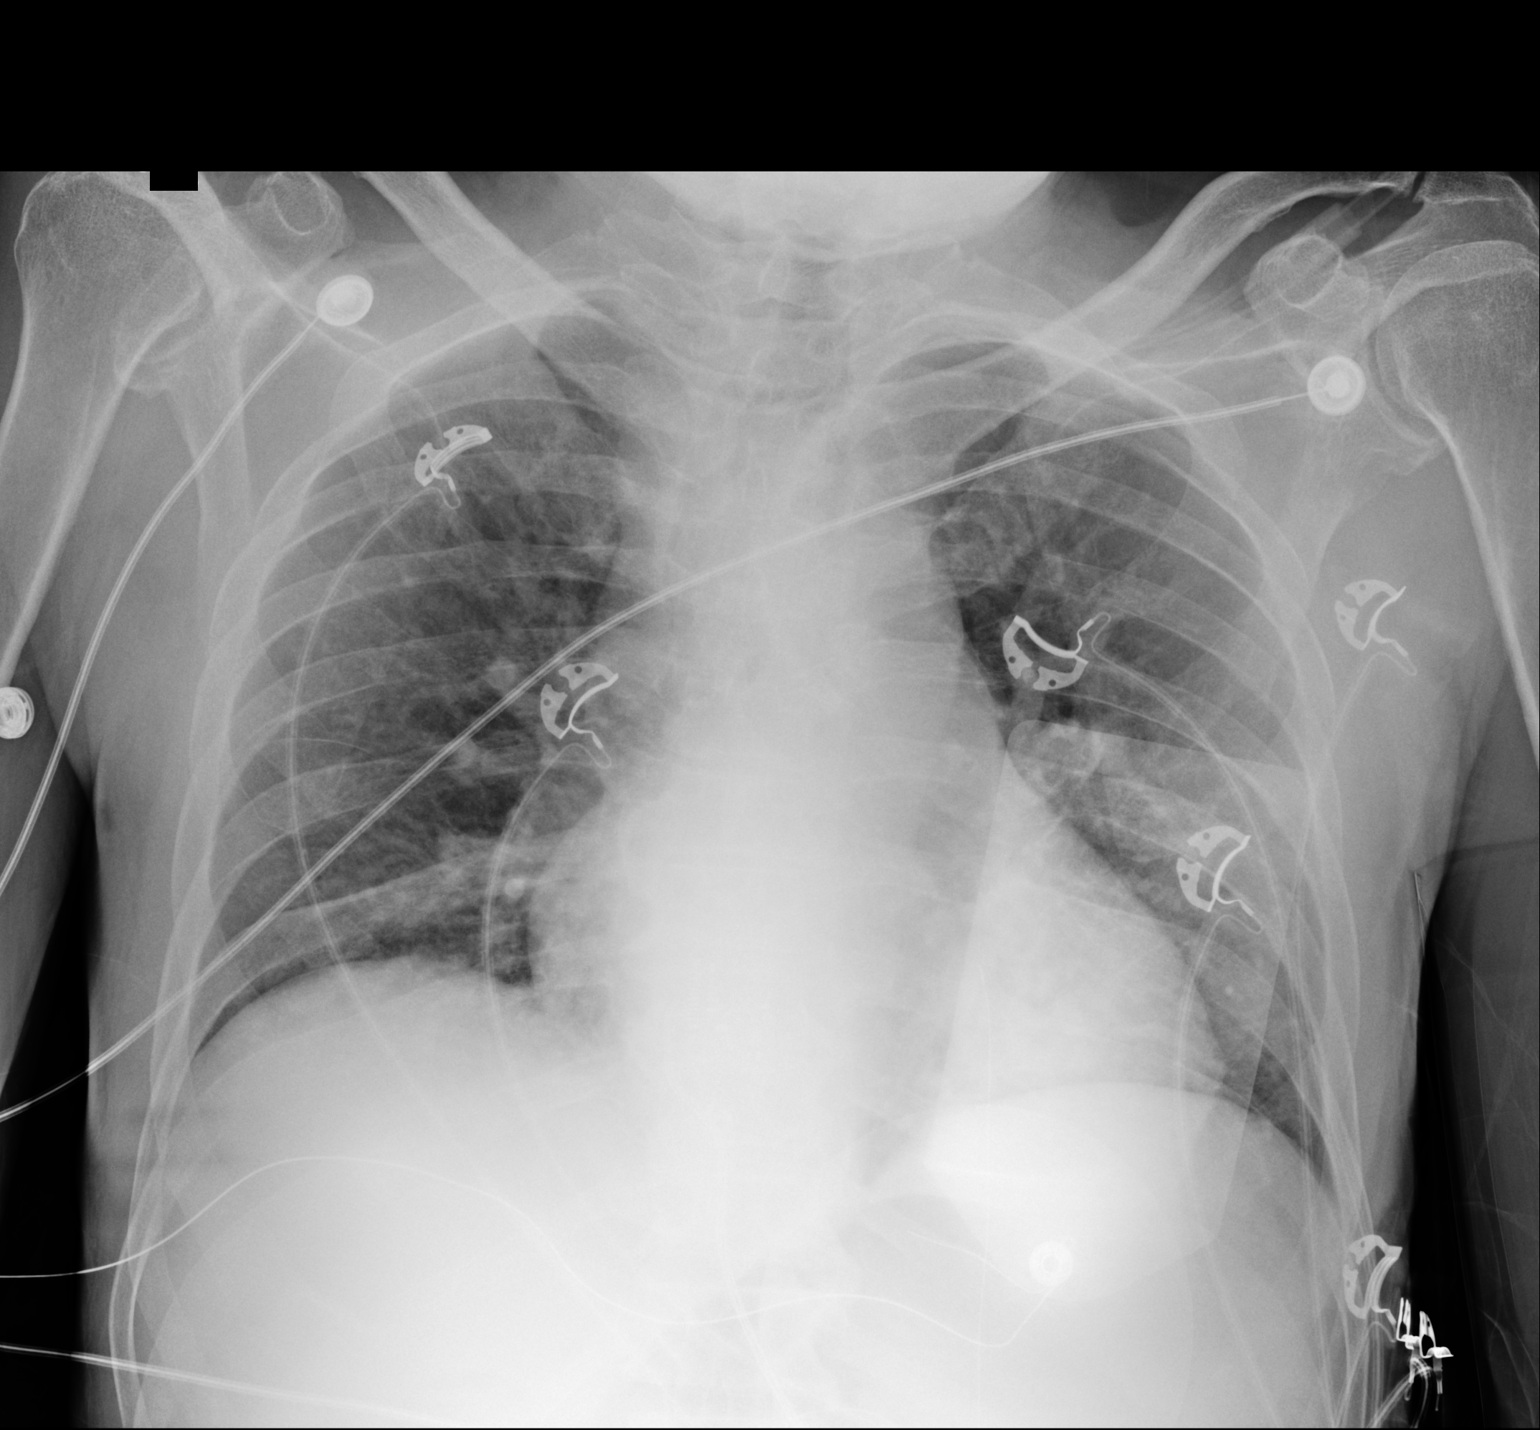

[1 of 1 positions shown; findings below may reference images not displayed]

FINDINGS: Mild cardiomegaly is noted. No pneumothorax or pleural effusion is
noted. Both lungs are clear. The visualized skeletal structures are
unremarkable.
IMPRESSION: No acute cardiopulmonary abnormality seen.

## 2019-06-13 IMAGING — CT CT CERVICAL SPINE W/O CM
3 of 4 series · 13 of 33 positions shown, 16 images · non-contrast
Comparison: None.

CLINICAL DATA: 71-year-old male with a history of fall and cervical
cord edema on MRI

EXAM:
CT CERVICAL SPINE WITHOUT CONTRAST
TECHNIQUE: Multidetector CT imaging of the cervical spine was performed without
intravenous contrast. Multiplanar CT image reconstructions were also
generated.

[Series 6: sag bone · sagittal · 0.35mm/px · 5 of 43 slices shown, 6 images]
[im 15/43  bone]
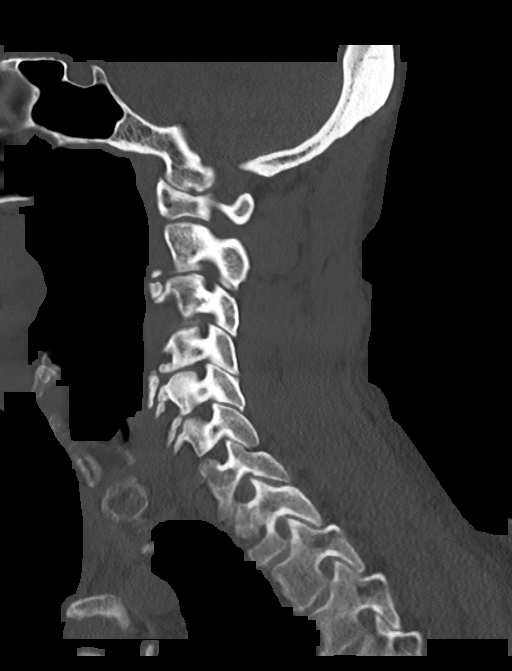
[im 18/43  bone]
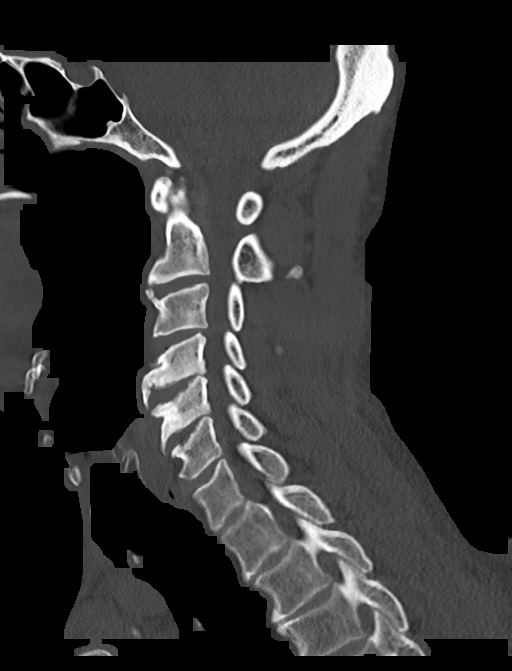
[im 22/43  soft-tissue]
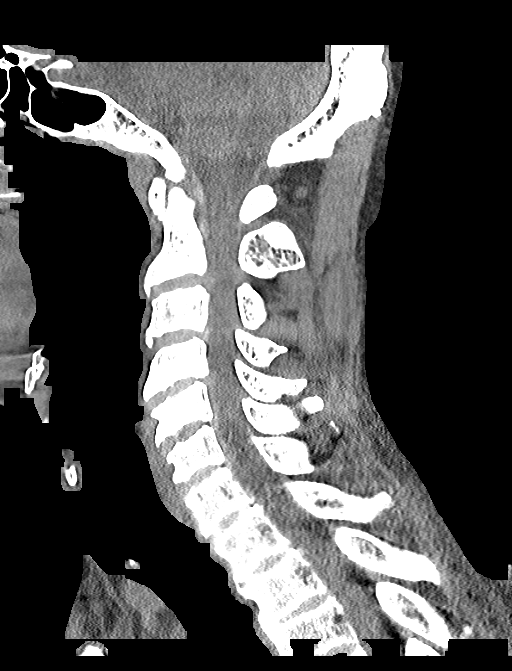
[im 22/43  bone]
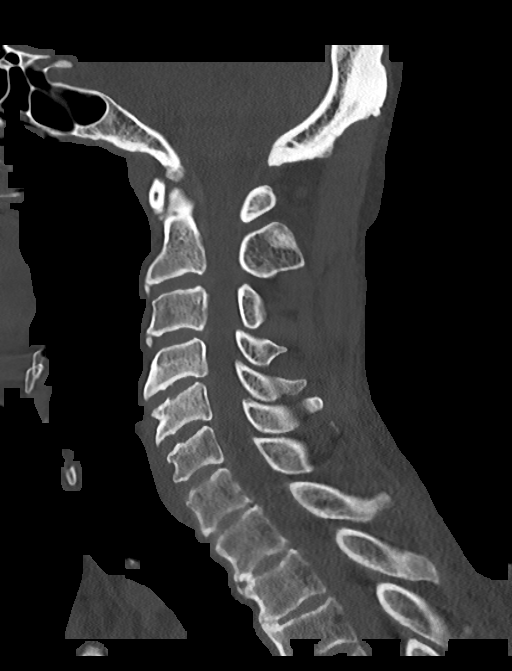
[im 25/43  bone]
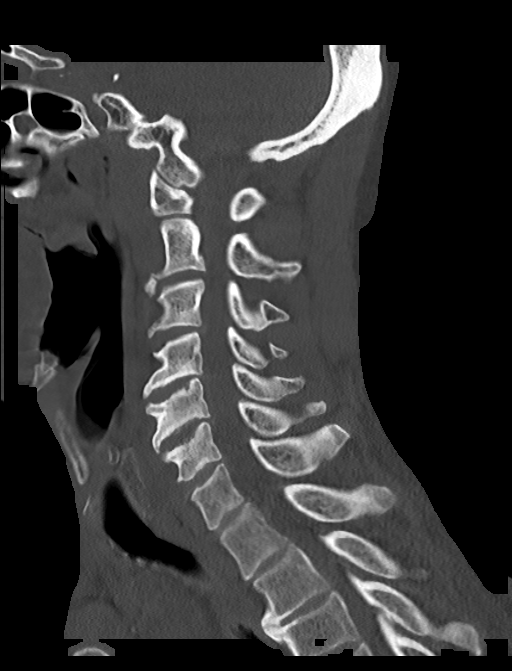
[im 29/43  bone]
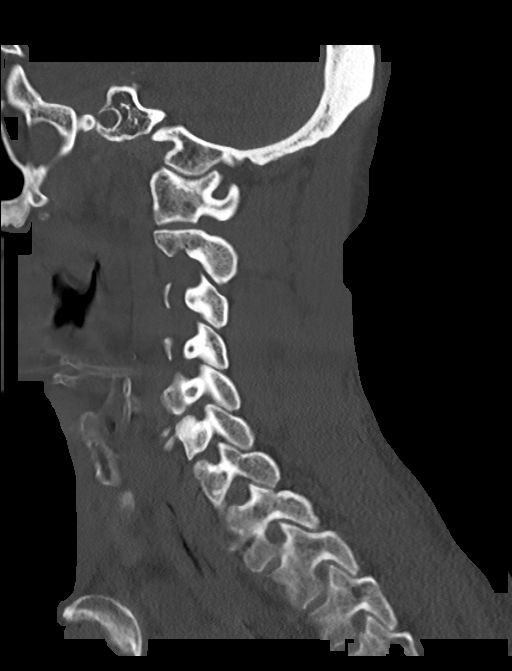

[Series 7: cor bone · coronal · 0.38mm/px · 3 of 61 slices shown]
[im 13/61  bone]
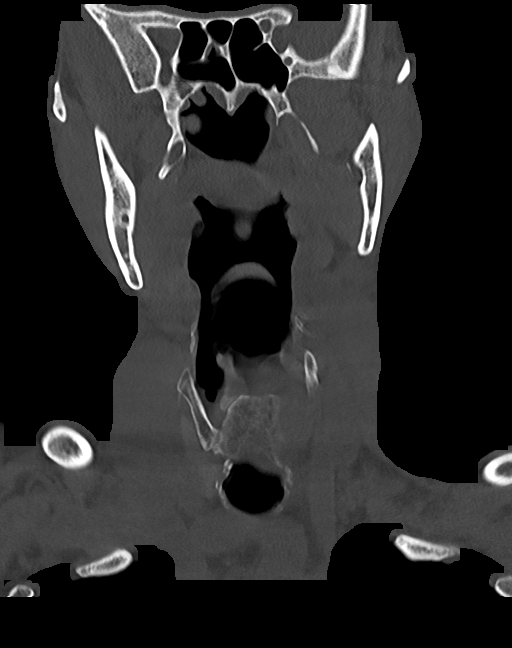
[im 25/61  bone]
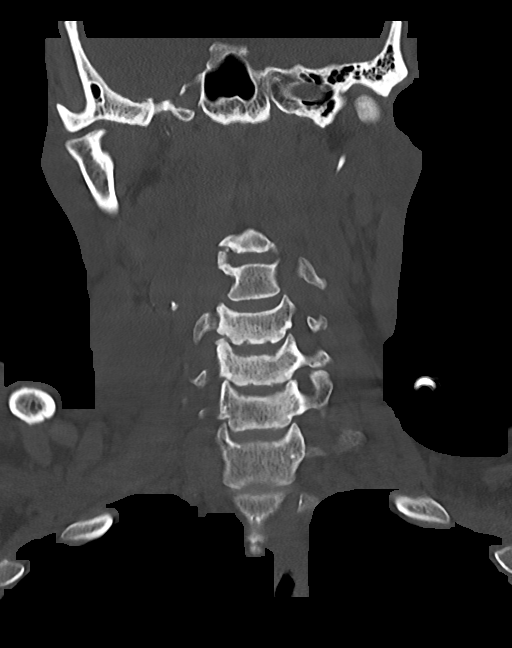
[im 37/61  bone]
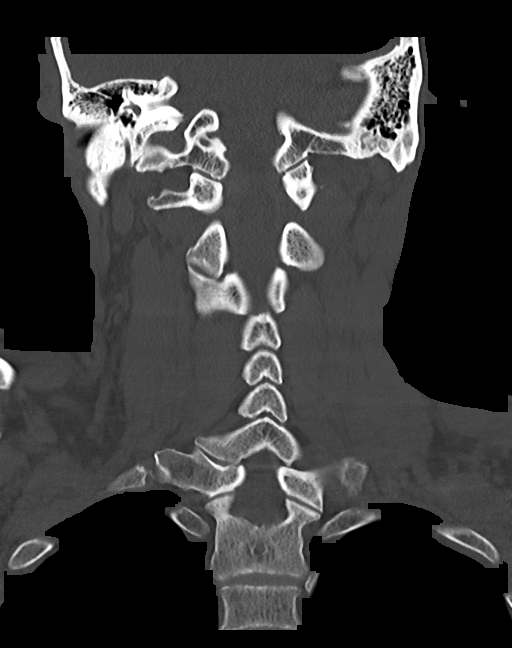

[Series 8: orthogonal axials · axial · 0.21mm/px · z∈[-713,-581]mm · 5 of 103 slices shown, 7 images]
[im 18/103  soft-tissue]
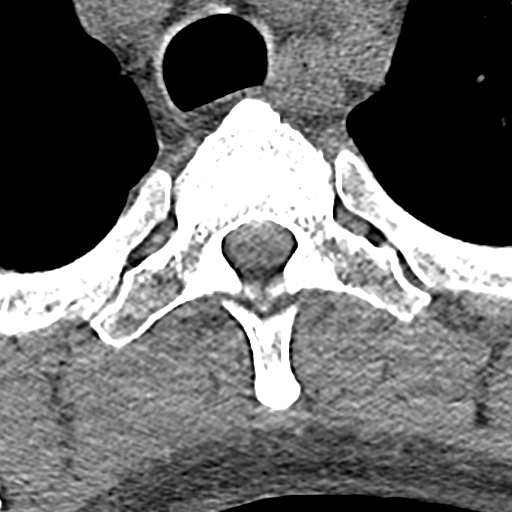
[im 18/103  bone]
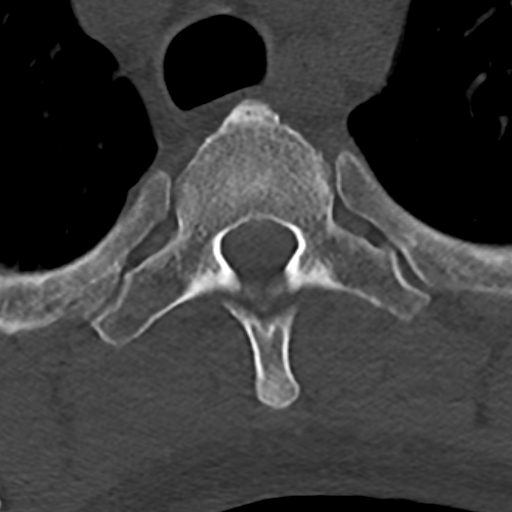
[im 35/103  bone]
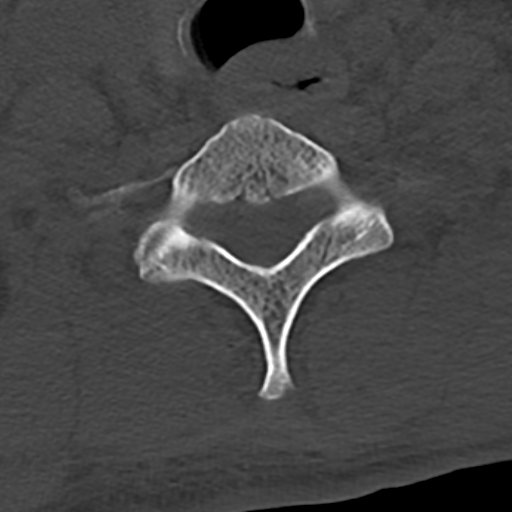
[im 52/103  bone]
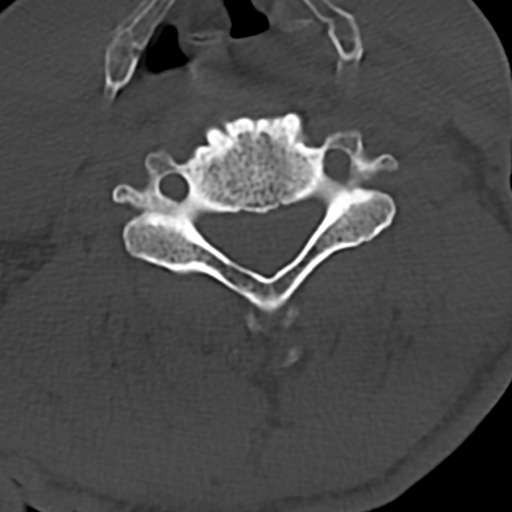
[im 69/103  bone]
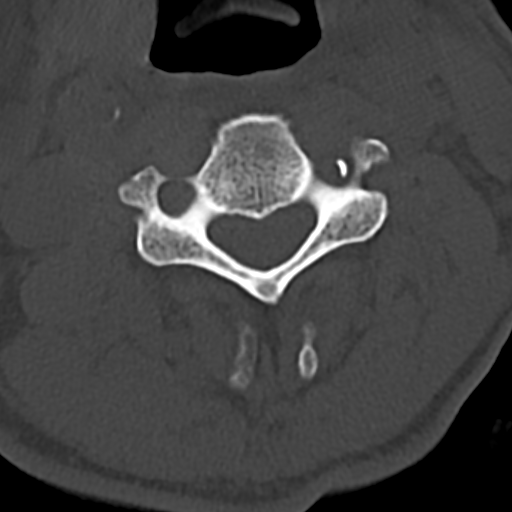
[im 86/103  soft-tissue]
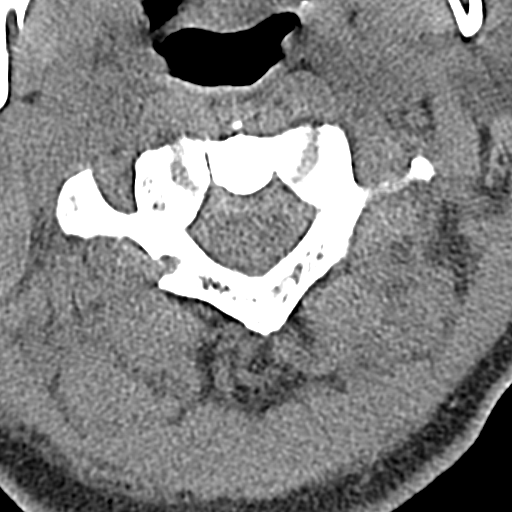
[im 86/103  bone]
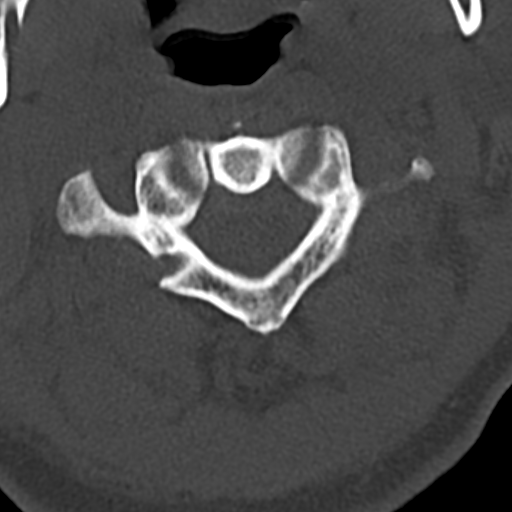

[13 of 33 positions shown; findings below may reference images not displayed]

FINDINGS: Alignment: Cervical vertebral bodies maintain alignment with no
subluxation, retrolisthesis, anterolisthesis.

Skull base and vertebrae: Craniocervical junction aligned. No
skullbase fracture identified.

Similar configuration of vertebral bodies compared to the MR with
mild body height loss of C4 and C5. No fracture line identified.

Soft tissues and spinal canal: No hyperdense material within the
spinal canal. Calcifications of the bilateral carotid system. No
focal fluid collection. Questionable trace low-density edema in the
retropharyngeal soft tissues, corresponds to findings on prior MRI.

Disc levels:

C2-C3: Posterior disc osteophyte complex with central posterior disc
protrusion appearing to contact the ventral aspect of the cord.
Uncovertebral joint disease contributes to minimal bilateral neural
foraminal narrowing.

C3-C4: Posterior disc osteophyte complex with right eccentric disc
bulge/protrusion appears to contact the ventral thecal sac.
Bilateral uncovertebral joint disease contributes to minimal neural
foraminal narrowing.

C4-C5: Posterior disc osteophyte complex with central disc
bulge/protrusion appears to contact the ventral thecal sac/cord.
Uncovertebral joint disease contributes to minimal bilateral neural
foraminal narrowing.

C5-C6:

Posterior disc osteophyte complex with left eccentric disc
bulge/protrusion appears to contact the left aspect of the thecal
sac. No significant neural foraminal narrowing.

C6-C7: Mild degenerative disc disease with posterior disc osteophyte
complex and no significant compression on the ventral thecal sac.

Upper chest: Unremarkable

Other: None
IMPRESSION: No CT evidence of acute fracture malalignment of the cervical spine.

Pronounced posterior disc osteophyte complex with central posterior
disc protrusion at the C2-C3 level (level of cord injury on prior
MR) contacts the ventral thecal sac/cord, contributing to acquired
stenosis at this level.

Posterior disc osteophyte complex at additional levels of C3-C4,
C4-C5, C5-C6 with associated central/ paracentral disc protrusion,
as above, also resulting in acquired stenosis.

Mild edema in the retropharyngeal region corresponds to findings on
prior MR, favored to represent soft tissue injury in the absence of
fracture.

Carotid vascular disease.

## 2019-09-15 IMAGING — CR DG CHEST 1V PORT
1 series · 1 of 1 positions shown · non-contrast
Comparison: Portable chest x-ray October 05, 2017

CLINICAL DATA: Mid chest pain. History of hypertension,
quadriplegia, schizophrenia.

EXAM:
PORTABLE CHEST 1 VIEW

[ap]
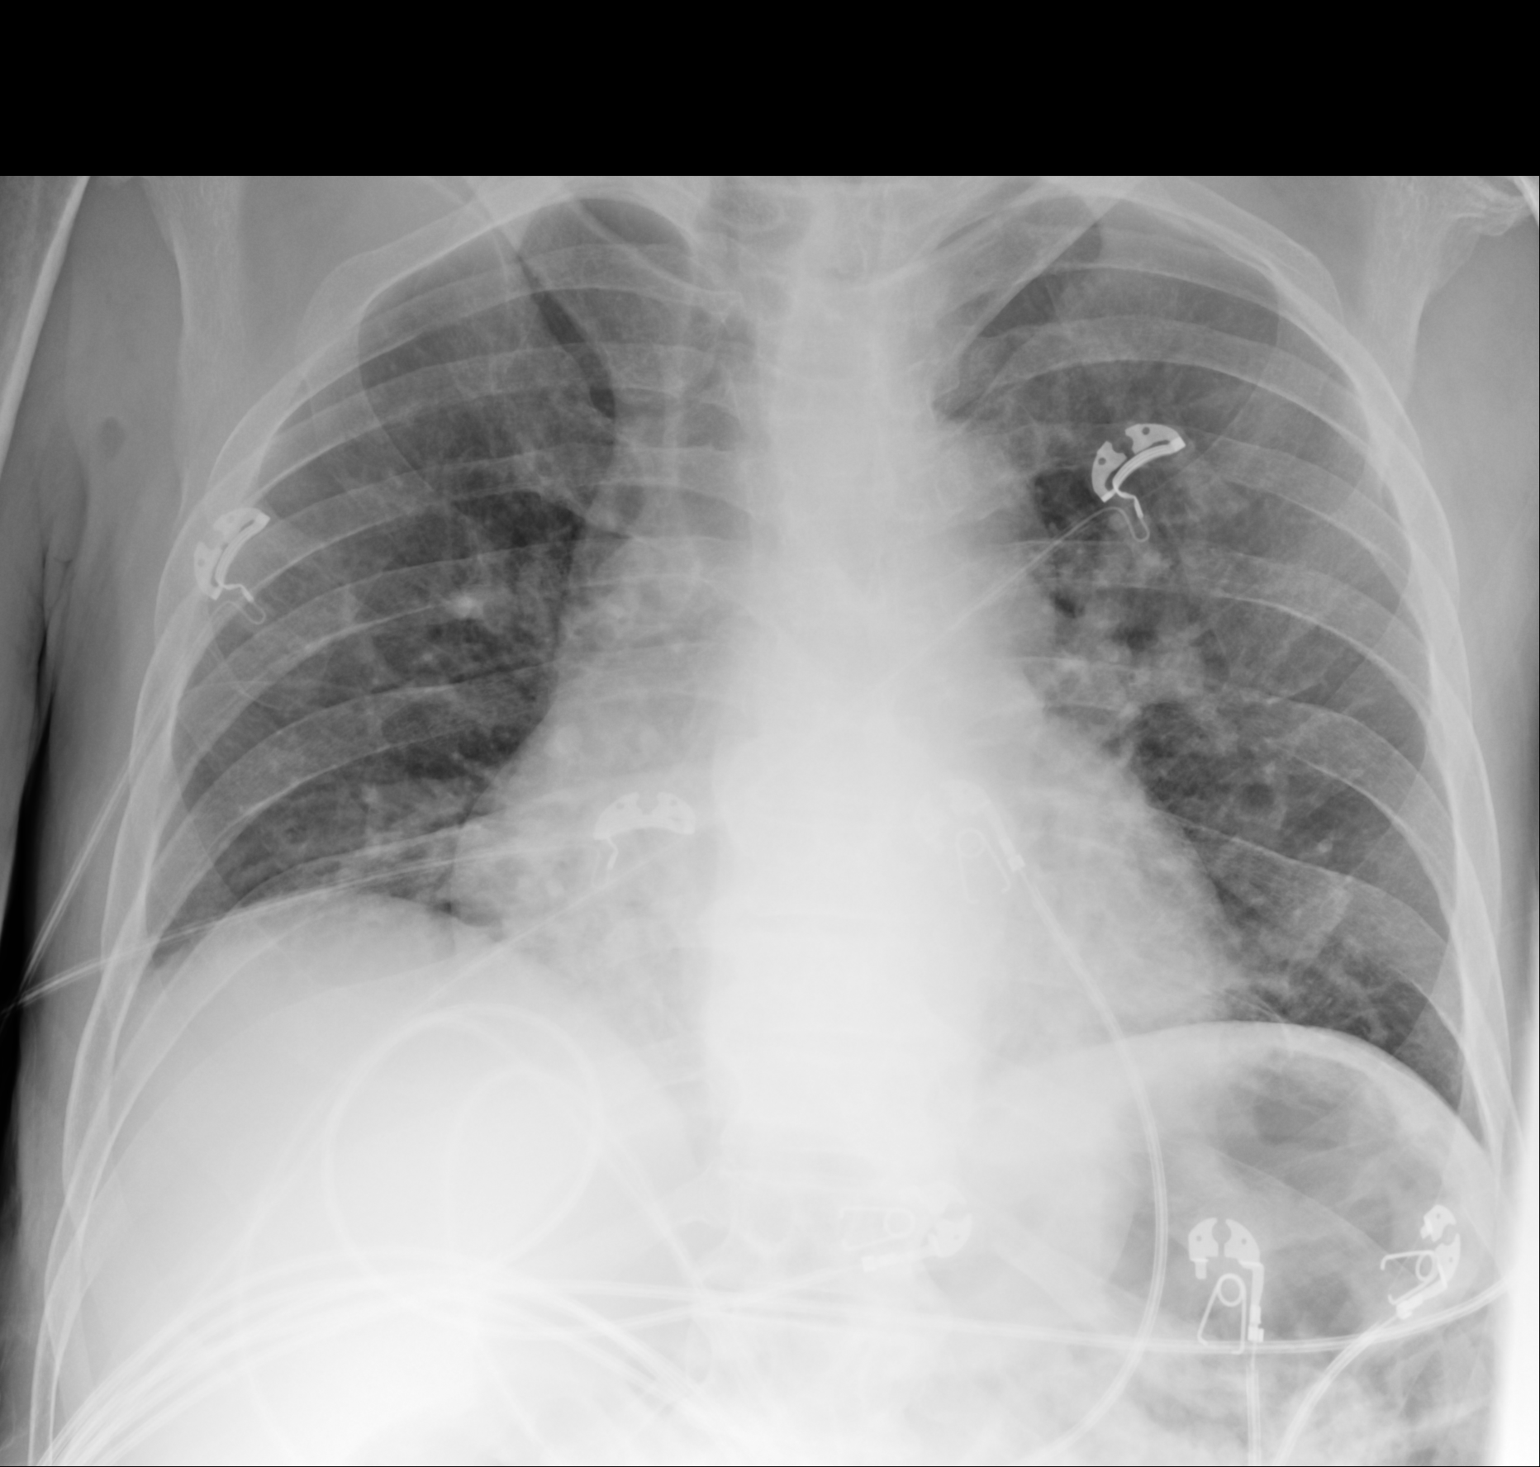

[1 of 1 positions shown; findings below may reference images not displayed]

FINDINGS: The lungs are borderline hypoinflated. There is no focal infiltrate.
There is no pleural effusion or pneumothorax. The cardiac silhouette
is enlarged but accentuated by the AP portable technique. The
pulmonary vascularity is prominent centrally. There is prominence of
the mediastinum which is accentuated by the portable technique as
well.
IMPRESSION: Cardiomegaly. Mild central pulmonary vascular congestion. No
definite pulmonary edema.
